# Patient Record
Sex: Female | Born: 1955 | Race: White | Hispanic: No | Marital: Single | Smoking: Never smoker
Health system: Southern US, Community
[De-identification: ages and names within clinical notes are randomized; demographics above are authoritative.]

## PROBLEM LIST (undated history)

## (undated) DIAGNOSIS — M1990 Unspecified osteoarthritis, unspecified site: Secondary | ICD-10-CM

## (undated) DIAGNOSIS — IMO0001 Reserved for inherently not codable concepts without codable children: Secondary | ICD-10-CM

## (undated) DIAGNOSIS — I498 Other specified cardiac arrhythmias: Secondary | ICD-10-CM

## (undated) DIAGNOSIS — G629 Polyneuropathy, unspecified: Secondary | ICD-10-CM

## (undated) DIAGNOSIS — I739 Peripheral vascular disease, unspecified: Secondary | ICD-10-CM

## (undated) DIAGNOSIS — I10 Essential (primary) hypertension: Secondary | ICD-10-CM

## (undated) DIAGNOSIS — G5620 Lesion of ulnar nerve, unspecified upper limb: Secondary | ICD-10-CM

## (undated) DIAGNOSIS — G4730 Sleep apnea, unspecified: Secondary | ICD-10-CM

## (undated) DIAGNOSIS — IMO0002 Reserved for concepts with insufficient information to code with codable children: Secondary | ICD-10-CM

## (undated) DIAGNOSIS — Z8619 Personal history of other infectious and parasitic diseases: Secondary | ICD-10-CM

## (undated) DIAGNOSIS — K3184 Gastroparesis: Secondary | ICD-10-CM

## (undated) DIAGNOSIS — G709 Myoneural disorder, unspecified: Secondary | ICD-10-CM

## (undated) DIAGNOSIS — E785 Hyperlipidemia, unspecified: Secondary | ICD-10-CM

## (undated) DIAGNOSIS — E113299 Type 2 diabetes mellitus with mild nonproliferative diabetic retinopathy without macular edema, unspecified eye: Secondary | ICD-10-CM

## (undated) DIAGNOSIS — G5600 Carpal tunnel syndrome, unspecified upper limb: Secondary | ICD-10-CM

## (undated) HISTORY — PX: BIOPSY THYROID: PRO38

## (undated) HISTORY — PX: TONSILLECTOMY: SUR1361

## (undated) HISTORY — DX: Gastroparesis: K31.84

## (undated) HISTORY — DX: Personal history of other infectious and parasitic diseases: Z86.19

## (undated) HISTORY — DX: Essential (primary) hypertension: I10

## (undated) HISTORY — DX: Polyneuropathy, unspecified: G62.9

## (undated) HISTORY — DX: Unspecified osteoarthritis, unspecified site: M19.90

## (undated) HISTORY — DX: Hyperlipidemia, unspecified: E78.5

## (undated) HISTORY — DX: Type 2 diabetes mellitus with mild nonproliferative diabetic retinopathy without macular edema, unspecified eye: E11.3299

## (undated) HISTORY — DX: Carpal tunnel syndrome, unspecified upper limb: G56.00

## (undated) HISTORY — DX: Lesion of ulnar nerve, unspecified upper limb: G56.20

## (undated) HISTORY — DX: Reserved for concepts with insufficient information to code with codable children: IMO0002

---

## 1997-12-28 ENCOUNTER — Other Ambulatory Visit: Admission: RE | Admit: 1997-12-28 | Discharge: 1997-12-28 | Payer: Self-pay | Admitting: Obstetrics and Gynecology

## 2003-08-31 ENCOUNTER — Ambulatory Visit (HOSPITAL_COMMUNITY): Admission: RE | Admit: 2003-08-31 | Discharge: 2003-08-31 | Payer: Self-pay | Admitting: Internal Medicine

## 2003-09-18 ENCOUNTER — Encounter: Admission: RE | Admit: 2003-09-18 | Discharge: 2003-09-18 | Payer: Self-pay | Admitting: Internal Medicine

## 2004-01-25 ENCOUNTER — Encounter: Admission: RE | Admit: 2004-01-25 | Discharge: 2004-01-25 | Payer: Self-pay | Admitting: Orthopedic Surgery

## 2010-09-23 ENCOUNTER — Encounter: Payer: Self-pay | Admitting: Orthopedic Surgery

## 2012-03-23 ENCOUNTER — Encounter: Payer: Self-pay | Admitting: Cardiovascular Disease

## 2012-03-27 ENCOUNTER — Encounter: Payer: Self-pay | Admitting: Cardiovascular Disease

## 2012-03-27 ENCOUNTER — Ambulatory Visit (INDEPENDENT_AMBULATORY_CARE_PROVIDER_SITE_OTHER): Payer: Self-pay | Admitting: Cardiovascular Disease

## 2012-03-27 VITALS — BP 157/91 | HR 101 | Ht 66.0 in | Wt 291.2 lb

## 2012-03-27 DIAGNOSIS — R0602 Shortness of breath: Secondary | ICD-10-CM | POA: Insufficient documentation

## 2012-03-27 DIAGNOSIS — R55 Syncope and collapse: Secondary | ICD-10-CM | POA: Insufficient documentation

## 2012-03-27 DIAGNOSIS — R002 Palpitations: Secondary | ICD-10-CM

## 2012-03-27 DIAGNOSIS — R609 Edema, unspecified: Secondary | ICD-10-CM | POA: Insufficient documentation

## 2012-03-27 DIAGNOSIS — R079 Chest pain, unspecified: Secondary | ICD-10-CM

## 2012-03-27 NOTE — Assessment & Plan Note (Signed)
Edema likely secondary to venous insufficiency. Unable to exclude elevated right ventricular systolic pressures. Echo pending. Right ventricular systolic pressure is elevated, she would benefit from a diuretic.

## 2012-03-27 NOTE — Patient Instructions (Signed)
You are doing well. No medication changes were made.  We will schedule you for an echocardiogram for dizziness, edema, shortness of breath We will order a holter monitor for 48 hrs for dizziness,  shortness of breath  Please call us if you have new issues that need to be addressed before your next appt.  Your physician wants you to follow-up in:  After the  And holter and echo

## 2012-03-27 NOTE — Progress Notes (Signed)
Patient ID: Linda Briggs, female    DOB: 06-07-1956, 56 y.o.   MRN: [id number]  HPI Comments: Linda Briggs is a very pleasant 56 year old woman with diabetes, obesity, hyperlipidemia, hypertension who presents by referral from Dr. Tanya Nones for symptoms of dizziness.   She reports that her symptoms have been ongoing for at least a year but getting worse recently. Symptoms used to occur once per month and now are happening once every other day. She has had episodes of dizziness when bending over and then she stands up. She has lightheadedness, flushing, a "tunnel vision". She has to sit down and then her symptoms eventually resolved. She has never had syncope but has often felt like she is going to pass out. She has checked her blood pressure, heart rate in sugar when she has these episodes and reports that typically they are within normal limits. She has had some shortness of breath with exertion. Symptoms are worse recently such as when she goes to get the mail. She denies any significant chest pain.   She reports hemoglobin A1c was 11 now down to 9.2.  Blood pressures in the office show blood pressure lying down 137/92, sitting 128/84, standing 137/82, after several minutes 157/91. Interestingly heart rate measurement appeared to be slow after she was standing and then increase to 101 beats per minute after 5 minutes   EKG shows normal sinus rhythm with rate 93 beats per minute with no significant ST or T wave changes   Outpatient Encounter Prescriptions as of 03/27/2012  Medication Sig Dispense Refill  . aspirin 325 MG tablet Take 325 mg by mouth daily.      . Cyanocobalamin (VITAMIN B 12 PO) Take 500 mcg by mouth daily.      Marland Kitchen gabapentin (NEURONTIN) 300 MG capsule Take 300 mg by mouth 3 (three) times daily.      Marland Kitchen glipiZIDE (GLUCOTROL XL) 10 MG 24 hr tablet Take 10 mg by mouth daily.      . insulin glargine (LANTUS) 100 UNIT/ML injection Inject into the skin at bedtime.      Marland Kitchen lisinopril  (PRINIVIL,ZESTRIL) 20 MG tablet Take 20 mg by mouth daily.      . metFORMIN (GLUCOPHAGE) 1000 MG tablet Take 1,000 mg by mouth 2 (two) times daily with a meal.      . Multiple Vitamin (MULTIVITAMIN) tablet Take 1 tablet by mouth daily.      . pravastatin (PRAVACHOL) 40 MG tablet Take 40 mg by mouth daily.        Review of Systems  Constitutional: Negative.   HENT: Negative.   Eyes: Negative.   Respiratory: Positive for shortness of breath.   Cardiovascular: Negative.   Gastrointestinal: Negative.   Musculoskeletal: Negative.   Skin: Negative.   Neurological: Positive for dizziness.  Hematological: Negative.   Psychiatric/Behavioral: Negative.   All other systems reviewed and are negative.    BP 157/91  Pulse 101  Ht 5\' 6"  (1.676 m)  Wt 291 lb 4 oz (132.11 kg)  BMI 47.01 kg/m2  Physical Exam  Nursing note and vitals reviewed. Constitutional: She is oriented to person, place, and time. She appears well-developed and well-nourished.  HENT:  Head: Normocephalic.  Nose: Nose normal.  Mouth/Throat: Oropharynx is clear and moist.  Eyes: Conjunctivae are normal. Pupils are equal, round, and reactive to light.  Neck: Normal range of motion. Neck supple. No JVD present.  Cardiovascular: Normal rate, regular rhythm, S1 normal, S2 normal, normal heart sounds and intact  distal pulses.  Exam reveals no gallop and no friction rub.   No murmur heard. Pulmonary/Chest: Effort normal and breath sounds normal. No respiratory distress. She has no wheezes. She has no rales. She exhibits no tenderness.  Abdominal: Soft. Bowel sounds are normal. She exhibits no distension. There is no tenderness.  Musculoskeletal: Normal range of motion. She exhibits no edema and no tenderness.  Lymphadenopathy:    She has no cervical adenopathy.  Neurological: She is alert and oriented to person, place, and time. Coordination normal.  Skin: Skin is warm and dry. No rash noted. No erythema.  Psychiatric: She  has a normal mood and affect. Her behavior is normal. Judgment and thought content normal.         Assessment and Plan       the

## 2012-03-27 NOTE — Assessment & Plan Note (Signed)
Etiology of her shortness of breath is uncertain. Unable to rule out arrhythmia. Also need to consider ulnar hypertension given her edema. Echocardiogram has been ordered. If this is normal, we could consider a stress test. She has several risk factors including diabetes.

## 2012-03-27 NOTE — Assessment & Plan Note (Signed)
Etiology of her near syncope is uncertain. It is predominantly positional in nature, worse after bending over. We have asked her to closely monitor her blood pressure. Hold for pending to rule out arrhythmia. Echo pending to rule out structural heart disease.

## 2012-03-27 NOTE — Assessment & Plan Note (Signed)
Etiology of her tachycardia is uncertain. Holter pending. Heart rate is elevated at baseline. She may benefit from low-dose beta blocker.

## 2012-03-30 ENCOUNTER — Encounter (INDEPENDENT_AMBULATORY_CARE_PROVIDER_SITE_OTHER): Payer: Self-pay

## 2012-03-30 DIAGNOSIS — R0602 Shortness of breath: Secondary | ICD-10-CM

## 2012-03-30 DIAGNOSIS — R55 Syncope and collapse: Secondary | ICD-10-CM

## 2012-03-30 DIAGNOSIS — R609 Edema, unspecified: Secondary | ICD-10-CM

## 2012-03-30 DIAGNOSIS — R002 Palpitations: Secondary | ICD-10-CM

## 2012-04-07 ENCOUNTER — Other Ambulatory Visit (INDEPENDENT_AMBULATORY_CARE_PROVIDER_SITE_OTHER): Payer: Self-pay

## 2012-04-07 ENCOUNTER — Other Ambulatory Visit: Payer: Self-pay

## 2012-04-07 DIAGNOSIS — R55 Syncope and collapse: Secondary | ICD-10-CM

## 2012-04-07 DIAGNOSIS — R002 Palpitations: Secondary | ICD-10-CM

## 2012-04-07 DIAGNOSIS — R609 Edema, unspecified: Secondary | ICD-10-CM

## 2012-04-07 DIAGNOSIS — R0602 Shortness of breath: Secondary | ICD-10-CM

## 2012-04-13 ENCOUNTER — Ambulatory Visit (INDEPENDENT_AMBULATORY_CARE_PROVIDER_SITE_OTHER): Payer: Self-pay | Admitting: Cardiovascular Disease

## 2012-04-13 ENCOUNTER — Encounter: Payer: Self-pay | Admitting: Cardiovascular Disease

## 2012-04-13 VITALS — BP 148/86 | HR 84 | Ht 66.0 in | Wt 297.0 lb

## 2012-04-13 DIAGNOSIS — R609 Edema, unspecified: Secondary | ICD-10-CM

## 2012-04-13 DIAGNOSIS — R002 Palpitations: Secondary | ICD-10-CM

## 2012-04-13 DIAGNOSIS — R0602 Shortness of breath: Secondary | ICD-10-CM

## 2012-04-13 DIAGNOSIS — R079 Chest pain, unspecified: Secondary | ICD-10-CM

## 2012-04-13 DIAGNOSIS — R55 Syncope and collapse: Secondary | ICD-10-CM

## 2012-04-13 MED ORDER — METOPROLOL TARTRATE 25 MG PO TABS: 25.0000 mg | ORAL_TABLET | Freq: Two times a day (BID) | ORAL | Status: DC

## 2012-04-13 NOTE — Assessment & Plan Note (Signed)
Edema likely from venous insufficiency. She will look at medical supply stores for thigh high compression stockings.

## 2012-04-13 NOTE — Patient Instructions (Addendum)
Please start metoprolol one in the AM and PM for fast heart rate  Take an extra metoprolol as needed  Please call us if you have new issues that need to be addressed before your next appt.  Your physician wants you to follow-up in: 3 months.  You will receive a reminder letter in the mail two months in advance. If you don't receive a letter, please call our office to schedule the follow-up appointment.

## 2012-04-13 NOTE — Assessment & Plan Note (Signed)
Symptoms possibly secondary to tachyarrhythmia. We will start low-dose metoprolol twice a day for symptom release, this can be titrated upwards as needed. 30 day monitor if needed for continued symptoms.

## 2012-04-13 NOTE — Assessment & Plan Note (Signed)
We will start low-dose metoprolol tartrate twice a day for elevated heart rate and tachycardia. If symptoms do not significantly improve, we will order a 30 day monitor

## 2012-04-13 NOTE — Assessment & Plan Note (Signed)
Shortness of breath could be secondary to baseline tachycardia with minimal exertion.

## 2012-04-13 NOTE — Progress Notes (Signed)
Patient ID: Linda Briggs, female    DOB: 07-Nov-1955, 56 y.o.   MRN: [id number]  HPI Comments: Linda Briggs is a very pleasant 56 year old woman with diabetes, obesity, hyperlipidemia, hypertension, patient of Dr. Tanya Nones, with symptoms of dizziness. Recently started on insulin, also with chronic lower extremity edema.  She reports that she continues to have symptoms of dizziness that come episodically. Shortness of breath with exertion, periods of palpitations.   She wore the Holter monitor that showed elevated baseline heart rate with frequent periods of sinus tachycardia.  she reports that she was relatively asymptomatic while wearing the monitor as she did not have some of her usual episodes of dizziness and fluttering.  She reports hemoglobin A1c was 11 now down to 9.2. She has been recently started on insulin and reports even better sugar levels She has neuropathy in her feet.   EKG shows normal sinus rhythm with rate 84 beats per minute with no significant ST or T wave changes   Outpatient Encounter Prescriptions as of 04/13/2012  Medication Sig Dispense Refill  . aspirin 325 MG tablet Take 325 mg by mouth daily.      . Cyanocobalamin (VITAMIN B 12 PO) Take 500 mcg by mouth daily.      Marland Kitchen gabapentin (NEURONTIN) 300 MG capsule Take 300 mg by mouth 3 (three) times daily.      Marland Kitchen glipiZIDE (GLUCOTROL XL) 10 MG 24 hr tablet Take 10 mg by mouth daily.      . insulin glargine (LANTUS) 100 UNIT/ML injection Inject 40 Units into the skin at bedtime.       Marland Kitchen lisinopril (PRINIVIL,ZESTRIL) 20 MG tablet Take 20 mg by mouth daily.      . metFORMIN (GLUCOPHAGE) 1000 MG tablet Take 1,000 mg by mouth 2 (two) times daily with a meal.      . Multiple Vitamin (MULTIVITAMIN) tablet Take 1 tablet by mouth daily.      . pravastatin (PRAVACHOL) 40 MG tablet Take 40 mg by mouth daily.      . metoprolol tartrate (LOPRESSOR) 25 MG tablet Take 1 tablet (25 mg total) by mouth 2 (two) times daily.  60 tablet  6     Review of Systems  Constitutional: Negative.   HENT: Negative.   Eyes: Negative.   Respiratory: Positive for shortness of breath.   Cardiovascular: Negative.   Gastrointestinal: Negative.   Musculoskeletal: Negative.   Skin: Negative.   Neurological: Positive for dizziness.  Hematological: Negative.   Psychiatric/Behavioral: Negative.   All other systems reviewed and are negative.    BP 148/86  Pulse 84  Ht 5\' 6"  (1.676 m)  Wt 297 lb (134.718 kg)  BMI 47.94 kg/m2  Physical Exam  Nursing note and vitals reviewed. Constitutional: She is oriented to person, place, and time. She appears well-developed and well-nourished.  HENT:  Head: Normocephalic.  Nose: Nose normal.  Mouth/Throat: Oropharynx is clear and moist.  Eyes: Conjunctivae are normal. Pupils are equal, round, and reactive to light.  Neck: Normal range of motion. Neck supple. No JVD present.  Cardiovascular: Normal rate, regular rhythm, S1 normal, S2 normal, normal heart sounds and intact distal pulses.  Exam reveals no gallop and no friction rub.   No murmur heard. Pulmonary/Chest: Effort normal and breath sounds normal. No respiratory distress. She has no wheezes. She has no rales. She exhibits no tenderness.  Abdominal: Soft. Bowel sounds are normal. She exhibits no distension. There is no tenderness.  Musculoskeletal: Normal range of motion.  She exhibits no edema and no tenderness.  Lymphadenopathy:    She has no cervical adenopathy.  Neurological: She is alert and oriented to person, place, and time. Coordination normal.  Skin: Skin is warm and dry. No rash noted. No erythema.  Psychiatric: She has a normal mood and affect. Her behavior is normal. Judgment and thought content normal.         Assessment and Plan

## 2012-07-13 ENCOUNTER — Ambulatory Visit: Payer: Self-pay | Admitting: Cardiovascular Disease

## 2012-08-19 ENCOUNTER — Ambulatory Visit: Payer: Self-pay | Admitting: Cardiovascular Disease

## 2012-09-07 ENCOUNTER — Encounter: Payer: Self-pay | Admitting: Cardiovascular Disease

## 2012-09-07 ENCOUNTER — Ambulatory Visit (INDEPENDENT_AMBULATORY_CARE_PROVIDER_SITE_OTHER): Payer: Self-pay | Admitting: Cardiovascular Disease

## 2012-09-07 VITALS — BP 146/70 | HR 94 | Ht 66.0 in | Wt 299.0 lb

## 2012-09-07 DIAGNOSIS — R0602 Shortness of breath: Secondary | ICD-10-CM

## 2012-09-07 DIAGNOSIS — R079 Chest pain, unspecified: Secondary | ICD-10-CM

## 2012-09-07 DIAGNOSIS — R609 Edema, unspecified: Secondary | ICD-10-CM

## 2012-09-07 DIAGNOSIS — R002 Palpitations: Secondary | ICD-10-CM

## 2012-09-07 MED ORDER — POTASSIUM CHLORIDE ER 10 MEQ PO TBCR: 10.0000 meq | EXTENDED_RELEASE_TABLET | Freq: Two times a day (BID) | ORAL | Status: DC | PRN

## 2012-09-07 MED ORDER — FUROSEMIDE 20 MG PO TABS: 20.0000 mg | ORAL_TABLET | Freq: Two times a day (BID) | ORAL | Status: DC | PRN

## 2012-09-07 NOTE — Assessment & Plan Note (Signed)
Of shortness of breath, possible diastolic dysfunction/diastolic CHF. Worsening edema. We will start low-dose diuretic.

## 2012-09-07 NOTE — Patient Instructions (Addendum)
You are doing well. Try 1 1/2 or  two metoprolol in the Am, one in the PM to control the heart rate  Take lasix up to twice a day with potassium for swelling /fluid retention   Please call us if you have new issues that need to be addressed before your next appt.  Your physician wants you to follow-up in: 6 months.  You will receive a reminder letter in the mail two months in advance. If you don't receive a letter, please call our office to schedule the follow-up appointment.

## 2012-09-07 NOTE — Assessment & Plan Note (Signed)
Improvement in her palpitations and fluttering and dizziness on metoprolol 25 mg twice a day. We have suggested she could increase the dose to 37.5 mg twice a day, possibly titrating up to 50 mg for better rate control.

## 2012-09-07 NOTE — Assessment & Plan Note (Signed)
Worsening edema through the holiday. We will start her on Lasix with low-dose potassium for increasing tightness in the feet and ankles. We have asked her to watch her salt and fluid intake. She does report having significant fluids and eating out.

## 2012-09-07 NOTE — Progress Notes (Signed)
Patient ID: Linda Briggs, female    DOB: 1955/12/29, 57 y.o.   MRN: [id number]  HPI Comments: Linda Briggs is a very pleasant 57 year old woman with diabetes, obesity, hyperlipidemia, hypertension, patient of Dr. Tanya Nones, with previous symptoms of dizziness.  on insulin, also with chronic lower extremity edema.  Overall she reports doing well though her edema has been worse. She has a difficult time putting her shoes on through the holiday time. The tops of her feet and toes are shiny and tight.  She does report hemoglobin A1c has been slowly decreasing, initially 11, down to 9, most recently 8 She has not been taking pravastatin on a regular basis  She wore  Holter monitor in the past that showed elevated baseline heart rate with frequent periods of sinus tachycardia. Improvement in dizzy episodes and fluttering with metoprolol. She has neuropathy in her feet.   EKG shows normal sinus rhythm with rate 94 beats per minute with no significant ST or T wave changes   Outpatient Encounter Prescriptions as of 09/07/2012  Medication Sig Dispense Refill  . aspirin 325 MG tablet Take 325 mg by mouth daily.      . Cyanocobalamin (VITAMIN B 12 PO) Take 500 mcg by mouth daily.      Marland Kitchen gabapentin (NEURONTIN) 300 MG capsule Take 300 mg by mouth 3 (three) times daily.      Marland Kitchen glipiZIDE (GLUCOTROL XL) 10 MG 24 hr tablet Take 10 mg by mouth daily.      . insulin NPH-insulin regular (NOVOLIN 70/30) (70-30) 100 UNIT/ML injection Inject 28 Units into the skin daily with breakfast. 14 units in the pm      . lisinopril (PRINIVIL,ZESTRIL) 20 MG tablet Take 20 mg by mouth daily.      . metFORMIN (GLUCOPHAGE) 1000 MG tablet Take 1,000 mg by mouth 2 (two) times daily with a meal.      . metoprolol tartrate (LOPRESSOR) 25 MG tablet Take 1 tablet (25 mg total) by mouth 2 (two) times daily.  60 tablet  6  . Multiple Vitamin (MULTIVITAMIN) tablet Take 1 tablet by mouth daily.      . pravastatin (PRAVACHOL) 80 MG tablet  Take 80 mg by mouth daily.      . traMADol (ULTRAM) 50 MG tablet Take 50 mg by mouth every 6 (six) hours as needed.      . furosemide (LASIX) 20 MG tablet Take 1 tablet (20 mg total) by mouth 2 (two) times daily as needed.  60 tablet  6  . potassium chloride (K-DUR) 10 MEQ tablet Take 1 tablet (10 mEq total) by mouth 2 (two) times daily as needed.  60 tablet  6  . [DISCONTINUED] insulin glargine (LANTUS) 100 UNIT/ML injection Inject 40 Units into the skin at bedtime.       . [DISCONTINUED] pravastatin (PRAVACHOL) 40 MG tablet Take 40 mg by mouth daily.        Review of Systems  Constitutional: Negative.   HENT: Negative.   Eyes: Negative.   Respiratory: Positive for shortness of breath.   Cardiovascular: Positive for leg swelling.  Gastrointestinal: Negative.   Musculoskeletal: Negative.   Skin: Negative.   Hematological: Negative.   Psychiatric/Behavioral: Negative.   All other systems reviewed and are negative.    BP 146/70  Pulse 94  Ht 5\' 6"  (1.676 m)  Wt 299 lb (135.626 kg)  BMI 48.26 kg/m2  Physical Exam  Nursing note and vitals reviewed. Constitutional: She is oriented to person,  place, and time. She appears well-developed and well-nourished.  HENT:  Head: Normocephalic.  Nose: Nose normal.  Mouth/Throat: Oropharynx is clear and moist.  Eyes: Conjunctivae normal are normal. Pupils are equal, round, and reactive to light.  Neck: Normal range of motion. Neck supple. No JVD present.  Cardiovascular: Normal rate, regular rhythm, S1 normal, S2 normal, normal heart sounds and intact distal pulses.  Exam reveals no gallop and no friction rub.   No murmur heard.      Woody edema bilaterally below the knees, trace to 1+ pitting  Pulmonary/Chest: Effort normal and breath sounds normal. No respiratory distress. She has no wheezes. She has no rales. She exhibits no tenderness.  Abdominal: Soft. Bowel sounds are normal. She exhibits no distension. There is no tenderness.    Musculoskeletal: Normal range of motion. She exhibits no edema and no tenderness.  Lymphadenopathy:    She has no cervical adenopathy.  Neurological: She is alert and oriented to person, place, and time. Coordination normal.  Skin: Skin is warm and dry. No rash noted. No erythema.  Psychiatric: She has a normal mood and affect. Her behavior is normal. Judgment and thought content normal.         Assessment and Plan

## 2013-01-11 ENCOUNTER — Ambulatory Visit: Payer: Self-pay

## 2013-03-15 ENCOUNTER — Other Ambulatory Visit: Payer: Self-pay | Admitting: Family Medicine

## 2013-04-06 ENCOUNTER — Telehealth: Payer: Self-pay | Admitting: *Deleted

## 2013-04-06 NOTE — Telephone Encounter (Signed)
Lmom to call [facility] need to r/s an overdue appt with Dr. Mariah Milling.

## 2013-04-12 ENCOUNTER — Ambulatory Visit: Payer: Self-pay | Admitting: Family Medicine

## 2013-04-26 ENCOUNTER — Ambulatory Visit (INDEPENDENT_AMBULATORY_CARE_PROVIDER_SITE_OTHER): Payer: Self-pay | Admitting: Family Medicine

## 2013-04-26 ENCOUNTER — Other Ambulatory Visit: Payer: Self-pay | Admitting: Family Medicine

## 2013-04-26 ENCOUNTER — Encounter: Payer: Self-pay | Admitting: Family Medicine

## 2013-04-26 VITALS — BP 122/76 | HR 84 | Temp 98.4°F | Resp 18 | Wt 308.0 lb

## 2013-04-26 DIAGNOSIS — E785 Hyperlipidemia, unspecified: Secondary | ICD-10-CM

## 2013-04-26 DIAGNOSIS — R002 Palpitations: Secondary | ICD-10-CM

## 2013-04-26 DIAGNOSIS — I10 Essential (primary) hypertension: Secondary | ICD-10-CM

## 2013-04-26 DIAGNOSIS — IMO0002 Reserved for concepts with insufficient information to code with codable children: Secondary | ICD-10-CM

## 2013-04-26 DIAGNOSIS — E1165 Type 2 diabetes mellitus with hyperglycemia: Secondary | ICD-10-CM

## 2013-04-26 LAB — HEMOGLOBIN A1C
Hgb A1c MFr Bld: 8.1 % — ABNORMAL HIGH (ref <5.7)
Mean Plasma Glucose: 186 mg/dL — ABNORMAL HIGH (ref <117)

## 2013-04-26 LAB — CBC WITH DIFFERENTIAL/PLATELET
Basophils Absolute: 0.1 10*3/uL (ref 0.0–0.1)
Basophils Relative: 1 % (ref 0–1)
Eosinophils Absolute: 0.2 10*3/uL (ref 0.0–0.7)
Eosinophils Relative: 2 % (ref 0–5)
HCT: 40.1 % (ref 36.0–46.0)
Hemoglobin: 14 g/dL (ref 12.0–15.0)
Lymphocytes Relative: 22 % (ref 12–46)
Lymphs Abs: 2.4 10*3/uL (ref 0.7–4.0)
MCH: 32.3 pg (ref 26.0–34.0)
MCHC: 34.9 g/dL (ref 30.0–36.0)
MCV: 92.6 fL (ref 78.0–100.0)
Monocytes Absolute: 0.5 10*3/uL (ref 0.1–1.0)
Monocytes Relative: 5 % (ref 3–12)
Neutro Abs: 7.7 10*3/uL (ref 1.7–7.7)
Neutrophils Relative %: 70 % (ref 43–77)
Platelets: 180 10*3/uL (ref 150–400)
RBC: 4.33 MIL/uL (ref 3.87–5.11)
RDW: 14 % (ref 11.5–15.5)
WBC: 10.9 10*3/uL — ABNORMAL HIGH (ref 4.0–10.5)

## 2013-04-26 LAB — COMPLETE METABOLIC PANEL WITH GFR
ALT: 21 U/L (ref 0–35)
AST: 20 U/L (ref 0–37)
Albumin: 3.9 g/dL (ref 3.5–5.2)
Alkaline Phosphatase: 83 U/L (ref 39–117)
BUN: 15 mg/dL (ref 6–23)
CO2: 28 mEq/L (ref 19–32)
Calcium: 9.3 mg/dL (ref 8.4–10.5)
Chloride: 100 mEq/L (ref 96–112)
Creat: 0.76 mg/dL (ref 0.50–1.10)
GFR, Est African American: >89 mL/min
GFR, Est Non African American: 89 mL/min
Glucose, Bld: 181 mg/dL — ABNORMAL HIGH (ref 70–99)
Potassium: 4.4 mEq/L (ref 3.5–5.3)
Sodium: 138 mEq/L (ref 135–145)
Total Bilirubin: 0.8 mg/dL (ref 0.3–1.2)
Total Protein: 6.8 g/dL (ref 6.0–8.3)

## 2013-04-26 MED ORDER — TRAMADOL HCL 50 MG PO TABS: 50.0000 mg | ORAL_TABLET | Freq: Four times a day (QID) | ORAL | Status: DC | PRN

## 2013-04-26 NOTE — Progress Notes (Signed)
Subjective:    Patient ID: Linda Briggs, female    DOB: 1955/11/22, 57 y.o.   MRN: [id number]  HPI  Position of her followup of her diabetes. She is currently on glipizide XL 10 mg by mouth daily, metformin 1000 mg by mouth twice a day, and NPH 70/30 35 units in the morning and 18 units at night. She reports fasting blood sugars typically between 101 170. She denies any episodes of hypoglycemia. She denies any polyuria, polydipsia, or blurred vision. She also has hypertension. She is going on Lopressor 25 mg by mouth twice a day, lisinopril 20 mg by mouth daily, and Lasix 20 mg by mouth daily. She denies any chest pain, shortness of breath, dyspnea on exertion. She also has hyperlipidemia issues going on pravastatin 40 mg by mouth daily. She denies any myalgia or right upper quadrant pain. She has chronic low back pain. Past Medical History  Diagnosis Date  . Diabetes mellitus   . History of hepatitis B   . Neuropathy     feet  . Bulging disc   . Arthritis     feet & toes  . Hyperlipidemia   . Hypertension    Past Surgical History  Procedure Laterality Date  . Tonsillectomy     Current Outpatient Prescriptions on File Prior to Visit  Medication Sig Dispense Refill  . aspirin 325 MG tablet Take 325 mg by mouth daily.      . Cyanocobalamin (VITAMIN B 12 PO) Take 500 mcg by mouth daily.      . furosemide (LASIX) 20 MG tablet Take 1 tablet (20 mg total) by mouth 2 (two) times daily as needed.  60 tablet  6  . gabapentin (NEURONTIN) 300 MG capsule Take 300 mg by mouth 3 (three) times daily.      Marland Kitchen glipiZIDE (GLUCOTROL XL) 10 MG 24 hr tablet TAKE 1 TABLET BY MOUTH ONCE A DAY  30 tablet  3  . insulin NPH-insulin regular (NOVOLIN 70/30) (70-30) 100 UNIT/ML injection Inject 35 Units into the skin daily with breakfast. 18 units in the pm      . lisinopril (PRINIVIL,ZESTRIL) 20 MG tablet TAKE 1 TABLET BY MOUTH ONCE A DAY  30 tablet  3  . metFORMIN (GLUCOPHAGE) 1000 MG tablet TAKE 1 TABLET BY  MOUTH TWICE A DAY  60 tablet  3  . Multiple Vitamin (MULTIVITAMIN) tablet Take 1 tablet by mouth daily.      . pravastatin (PRAVACHOL) 80 MG tablet Take 80 mg by mouth daily.      . traMADol (ULTRAM) 50 MG tablet Take 50 mg by mouth every 6 (six) hours as needed.       No current facility-administered medications on file prior to visit.   Allergies  Allergen Reactions  . Codeine   . Januvia [Sitagliptin]     Stomach cramps  . Penicillins    History   Social History  . Marital Status: Married    Spouse Name: N/A    Number of Children: N/A  . Years of Education: N/A   Occupational History  . Not on file.   Social History Main Topics  . Smoking status: Never Smoker   . Smokeless tobacco: Never Used  . Alcohol Use: 0.0 oz/week     Comment: social drinker.  . Drug Use: No  . Sexual Activity: Not on file   Other Topics Concern  . Not on file   Social History Narrative  . No narrative on file  Family History  Problem Relation Age of Onset  . Hyperlipidemia Mother   . Heart disease Father   . Hyperlipidemia Father   . Hypertension Father   . Heart disease Brother   . Stroke Brother      Review of Systems  All other systems reviewed and are negative.       Objective:   Physical Exam  Constitutional: She appears well-developed and well-nourished.  Eyes: Conjunctivae are normal. Pupils are equal, round, and reactive to light.  Neck: Neck supple. No JVD present. No thyromegaly present.  Cardiovascular: S1 normal, S2 normal and intact distal pulses.  An irregular rhythm present. PMI is not displaced.  Exam reveals no gallop.   Murmur heard.  Systolic murmur is present with a grade of 1/6  Pulmonary/Chest: Breath sounds normal. No respiratory distress. She has no wheezes. She has no rales. She exhibits no tenderness.  Abdominal: Soft. Bowel sounds are normal. She exhibits no distension. There is no tenderness. There is no rebound and no guarding.  Musculoskeletal:  She exhibits edema.  Lymphadenopathy:    She has no cervical adenopathy.  Skin: No rash noted.          Assessment & Plan:  1. Palpitations Patient's exam today showed an irregular heartbeat. Cannot discern whether they are PACs/PVCs versus atrial fibrillation. Therefore I obtained an EKG. EKG today in the office shows normal sinus rhythm 88 beats per minute with left axis deviation 28. There no intervals. She has nonspecific ST changes in aVL. Otherwise EKG is essentially normal. There is no evidence of a fibrillation. - EKG 12-Lead  2. Type II or unspecified type diabetes mellitus with unspecified complication, uncontrolled Check the patient's hemoglobin A1c. I recommended an annual eye exam. She is going on aspirin therapy. Also check a fasting lipid panel. LDL is less than 100. I like her hemoglobin A1c less than 7. Check that today. I recommended a low carbohydrate diet and increasing aerobic exercise. - COMPLETE METABOLIC PANEL WITH GFR - CBC with Differential - Hemoglobin A1c - Lipid panel; Future  3. HTN (hypertension) Pressures well controlled. Continue current medications at the present dosages.  4. HLD (hyperlipidemia) Return fasting for a fasting lipid panel. Goal LDL is less than 100. - Lipid panel; Future  Patient declines a colonoscopy due to a previous family history of perforated viscus. She will consider it but she's not interested at the present time.

## 2013-04-29 LAB — LIPID PANEL
Cholesterol: 184 mg/dL (ref 0–200)
HDL: 27 mg/dL — ABNORMAL LOW (ref >39)
LDL Cholesterol: 111 mg/dL — ABNORMAL HIGH (ref 0–99)
Total CHOL/HDL Ratio: 6.8 Ratio
Triglycerides: 229 mg/dL — ABNORMAL HIGH (ref <150)
VLDL: 46 mg/dL — ABNORMAL HIGH (ref 0–40)

## 2013-04-30 ENCOUNTER — Telehealth: Payer: Self-pay | Admitting: Family Medicine

## 2013-04-30 NOTE — Telephone Encounter (Signed)
Patient dose not want to try Lipitor.  Will stay on Pravstatin 80mg  at bedtime.  Reinforce watch diet (carbs and saturated fats)  Sending carb counting info sheet.  Has 3 mth appt for early December

## 2013-04-30 NOTE — Telephone Encounter (Signed)
Message copied by Donne Anon on Fri Apr 30, 2013 10:26 AM ------      Message from: Lynnea Ferrier      Created: Fri Apr 30, 2013  7:41 AM       LDL is 111, goal less than 100.  WOuld she be willing to switch pravastatin 80 qd to lipitor 40 qd and recheck in 3 months. ------

## 2013-05-17 ENCOUNTER — Telehealth: Payer: Self-pay | Admitting: Family Medicine

## 2013-05-17 NOTE — Telephone Encounter (Signed)
Date Fasting After Meal 8/28 155  175 8/29 212  145 8/30 152  182  8/31 137  285 9/1 136  249 9/2 179  234 9/3 192  Forgot to take 9/4 186  205

## 2013-05-18 NOTE — Telephone Encounter (Signed)
Patient aware.

## 2013-05-18 NOTE — Telephone Encounter (Signed)
Increase morning 70/30 to 50 units (verify she is taking 40 first).  Recheck sugars in 1 week.

## 2013-05-25 ENCOUNTER — Telehealth: Payer: Self-pay | Admitting: Family Medicine

## 2013-05-25 NOTE — Telephone Encounter (Signed)
Insulin 100 u/mL injection

## 2013-05-26 ENCOUNTER — Other Ambulatory Visit: Payer: Self-pay | Admitting: Family Medicine

## 2013-05-26 MED ORDER — INSULIN NPH ISOPHANE & REGULAR (70-30) 100 UNIT/ML ~~LOC~~ SUSP: SUBCUTANEOUS | Status: DC

## 2013-05-26 NOTE — Telephone Encounter (Signed)
[facility] (need to know how much she is taking so I can dispense correct amount)

## 2013-05-26 NOTE — Telephone Encounter (Signed)
Rx Refilled  

## 2013-05-26 NOTE — Telephone Encounter (Signed)
Pt has not been eating right or low carb but has been checking BS. Here are her readings for the past few days.. She is on Novolin 70/30 50u qam & 20u qpm  FBS - 05/20/13 - 05/26/13 - 205, 202, 153, 183, 175, 180, 210. After meal - 05/20/13 - 05/26/13 - 262, 236, 262, 199, 239, 208, 142.

## 2013-05-27 NOTE — Telephone Encounter (Signed)
Increase both to 55 in am and 25 in pm and recheck in 1 week.  Try to adhere to low carb diet. (45 g per meal).

## 2013-05-28 NOTE — Telephone Encounter (Signed)
.  Patient aware and will call back in a week with blood sugar readings

## 2013-05-28 NOTE — Telephone Encounter (Signed)
LMTRC

## 2013-06-08 ENCOUNTER — Telehealth: Payer: Self-pay | Admitting: Family Medicine

## 2013-06-08 NOTE — Telephone Encounter (Signed)
Patient is wanting to let you know what her BS. Readings.   05-27-2013 to 06-02-2013   Fasting            174 113 155 136 125 145 147  After meals 219 194 229 187 230 202 200

## 2013-06-09 NOTE — Telephone Encounter (Signed)
For your review

## 2013-06-11 NOTE — Telephone Encounter (Signed)
Patient aware.

## 2013-06-11 NOTE — Telephone Encounter (Signed)
Increase 70/30 to 60 AM and 25 PM and report sugars in 1 week.

## 2013-06-21 ENCOUNTER — Other Ambulatory Visit: Payer: Self-pay | Admitting: Family Medicine

## 2013-06-21 NOTE — Telephone Encounter (Signed)
Medication refilled per protocol. 

## 2013-06-24 ENCOUNTER — Telehealth: Payer: Self-pay | Admitting: Family Medicine

## 2013-06-24 NOTE — Telephone Encounter (Signed)
Patient Calling in on her BS reading. Patient also wants to know if you can suggest an Eye Doctor for checking for Diabetes .    FBS                      After Meals 06-12-2013          06-12-2013 124                        186 10-12-204            06-13-2013 141                          189 06-14-2013         06-14-2013 126                           197 10-14-135           06-15-2013    [place] 135                            217 06-16-2013         06-16-2013 113                            168  06-17-2013         06-17-2013 125                             172 06-18-2013         06-18-2013     [place] 127                            201

## 2013-06-24 NOTE — Telephone Encounter (Signed)
FBS

## 2013-06-25 NOTE — Telephone Encounter (Signed)
fastings are good, 2 hr post prandials are getting better.  Increase insulin to 65 in AM and 30 in PM and hopefully that should do it.   I recommend Dr. Elmer Picker.

## 2013-06-25 NOTE — Telephone Encounter (Signed)
Pt aware of message

## 2013-07-12 ENCOUNTER — Ambulatory Visit: Payer: Self-pay | Admitting: Cardiovascular Disease

## 2013-07-13 ENCOUNTER — Telehealth: Payer: Self-pay | Admitting: Family Medicine

## 2013-07-13 ENCOUNTER — Other Ambulatory Visit: Payer: Self-pay | Admitting: Family Medicine

## 2013-07-13 NOTE — Telephone Encounter (Signed)
FBS - 110, 130, 110, 113, 115, 103, 111 After meals - 181, 187, 172, 167, 173, 159, 167  Per [facility] all these look good no change in medication.  .Patient aware

## 2013-07-26 ENCOUNTER — Encounter: Payer: Self-pay | Admitting: Cardiovascular Disease

## 2013-07-26 ENCOUNTER — Ambulatory Visit (INDEPENDENT_AMBULATORY_CARE_PROVIDER_SITE_OTHER): Payer: Self-pay | Admitting: Cardiovascular Disease

## 2013-07-26 VITALS — BP 120/72 | HR 71 | Ht 66.0 in | Wt 314.0 lb

## 2013-07-26 DIAGNOSIS — I4892 Unspecified atrial flutter: Secondary | ICD-10-CM

## 2013-07-26 DIAGNOSIS — R531 Weakness: Secondary | ICD-10-CM | POA: Insufficient documentation

## 2013-07-26 DIAGNOSIS — I10 Essential (primary) hypertension: Secondary | ICD-10-CM

## 2013-07-26 DIAGNOSIS — E785 Hyperlipidemia, unspecified: Secondary | ICD-10-CM

## 2013-07-26 DIAGNOSIS — R0602 Shortness of breath: Secondary | ICD-10-CM

## 2013-07-26 DIAGNOSIS — R5381 Other malaise: Secondary | ICD-10-CM

## 2013-07-26 DIAGNOSIS — R609 Edema, unspecified: Secondary | ICD-10-CM

## 2013-07-26 DIAGNOSIS — R079 Chest pain, unspecified: Secondary | ICD-10-CM

## 2013-07-26 MED ORDER — METOPROLOL TARTRATE 50 MG PO TABS: 50.0000 mg | ORAL_TABLET | Freq: Two times a day (BID) | ORAL | Status: DC

## 2013-07-26 NOTE — Patient Instructions (Signed)
You are doing well. No medication changes were made.  Ok to take extra metoprolol in the evening for palpitations  Please call us if you have new issues that need to be addressed before your next appt.  Your physician wants you to follow-up in: 6 months.  You will receive a reminder letter in the mail two months in advance. If you don't receive a letter, please call our office to schedule the follow-up appointment.

## 2013-07-26 NOTE — Assessment & Plan Note (Signed)
Cholesterol above goal. We have encouraged her to work on her diet and weight loss, continue her statin

## 2013-07-26 NOTE — Assessment & Plan Note (Signed)
Episodes of weakness, feeling that she is having an out of body experience, 2 times per month. Etiology not clear. She does not feel it is low sugars. Suggested she monitor her heart rate and blood pressure for now when she has these episodes. If these are stable, etiology is not clear.  Could consider cardiac Holter monitor today or 30 day if there is bradycardia. Linda Briggs

## 2013-07-26 NOTE — Assessment & Plan Note (Signed)
Blood pressure is well controlled on today's visit. No changes made to the medications. 

## 2013-07-26 NOTE — Progress Notes (Signed)
Patient ID: Linda Briggs, female    DOB: 1956/03/11, 57 y.o.   MRN: [id number]  HPI Comments: Linda Briggs is a very pleasant 57 year old woman with poorly controlled diabetes, morbid obesity, hyperlipidemia, hypertension, patient of Dr. Tanya Nones, with previous symptoms of dizziness.  on insulin, also with chronic lower extremity edema. She presents for routine followup  Overall she reports doing well. Edema slightly worse recently. She takes Lasix 20 mg daily. Blood work in August 2014 was essentially normal with good renal function .  Symptoms are worse without Lasix. Sometimes she feels she needs Lasix twice a day. She does not do regular exercise program. She does report that 2 times per month, she feels that she has an "out of body experience", feels weak, has to lay down for 10 minutes and then symptoms resolve. She does not feel that it is her sugars. She does not know her heart rate or blood pressure during these episodes. She does have occasional palpitations late in the evening. Rarely she takes extra half metoprolol   hemoglobin A1c most recently 8, total cholesterol 180, LDL 111 In the past, she was not taking pravastatin on a regular basis  She wore  Holter monitor in the past that showed elevated baseline heart rate with frequent periods of sinus tachycardia. Improvement in dizzy episodes and fluttering with metoprolol. She has neuropathy in her feet.   EKG shows normal sinus rhythm with rate 71 beats per minute with no significant ST or T wave changes   Outpatient Encounter Prescriptions as of 07/26/2013  Medication Sig  . aspirin 325 MG tablet Take 325 mg by mouth daily.  . Cyanocobalamin (VITAMIN B 12 PO) Take 500 mcg by mouth daily.  . furosemide (LASIX) 20 MG tablet Take 1 tablet (20 mg total) by mouth 2 (two) times daily as needed.  . gabapentin (NEURONTIN) 300 MG capsule TAKE 1 CAPSULE BY MOUTH 3 TIMES A DAY  . glipiZIDE (GLUCOTROL XL) 10 MG 24 hr tablet TAKE 1 TABLET  BY MOUTH ONCE A DAY  . insulin NPH-regular (NOVOLIN 70/30) (70-30) 100 UNIT/ML injection 65 units am & 35 units pm daily.  Marland Kitchen lisinopril (PRINIVIL,ZESTRIL) 20 MG tablet TAKE 1 TABLET BY MOUTH ONCE A DAY  . metFORMIN (GLUCOPHAGE) 1000 MG tablet TAKE 1 TABLET BY MOUTH TWICE A DAY  . metoprolol tartrate (LOPRESSOR) 25 MG tablet 50 mg qam & 25mg  qpm  . Multiple Vitamin (MULTIVITAMIN) tablet Take 1 tablet by mouth daily.  . potassium chloride (K-DUR) 10 MEQ tablet Take 10 mEq by mouth daily. Or BID prn swelling  . pravastatin (PRAVACHOL) 80 MG tablet Take 80 mg by mouth daily.  . traMADol (ULTRAM) 50 MG tablet Take 1 tablet (50 mg total) by mouth every 6 (six) hours as needed.  . [DISCONTINUED] insulin NPH-regular (NOVOLIN 70/30) (70-30) 100 UNIT/ML injection 50 units qam & 20 units qpm    Review of Systems  Constitutional: Negative.   HENT: Negative.   Eyes: Negative.   Cardiovascular: Positive for leg swelling.  Gastrointestinal: Negative.   Musculoskeletal: Negative.   Skin: Negative.   Psychiatric/Behavioral: Negative.   All other systems reviewed and are negative.    BP 120/72  Pulse 71  Ht 5\' 6"  (1.676 m)  Wt 314 lb (142.429 kg)  BMI 50.70 kg/m2  Physical Exam  Nursing note and vitals reviewed. Constitutional: She is oriented to person, place, and time. She appears well-developed and well-nourished.  HENT:  Head: Normocephalic.  Nose: Nose normal.  Mouth/Throat: Oropharynx is clear and moist.  Eyes: Conjunctivae are normal. Pupils are equal, round, and reactive to light.  Neck: Normal range of motion. Neck supple. No JVD present.  Cardiovascular: Normal rate, regular rhythm, S1 normal, S2 normal, normal heart sounds and intact distal pulses.  Exam reveals no gallop and no friction rub.   No murmur heard. Woody edema bilaterally below the knees, trace  pitting  Pulmonary/Chest: Effort normal and breath sounds normal. No respiratory distress. She has no wheezes. She has no  rales. She exhibits no tenderness.  Abdominal: Soft. Bowel sounds are normal. She exhibits no distension. There is no tenderness.  Musculoskeletal: Normal range of motion. She exhibits no edema and no tenderness.  Lymphadenopathy:    She has no cervical adenopathy.  Neurological: She is alert and oriented to person, place, and time. Coordination normal.  Skin: Skin is warm and dry. No rash noted. No erythema.  Psychiatric: She has a normal mood and affect. Her behavior is normal. Judgment and thought content normal.    Assessment and Plan

## 2013-07-26 NOTE — Assessment & Plan Note (Signed)
I suspect she has a component of chronic diastolic CHF, improved with Lasix. Given normal renal function August 2014, okay to take Lasix in the evening when necessary for worsening edema. Did not appear to have problems with potassium. Also has a component of venous insufficiency, worse when her legs are down for prolonged periods. Encouraged her to wear compression hose with Velcro straps. She reports that she has a pair.

## 2013-07-26 NOTE — Assessment & Plan Note (Signed)
Mild shortness of breath at baseline, likely from diastolic CHF, deconditioning and obesity

## 2013-08-02 ENCOUNTER — Ambulatory Visit: Payer: Self-pay | Admitting: Family Medicine

## 2013-08-16 ENCOUNTER — Encounter: Payer: Self-pay | Admitting: Family Medicine

## 2013-08-16 ENCOUNTER — Ambulatory Visit (INDEPENDENT_AMBULATORY_CARE_PROVIDER_SITE_OTHER): Payer: Self-pay | Admitting: Family Medicine

## 2013-08-16 ENCOUNTER — Telehealth: Payer: Self-pay | Admitting: Family Medicine

## 2013-08-16 VITALS — BP 120/80 | HR 98 | Temp 97.1°F | Resp 18 | Wt 314.0 lb

## 2013-08-16 LAB — COMPLETE METABOLIC PANEL WITH GFR
ALT: 19 U/L (ref 0–35)
Albumin: 4.1 g/dL (ref 3.5–5.2)
Alkaline Phosphatase: 87 U/L (ref 39–117)
CO2: 25 mEq/L (ref 19–32)
Creat: 0.79 mg/dL (ref 0.50–1.10)
GFR, Est African American: >89 mL/min
GFR, Est Non African American: 84 mL/min
Glucose, Bld: 142 mg/dL — ABNORMAL HIGH (ref 70–99)
Potassium: 4.5 mEq/L (ref 3.5–5.3)
Sodium: 139 mEq/L (ref 135–145)
Total Protein: 6.6 g/dL (ref 6.0–8.3)

## 2013-08-16 LAB — LIPID PANEL
HDL: 28 mg/dL — ABNORMAL LOW (ref >39)
LDL Cholesterol: 89 mg/dL (ref 0–99)
Total CHOL/HDL Ratio: 5.3 Ratio
Triglycerides: 159 mg/dL — ABNORMAL HIGH (ref <150)
VLDL: 32 mg/dL (ref 0–40)

## 2013-08-16 MED ORDER — TRAMADOL HCL 50 MG PO TABS: 50.0000 mg | ORAL_TABLET | Freq: Four times a day (QID) | ORAL | Status: DC | PRN

## 2013-08-16 NOTE — Telephone Encounter (Signed)
?   OK to Refill  

## 2013-08-16 NOTE — Telephone Encounter (Signed)
Ok to refill x 3 months??       

## 2013-08-16 NOTE — Progress Notes (Signed)
Subjective:    Patient ID: Linda Briggs, female    DOB: 10/10/1955, 57 y.o.   MRN: [id number]  HPI Patient is here today for followup of her diabetes. She is current on Glucotrol XL 10 mg by mouth daily, Novolin 70/30 65 units in the morning and 35 units at night and metformin 1000 mg by mouth twice a day. Her most recent sugars have been well controlled. Her fasting blood sugars are generally less than 1:30. 2 hour postprandial sugars are generally less than 200. She's not having any episodes of hypoglycemia. She is seeing cardiology for palpitations. She was started on Lopressor 50 mg by mouth twice a day. This is felt the palpitations. Apparently she is having one to 2 episodes a month which she typically describes almost out of body experience. She felt lightheaded, presyncopal, and extremely weak. She'll have to go lie down for 10 or 15 minutes. When she has checked her sugars during these attacks, her blood sugar is typically 120 to 130. She has never been able to check her blood pressure during the attack. She denies any palpitations or shortness of breath or chest pain. She denies any seizure-like activity. Past Medical History  Diagnosis Date  . Diabetes mellitus   . History of hepatitis B   . Neuropathy     feet  . Bulging disc   . Arthritis     feet & toes  . Hyperlipidemia   . Hypertension    Current Outpatient Prescriptions on File Prior to Visit  Medication Sig Dispense Refill  . aspirin 325 MG tablet Take 325 mg by mouth daily.      . Cyanocobalamin (VITAMIN B 12 PO) Take 500 mcg by mouth daily.      . furosemide (LASIX) 20 MG tablet Take 1 tablet (20 mg total) by mouth 2 (two) times daily as needed.  60 tablet  6  . gabapentin (NEURONTIN) 300 MG capsule TAKE 1 CAPSULE BY MOUTH 3 TIMES A DAY  90 capsule  2  . glipiZIDE (GLUCOTROL XL) 10 MG 24 hr tablet TAKE 1 TABLET BY MOUTH ONCE A DAY  30 tablet  5  . insulin NPH-regular (NOVOLIN 70/30) (70-30) 100 UNIT/ML injection 65  units am & 35 units pm daily.      Marland Kitchen lisinopril (PRINIVIL,ZESTRIL) 20 MG tablet TAKE 1 TABLET BY MOUTH ONCE A DAY  30 tablet  5  . metFORMIN (GLUCOPHAGE) 1000 MG tablet TAKE 1 TABLET BY MOUTH TWICE A DAY  60 tablet  2  . metoprolol tartrate (LOPRESSOR) 50 MG tablet Take 1 tablet (50 mg total) by mouth 2 (two) times daily.  180 tablet  3  . Multiple Vitamin (MULTIVITAMIN) tablet Take 1 tablet by mouth daily.      . potassium chloride (K-DUR) 10 MEQ tablet Take 10 mEq by mouth daily. Or BID prn swelling      . pravastatin (PRAVACHOL) 80 MG tablet Take 80 mg by mouth daily.      . traMADol (ULTRAM) 50 MG tablet Take 1 tablet (50 mg total) by mouth every 6 (six) hours as needed.  60 tablet  0   No current facility-administered medications on file prior to visit.   Allergies  Allergen Reactions  . Codeine   . Januvia [Sitagliptin]     Stomach cramps  . Penicillins    History   Social History  . Marital Status: Married    Spouse Name: N/A    Number of Children: N/A  .  Years of Education: N/A   Occupational History  . Not on file.   Social History Main Topics  . Smoking status: Never Smoker   . Smokeless tobacco: Never Used  . Alcohol Use: 0.0 oz/week     Comment: social drinker.  . Drug Use: No  . Sexual Activity: Not on file   Other Topics Concern  . Not on file   Social History Narrative  . No narrative on file      Review of Systems  All other systems reviewed and are negative.       Objective:   Physical Exam  Vitals reviewed. Neck: Neck supple. No JVD present. No thyromegaly present.  Cardiovascular: Normal rate, regular rhythm and normal heart sounds.  Exam reveals no gallop and no friction rub.   No murmur heard. Pulmonary/Chest: Effort normal and breath sounds normal. No respiratory distress. She has no wheezes. She has no rales. She exhibits no tenderness.  Abdominal: Soft. Bowel sounds are normal. She exhibits no distension and no mass. There is no  tenderness. There is no rebound and no guarding.  Musculoskeletal: She exhibits edema.  Lymphadenopathy:    She has no cervical adenopathy.          Assessment & Plan:  1. Type II or unspecified type diabetes mellitus without mention of complication, uncontrolled I will check hemoglobin A1c today. The goal the hemoglobin A1c for this patient is less than 7. Continue to try to titrate her insulin to achieve a goal A1c less than 7. Her blood pressures well controlled. I will continue the current medications at the present dosages. I'll also check a fasting lipid panel to check her LDL. Her goal LDL is less than 100. The presyncopal event she describes sound like either a drop in her blood pressure or possibly a cardiac arrhythmia. I feel seizure activity is unlikely. Therefore I would recommend a one-month event monitor/loop recorder. The patient has no insurance and she elects not to pursue this at the present time. Instead we decrease Lopressor to 25 mg by mouth twice a day. If the event still continue, I would recommend proceeding with a loop recorder and a neurology consultation for possible EEG.  When she feels better we can also increase Neurontin to help treat her peripheral neuropathy. I offered patient flu shot today but she declined. - COMPLETE METABOLIC PANEL WITH GFR - Hemoglobin A1c - Lipid panel

## 2013-08-16 NOTE — Telephone Encounter (Signed)
rx was printed and faxed to pharmacy 

## 2013-08-16 NOTE — Telephone Encounter (Signed)
Pt wants to make sure it was noted that she was needing a refill on her tramadol Call back number is [phone]

## 2013-08-20 ENCOUNTER — Other Ambulatory Visit: Payer: Self-pay | Admitting: Family Medicine

## 2013-08-20 NOTE — Telephone Encounter (Signed)
Medication refilled per protocol. 

## 2013-08-31 ENCOUNTER — Other Ambulatory Visit: Payer: Self-pay | Admitting: Family Medicine

## 2013-10-01 ENCOUNTER — Telehealth: Payer: Self-pay | Admitting: *Deleted

## 2013-10-01 ENCOUNTER — Other Ambulatory Visit: Payer: Self-pay | Admitting: Family Medicine

## 2013-10-04 ENCOUNTER — Other Ambulatory Visit: Payer: Self-pay

## 2013-10-04 ENCOUNTER — Other Ambulatory Visit: Payer: Self-pay | Admitting: *Deleted

## 2013-10-04 MED ORDER — POTASSIUM CHLORIDE ER 10 MEQ PO TBCR: 10.0000 meq | EXTENDED_RELEASE_TABLET | Freq: Every day | ORAL | Status: DC

## 2013-10-04 MED ORDER — FUROSEMIDE 20 MG PO TABS: 20.0000 mg | ORAL_TABLET | Freq: Two times a day (BID) | ORAL | Status: DC | PRN

## 2013-10-04 NOTE — Telephone Encounter (Signed)
Refill sent for potassium chl 10 meq

## 2013-10-04 NOTE — Telephone Encounter (Signed)
Requested Prescriptions   Signed Prescriptions Disp Refills  . furosemide (LASIX) 20 MG tablet 60 tablet 3    Sig: Take 1 tablet (20 mg total) by mouth 2 (two) times daily as needed.    Authorizing Provider: Antonieta IbaGOLLAN, TIMOTHY J    Ordering User: Shawnie DapperLOPEZ, MARINA C  . potassium chloride (K-DUR) 10 MEQ tablet 60 tablet 3    Sig: Take 1 tablet (10 mEq total) by mouth daily. Or BID prn swelling    Authorizing Provider: Antonieta IbaGOLLAN, TIMOTHY J    Ordering User: Kendrick FriesLOPEZ, MARINA C

## 2013-10-05 NOTE — Telephone Encounter (Signed)
Increase insulin to 70 am, 40 pm and recheck values in 2 weeks.

## 2013-10-07 NOTE — Telephone Encounter (Signed)
LMTRC

## 2013-10-07 NOTE — Telephone Encounter (Signed)
Patient aware.

## 2013-11-03 ENCOUNTER — Other Ambulatory Visit: Payer: Self-pay | Admitting: Family Medicine

## 2013-11-12 ENCOUNTER — Telehealth: Payer: Self-pay | Admitting: Family Medicine

## 2013-11-12 NOTE — Telephone Encounter (Signed)
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 If going to raise insulin level may need to be in the evening.  Sugar Levels:  3/6 Fasting 64 After meal 159  3/7 Fasting 74 After meal 191  3/8 Fasting 110 After meal 194  3/9 Fasting 155 After meal 227  3/10 Fasting 149 After meal 174  3/11 Fasting 120 After Meal 189  3/12 Fasting 130 After meal 202    Call back number is [phone]

## 2013-11-15 ENCOUNTER — Ambulatory Visit: Payer: Self-pay | Admitting: Family Medicine

## 2013-12-02 NOTE — Telephone Encounter (Signed)
After meal sugars are high, I would suggest adding januvia 50 poqday and recheck in 3 months with hga1c.
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
## 2013-12-06 NOTE — Telephone Encounter (Signed)
She needs to come in sooner.

## 2013-12-07 NOTE — Telephone Encounter (Signed)
t elects to wait for her appointment on Monday 12/13/13

## 2013-12-13 ENCOUNTER — Encounter: Payer: Self-pay | Admitting: Family Medicine

## 2013-12-13 ENCOUNTER — Ambulatory Visit (INDEPENDENT_AMBULATORY_CARE_PROVIDER_SITE_OTHER): Payer: Self-pay | Admitting: Family Medicine

## 2013-12-13 VITALS — BP 110/72 | HR 80 | Temp 97.5°F | Resp 16 | Ht 66.0 in | Wt 312.0 lb

## 2013-12-13 DIAGNOSIS — E1165 Type 2 diabetes mellitus with hyperglycemia: Secondary | ICD-10-CM

## 2013-12-13 DIAGNOSIS — M4800 Spinal stenosis, site unspecified: Secondary | ICD-10-CM

## 2013-12-13 DIAGNOSIS — IMO0001 Reserved for inherently not codable concepts without codable children: Secondary | ICD-10-CM

## 2013-12-13 MED ORDER — GLIPIZIDE ER 10 MG PO TB24: ORAL_TABLET | ORAL | Status: DC

## 2013-12-13 MED ORDER — POTASSIUM CHLORIDE ER 10 MEQ PO TBCR: 10.0000 meq | EXTENDED_RELEASE_TABLET | Freq: Every day | ORAL | Status: DC

## 2013-12-13 MED ORDER — PRAVASTATIN SODIUM 40 MG PO TABS: ORAL_TABLET | ORAL | Status: DC

## 2013-12-13 MED ORDER — METFORMIN HCL 1000 MG PO TABS: ORAL_TABLET | ORAL | Status: DC

## 2013-12-13 MED ORDER — TRAMADOL HCL 50 MG PO TABS: 50.0000 mg | ORAL_TABLET | Freq: Four times a day (QID) | ORAL | Status: DC | PRN

## 2013-12-13 MED ORDER — INSULIN NPH ISOPHANE & REGULAR (70-30) 100 UNIT/ML ~~LOC~~ SUSP: SUBCUTANEOUS | Status: DC

## 2013-12-13 MED ORDER — GABAPENTIN 300 MG PO CAPS: ORAL_CAPSULE | ORAL | Status: DC

## 2013-12-13 MED ORDER — LISINOPRIL 20 MG PO TABS: ORAL_TABLET | ORAL | Status: DC

## 2013-12-13 MED ORDER — METOPROLOL TARTRATE 50 MG PO TABS
50.0000 mg | ORAL_TABLET | Freq: Two times a day (BID) | ORAL | Status: DC
Start: 2013-12-13 — End: 2014-02-03

## 2013-12-13 MED ORDER — FUROSEMIDE 20 MG PO TABS
20.0000 mg | ORAL_TABLET | Freq: Two times a day (BID) | ORAL | Status: DC | PRN
Start: 2013-12-13 — End: 2014-02-03

## 2013-12-13 MED ORDER — PREDNISONE 20 MG PO TABS: ORAL_TABLET | ORAL | Status: DC

## 2013-12-13 NOTE — Progress Notes (Signed)
Subjective:    Patient ID: Linda Briggs, female    DOB: 08/27/1956, 58 y.o.   MRN: [id number]  HPI  Patient is here today for followup of her diabetes. She's not having insurance and therefore she requested we not draw blood work until she gets her insurance the month. She's currently taking 70/30 insulin 70 units in the morning and 40 units in the evening. She is also on glipizide and metformin. Her fasting blood sugars around 150. Her 2 hour postprandial sugars are greater than 200. Over the last week she has tried very hard to eat a low carbohydrate diet. With this effort, her fasting blood sugars range 70-100 and her two-hour postprandial sugars range 120-150. These numbers are excellent.  She has a history of spinal stenosis.  The results of her lumbar spine MRI from 2005 are listed below: 1. 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  2. At L4-5, there is a central and left-sided disk extrusion resulting in mild to moderate central, left-sided lateral recess, and left neural foraminal stenosis.  3. Mild to moderate multifactorial spinal stenosis at L3-4 with right-sided lateral recess stenosis.  4. Mild multifactorial spinal stenosis at L1-2 and L2-3.  5. Small posterolateral disk protrusion on the right at L5-S1 with possible resulting right S1 nerve root encroachment.  She complains of daily low back pain which radiates primarily into her right leg down to her right knee. She describes neuropathic pain radiating into the right leg. She describes constant severe pain in her lower back. She's currently using tramadol twice a day to help manage the pain. She denies any symptoms of cauda equina syndrome. Past Medical History  Diagnosis Date  . Diabetes mellitus   . History of hepatitis B   . Neuropathy     feet  . Bulging disc   . Arthritis     feet & toes  . Hyperlipidemia   . Hypertension    Current Outpatient Prescriptions on File Prior to Visit  Medication Sig  Dispense Refill  . aspirin 325 MG tablet Take 325 mg by mouth daily.      . Cyanocobalamin (VITAMIN B 12 PO) Take 500 mcg by mouth daily.      . furosemide (LASIX) 20 MG tablet Take 1 tablet (20 mg total) by mouth 2 (two) times daily as needed.  60 tablet  3  . gabapentin (NEURONTIN) 300 MG capsule TAKE 1 CAPSULE BY MOUTH 3 TIMES A DAY  90 capsule  3  . glipiZIDE (GLUCOTROL XL) 10 MG 24 hr tablet TAKE 1 TABLET BY MOUTH ONCE A DAY  30 tablet  5  . insulin NPH-regular (NOVOLIN 70/30) (70-30) 100 UNIT/ML injection 65 units am & 35 units pm daily.      Marland Kitchen. lisinopril (PRINIVIL,ZESTRIL) 20 MG tablet TAKE 1 TABLET BY MOUTH ONCE A DAY  30 tablet  5  . metFORMIN (GLUCOPHAGE) 1000 MG tablet TAKE 1 TABLET BY MOUTH TWICE A DAY  60 tablet  3  . metoprolol tartrate (LOPRESSOR) 50 MG tablet Take 1 tablet (50 mg total) by mouth 2 (two) times daily.  180 tablet  3  . Multiple Vitamin (MULTIVITAMIN) tablet Take 1 tablet by mouth daily.      . potassium chloride (K-DUR) 10 MEQ tablet Take 1 tablet (10 mEq total) by mouth daily. Or BID prn swelling  60 tablet  3  . pravastatin (PRAVACHOL) 40 MG tablet TAKE 1 TABLET BY MOUTH ONCE A DAY  30  tablet  5   No current facility-administered medications on file prior to visit.   Allergies  Allergen Reactions  . Codeine   . Januvia [Sitagliptin]     Stomach cramps  . Penicillins    Past Surgical History  Procedure Laterality Date  . Tonsillectomy     History   Social History  . Marital Status: Married    Spouse Name: N/A    Number of Children: N/A  . Years of Education: N/A   Occupational History  . Not on file.   Social History Main Topics  . Smoking status: Never Smoker   . Smokeless tobacco: Never Used  . Alcohol Use: 0.0 oz/week     Comment: social drinker.  . Drug Use: No  . Sexual Activity: Not on file   Other Topics Concern  . Not on file   Social History Narrative  . No narrative on file      Review of Systems  All other systems  reviewed and are negative.      Objective:   Physical Exam  Vitals reviewed. Constitutional: She is oriented to person, place, and time.  Cardiovascular: Normal rate and regular rhythm.   Pulmonary/Chest: Effort normal and breath sounds normal.  Neurological: She is alert and oriented to person, place, and time. She displays normal reflexes. She exhibits normal muscle tone. Coordination normal.          Assessment & Plan:  1. Spinal stenosis Even though this will exacerbate her sugars I am going to place the patient on a prednisone taper pack to try to help control the pain from her spinal stenosis. She denies the continued use of tramadol every 6 hours as needed. - predniSONE (DELTASONE) 20 MG tablet; 3 tabs poqday 1-2, 2 tabs poqday 3-4, 1 tab poqday 5-6  Dispense: 12 tablet; Refill: 0 - traMADol (ULTRAM) 50 MG tablet; Take 1 tablet (50 mg total) by mouth every 6 (six) hours as needed.  Dispense: 60 tablet; Refill: 2  2. Type II or unspecified type diabetes mellitus without mention of complication, uncontrolled We spent 15 minutes discussing her options to control her blood sugars including a low carbohydrate diet, increasing aerobic exercise, adding Actos, Victoza or Invokana.  The patient would like to try 3 months of aggressive therapeutic lifestyle changes and then recheck a CMP, hemoglobin A1c, and fasting lipid panel in 3 months. Patient to call me with her fasting blood sugars and two-hour postprandial sugars in one month. If they are elevated, I will add  Actos at that time.

## 2014-02-03 ENCOUNTER — Emergency Department (HOSPITAL_COMMUNITY): Payer: Medicare HMO

## 2014-02-03 ENCOUNTER — Emergency Department (HOSPITAL_COMMUNITY)
Admission: EM | Admit: 2014-02-03 | Discharge: 2014-02-03 | Disposition: A | Discharge status: Home / Self Care | Payer: Medicare HMO | Attending: Emergency Medicine | Admitting: Emergency Medicine

## 2014-02-03 ENCOUNTER — Encounter (HOSPITAL_COMMUNITY): Payer: Self-pay | Admitting: Emergency Medicine

## 2014-02-03 DIAGNOSIS — Z880 Allergy status to penicillin: Secondary | ICD-10-CM | POA: Diagnosis not present

## 2014-02-03 DIAGNOSIS — Z7982 Long term (current) use of aspirin: Secondary | ICD-10-CM | POA: Diagnosis not present

## 2014-02-03 DIAGNOSIS — R109 Unspecified abdominal pain: Secondary | ICD-10-CM

## 2014-02-03 DIAGNOSIS — R1012 Left upper quadrant pain: Secondary | ICD-10-CM | POA: Diagnosis not present

## 2014-02-03 DIAGNOSIS — Z8619 Personal history of other infectious and parasitic diseases: Secondary | ICD-10-CM | POA: Insufficient documentation

## 2014-02-03 DIAGNOSIS — R112 Nausea with vomiting, unspecified: Secondary | ICD-10-CM | POA: Diagnosis not present

## 2014-02-03 DIAGNOSIS — Z794 Long term (current) use of insulin: Secondary | ICD-10-CM | POA: Diagnosis not present

## 2014-02-03 DIAGNOSIS — I10 Essential (primary) hypertension: Secondary | ICD-10-CM | POA: Diagnosis not present

## 2014-02-03 DIAGNOSIS — G5790 Unspecified mononeuropathy of unspecified lower limb: Secondary | ICD-10-CM | POA: Diagnosis not present

## 2014-02-03 DIAGNOSIS — M19079 Primary osteoarthritis, unspecified ankle and foot: Secondary | ICD-10-CM | POA: Insufficient documentation

## 2014-02-03 DIAGNOSIS — Z79899 Other long term (current) drug therapy: Secondary | ICD-10-CM | POA: Insufficient documentation

## 2014-02-03 DIAGNOSIS — R1013 Epigastric pain: Secondary | ICD-10-CM | POA: Insufficient documentation

## 2014-02-03 DIAGNOSIS — IMO0002 Reserved for concepts with insufficient information to code with codable children: Secondary | ICD-10-CM | POA: Insufficient documentation

## 2014-02-03 DIAGNOSIS — R630 Anorexia: Secondary | ICD-10-CM | POA: Insufficient documentation

## 2014-02-03 DIAGNOSIS — R1011 Right upper quadrant pain: Secondary | ICD-10-CM | POA: Insufficient documentation

## 2014-02-03 DIAGNOSIS — E119 Type 2 diabetes mellitus without complications: Secondary | ICD-10-CM | POA: Diagnosis not present

## 2014-02-03 DIAGNOSIS — R1110 Vomiting, unspecified: Secondary | ICD-10-CM

## 2014-02-03 LAB — COMPREHENSIVE METABOLIC PANEL
ALT: 18 U/L (ref 0–35)
AST: 20 U/L (ref 0–37)
Albumin: 4 g/dL (ref 3.5–5.2)
Alkaline Phosphatase: 80 U/L (ref 39–117)
BILIRUBIN TOTAL: 0.6 mg/dL (ref 0.3–1.2)
BUN: 12 mg/dL (ref 6–23)
CHLORIDE: 98 meq/L (ref 96–112)
CO2: 25 meq/L (ref 19–32)
CREATININE: 0.82 mg/dL (ref 0.50–1.10)
Calcium: 10.4 mg/dL (ref 8.4–10.5)
GFR, EST NON AFRICAN AMERICAN: 79 mL/min — AB (ref >90)
Glucose, Bld: 118 mg/dL — ABNORMAL HIGH (ref 70–99)
Potassium: 4.6 mEq/L (ref 3.7–5.3)
Sodium: 142 mEq/L (ref 137–147)
Total Protein: 7.9 g/dL (ref 6.0–8.3)

## 2014-02-03 LAB — URINALYSIS, ROUTINE W REFLEX MICROSCOPIC
Glucose, UA: NEGATIVE mg/dL
Hgb urine dipstick: NEGATIVE
KETONES UR: 15 mg/dL — AB
NITRITE: NEGATIVE
PROTEIN: 100 mg/dL — AB
Specific Gravity, Urine: 1.025 (ref 1.005–1.030)
UROBILINOGEN UA: 1 mg/dL (ref 0.0–1.0)
pH: 6 (ref 5.0–8.0)

## 2014-02-03 LAB — URINE MICROSCOPIC-ADD ON

## 2014-02-03 LAB — CBC WITH DIFFERENTIAL/PLATELET
BASOS ABS: 0 10*3/uL (ref 0.0–0.1)
Basophils Relative: 0 % (ref 0–1)
EOS PCT: 1 % (ref 0–5)
Eosinophils Absolute: 0.2 10*3/uL (ref 0.0–0.7)
HEMATOCRIT: 43 % (ref 36.0–46.0)
HEMOGLOBIN: 14.9 g/dL (ref 12.0–15.0)
LYMPHS PCT: 20 % (ref 12–46)
Lymphs Abs: 2.5 10*3/uL (ref 0.7–4.0)
MCH: 31.8 pg (ref 26.0–34.0)
MCHC: 34.7 g/dL (ref 30.0–36.0)
MCV: 91.9 fL (ref 78.0–100.0)
MONO ABS: 0.6 10*3/uL (ref 0.1–1.0)
Monocytes Relative: 5 % (ref 3–12)
Neutro Abs: 9 10*3/uL — ABNORMAL HIGH (ref 1.7–7.7)
Neutrophils Relative %: 74 % (ref 43–77)
Platelets: 208 10*3/uL (ref 150–400)
RBC: 4.68 MIL/uL (ref 3.87–5.11)
RDW: 14 % (ref 11.5–15.5)
WBC: 12.2 10*3/uL — AB (ref 4.0–10.5)

## 2014-02-03 LAB — I-STAT TROPONIN, ED: TROPONIN I, POC: 0 ng/mL (ref 0.00–0.08)

## 2014-02-03 LAB — LIPASE, BLOOD: LIPASE: 62 U/L — AB (ref 11–59)

## 2014-02-03 MED ORDER — SODIUM CHLORIDE 0.9 % IV BOLUS (SEPSIS)
1000.0000 mL | Freq: Once | INTRAVENOUS | Status: AC
  Administered 2014-02-03: 1000 mL via INTRAVENOUS

## 2014-02-03 MED ORDER — ONDANSETRON HCL 4 MG/2ML IJ SOLN
4.0000 mg | Freq: Once | INTRAMUSCULAR | Status: AC
  Administered 2014-02-03: 4 mg via INTRAVENOUS
  Filled 2014-02-03: qty 2

## 2014-02-03 MED ORDER — HYDROMORPHONE HCL PF 1 MG/ML IJ SOLN
1.0000 mg | Freq: Once | INTRAMUSCULAR | Status: AC
  Administered 2014-02-03: 1 mg via INTRAVENOUS
  Filled 2014-02-03: qty 1

## 2014-02-03 MED ORDER — ONDANSETRON HCL 4 MG PO TABS: 4.0000 mg | ORAL_TABLET | Freq: Four times a day (QID) | ORAL | Status: DC

## 2014-02-03 NOTE — ED Notes (Signed)
The pt has  Had abd pain with nv today and she felt faint.  Rapid respirations

## 2014-02-03 NOTE — Discharge Instructions (Signed)
Abdominal Pain, Adult  Many things can cause belly (abdominal) pain. Most times, the belly pain is not dangerous. Many cases of belly pain can be watched and treated at home.  HOME CARE   · Do not take medicines that help you go poop (laxatives) unless told to by your doctor.  · Only take medicine as told by your doctor.  · Eat or drink as told by your doctor. Your doctor will tell you if you should be on a special diet.  GET HELP IF:  · You do not know what is causing your belly pain.  · You have belly pain while you are sick to your stomach (nauseous) or have runny poop (diarrhea).  · You have pain while you pee or poop.  · Your belly pain wakes you up at night.  · You have belly pain that gets worse or better when you eat.  · You have belly pain that gets worse when you eat fatty foods.  GET HELP RIGHT AWAY IF:   · The pain does not go away within 2 hours.  · You have a fever.  · You keep throwing up (vomiting).  · The pain changes and is only in the right or left part of the belly.  · You have bloody or tarry looking poop.  MAKE SURE YOU:   · Understand these instructions.  · Will watch your condition.  · Will get help right away if you are not doing well or get worse.  Document Released: 02/05/2008 Document Revised: 06/09/2013 Document Reviewed: 04/28/2013  [employer]® Patient Information ©2014 [employer].

## 2014-02-03 NOTE — ED Provider Notes (Addendum)
CSN: [id number]     Arrival date & time 02/03/14  1649 History   First MD Initiated Contact with Patient 02/03/14 1651     Chief Complaint  Patient presents with  . Abdominal Pain     (Consider location/radiation/quality/duration/timing/severity/associated sxs/prior Treatment) Patient is a 58 y.o. female presenting with abdominal pain. The history is provided by the patient.  Abdominal Pain Pain location:  Epigastric, LUQ and RUQ Pain quality: cramping, sharp, squeezing and stabbing   Pain radiates to:  Back Pain severity:  Severe Onset quality:  Gradual Duration:  8 hours Timing:  Constant Progression:  Worsening Chronicity:  New Context comment:  States felt fine yesterday and woke up this a.m. feeling nauseated and ate something and then pain became worse Relieved by:  Nothing Worsened by:  Eating Ineffective treatments: Alka-Seltzer. Associated symptoms: anorexia, nausea and vomiting   Associated symptoms: no chest pain, no constipation, no cough, no diarrhea, no dysuria and no fever   Associated symptoms comment:  States when the abd pain gets really bad it takes her breath away but denies true SOB.  Also states that since the vomiting today she has had some palpitations and lightheadedness but no syncope Risk factors: aspirin and obesity   Risk factors: no alcohol abuse, has not had multiple surgeries and no recent hospitalization     Past Medical History  Diagnosis Date  . Diabetes mellitus   . History of hepatitis B   . Neuropathy     feet  . Bulging disc   . Arthritis     feet & toes  . Hyperlipidemia   . Hypertension    Past Surgical History  Procedure Laterality Date  . Tonsillectomy     Family History  Problem Relation Age of Onset  . Hyperlipidemia Mother   . Heart disease Father   . Hyperlipidemia Father   . Hypertension Father   . Heart disease Brother   . Stroke Brother    History  Substance Use Topics  . Smoking status: Never Smoker   .  Smokeless tobacco: Never Used  . Alcohol Use: 0.0 oz/week     Comment: social drinker.   OB History   Grav Para Term Preterm Abortions TAB SAB Ect Mult Living                 Review of Systems  Constitutional: Negative for fever.  Respiratory: Negative for cough.   Cardiovascular: Negative for chest pain.  Gastrointestinal: Positive for nausea, vomiting, abdominal pain and anorexia. Negative for diarrhea and constipation.  Genitourinary: Negative for dysuria.  All other systems reviewed and are negative.     Allergies  Codeine; Januvia; and Penicillins  Home Medications   Prior to Admission medications   Medication Sig Start Date End Date Taking? Authorizing Provider  aspirin 325 MG tablet Take 325 mg by mouth daily.    Historical Provider, MD  Cyanocobalamin (VITAMIN B 12 PO) Take 500 mcg by mouth daily.    Historical Provider, MD  furosemide (LASIX) 20 MG tablet Take 1 tablet (20 mg total) by mouth 2 (two) times daily as needed. 12/13/13   Donita Brooks, MD  gabapentin (NEURONTIN) 300 MG capsule TAKE 1 CAPSULE BY MOUTH 3 TIMES A DAY 12/13/13   Donita Brooks, MD  glipiZIDE (GLUCOTROL XL) 10 MG 24 hr tablet TAKE 1 TABLET BY MOUTH ONCE A DAY 12/13/13   Donita Brooks, MD  insulin NPH-regular (NOVOLIN 70/30) (70-30) 100 UNIT/ML injection 65 units am &  35 units pm daily. 05/26/13   Donita BrooksWarren T Pickard, MD  insulin NPH-regular Human (NOVOLIN 70/30 RELION) (70-30) 100 UNIT/ML injection INJECT 70 UNITS SUBCUTANEOUSLY EVERY MORNING AND 40 UNITS IN THE EVENING 12/13/13   Donita BrooksWarren T Pickard, MD  lisinopril (PRINIVIL,ZESTRIL) 20 MG tablet TAKE 1 TABLET BY MOUTH ONCE A DAY 12/13/13   Donita BrooksWarren T Pickard, MD  metFORMIN (GLUCOPHAGE) 1000 MG tablet TAKE 1 TABLET BY MOUTH TWICE A DAY 12/13/13   Donita BrooksWarren T Pickard, MD  metoprolol (LOPRESSOR) 50 MG tablet Take 1 tablet (50 mg total) by mouth 2 (two) times daily. 12/13/13 03/27/15  Donita BrooksWarren T Pickard, MD  Multiple Vitamin (MULTIVITAMIN) tablet Take 1 tablet by  mouth daily.    Historical Provider, MD  potassium chloride (K-DUR) 10 MEQ tablet Take 1 tablet (10 mEq total) by mouth daily. Or BID prn swelling 12/13/13   Donita BrooksWarren T Pickard, MD  predniSONE (DELTASONE) 20 MG tablet 3 tabs poqday 1-2, 2 tabs poqday 3-4, 1 tab poqday 5-6 12/13/13   Donita BrooksWarren T Pickard, MD  traMADol (ULTRAM) 50 MG tablet Take 1 tablet (50 mg total) by mouth every 6 (six) hours as needed. 12/13/13   Donita BrooksWarren T Pickard, MD   There were no vitals taken for this visit. Physical Exam  Nursing note and vitals reviewed. Constitutional: She is oriented to person, place, and time. She appears well-developed and well-nourished. She appears distressed.  Appears in pain  HENT:  Head: Normocephalic and atraumatic.  Mouth/Throat: Oropharynx is clear and moist. Mucous membranes are dry.  Eyes: Conjunctivae and EOM are normal. Pupils are equal, round, and reactive to light.  Neck: Normal range of motion. Neck supple.  Cardiovascular: Normal rate, regular rhythm and intact distal pulses.   No murmur heard. Pulmonary/Chest: Breath sounds normal. Tachypnea noted. No respiratory distress. She has no wheezes. She has no rales.  Abdominal: Soft. Bowel sounds are normal. She exhibits no distension. There is tenderness in the right upper quadrant and epigastric area. There is guarding and positive Murphy's sign. There is no rebound.  Musculoskeletal: Normal range of motion. She exhibits no edema and no tenderness.  Neurological: She is alert and oriented to person, place, and time.  Skin: Skin is warm and dry. No rash noted. No erythema.  Psychiatric: She has a normal mood and affect. Her behavior is normal.    ED Course  Procedures (including critical care time) Labs Review Labs Reviewed  CBC WITH DIFFERENTIAL - Abnormal; Notable for the following:    WBC 12.2 (*)    Neutro Abs 9.0 (*)    All other components within normal limits  COMPREHENSIVE METABOLIC PANEL - Abnormal; Notable for the following:     Glucose, Bld 118 (*)    GFR calc non Af Amer 79 (*)    All other components within normal limits  LIPASE, BLOOD - Abnormal; Notable for the following:    Lipase 62 (*)    All other components within normal limits  URINALYSIS, ROUTINE W REFLEX MICROSCOPIC - Abnormal; Notable for the following:    Color, Urine AMBER (*)    APPearance CLOUDY (*)    Bilirubin Urine SMALL (*)    Ketones, ur 15 (*)    Protein, ur 100 (*)    Leukocytes, UA SMALL (*)    All other components within normal limits  URINE MICROSCOPIC-ADD ON - Abnormal; Notable for the following:    Squamous Epithelial / LPF MANY (*)    Bacteria, UA FEW (*)    Casts  HYALINE CASTS (*)    All other components within normal limits  I-STAT TROPOININ, ED    Imaging Review US Abdomen Complete  02/03/2014   CLINICAL DATA:  Epigastric pain.  EXAM: ULTRASOUND ABDOMEN COMPLETE  COMPARISON:  None.  FINDINGS: Gallbladder:  No gallstones or wall thickening visualized. No sonographic Murphy sign noted.  Common bile duct:  Diameter: Measures 5.5 mm which is within normal limits.  Liver:  Fatty infiltration of the liver is noted. No focal abnormality is noted.  IVC:  Not visualized due to overlying bowel gas.  Pancreas:  Not well visualized due to overlying bowel gas.  Spleen:  Size and appearance within normal limits.  Right Kidney:  Length: 12.2 cm. Echogenicity within normal limits. No mass or hydronephrosis visualized.  Left Kidney:  Length: 15.2 cm. Echogenicity within normal limits. No mass or hydronephrosis visualized.  Abdominal aorta:  No aneurysm visualized.  Other findings:  None.  IMPRESSION: Fatty infiltration of the liver. No other significant abnormality seen in the abdomen. Pancreas not well visualized due to overlying bowel gas.   Electronically Signed   By: Roque Lias M.D.   On: 02/03/2014 20:02     EKG Interpretation   Date/Time:  Thursday February 03 2014 17:03:27 EDT Ventricular Rate:  81 PR Interval:  162 QRS Duration:  70 QT Interval:  370 QTC Calculation: 429 R Axis:   -22 Text Interpretation:  Sinus rhythm Borderline left axis deviation Low  voltage, precordial leads No previous tracing Confirmed by Anitra Lauth  MD,  Alphonzo Lemmings ([id number]) on 02/03/2014 5:10:46 PM      MDM   Final diagnoses:  Abdominal pain  Vomiting   Patient presenting with 8 hours of abdominal pain nausea and vomiting concerning for cholecystitis, hepatitis, pancreatitis. No symptoms concerning for renal colic or urinary tract infection. Low concern for perforation or ACS. The patient has an EKG without significant findings.  CBC, CMP, lipase, UA, abdominal ultrasound pending for further evaluation. Patient given IV fluids, pain and nausea control  9:18 PM U/S wnl without acute findings.  Labs without significant findings.  After 1 round of meds pt has felt fine.  Denies any pain now.  Will po challenge and if no pain will d/c with strict return precautions.  Gwyneth Sprout, MD 02/03/14 2120  Gwyneth Sprout, MD 02/03/14 2133

## 2014-02-07 ENCOUNTER — Telehealth: Payer: Self-pay | Admitting: Family Medicine

## 2014-02-07 NOTE — Telephone Encounter (Signed)
LMOVM that it is recommended that pt f/u w/ PCP post hosp visit. If pt is feeling ok with no complaints then the appt can wait until she can be seen but if still having abd pain then NTBS asap.

## 2014-02-07 NOTE — Telephone Encounter (Signed)
Message copied by Ricard Dillon on Mon Feb 07, 2014  4:08 PM ------      Message from: Harriet Masson      Created: Fri Feb 04, 2014 11:18 AM      Regarding: RE: hospital last night      Contact: [phone]       Just wanted to let you know and to see if there was any reason for her to f/u with Dr Tanya Nones.       ----- Message -----         From: Ricard Dillon         Sent: 02/04/2014  10:36 AM           To: Harriet Masson      Subject: RE: hospital last night                                  Is there a question here or is this just fyi?      ----- Message -----         From: Harriet Masson         Sent: 02/04/2014   8:41 AM           To: Ricard Dillon      Subject: hospital last night                                      PT went to er last night, they are not sure what was wrong last night thought it was gallbladder but [place] came back fine.              ------

## 2014-02-21 ENCOUNTER — Inpatient Hospital Stay: Payer: Self-pay | Admitting: Family Medicine

## 2014-02-28 ENCOUNTER — Ambulatory Visit (INDEPENDENT_AMBULATORY_CARE_PROVIDER_SITE_OTHER): Payer: Commercial Managed Care - HMO | Admitting: Family Medicine

## 2014-02-28 ENCOUNTER — Encounter: Payer: Self-pay | Admitting: Family Medicine

## 2014-02-28 VITALS — BP 120/64 | HR 86 | Temp 99.0°F | Resp 20 | Ht 66.0 in | Wt 312.0 lb

## 2014-02-28 DIAGNOSIS — IMO0001 Reserved for inherently not codable concepts without codable children: Secondary | ICD-10-CM

## 2014-02-28 DIAGNOSIS — E1165 Type 2 diabetes mellitus with hyperglycemia: Principal | ICD-10-CM

## 2014-02-28 DIAGNOSIS — R1011 Right upper quadrant pain: Secondary | ICD-10-CM

## 2014-02-28 NOTE — Progress Notes (Signed)
Subjective:    Patient ID: Linda Briggs, female    DOB: 09/30/1955, 58 y.o.   MRN: [id number]  HPI Patient was seen in early June in the emergency room due to severe epigastric and right upper quadrant abdominal pain. Pain began after she ate a fatty meal. It was intense and severe and lasted several hours. He gradually resolved while she was in the emergency room. Lab work in the emergency room was significant for a lipase was slightly elevated in the 60s and a mild elevation in her right blood cell count. A right upper quadrant ultrasound was obtained which revealed no cholecystitis or cholelithiasis. She continues to have episodic right upper quadrant abdominal pain triggered by food. She also reports occasional nausea and vomiting associated with the right abdominal pain. She denies any melena or hematochezia. She is due for recheck of her diabetes in 2 weeks. She is also wondering if we can obtain lab work while she is here today. Past Medical History  Diagnosis Date  . Diabetes mellitus   . History of hepatitis B   . Neuropathy     feet  . Bulging disc   . Arthritis     feet & toes  . Hyperlipidemia   . Hypertension    Past Surgical History  Procedure Laterality Date  . Tonsillectomy     Current Outpatient Prescriptions on File Prior to Visit  Medication Sig Dispense Refill  . aspirin 325 MG tablet Take 325 mg by mouth daily.      . Cyanocobalamin (VITAMIN B 12 PO) Take 500 mcg by mouth daily.      . furosemide (LASIX) 20 MG tablet Take 20 mg by mouth daily.      Marland Kitchen. gabapentin (NEURONTIN) 300 MG capsule Take 300 mg by mouth 3 (three) times daily.      Marland Kitchen. glipiZIDE (GLUCOTROL XL) 10 MG 24 hr tablet Take 10 mg by mouth daily with breakfast.      . insulin NPH-regular Human (NOVOLIN 70/30) (70-30) 100 UNIT/ML injection Inject 48-70 Units into the skin See admin instructions. Inject 70 units into the skin in the morning, then 48 units into the skin at bedtime      . lisinopril  (PRINIVIL,ZESTRIL) 20 MG tablet Take 20 mg by mouth daily.      . metFORMIN (GLUCOPHAGE) 1000 MG tablet Take 1,000 mg by mouth 2 (two) times daily with a meal.      . metoprolol tartrate (LOPRESSOR) 25 MG tablet Take 25 mg by mouth 2 (two) times daily.      . Multiple Vitamin (MULTIVITAMIN) tablet Take 1 tablet by mouth daily.      Marland Kitchen. Phenyleph-Doxylamine-DM-APAP (ALKA SELTZER PLUS PO) Take 1 tablet by mouth daily as needed (stomach pain).      . potassium chloride (K-DUR) 10 MEQ tablet Take 10 mEq by mouth daily.      . pravastatin (PRAVACHOL) 40 MG tablet Take 40 mg by mouth daily.      . traMADol (ULTRAM) 50 MG tablet Take 1 tablet (50 mg total) by mouth every 6 (six) hours as needed.  60 tablet  2  . VITAMIN D, CHOLECALCIFEROL, PO Take 1 tablet by mouth daily.      . ondansetron (ZOFRAN) 4 MG tablet Take 1 tablet (4 mg total) by mouth every 6 (six) hours.  12 tablet  0   No current facility-administered medications on file prior to visit.   Allergies  Allergen Reactions  . Codeine  Anaphylaxis and Hives  . Penicillins Anaphylaxis and Hives  . Food Nausea And Vomiting    Olives   . Januvia [Sitagliptin]     Stomach cramps  . Latex Hives   History   Social History  . Marital Status: Married    Spouse Name: N/A    Number of Children: N/A  . Years of Education: N/A   Occupational History  . Not on file.   Social History Main Topics  . Smoking status: Never Smoker   . Smokeless tobacco: Never Used  . Alcohol Use: 0.0 oz/week     Comment: social drinker.  . Drug Use: No  . Sexual Activity: Not on file   Other Topics Concern  . Not on file   Social History Narrative  . No narrative on file      Review of Systems  All other systems reviewed and are negative.      Objective:   Physical Exam  Vitals reviewed. Constitutional: She appears well-developed and well-nourished. No distress.  Neck: Neck supple. No JVD present. No thyromegaly present.  Cardiovascular:  Normal rate, regular rhythm and normal heart sounds.  Exam reveals no gallop and no friction rub.   No murmur heard. Pulmonary/Chest: Effort normal and breath sounds normal. No respiratory distress. She has no wheezes. She has no rales.  Abdominal: Soft. Bowel sounds are normal. She exhibits no distension. There is no tenderness. There is no rebound and no guarding.  Musculoskeletal: She exhibits no edema.  Lymphadenopathy:    She has no cervical adenopathy.  Skin: She is not diaphoretic.          Assessment & Plan:  Type II or unspecified type diabetes mellitus without mention of complication, uncontrolled - Plan: COMPLETE METABOLIC PANEL WITH GFR, Hemoglobin A1c, Lipid panel  RUQ abdominal pain - Plan: NM Hepatobiliary   I will obtain a CMP, fasting lipid panel, and hemoglobin A1c to save the patient an extra trip in the office. The differential diagnoses for the patient's abdominal pain includes biliary dyskinesia, gastroparesis, partial small bowel obstruction, or mild pancreatitis. Obtain a HIDA scan to rule out biliary dyskinesia. If the HIDA scan is normal, I will obtain a gastric emptying study to evaluate for gastroparesis. Consider CT scan if symptoms of small bowel obstruction return.

## 2014-03-01 ENCOUNTER — Other Ambulatory Visit: Payer: Self-pay | Admitting: Family Medicine

## 2014-03-01 ENCOUNTER — Telehealth: Payer: Self-pay | Admitting: *Deleted

## 2014-03-01 DIAGNOSIS — R1011 Right upper quadrant pain: Secondary | ICD-10-CM

## 2014-03-01 LAB — COMPLETE METABOLIC PANEL WITH GFR
ALT: 14 U/L (ref 0–35)
AST: 17 U/L (ref 0–37)
Albumin: 3.9 g/dL (ref 3.5–5.2)
Alkaline Phosphatase: 71 U/L (ref 39–117)
BUN: 12 mg/dL (ref 6–23)
CALCIUM: 9.1 mg/dL (ref 8.4–10.5)
CHLORIDE: 100 meq/L (ref 96–112)
CO2: 29 meq/L (ref 19–32)
CREATININE: 0.77 mg/dL (ref 0.50–1.10)
GFR, Est Non African American: 87 mL/min
Glucose, Bld: 80 mg/dL (ref 70–99)
Potassium: 4.3 mEq/L (ref 3.5–5.3)
SODIUM: 140 meq/L (ref 135–145)
TOTAL PROTEIN: 6.8 g/dL (ref 6.0–8.3)
Total Bilirubin: 0.5 mg/dL (ref 0.2–1.2)

## 2014-03-01 LAB — HEMOGLOBIN A1C
Hgb A1c MFr Bld: 6.8 % — ABNORMAL HIGH (ref <5.7)
MEAN PLASMA GLUCOSE: 148 mg/dL — AB (ref <117)

## 2014-03-01 LAB — LIPID PANEL
CHOLESTEROL: 163 mg/dL (ref 0–200)
HDL: 25 mg/dL — AB (ref >39)
LDL Cholesterol: 100 mg/dL — ABNORMAL HIGH (ref 0–99)
Total CHOL/HDL Ratio: 6.5 Ratio
Triglycerides: 188 mg/dL — ABNORMAL HIGH (ref <150)
VLDL: 38 mg/dL (ref 0–40)

## 2014-03-01 NOTE — Telephone Encounter (Signed)
Submitted [employer] Referral thru [employer] for authorizaton for [facility] at [facility] authorization is pending.

## 2014-03-07 NOTE — Telephone Encounter (Signed)
[facility], authorizatioin number is  [id number], information was faxed to Nuclear Med.

## 2014-03-10 ENCOUNTER — Other Ambulatory Visit: Payer: Self-pay | Admitting: Family Medicine

## 2014-03-10 MED ORDER — BD SWAB SINGLE USE REGULAR PADS: MEDICATED_PAD | Status: DC

## 2014-03-10 MED ORDER — POTASSIUM CHLORIDE ER 10 MEQ PO TBCR: 10.0000 meq | EXTENDED_RELEASE_TABLET | Freq: Every day | ORAL | Status: DC

## 2014-03-10 MED ORDER — GLUCOSE BLOOD VI STRP: ORAL_STRIP | Status: DC

## 2014-03-10 MED ORDER — LISINOPRIL 20 MG PO TABS: 20.0000 mg | ORAL_TABLET | Freq: Every day | ORAL | Status: DC

## 2014-03-10 MED ORDER — ACCU-CHEK AVIVA VI SOLN: Status: DC

## 2014-03-10 MED ORDER — PRAVASTATIN SODIUM 40 MG PO TABS: 40.0000 mg | ORAL_TABLET | Freq: Every day | ORAL | Status: DC

## 2014-03-10 MED ORDER — GLIPIZIDE ER 10 MG PO TB24: 10.0000 mg | ORAL_TABLET | Freq: Every day | ORAL | Status: DC

## 2014-03-10 MED ORDER — GABAPENTIN 300 MG PO CAPS: 300.0000 mg | ORAL_CAPSULE | Freq: Three times a day (TID) | ORAL | Status: DC

## 2014-03-10 MED ORDER — METFORMIN HCL 1000 MG PO TABS: 1000.0000 mg | ORAL_TABLET | Freq: Two times a day (BID) | ORAL | Status: DC

## 2014-03-10 MED ORDER — METOPROLOL TARTRATE 25 MG PO TABS: 25.0000 mg | ORAL_TABLET | Freq: Two times a day (BID) | ORAL | Status: DC

## 2014-03-10 MED ORDER — ACCU-CHEK AVIVA DEVI: Status: DC

## 2014-03-10 MED ORDER — FUROSEMIDE 20 MG PO TABS: 20.0000 mg | ORAL_TABLET | Freq: Every day | ORAL | Status: DC

## 2014-03-10 MED ORDER — ACCU-CHEK SOFTCLIX LANCETS MISC: Status: DC

## 2014-03-10 NOTE — Telephone Encounter (Signed)
Meds sent to pharm

## 2014-03-11 ENCOUNTER — Telehealth: Payer: Self-pay | Admitting: Family Medicine

## 2014-03-11 ENCOUNTER — Telehealth: Payer: Self-pay | Admitting: *Deleted

## 2014-03-11 DIAGNOSIS — M4800 Spinal stenosis, site unspecified: Secondary | ICD-10-CM

## 2014-03-11 NOTE — Telephone Encounter (Signed)
ok 

## 2014-03-11 NOTE — Telephone Encounter (Signed)
Requesting a refill on Tramadol - ? OK to Refill  - Requesting it to go through mail - [employer] - ok to do???

## 2014-03-11 NOTE — Telephone Encounter (Signed)
[facility] stating that order needed to be changed to [phone] for authorization, I submitted authorization thru [employer] and is in pending status.

## 2014-03-12 MED ORDER — TRAMADOL HCL 50 MG PO TABS: 50.0000 mg | ORAL_TABLET | Freq: Four times a day (QID) | ORAL | Status: DC | PRN

## 2014-03-12 NOTE — Telephone Encounter (Signed)
rx printed and faxed to pharm  

## 2014-03-14 ENCOUNTER — Ambulatory Visit: Payer: Self-pay | Admitting: Family Medicine

## 2014-03-14 ENCOUNTER — Ambulatory Visit (HOSPITAL_COMMUNITY)
Admission: RE | Admit: 2014-03-14 | Discharge: 2014-03-14 | Disposition: A | Discharge status: Home / Self Care | Payer: Medicare HMO | Source: Ambulatory Visit | Attending: Family Medicine | Admitting: Family Medicine

## 2014-03-14 DIAGNOSIS — R1011 Right upper quadrant pain: Secondary | ICD-10-CM | POA: Insufficient documentation

## 2014-03-14 MED ORDER — SINCALIDE 5 MCG IJ SOLR
0.0200 ug/kg | Freq: Once | INTRAMUSCULAR | Status: AC
  Administered 2014-03-14: 09:00:00 via INTRAVENOUS

## 2014-03-14 MED ORDER — TECHNETIUM TC 99M MEBROFENIN IV KIT
5.0000 | PACK | Freq: Once | INTRAVENOUS | Status: AC | PRN
  Administered 2014-03-14: 5 via INTRAVENOUS

## 2014-03-14 MED ORDER — SINCALIDE 5 MCG IJ SOLR
INTRAMUSCULAR | Status: AC
  Filled 2014-03-14: qty 10

## 2014-03-18 NOTE — Telephone Encounter (Signed)
[facility] with [facility] number [id number]

## 2014-03-25 ENCOUNTER — Encounter: Payer: Self-pay | Admitting: Family Medicine

## 2014-03-28 ENCOUNTER — Ambulatory Visit (INDEPENDENT_AMBULATORY_CARE_PROVIDER_SITE_OTHER): Payer: Medicare HMO | Admitting: Cardiovascular Disease

## 2014-03-28 ENCOUNTER — Encounter: Payer: Self-pay | Admitting: Cardiovascular Disease

## 2014-03-28 ENCOUNTER — Encounter (INDEPENDENT_AMBULATORY_CARE_PROVIDER_SITE_OTHER): Payer: Self-pay

## 2014-03-28 VITALS — BP 118/78 | HR 71 | Ht 66.0 in | Wt 314.0 lb

## 2014-03-28 DIAGNOSIS — I498 Other specified cardiac arrhythmias: Secondary | ICD-10-CM

## 2014-03-28 DIAGNOSIS — I4902 Ventricular flutter: Secondary | ICD-10-CM

## 2014-03-28 DIAGNOSIS — I159 Secondary hypertension, unspecified: Secondary | ICD-10-CM

## 2014-03-28 DIAGNOSIS — R002 Palpitations: Secondary | ICD-10-CM

## 2014-03-28 DIAGNOSIS — R109 Unspecified abdominal pain: Secondary | ICD-10-CM | POA: Insufficient documentation

## 2014-03-28 DIAGNOSIS — R0602 Shortness of breath: Secondary | ICD-10-CM

## 2014-03-28 DIAGNOSIS — E118 Type 2 diabetes mellitus with unspecified complications: Secondary | ICD-10-CM

## 2014-03-28 DIAGNOSIS — R1013 Epigastric pain: Secondary | ICD-10-CM

## 2014-03-28 DIAGNOSIS — E785 Hyperlipidemia, unspecified: Secondary | ICD-10-CM

## 2014-03-28 DIAGNOSIS — I158 Other secondary hypertension: Secondary | ICD-10-CM

## 2014-03-28 DIAGNOSIS — R609 Edema, unspecified: Secondary | ICD-10-CM

## 2014-03-28 NOTE — Assessment & Plan Note (Signed)
Chronic mild shortness of breath likely from obesity and deconditioning.

## 2014-03-28 NOTE — Progress Notes (Signed)
Patient ID: YVANNA VIDAS, female    DOB: [birth year], 57 y.o.   MRN: [id number]  HPI Comments: Ms. Piccininni is a very pleasant 58 year old woman with diabetes, morbid obesity, hyperlipidemia, hypertension, chronic lower extremity swelling, patient of Dr. Tanya Nones, with previous symptoms of dizziness.  She is on insulin. She presents for routine followup She reports recent drop in her hemoglobin A1c down to 6.8. Previously was 8.1  Recent abdominal pain symptoms. She had a Hida scan that did not show just gallbladder disease Currently undergoing workup for gastroparesis. She is scheduled for gastric emptying study She continues to have episodes of nausea and abdominal pain.  She denies any chest pain. Chronic mild baseline shortness of breath. She does have leg edema and is only taking Lasix daily, previously was taking this twice a day Occasional fluttering. She takes metoprolol 25 mg twice a day. higher dose metoprolol causes lightheadedness    previous total cholesterol 180, LDL 111 In the past, she was not taking pravastatin on a regular basis  She wore  Holter monitor in the past that showed elevated baseline heart rate with frequent periods of sinus tachycardia. Improvement in dizzy episodes and fluttering with metoprolol. She has neuropathy in her feet.   EKG shows normal sinus rhythm with rate 71 beats per minute with no significant ST or T wave changes   Outpatient Encounter Prescriptions as of 03/28/2014  Medication Sig  . ACCU-CHEK SOFTCLIX LANCETS lancets Use as instructed  . Alcohol Swabs (B-D SINGLE USE SWABS REGULAR) PADS Use as directed  . aspirin 325 MG tablet Take 325 mg by mouth daily.  . Cyanocobalamin (VITAMIN B 12 PO) Take 500 mcg by mouth daily.  . furosemide (LASIX) 20 MG tablet Take 1 tablet (20 mg total) by mouth daily.  Marland Kitchen gabapentin (NEURONTIN) 300 MG capsule Take 1 capsule (300 mg total) by mouth 3 (three) times daily.  Marland Kitchen glipiZIDE (GLUCOTROL XL) 10 MG 24 hr  tablet Take 1 tablet (10 mg total) by mouth daily with breakfast.  . glucose blood (ACCU-CHEK AVIVA) test strip Use as instructed  . insulin NPH-regular Human (NOVOLIN 70/30) (70-30) 100 UNIT/ML injection Inject 48-70 Units into the skin See admin instructions. Inject 65 units into the skin in the morning, then 48 units into the skin at bedtime  . lisinopril (PRINIVIL,ZESTRIL) 20 MG tablet Take 1 tablet (20 mg total) by mouth daily.  . metFORMIN (GLUCOPHAGE) 1000 MG tablet Take 1 tablet (1,000 mg total) by mouth 2 (two) times daily with a meal.  . metoprolol tartrate (LOPRESSOR) 25 MG tablet Take 1 tablet (25 mg total) by mouth 2 (two) times daily.  . Multiple Vitamin (MULTIVITAMIN) tablet Take 1 tablet by mouth daily.  Marland Kitchen Phenyleph-Doxylamine-DM-APAP (ALKA SELTZER PLUS PO) Take 1 tablet by mouth daily as needed (stomach pain).  . potassium chloride (K-DUR) 10 MEQ tablet Take 1 tablet (10 mEq total) by mouth daily.  . pravastatin (PRAVACHOL) 40 MG tablet Take 1 tablet (40 mg total) by mouth daily.  . traMADol (ULTRAM) 50 MG tablet Take 1 tablet (50 mg total) by mouth every 6 (six) hours as needed.  Marland Kitchen VITAMIN D, CHOLECALCIFEROL, PO Take 1 tablet by mouth daily.  . Blood Glucose Calibration (ACCU-CHEK AVIVA) SOLN Use as directed   Review of Systems  Constitutional: Negative.   HENT: Negative.   Eyes: Negative.   Respiratory: Negative.   Cardiovascular: Positive for leg swelling.  Gastrointestinal: Negative.   Endocrine: Negative.   Musculoskeletal: Negative.  Skin: Negative.   Allergic/Immunologic: Negative.   Neurological: Negative.   Hematological: Negative.   Psychiatric/Behavioral: Negative.   All other systems reviewed and are negative.   BP 118/78  Pulse 71  Ht 5\' 6"  (1.676 m)  Wt 314 lb (142.429 kg)  BMI 50.70 kg/m2  Physical Exam  Nursing note and vitals reviewed. Constitutional: She is oriented to person, place, and time. She appears well-developed and well-nourished.   HENT:  Head: Normocephalic.  Nose: Nose normal.  Mouth/Throat: Oropharynx is clear and moist.  Eyes: Conjunctivae are normal. Pupils are equal, round, and reactive to light.  Neck: Normal range of motion. Neck supple. No JVD present.  Cardiovascular: Normal rate, regular rhythm, S1 normal, S2 normal, normal heart sounds and intact distal pulses.  Exam reveals no gallop and no friction rub.   No murmur heard. Woody edema bilaterally below the knees, trace  pitting  Pulmonary/Chest: Effort normal and breath sounds normal. No respiratory distress. She has no wheezes. She has no rales. She exhibits no tenderness.  Abdominal: Soft. Bowel sounds are normal. She exhibits no distension. There is no tenderness.  Musculoskeletal: Normal range of motion. She exhibits no edema and no tenderness.  Lymphadenopathy:    She has no cervical adenopathy.  Neurological: She is alert and oriented to person, place, and time. Coordination normal.  Skin: Skin is warm and dry. No rash noted. No erythema.  Psychiatric: She has a normal mood and affect. Her behavior is normal. Judgment and thought content normal.    Assessment and Plan

## 2014-03-28 NOTE — Assessment & Plan Note (Signed)
Chronic trace pitting edema today. Recommended she take Lasix twice a day. She is only taking Lasix daily

## 2014-03-28 NOTE — Assessment & Plan Note (Signed)
Blood pressure is well controlled on today's visit. No changes made to the medications. 

## 2014-03-28 NOTE — Assessment & Plan Note (Signed)
Cholesterol is not at goal. She has not been the most compliant with her statin. Recommended she take this on a regular basis. Discussed the importance of aggressive cholesterol control

## 2014-03-28 NOTE — Assessment & Plan Note (Signed)
Symptoms concerning for gastroparesis. She has gastric emptying study scheduled. Continues to have episodes of nausea

## 2014-03-28 NOTE — Assessment & Plan Note (Signed)
We have encouraged continued exercise, careful diet management in an effort to lose weight. 

## 2014-03-28 NOTE — Patient Instructions (Signed)
You are doing well. No medication changes were made.  Please call us if you have new issues that need to be addressed before your next appt.  Your physician wants you to follow-up in: 6 months.  You will receive a reminder letter in the mail two months in advance. If you don't receive a letter, please call our office to schedule the follow-up appointment.   

## 2014-05-20 ENCOUNTER — Telehealth: Payer: Self-pay | Admitting: Family Medicine

## 2014-05-21 ENCOUNTER — Telehealth: Payer: Self-pay | Admitting: *Deleted

## 2014-05-21 MED ORDER — ACCU-CHEK SOFTCLIX LANCETS MISC: Status: DC

## 2014-05-21 MED ORDER — ACCU-CHEK AVIVA PLUS W/DEVICE KIT: PACK | Status: DC

## 2014-05-21 MED ORDER — ACCU-CHEK AVIVA VI SOLN: Status: DC

## 2014-05-21 MED ORDER — BD SWAB SINGLE USE REGULAR PADS: MEDICATED_PAD | Status: DC

## 2014-05-21 MED ORDER — GLIPIZIDE ER 10 MG PO TB24: 10.0000 mg | ORAL_TABLET | Freq: Every day | ORAL | Status: DC

## 2014-05-21 MED ORDER — GLUCOSE BLOOD VI STRP: ORAL_STRIP | Status: DC

## 2014-05-21 MED ORDER — LISINOPRIL 20 MG PO TABS: 20.0000 mg | ORAL_TABLET | Freq: Every day | ORAL | Status: DC

## 2014-05-21 NOTE — Telephone Encounter (Addendum)
Received fax requesting refill on Accu-Chek [employer] testing supplies. Also requested refill on Glipizide and Lisinopril.   Prescription sent to pharmacy.   Received fax requesting refill on Ondansetron (Zofran). Medication not listed as active.   Last refill 02/03/2014.  Ok to refill?

## 2014-05-23 MED ORDER — ONDANSETRON HCL 4 MG PO TABS: 4.0000 mg | ORAL_TABLET | Freq: Four times a day (QID) | ORAL | Status: DC | PRN

## 2014-05-23 NOTE — Telephone Encounter (Signed)
ok 

## 2014-05-23 NOTE — Telephone Encounter (Signed)
Prescription sent to pharmacy.

## 2014-05-24 ENCOUNTER — Telehealth: Payer: Self-pay | Admitting: Family Medicine

## 2014-05-24 NOTE — Telephone Encounter (Signed)
Call placed to patient.   Reports that she has chronic back issues and the prednisone is effective in relieving inflammation.   States that she has inflammation in L side and her leg and back are extremely painful.   Reports that she has appointment on Monday with MD.   MD please advise.

## 2014-05-24 NOTE — Telephone Encounter (Signed)
Patient is calling to get refill on prednisone please call her and let her know if we can do this [phone]

## 2014-05-26 ENCOUNTER — Other Ambulatory Visit: Payer: Self-pay | Admitting: *Deleted

## 2014-05-26 MED ORDER — PREDNISONE 20 MG PO TABS: ORAL_TABLET | ORAL | Status: DC

## 2014-05-26 MED ORDER — LISINOPRIL 20 MG PO TABS: 20.0000 mg | ORAL_TABLET | Freq: Every day | ORAL | Status: DC

## 2014-05-26 NOTE — Telephone Encounter (Signed)
Ok with prednisone taper pack. 

## 2014-05-26 NOTE — Telephone Encounter (Signed)
Prescription sent to pharmacy. .   Call placed to patient and patient made aware.  

## 2014-05-26 NOTE — Telephone Encounter (Signed)
Received fax requesting refill on lisinopril.   Prescription sent to pharmacy.  

## 2014-05-30 ENCOUNTER — Ambulatory Visit (INDEPENDENT_AMBULATORY_CARE_PROVIDER_SITE_OTHER): Payer: Commercial Managed Care - HMO | Admitting: Family Medicine

## 2014-05-30 ENCOUNTER — Encounter: Payer: Self-pay | Admitting: Family Medicine

## 2014-05-30 VITALS — BP 140/80 | HR 78 | Temp 98.7°F | Resp 18 | Ht 66.0 in | Wt 322.0 lb

## 2014-05-30 DIAGNOSIS — E785 Hyperlipidemia, unspecified: Secondary | ICD-10-CM

## 2014-05-30 DIAGNOSIS — Z23 Encounter for immunization: Secondary | ICD-10-CM

## 2014-05-30 DIAGNOSIS — IMO0001 Reserved for inherently not codable concepts without codable children: Secondary | ICD-10-CM

## 2014-05-30 DIAGNOSIS — I10 Essential (primary) hypertension: Secondary | ICD-10-CM

## 2014-05-30 DIAGNOSIS — E1165 Type 2 diabetes mellitus with hyperglycemia: Principal | ICD-10-CM

## 2014-05-30 LAB — COMPLETE METABOLIC PANEL WITH GFR
ALT: 22 U/L (ref 0–35)
AST: 16 U/L (ref 0–37)
Albumin: 4 g/dL (ref 3.5–5.2)
Alkaline Phosphatase: 73 U/L (ref 39–117)
BILIRUBIN TOTAL: 0.5 mg/dL (ref 0.2–1.2)
BUN: 17 mg/dL (ref 6–23)
CO2: 26 meq/L (ref 19–32)
Calcium: 9.4 mg/dL (ref 8.4–10.5)
Chloride: 101 mEq/L (ref 96–112)
Creat: 0.83 mg/dL (ref 0.50–1.10)
GFR, Est Non African American: 79 mL/min
Glucose, Bld: 103 mg/dL — ABNORMAL HIGH (ref 70–99)
POTASSIUM: 4.2 meq/L (ref 3.5–5.3)
SODIUM: 137 meq/L (ref 135–145)
TOTAL PROTEIN: 6.6 g/dL (ref 6.0–8.3)

## 2014-05-30 LAB — MICROALBUMIN, URINE: Microalb, Ur: 0.9 mg/dL (ref <2.0)

## 2014-05-30 LAB — LIPID PANEL
Cholesterol: 160 mg/dL (ref 0–200)
HDL: 31 mg/dL — AB (ref >39)
LDL CALC: 103 mg/dL — AB (ref 0–99)
Total CHOL/HDL Ratio: 5.2 Ratio
Triglycerides: 131 mg/dL (ref <150)
VLDL: 26 mg/dL (ref 0–40)

## 2014-05-30 LAB — HEMOGLOBIN A1C
Hgb A1c MFr Bld: 7.3 % — ABNORMAL HIGH (ref <5.7)
Mean Plasma Glucose: 163 mg/dL — ABNORMAL HIGH (ref <117)

## 2014-05-30 MED ORDER — FUROSEMIDE 20 MG PO TABS: 20.0000 mg | ORAL_TABLET | Freq: Every day | ORAL | Status: DC

## 2014-05-30 MED ORDER — GABAPENTIN 300 MG PO CAPS: 300.0000 mg | ORAL_CAPSULE | Freq: Three times a day (TID) | ORAL | Status: DC

## 2014-05-30 MED ORDER — METFORMIN HCL 1000 MG PO TABS: 1000.0000 mg | ORAL_TABLET | Freq: Two times a day (BID) | ORAL | Status: DC

## 2014-05-30 MED ORDER — LISINOPRIL 20 MG PO TABS: 20.0000 mg | ORAL_TABLET | Freq: Every day | ORAL | Status: DC

## 2014-05-30 MED ORDER — METOPROLOL TARTRATE 25 MG PO TABS: 25.0000 mg | ORAL_TABLET | Freq: Two times a day (BID) | ORAL | Status: DC

## 2014-05-30 MED ORDER — PRAVASTATIN SODIUM 40 MG PO TABS: 40.0000 mg | ORAL_TABLET | Freq: Every day | ORAL | Status: DC

## 2014-05-30 MED ORDER — POTASSIUM CHLORIDE ER 10 MEQ PO TBCR: 10.0000 meq | EXTENDED_RELEASE_TABLET | Freq: Every day | ORAL | Status: DC

## 2014-05-30 NOTE — Addendum Note (Signed)
Addended by: Legrand Rams B on: 05/30/2014 12:37 PM   Modules accepted: Orders

## 2014-05-30 NOTE — Progress Notes (Signed)
Subjective:    Patient ID: Linda Briggs, female    DOB: 07-08-1956, 58 y.o.   MRN: [id number]  HPI Patient's last office visit, she was having right upper quadrant bowel pain nausea and vomiting. Gallbladder ultrasound and HIDA scan were normal. I believe the patient having gastroparesis due to her years of uncontrolled diabetes. She's had no further episodes but would still like to have some Zofran have on hand. She continues to have severe neuropathy in both feet. She has no sensation to 10 g monofilament on examination. She has +1 pitting edema to the level of her knees. She's gained 10 pounds since her last office visit. The patient admits that she is stopping her low carbohydrate diet. When I last saw the patient in June, her hemoglobin A1c was very good at 6.8 and her cholesterol is acceptable. The patient admits that since that office visit she is allow herself to fall into a poor diet. She is eating fast food quite frequently. She is unable to exercise.  Her diabetic eye exam is overdue. Past Medical History  Diagnosis Date  . Diabetes mellitus   . History of hepatitis B   . Neuropathy     feet  . Bulging disc   . Arthritis     feet & toes  . Hyperlipidemia   . Hypertension    Past Surgical History  Procedure Laterality Date  . Tonsillectomy     Current Outpatient Prescriptions on File Prior to Visit  Medication Sig Dispense Refill  . ACCU-CHEK SOFTCLIX LANCETS lancets Use to monitor FSBS 3x daily for fluctuating FSBS. DX: 250.00  100 each  12  . Alcohol Swabs (B-D SINGLE USE SWABS REGULAR) PADS Use to monitor FSBS 3x daily for fluctuating FSBS. DX: 250.00  300 each  1  . aspirin 325 MG tablet Take 325 mg by mouth daily.      . Blood Glucose Calibration (ACCU-CHEK AVIVA) SOLN Use to monitor FSBS 3x daily for fluctuating FSBS. DX: 250.00  3 each  3  . Blood Glucose Monitoring Suppl (ACCU-CHEK AVIVA PLUS) W/DEVICE KIT Use to monitor FSBS 3x daily for fluctuating FSBS. DX:  250.00  1 kit  0  . Cyanocobalamin (VITAMIN B 12 PO) Take 500 mcg by mouth daily.      . furosemide (LASIX) 20 MG tablet Take 1 tablet (20 mg total) by mouth daily.  90 tablet  3  . gabapentin (NEURONTIN) 300 MG capsule Take 1 capsule (300 mg total) by mouth 3 (three) times daily.  270 capsule  3  . glipiZIDE (GLUCOTROL XL) 10 MG 24 hr tablet Take 1 tablet (10 mg total) by mouth daily with breakfast.  90 tablet  3  . glucose blood (ACCU-CHEK AVIVA) test strip Use to monitor FSBS 3x daily for fluctuating FSBS. DX: 250.00  100 each  12  . insulin NPH-regular Human (NOVOLIN 70/30) (70-30) 100 UNIT/ML injection Inject 48-70 Units into the skin See admin instructions. Inject 70 units into the skin in the morning, then 50 units into the skin at bedtime      . lisinopril (PRINIVIL,ZESTRIL) 20 MG tablet Take 1 tablet (20 mg total) by mouth daily.  90 tablet  3  . metFORMIN (GLUCOPHAGE) 1000 MG tablet Take 1 tablet (1,000 mg total) by mouth 2 (two) times daily with a meal.  180 tablet  3  . metoprolol tartrate (LOPRESSOR) 25 MG tablet Take 1 tablet (25 mg total) by mouth 2 (two) times daily.  [place]  tablet  3  . Multiple Vitamin (MULTIVITAMIN) tablet Take 1 tablet by mouth daily.      . ondansetron (ZOFRAN) 4 MG tablet Take 1 tablet (4 mg total) by mouth every 6 (six) hours as needed for nausea or vomiting.  60 tablet  0  . Phenyleph-Doxylamine-DM-APAP (ALKA SELTZER PLUS PO) Take 1 tablet by mouth daily as needed (stomach pain).      . potassium chloride (K-DUR) 10 MEQ tablet Take 1 tablet (10 mEq total) by mouth daily.  90 tablet  3  . pravastatin (PRAVACHOL) 40 MG tablet Take 1 tablet (40 mg total) by mouth daily.  90 tablet  3  . predniSONE (DELTASONE) 20 MG tablet Take 3 tablets (35m) on days 1-2. Take 2 tablets (476m on days 3-4. Take 1 tablet (2059mon days 5-6, then stop.  12 tablet  0  . traMADol (ULTRAM) 50 MG tablet Take 1 tablet (50 mg total) by mouth every 6 (six) hours as needed.  60 tablet  2  .  VITAMIN D, CHOLECALCIFEROL, PO Take 1 tablet by mouth daily.       No current facility-administered medications on file prior to visit.   Allergies  Allergen Reactions  . Codeine Anaphylaxis and Hives  . Penicillins Anaphylaxis and Hives  . Food Nausea And Vomiting    Olives   . Januvia [Sitagliptin]     Stomach cramps  . Latex Hives   History   Social History  . Marital Status: Married    Spouse Name: N/A    Number of Children: N/A  . Years of Education: N/A   Occupational History  . Not on file.   Social History Main Topics  . Smoking status: Never Smoker   . Smokeless tobacco: Never Used  . Alcohol Use: 0.0 oz/week     Comment: social drinker.  . Drug Use: No  . Sexual Activity: Not on file   Other Topics Concern  . Not on file   Social History Narrative  . No narrative on file      Review of Systems  All other systems reviewed and are negative.      Objective:   Physical Exam  Vitals reviewed. Constitutional: She appears well-developed and well-nourished. No distress.  Neck: Neck supple. No JVD present. No thyromegaly present.  Cardiovascular: Normal rate, regular rhythm, normal heart sounds and intact distal pulses.   No murmur heard. Pulmonary/Chest: Effort normal and breath sounds normal. No respiratory distress. She has no wheezes. She has no rales.  Abdominal: Soft. Bowel sounds are normal. She exhibits no distension and no mass. There is no tenderness. There is no rebound and no guarding.  Musculoskeletal: She exhibits edema.  Lymphadenopathy:    She has no cervical adenopathy.  Skin: She is not diaphoretic.          Assessment & Plan:  Type II or unspecified type diabetes mellitus without mention of complication, uncontrolled - Plan: COMPLETE METABOLIC PANEL WITH GFR, Lipid panel, Hemoglobin A1c, Microalbumin, urine  HLD (hyperlipidemia) - Plan: COMPLETE METABOLIC PANEL WITH GFR, Lipid panel  Essential hypertension - Plan: COMPLETE  METABOLIC PANEL WITH GFR    Blood pressure is acceptable. I explained to the patient that her diet is likely exacerbating her sugars. I feel that this will likely worsen her neuropathy and also to complicate her gastroparesis. I have recommended that she resume her low carbohydrate diet. I would like to check a hemoglobin A1c as well as a urine microalbumin to  see changes that have occurred since she stopped her low carbohydrate diet. Also check a fasting lipid panel. Also schedule patient for diabetic eye exam.  I offered the patient flu vaccine but she declined.

## 2014-05-30 NOTE — Addendum Note (Signed)
Addended by: Legrand Rams B on: 05/30/2014 12:45 PM   Modules accepted: Orders

## 2014-06-02 ENCOUNTER — Other Ambulatory Visit: Payer: Self-pay | Admitting: *Deleted

## 2014-06-02 MED ORDER — INSULIN NPH ISOPHANE & REGULAR (70-30) 100 UNIT/ML ~~LOC~~ SUSP: SUBCUTANEOUS | Status: DC

## 2014-06-02 NOTE — Telephone Encounter (Signed)
Received call from patient.   Requested refill on insulin.   Refill appropriate and filled per protocol.

## 2014-06-09 ENCOUNTER — Other Ambulatory Visit: Payer: Self-pay | Admitting: *Deleted

## 2014-06-09 NOTE — Telephone Encounter (Signed)
Received fax requesting to change Zestril to generic Lisinopril.  Prescription was sent to pharmacy on 05/30/2014 with the directions to dispense Lisinopril.   Call placed to pharmacy to inquire.   States that new order for Lisinopril was shipped out to patient on 06/02/2014.

## 2014-08-09 ENCOUNTER — Telehealth: Payer: Self-pay | Admitting: *Deleted

## 2014-08-09 NOTE — Telephone Encounter (Signed)
Submitted humana referral thru acuity connect for authorization for [facility] for Diabetic Eye exam with authorization number [id number], [facility] paper [facility] received [facility]

## 2014-08-16 LAB — HM DIABETES EYE EXAM

## 2014-08-29 ENCOUNTER — Encounter: Payer: Self-pay | Admitting: Family Medicine

## 2014-08-29 ENCOUNTER — Ambulatory Visit (INDEPENDENT_AMBULATORY_CARE_PROVIDER_SITE_OTHER): Payer: Commercial Managed Care - HMO | Admitting: Family Medicine

## 2014-08-29 VITALS — BP 130/70 | HR 76 | Temp 98.6°F | Resp 18 | Ht 66.0 in | Wt 300.0 lb

## 2014-08-29 DIAGNOSIS — E1165 Type 2 diabetes mellitus with hyperglycemia: Secondary | ICD-10-CM

## 2014-08-29 DIAGNOSIS — IMO0002 Reserved for concepts with insufficient information to code with codable children: Secondary | ICD-10-CM

## 2014-08-29 DIAGNOSIS — R5382 Chronic fatigue, unspecified: Secondary | ICD-10-CM | POA: Diagnosis not present

## 2014-08-29 DIAGNOSIS — H3500 Unspecified background retinopathy: Secondary | ICD-10-CM

## 2014-08-29 DIAGNOSIS — H3509 Other intraretinal microvascular abnormalities: Secondary | ICD-10-CM | POA: Diagnosis not present

## 2014-08-29 DIAGNOSIS — Z23 Encounter for immunization: Secondary | ICD-10-CM | POA: Diagnosis not present

## 2014-08-29 LAB — CBC WITH DIFFERENTIAL/PLATELET
Basophils Absolute: 0.1 10*3/uL (ref 0.0–0.1)
Basophils Relative: 1 % (ref 0–1)
EOS PCT: 2 % (ref 0–5)
Eosinophils Absolute: 0.2 10*3/uL (ref 0.0–0.7)
HCT: 41.4 % (ref 36.0–46.0)
HEMOGLOBIN: 14.3 g/dL (ref 12.0–15.0)
LYMPHS ABS: 2.5 10*3/uL (ref 0.7–4.0)
LYMPHS PCT: 25 % (ref 12–46)
MCH: 32.1 pg (ref 26.0–34.0)
MCHC: 34.5 g/dL (ref 30.0–36.0)
MCV: 93 fL (ref 78.0–100.0)
MPV: 9.4 fL (ref 9.4–12.4)
Monocytes Absolute: 0.6 10*3/uL (ref 0.1–1.0)
Monocytes Relative: 6 % (ref 3–12)
NEUTROS PCT: 66 % (ref 43–77)
Neutro Abs: 6.6 10*3/uL (ref 1.7–7.7)
Platelets: 185 10*3/uL (ref 150–400)
RBC: 4.45 MIL/uL (ref 3.87–5.11)
RDW: 13.8 % (ref 11.5–15.5)
WBC: 10 10*3/uL (ref 4.0–10.5)

## 2014-08-29 LAB — COMPLETE METABOLIC PANEL WITH GFR
ALT: 17 U/L (ref 0–35)
AST: 16 U/L (ref 0–37)
Albumin: 4 g/dL (ref 3.5–5.2)
Alkaline Phosphatase: 85 U/L (ref 39–117)
BILIRUBIN TOTAL: 0.6 mg/dL (ref 0.2–1.2)
BUN: 20 mg/dL (ref 6–23)
CO2: 23 meq/L (ref 19–32)
CREATININE: 0.9 mg/dL (ref 0.50–1.10)
Calcium: 9.5 mg/dL (ref 8.4–10.5)
Chloride: 103 mEq/L (ref 96–112)
GFR, Est African American: 82 mL/min
GFR, Est Non African American: 71 mL/min
Glucose, Bld: 201 mg/dL — ABNORMAL HIGH (ref 70–99)
POTASSIUM: 4.8 meq/L (ref 3.5–5.3)
Sodium: 139 mEq/L (ref 135–145)
Total Protein: 6.7 g/dL (ref 6.0–8.3)

## 2014-08-29 LAB — LIPID PANEL
CHOLESTEROL: 155 mg/dL (ref 0–200)
HDL: 26 mg/dL — ABNORMAL LOW (ref >39)
LDL Cholesterol: 104 mg/dL — ABNORMAL HIGH (ref 0–99)
Total CHOL/HDL Ratio: 6 Ratio
Triglycerides: 123 mg/dL (ref <150)
VLDL: 25 mg/dL (ref 0–40)

## 2014-08-29 LAB — MICROALBUMIN, URINE: Microalb, Ur: 3.2 mg/dL — ABNORMAL HIGH (ref <2.0)

## 2014-08-29 LAB — HEMOGLOBIN A1C
Hgb A1c MFr Bld: 7.1 % — ABNORMAL HIGH (ref <5.7)
Mean Plasma Glucose: 157 mg/dL — ABNORMAL HIGH (ref <117)

## 2014-08-29 LAB — TSH: TSH: 3.185 u[IU]/mL (ref 0.350–4.500)

## 2014-08-29 NOTE — Progress Notes (Signed)
Subjective:    Patient ID: Linda Briggs, female    DOB: 17-Mar-1956, 58 y.o.   MRN: [id number]  HPI A she is here today for follow-up of her diabetes. She is not exercising. Her weight continues to remain significantly elevated at 300 pounds. She denies any hypoglycemic episodes. She continues to have neuropathic pain in both feet. Both feet are completely numb. She has severe chronic venous stasis changes to both legs and both feet. 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 Patient's blood pressure today is well controlled at 130/70 ear and she denies any chest pain although she does report dyspnea on exertion. Echocardiogram performed in 2013 revealed an ejection fraction of 55-65% with no evidence of congestive heart failure. Patient has never had a stress test. She denies any chest pain but she does get easily winded with minimal exertion. Most of this appears to be deconditioning due to her relative inactivity. I recommended the patient discuss this with her cardiologist to determine if they feel a  stress test is warranted.  Patient also reports severe chronic fatigue. She has very little energy. She reports excessive daytime somnolence. Past Medical History  Diagnosis Date  . Diabetes mellitus   . History of hepatitis B   . Neuropathy     feet  . Bulging disc   . Arthritis     feet & toes  . Hyperlipidemia   . Hypertension    Past Surgical History  Procedure Laterality Date  . Tonsillectomy     Current Outpatient Prescriptions on File Prior to Visit  Medication Sig Dispense Refill  . ACCU-CHEK SOFTCLIX LANCETS lancets Use to monitor FSBS 3x daily for fluctuating FSBS. DX: 250.00 100 each 12  . Alcohol Swabs (B-D SINGLE USE SWABS REGULAR) PADS Use to monitor FSBS 3x daily for fluctuating FSBS. DX: 250.00 300 each 1  . aspirin 325 MG tablet Take 325 mg by mouth daily.    . Blood Glucose Calibration (ACCU-CHEK  AVIVA) SOLN Use to monitor FSBS 3x daily for fluctuating FSBS. DX: 250.00 3 each 3  . Blood Glucose Monitoring Suppl (ACCU-CHEK AVIVA PLUS) W/DEVICE KIT Use to monitor FSBS 3x daily for fluctuating FSBS. DX: 250.00 1 kit 0  . Cyanocobalamin (VITAMIN B 12 PO) Take 500 mcg by mouth daily.    . furosemide (LASIX) 20 MG tablet Take 1 tablet (20 mg total) by mouth daily. 90 tablet 3  . gabapentin (NEURONTIN) 300 MG capsule Take 1 capsule (300 mg total) by mouth 3 (three) times daily. 270 capsule 3  . glipiZIDE (GLUCOTROL XL) 10 MG 24 hr tablet Take 1 tablet (10 mg total) by mouth daily with breakfast. 90 tablet 3  . glucose blood (ACCU-CHEK AVIVA) test strip Use to monitor FSBS 3x daily for fluctuating FSBS. DX: 250.00 100 each 12  . insulin NPH-regular Human (NOVOLIN 70/30) (70-30) 100 UNIT/ML injection Inject 70 units into the skin in the morning, then 50 units into the skin at bedtime (Patient taking differently: Inject 70 units into the skin in the morning, then 60 units into the skin at bedtime) 10 vial 3  . lisinopril (PRINIVIL,ZESTRIL) 20 MG tablet Take 1 tablet (20 mg total) by mouth daily. 90 tablet 3  . metFORMIN (GLUCOPHAGE) 1000 MG tablet Take 1 tablet (1,000 mg total) by mouth 2 (two) times daily with a meal. 180 tablet 3  . metoprolol tartrate (LOPRESSOR) 25 MG tablet Take 1 tablet (25 mg total) by  mouth 2 (two) times daily. 180 tablet 3  . Multiple Vitamin (MULTIVITAMIN) tablet Take 1 tablet by mouth daily.    . ondansetron (ZOFRAN) 4 MG tablet Take 1 tablet (4 mg total) by mouth every 6 (six) hours as needed for nausea or vomiting. 60 tablet 0  . Phenyleph-Doxylamine-DM-APAP (ALKA SELTZER PLUS PO) Take 1 tablet by mouth daily as needed (stomach pain).    . potassium chloride (K-DUR) 10 MEQ tablet Take 1 tablet (10 mEq total) by mouth daily. 90 tablet 3  . pravastatin (PRAVACHOL) 40 MG tablet Take 1 tablet (40 mg total) by mouth daily. 90 tablet 3  . traMADol (ULTRAM) 50 MG tablet Take 1  tablet (50 mg total) by mouth every 6 (six) hours as needed. 60 tablet 2  . VITAMIN D, CHOLECALCIFEROL, PO Take 1 tablet by mouth daily.     No current facility-administered medications on file prior to visit.   Allergies  Allergen Reactions  . Codeine Anaphylaxis and Hives  . Penicillins Anaphylaxis and Hives  . Food Nausea And Vomiting    Olives   . Januvia [Sitagliptin]     Stomach cramps  . Latex Hives   History   Social History  . Marital Status: Married    Spouse Name: N/A    Number of Children: N/A  . Years of Education: N/A   Occupational History  . Not on file.   Social History Main Topics  . Smoking status: Never Smoker   . Smokeless tobacco: Never Used  . Alcohol Use: 0.0 oz/week     Comment: social drinker.  . Drug Use: No  . Sexual Activity: Not on file   Other Topics Concern  . Not on file   Social History Narrative      Review of Systems  All other systems reviewed and are negative.      Objective:   Physical Exam  Constitutional: She appears well-developed and well-nourished.  Neck: Neck supple. No JVD present. No thyromegaly present.  Cardiovascular: Normal rate, regular rhythm and normal heart sounds.   No murmur heard. Pulmonary/Chest: Effort normal and breath sounds normal. No respiratory distress. She has no wheezes. She has no rales.  Abdominal: Soft. Bowel sounds are normal. She exhibits no distension. There is no tenderness. There is no rebound and no guarding.  Musculoskeletal: She exhibits edema.  Lymphadenopathy:    She has no cervical adenopathy.  Vitals reviewed.         Assessment & Plan:  Diabetes mellitus type II, uncontrolled - Plan: COMPLETE METABOLIC PANEL WITH GFR, CBC with Differential, Lipid panel, Hemoglobin A1c, Microalbumin, urine, CBC with Differential  Chronic fatigue - Plan: TSH, Ambulatory referral to Sleep Studies  Need for prophylactic vaccination against Streptococcus pneumoniae (pneumococcus) -  Plan: Pneumococcal polysaccharide vaccine 23-valent greater than or equal to 2yo subcutaneous/IM  Retinopathy, vascular - Plan: Carotid duplex  I will check the patient's hemoglobin A1c along with urine microalbumin. I will also check a fasting lipid panel. Goal LDL cholesterol is less than 100. Goal hemoglobin A1c is less than 7. Given her chronic fatigue I will also check a CBC as well as a TSH. I will also schedule the patient for a sleep study as I believe the patient most likely has obstructive sleep apnea. The patient received her Pneumovax 23 today. Given her vascular retinopathy I will schedule the patient for carotid Dopplers as requested by her ophthalmologist.

## 2014-08-30 ENCOUNTER — Encounter: Payer: Self-pay | Admitting: Family Medicine

## 2014-09-06 ENCOUNTER — Ambulatory Visit (HOSPITAL_COMMUNITY): Payer: Commercial Managed Care - HMO | Attending: Family Medicine | Admitting: Cardiology

## 2014-09-06 DIAGNOSIS — I6523 Occlusion and stenosis of bilateral carotid arteries: Secondary | ICD-10-CM | POA: Diagnosis not present

## 2014-09-06 DIAGNOSIS — H3509 Other intraretinal microvascular abnormalities: Secondary | ICD-10-CM

## 2014-09-06 DIAGNOSIS — H539 Unspecified visual disturbance: Secondary | ICD-10-CM | POA: Diagnosis not present

## 2014-09-06 DIAGNOSIS — H3500 Unspecified background retinopathy: Secondary | ICD-10-CM | POA: Insufficient documentation

## 2014-09-06 NOTE — Progress Notes (Signed)
Carotid duplex performed 

## 2014-09-07 ENCOUNTER — Other Ambulatory Visit: Payer: Self-pay | Admitting: Family Medicine

## 2014-09-07 DIAGNOSIS — G4700 Insomnia, unspecified: Secondary | ICD-10-CM

## 2014-09-07 DIAGNOSIS — R5382 Chronic fatigue, unspecified: Secondary | ICD-10-CM

## 2014-09-09 ENCOUNTER — Other Ambulatory Visit: Payer: Self-pay | Admitting: Family Medicine

## 2014-09-09 DIAGNOSIS — R9389 Abnormal findings on diagnostic imaging of other specified body structures: Secondary | ICD-10-CM

## 2014-09-09 MED ORDER — LISINOPRIL 40 MG PO TABS: 40.0000 mg | ORAL_TABLET | Freq: Every day | ORAL | Status: DC

## 2014-09-12 ENCOUNTER — Telehealth: Payer: Self-pay | Admitting: *Deleted

## 2014-09-12 NOTE — Telephone Encounter (Signed)
Submitted humana referral thru acuity connect for authorization for US Thyroid Neck and Head DX:R94.6-abnormal results of Thyroid FX studies, authorization number is [id number] [facility]

## 2014-09-16 ENCOUNTER — Ambulatory Visit (HOSPITAL_COMMUNITY)
Admission: RE | Admit: 2014-09-16 | Discharge: 2014-09-16 | Disposition: A | Discharge status: Home / Self Care | Payer: Commercial Managed Care - HMO | Source: Ambulatory Visit | Attending: Family Medicine | Admitting: Family Medicine

## 2014-09-16 DIAGNOSIS — E041 Nontoxic single thyroid nodule: Secondary | ICD-10-CM | POA: Insufficient documentation

## 2014-09-16 DIAGNOSIS — E042 Nontoxic multinodular goiter: Secondary | ICD-10-CM | POA: Insufficient documentation

## 2014-09-16 DIAGNOSIS — R9389 Abnormal findings on diagnostic imaging of other specified body structures: Secondary | ICD-10-CM

## 2014-09-20 ENCOUNTER — Telehealth: Payer: Self-pay | Admitting: Family Medicine

## 2014-09-20 DIAGNOSIS — E049 Nontoxic goiter, unspecified: Secondary | ICD-10-CM

## 2014-09-20 NOTE — Telephone Encounter (Signed)
-----   Message from Donita BrooksWarren T Pickard, MD sent at 09/19/2014  7:12 AM EST ----- US shows multinodular goiter.  1 nodule is bordeline in size and they recommend US guided fine needle biopsy to rule out malignancy which I feel is VERY unlikely.  Please schedule with interventional radiology.

## 2014-09-20 NOTE — Telephone Encounter (Signed)
Pt aware of ultrasound results and provider recommendations.  Referral for Int. Radiology placed.

## 2014-09-21 ENCOUNTER — Other Ambulatory Visit: Payer: Self-pay | Admitting: Family Medicine

## 2014-09-21 DIAGNOSIS — E049 Nontoxic goiter, unspecified: Secondary | ICD-10-CM

## 2014-09-27 ENCOUNTER — Ambulatory Visit
Admission: RE | Admit: 2014-09-27 | Discharge: 2014-09-27 | Disposition: A | Discharge status: Home / Self Care | Payer: Commercial Managed Care - HMO | Source: Ambulatory Visit | Attending: Family Medicine | Admitting: Family Medicine

## 2014-09-27 ENCOUNTER — Other Ambulatory Visit (HOSPITAL_COMMUNITY)
Admission: RE | Admit: 2014-09-27 | Discharge: 2014-09-27 | Disposition: A | Discharge status: Home / Self Care | Payer: Commercial Managed Care - HMO | Source: Ambulatory Visit | Attending: Interventional Radiology | Admitting: Interventional Radiology

## 2014-09-27 DIAGNOSIS — E041 Nontoxic single thyroid nodule: Secondary | ICD-10-CM | POA: Insufficient documentation

## 2014-09-27 DIAGNOSIS — E049 Nontoxic goiter, unspecified: Secondary | ICD-10-CM

## 2014-10-04 DIAGNOSIS — E119 Type 2 diabetes mellitus without complications: Secondary | ICD-10-CM | POA: Diagnosis not present

## 2014-10-04 DIAGNOSIS — H35363 Drusen (degenerative) of macula, bilateral: Secondary | ICD-10-CM | POA: Diagnosis not present

## 2014-10-04 DIAGNOSIS — H3563 Retinal hemorrhage, bilateral: Secondary | ICD-10-CM | POA: Diagnosis not present

## 2014-10-19 ENCOUNTER — Ambulatory Visit (HOSPITAL_BASED_OUTPATIENT_CLINIC_OR_DEPARTMENT_OTHER): Payer: Commercial Managed Care - HMO | Attending: Family Medicine | Admitting: Radiology

## 2014-10-19 VITALS — Ht 66.0 in | Wt 314.0 lb

## 2014-10-19 DIAGNOSIS — R5382 Chronic fatigue, unspecified: Secondary | ICD-10-CM | POA: Diagnosis not present

## 2014-10-19 DIAGNOSIS — G4700 Insomnia, unspecified: Secondary | ICD-10-CM | POA: Diagnosis not present

## 2014-10-22 DIAGNOSIS — R5382 Chronic fatigue, unspecified: Secondary | ICD-10-CM

## 2014-10-22 DIAGNOSIS — G4700 Insomnia, unspecified: Secondary | ICD-10-CM

## 2014-10-22 NOTE — Sleep Study (Signed)
   NAME: [name] DATE OF BIRTH:  03/17/1956 MEDICAL RECORD NUMBER [id number]  LOCATION: [facility]  PHYSICIAN: Jamie-Lee Galdamez D  DATE OF STUDY: 10/19/2014  SLEEP STUDY TYPE: Nocturnal Polysomnogram               REFERRING PHYSICIAN: Donita BrooksPickard, Warren T, MD  INDICATION FOR STUDY: Insomnia with sleep apnea  EPWORTH SLEEPINESS SCORE:   14/24 HEIGHT: 5\' 6"  (167.6 cm)  WEIGHT: (!) 314 lb (142.429 kg)    Body mass index is 50.7 kg/(m^2).  NECK SIZE: 19.5 in.  MEDICATIONS: Charted for review  SLEEP ARCHITECTURE: Total sleep time 268.5 minutes with sleep efficiency 72%. Stage I was 5.8%, stage II 78.2%, stage III absent, REM 16% of total sleep time. Sleep latency 26.5 minutes, REM latency 275 minutes, awake after sleep onset 78 minutes, arousal index 15.2, bedtime medication: None  RESPIRATORY DATA: Apnea hypopnea index (AHI) 21.7 per hour. 97 total events scored including 2 obstructive apneas and 95 hypopneas. Events in all sleep positions. REM AHI 41.9 per hour. There were not enough early events to permit application of split protocol CPAP titration on this study night  OXYGEN DATA: Moderately loud snoring with oxygen desaturation to a nadir of 75% and mean saturation 90.4% on room air  CARDIAC DATA: Sinus rhythm with occasional PVC  MOVEMENT/PARASOMNIA: No significant movement disturbance, bathroom 1  IMPRESSION/ RECOMMENDATION:   1) Moderate obstructive sleep apnea/hypopnea syndrome, AHI 21.7 per hour with non-positional events. REM AHI 41.9 per hour moderately loud snoring with oxygen desaturation to a nadir of 75% and mean saturation 90.4% on room air. 2) There were not enough early events to meet protocol requirements for split protocol CPAP titration on this study night. This patient can return for a dedicated CPAP titration study if appropriate.    Waymon BudgeYOUNG,Naisha Wisdom D Diplomate, American Board of Sleep Medicine  ELECTRONICALLY SIGNED ON:  10/22/2014,  11:19 AM [facility] PH: [phone]   FX: [phone] ACCREDITED BY THE AMERICAN ACADEMY OF SLEEP MEDICINE

## 2014-10-25 ENCOUNTER — Other Ambulatory Visit: Payer: Self-pay | Admitting: Family Medicine

## 2014-10-25 NOTE — Telephone Encounter (Signed)
?   OK to Refill  

## 2014-10-25 NOTE — Telephone Encounter (Signed)
ok 

## 2014-11-01 ENCOUNTER — Telehealth: Payer: Self-pay | Admitting: Family Medicine

## 2014-11-01 NOTE — Telephone Encounter (Signed)
Pt is aware of sleep study results and the need for a CPAP titration but elects to wait. Pt instructed to call back when she would like the to be set up and we will be glad to that for her. Pt verbalizes understanding.

## 2014-11-02 ENCOUNTER — Other Ambulatory Visit: Payer: Self-pay | Admitting: *Deleted

## 2014-11-02 ENCOUNTER — Telehealth: Payer: Self-pay | Admitting: Family Medicine

## 2014-11-02 MED ORDER — METFORMIN HCL 1000 MG PO TABS: 1000.0000 mg | ORAL_TABLET | Freq: Two times a day (BID) | ORAL | Status: DC

## 2014-11-02 MED ORDER — LISINOPRIL 40 MG PO TABS: 40.0000 mg | ORAL_TABLET | Freq: Every day | ORAL | Status: DC

## 2014-11-02 MED ORDER — FUROSEMIDE 20 MG PO TABS: 20.0000 mg | ORAL_TABLET | Freq: Every day | ORAL | Status: DC

## 2014-11-02 MED ORDER — POTASSIUM CHLORIDE ER 10 MEQ PO TBCR: 10.0000 meq | EXTENDED_RELEASE_TABLET | Freq: Every day | ORAL | Status: DC

## 2014-11-02 MED ORDER — METOPROLOL TARTRATE 25 MG PO TABS: 25.0000 mg | ORAL_TABLET | Freq: Two times a day (BID) | ORAL | Status: DC

## 2014-11-02 MED ORDER — GLIPIZIDE ER 10 MG PO TB24: 10.0000 mg | ORAL_TABLET | Freq: Every day | ORAL | Status: DC

## 2014-11-02 MED ORDER — INSULIN NPH ISOPHANE & REGULAR (70-30) 100 UNIT/ML ~~LOC~~ SUSP: SUBCUTANEOUS | Status: DC

## 2014-11-02 MED ORDER — ONDANSETRON HCL 4 MG PO TABS: 4.0000 mg | ORAL_TABLET | Freq: Four times a day (QID) | ORAL | Status: DC | PRN

## 2014-11-02 MED ORDER — BD SWAB SINGLE USE REGULAR PADS: MEDICATED_PAD | Status: DC

## 2014-11-02 MED ORDER — GABAPENTIN 300 MG PO CAPS: 300.0000 mg | ORAL_CAPSULE | Freq: Three times a day (TID) | ORAL | Status: DC

## 2014-11-02 MED ORDER — ACCU-CHEK AVIVA VI SOLN: Status: DC

## 2014-11-02 NOTE — Telephone Encounter (Signed)
Received fax requesting refill on  DM Supplies.   Refill appropriate and filled per protocol. 

## 2014-11-02 NOTE — Telephone Encounter (Signed)
Medication refilled per protocol. 

## 2014-11-18 ENCOUNTER — Other Ambulatory Visit: Payer: Self-pay | Admitting: Family Medicine

## 2014-11-24 ENCOUNTER — Telehealth: Payer: Self-pay | Admitting: *Deleted

## 2014-11-24 NOTE — Telephone Encounter (Signed)
Submitted humana referral thru acuity connect for authorization to Dr. 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 Mariah MillingGollan office

## 2014-11-29 ENCOUNTER — Ambulatory Visit (INDEPENDENT_AMBULATORY_CARE_PROVIDER_SITE_OTHER): Payer: Commercial Managed Care - HMO | Admitting: Family Medicine

## 2014-11-29 ENCOUNTER — Other Ambulatory Visit: Payer: Commercial Managed Care - HMO

## 2014-11-29 ENCOUNTER — Ambulatory Visit (INDEPENDENT_AMBULATORY_CARE_PROVIDER_SITE_OTHER): Payer: Commercial Managed Care - HMO | Admitting: Cardiovascular Disease

## 2014-11-29 ENCOUNTER — Encounter: Payer: Self-pay | Admitting: Family Medicine

## 2014-11-29 ENCOUNTER — Encounter: Payer: Self-pay | Admitting: Cardiovascular Disease

## 2014-11-29 VITALS — BP 110/64 | HR 70 | Ht 66.0 in | Wt 320.5 lb

## 2014-11-29 VITALS — BP 114/66 | HR 76 | Temp 98.2°F | Resp 18 | Wt 324.0 lb

## 2014-11-29 DIAGNOSIS — E785 Hyperlipidemia, unspecified: Secondary | ICD-10-CM

## 2014-11-29 DIAGNOSIS — E1165 Type 2 diabetes mellitus with hyperglycemia: Secondary | ICD-10-CM | POA: Diagnosis not present

## 2014-11-29 DIAGNOSIS — N950 Postmenopausal bleeding: Secondary | ICD-10-CM | POA: Diagnosis not present

## 2014-11-29 DIAGNOSIS — IMO0002 Reserved for concepts with insufficient information to code with codable children: Secondary | ICD-10-CM

## 2014-11-29 DIAGNOSIS — E119 Type 2 diabetes mellitus without complications: Secondary | ICD-10-CM | POA: Diagnosis not present

## 2014-11-29 DIAGNOSIS — I10 Essential (primary) hypertension: Secondary | ICD-10-CM

## 2014-11-29 DIAGNOSIS — Z79899 Other long term (current) drug therapy: Secondary | ICD-10-CM

## 2014-11-29 DIAGNOSIS — E118 Type 2 diabetes mellitus with unspecified complications: Secondary | ICD-10-CM | POA: Diagnosis not present

## 2014-11-29 DIAGNOSIS — R0602 Shortness of breath: Secondary | ICD-10-CM

## 2014-11-29 DIAGNOSIS — R002 Palpitations: Secondary | ICD-10-CM | POA: Diagnosis not present

## 2014-11-29 DIAGNOSIS — R1013 Epigastric pain: Secondary | ICD-10-CM | POA: Diagnosis not present

## 2014-11-29 DIAGNOSIS — R609 Edema, unspecified: Secondary | ICD-10-CM

## 2014-11-29 DIAGNOSIS — I159 Secondary hypertension, unspecified: Secondary | ICD-10-CM | POA: Diagnosis not present

## 2014-11-29 LAB — CBC WITH DIFFERENTIAL/PLATELET
Basophils Absolute: 0 10*3/uL (ref 0.0–0.1)
Basophils Relative: 0 % (ref 0–1)
EOS PCT: 2 % (ref 0–5)
Eosinophils Absolute: 0.2 10*3/uL (ref 0.0–0.7)
HCT: 39.9 % (ref 36.0–46.0)
HEMOGLOBIN: 13.4 g/dL (ref 12.0–15.0)
LYMPHS PCT: 26 % (ref 12–46)
Lymphs Abs: 2.8 10*3/uL (ref 0.7–4.0)
MCH: 31.8 pg (ref 26.0–34.0)
MCHC: 33.6 g/dL (ref 30.0–36.0)
MCV: 94.5 fL (ref 78.0–100.0)
MONO ABS: 0.5 10*3/uL (ref 0.1–1.0)
MONOS PCT: 5 % (ref 3–12)
MPV: 9.5 fL (ref 8.6–12.4)
Neutro Abs: 7.1 10*3/uL (ref 1.7–7.7)
Neutrophils Relative %: 67 % (ref 43–77)
PLATELETS: 149 10*3/uL — AB (ref 150–400)
RBC: 4.22 MIL/uL (ref 3.87–5.11)
RDW: 14.2 % (ref 11.5–15.5)
WBC: 10.6 10*3/uL — ABNORMAL HIGH (ref 4.0–10.5)

## 2014-11-29 LAB — COMPLETE METABOLIC PANEL WITH GFR
ALT: 16 U/L (ref 0–35)
AST: 16 U/L (ref 0–37)
Albumin: 3.8 g/dL (ref 3.5–5.2)
Alkaline Phosphatase: 73 U/L (ref 39–117)
BUN: 16 mg/dL (ref 6–23)
CO2: 29 meq/L (ref 19–32)
Calcium: 9.2 mg/dL (ref 8.4–10.5)
Chloride: 103 mEq/L (ref 96–112)
Creat: 0.72 mg/dL (ref 0.50–1.10)
GFR, Est African American: >89 mL/min
GFR, Est Non African American: >89 mL/min
Glucose, Bld: 147 mg/dL — ABNORMAL HIGH (ref 70–99)
Potassium: 4.6 mEq/L (ref 3.5–5.3)
Sodium: 139 mEq/L (ref 135–145)
TOTAL PROTEIN: 6.6 g/dL (ref 6.0–8.3)
Total Bilirubin: 0.5 mg/dL (ref 0.2–1.2)

## 2014-11-29 LAB — LIPID PANEL
Cholesterol: 133 mg/dL (ref 0–200)
HDL: 20 mg/dL — ABNORMAL LOW (ref >=46)
LDL Cholesterol: 90 mg/dL (ref 0–99)
Total CHOL/HDL Ratio: 6.7 Ratio
Triglycerides: 115 mg/dL (ref <150)
VLDL: 23 mg/dL (ref 0–40)

## 2014-11-29 LAB — HEMOGLOBIN A1C
Hgb A1c MFr Bld: 7.2 % — ABNORMAL HIGH (ref <5.7)
MEAN PLASMA GLUCOSE: 160 mg/dL — AB (ref <117)

## 2014-11-29 NOTE — Assessment & Plan Note (Signed)
Recommended that she stay on her Lasix daily. Lab work pending. If creatinine starts to climb, may need to change Lasix to every other day to avoid dehydration

## 2014-11-29 NOTE — Assessment & Plan Note (Signed)
Lab work pending from today. Recommended that she stay on her pravastatin. Goal LDL less than 70 given her diabetes

## 2014-11-29 NOTE — Assessment & Plan Note (Addendum)
Blood pressure is low today. She has not even taken her morning medications. Suggested she consider decreasing the lisinopril back to 20 mg daily given she is having systolic pressure in the 90s at home. Recommended she talk with Dr. Tanya NonesPickard.

## 2014-11-29 NOTE — Patient Instructions (Signed)
You are doing well. No medication changes were made.  If blood pressure runs low, cut the lisinopril in 1/2  Take extra metoprolol as needed if heart rate runs fast  Please call us if you have new issues that need to be addressed before your next appt.  Your physician wants you to follow-up in: 12 months.  You will receive a reminder letter in the mail two months in advance. If you don't receive a letter, please call our office to schedule the follow-up appointment.

## 2014-11-29 NOTE — Progress Notes (Signed)
   Subjective:    Patient ID: Linda Briggs, female    DOB: 09/26/1955, 59 y.o.   MRN: [id number]  HPI Patient also reports significant postmenopausal vaginal bleeding. She is reporting occasional passage of clots. She is having to wear menstrual pads at times due to the vaginal bleeding. This is been going on for the last several months.   Review of Systems     Objective:   Physical Exam        Assessment & Plan:  I have asked the patient to see a gynecologist for an endometrial biopsy as well as a sonohysterogram to rule out intrauterine masses.  I will schedule the patient see a gynecologist as soon as possible

## 2014-11-29 NOTE — Addendum Note (Signed)
Addended by: Lynnea FerrierPICKARD, Anwita Mencer on: 11/29/2014 03:45 PM   Modules accepted: Orders

## 2014-11-29 NOTE — Assessment & Plan Note (Signed)
We have encouraged continued exercise, careful diet management in an effort to lose weight. 

## 2014-11-29 NOTE — Assessment & Plan Note (Signed)
Chronic Baseline mild shortness of breath likely from obesity, deconditioning. Recommended if symptoms change with worsening shortness of breath or chest tightness with exertion, stress testing could be ordered.

## 2014-11-29 NOTE — Progress Notes (Signed)
Subjective:    Patient ID: Linda Briggs, female    DOB: 02/10/1956, 59 y.o.   MRN: [id number]  HPI  Patient is here today for follow-up of her diabetes. She states that her fasting blood sugars are typically under 130. She is occasionally having hypoglycemic episodes. Her evening blood sugars are typically less than 200. She continues to have severe neuropathy in both feet. She is also having some numbness in her hands. Unfortunately her blood pressure is becoming too low at times. At home her blood pressure has been less than 90 on several occasions systolic. She feels dizzy and weak. At her last office visit I increased her lisinopril due to microalbuminuria. However the patient seems to have symptomatic hypotension at times. Furthermore she cannot stop the metoprolol as this significantly benefits her and preventing palpitations and PVCs. Patient had fasting lab work done this morning however the results of yet to return. Past Medical History  Diagnosis Date  . Diabetes mellitus   . History of hepatitis B   . Neuropathy     feet  . Bulging disc   . Arthritis     feet & toes  . Hyperlipidemia   . Hypertension    Past Surgical History  Procedure Laterality Date  . Tonsillectomy     Current Outpatient Prescriptions on File Prior to Visit  Medication Sig Dispense Refill  . ACCU-CHEK SOFTCLIX LANCETS lancets Use to monitor FSBS 3x daily for fluctuating FSBS. DX: 250.00 100 each 12  . Alcohol Swabs (B-D SINGLE USE SWABS REGULAR) PADS Use to monitor FSBS 3x daily for fluctuating FSBS. DX: 250.00 300 each 1  . aspirin 325 MG tablet Take 325 mg by mouth daily.    . Blood Glucose Calibration (ACCU-CHEK AVIVA) SOLN Use to monitor FSBS 3x daily for fluctuating FSBS. DX: 250.00 3 each 3  . Blood Glucose Monitoring Suppl (ACCU-CHEK AVIVA PLUS) W/DEVICE KIT Use to monitor FSBS 3x daily for fluctuating FSBS. DX: 250.00 1 kit 0  . Cyanocobalamin (VITAMIN B 12 PO) Take 500 mcg by mouth daily.      . furosemide (LASIX) 20 MG tablet Take 1 tablet (20 mg total) by mouth daily. 90 tablet 1  . gabapentin (NEURONTIN) 300 MG capsule Take 1 capsule (300 mg total) by mouth 3 (three) times daily. 270 capsule 1  . glipiZIDE (GLUCOTROL XL) 10 MG 24 hr tablet Take 1 tablet (10 mg total) by mouth daily with breakfast. 90 tablet 3  . glucose blood (ACCU-CHEK AVIVA) test strip Use to monitor FSBS 3x daily for fluctuating FSBS. DX: 250.00 100 each 12  . insulin NPH-regular Human (NOVOLIN 70/30) (70-30) 100 UNIT/ML injection Inject 70 units into the skin in the morning, then 60 units into the skin at bedtime 10 vial 1  . lisinopril (PRINIVIL,ZESTRIL) 40 MG tablet Take 1 tablet (40 mg total) by mouth daily. 90 tablet 1  . metFORMIN (GLUCOPHAGE) 1000 MG tablet Take 1 tablet (1,000 mg total) by mouth 2 (two) times daily with a meal. 180 tablet 1  . metoprolol tartrate (LOPRESSOR) 25 MG tablet Take 1 tablet (25 mg total) by mouth 2 (two) times daily. 180 tablet 1  . Multiple Vitamin (MULTIVITAMIN) tablet Take 1 tablet by mouth daily.    . ondansetron (ZOFRAN) 4 MG tablet TAKE 1 TABLET EVERY 6 HOURS AS NEEDED FOR NAUSEA AND VOMITING 60 tablet 0  . Phenyleph-Doxylamine-DM-APAP (ALKA SELTZER PLUS PO) Take 1 tablet by mouth daily as needed (stomach pain).    Marland Kitchen  potassium chloride (K-DUR) 10 MEQ tablet Take 1 tablet (10 mEq total) by mouth daily. 90 tablet 1  . pravastatin (PRAVACHOL) 40 MG tablet Take 1 tablet (40 mg total) by mouth daily. 90 tablet 3  . traMADol (ULTRAM) 50 MG tablet TAKE 1 TABLET EVERY 6 HOURS AS NEEDED 60 tablet 2  . VITAMIN D, CHOLECALCIFEROL, PO Take 1 tablet by mouth daily.     No current facility-administered medications on file prior to visit.   Allergies  Allergen Reactions  . Codeine Anaphylaxis and Hives  . Penicillins Anaphylaxis and Hives  . Food Nausea And Vomiting    Olives   . Januvia [Sitagliptin]     Stomach cramps  . Latex Hives   History   Social History  . Marital  Status: Single    Spouse Name: N/A  . Number of Children: N/A  . Years of Education: N/A   Occupational History  . Not on file.   Social History Main Topics  . Smoking status: Never Smoker   . Smokeless tobacco: Never Used  . Alcohol Use: 0.0 oz/week     Comment: social drinker.  . Drug Use: No  . Sexual Activity: Not on file   Other Topics Concern  . Not on file   Social History Narrative     Review of Systems  All other systems reviewed and are negative.      Objective:   Physical Exam  Constitutional: She appears well-developed and well-nourished.  Neck: Neck supple. No JVD present.  Cardiovascular: Normal rate, regular rhythm, normal heart sounds and intact distal pulses.   No murmur heard. Pulmonary/Chest: Effort normal and breath sounds normal. No respiratory distress. She has no wheezes. She has no rales.  Abdominal: Soft. Bowel sounds are normal. She exhibits no distension. There is no tenderness. There is no rebound and no guarding.  Musculoskeletal: She exhibits edema.  Vitals reviewed.         Assessment & Plan:  Diabetes mellitus type II, uncontrolled  HLD (hyperlipidemia)  Essential hypertension  Patient's blood pressure is too low at times. I have asked the patient to decrease her lisinopril to 20 mg a day. Due to the hypoglycemia and her significant insulin requirement, I have asked the patient to discontinue glipizide is I feel this medication is likely ineffective. I will replace with farxiga 10 mg poqday to also see if I can facilitate some weight loss in this patient which may help her back pain. I have encouraged the patient to try to exercise much as possible even if his using a recumbent bike. Immunizations are up-to-date. I will await the results for fasting lipid panel. Goal LDL cholesterol is less than 100.

## 2014-11-29 NOTE — Assessment & Plan Note (Signed)
Abdominal pain has dramatically improved since her last clinic visit. She takes nausea medications as needed

## 2014-11-29 NOTE — Progress Notes (Signed)
Patient ID: Linda Briggs, female    DOB: 1955/09/25, 58 y.o.   MRN: [id number]  HPI Comments: Linda Briggs is a very pleasant 59 year old woman with diabetes, morbid obesity, hyperlipidemia, hypertension, chronic lower extremity swelling, patient of Dr. Dennard Schaumann, with previous symptoms of dizziness.  She is on insulin. She presents for routine followup of her hypertension and leg swelling  hemoglobin A1c down to 6.8. Then up to 7, 7.2 Previously was 8.1  She reports her abdominal symptoms have improved. She takes nausea medications for any abdominal upset Some baseline chronic shortness of breath with exertion. Legs are weak, walks with a cane. No regular exercise program Denies any new changes in her shortness of breath, no new chest pain symptoms She does report having rare episodes of orthostasis. Measuring systolic pressures in the mid 90s at times Rare palpitations typically after she has ambulated or exerted herself. These resolve with rest Since her last clinic visit, lisinopril was increased from 20 up to 40 mg daily. Since she has increased the dose, worsening orthostasis  She reports having a carotid ultrasound showing minimal disease. Thyroid nodules that have been evaluated She takes Lasix daily. Leg edema has been stable  EKG shows normal sinus rhythm with rate 70 bpm, no significant ST or T-wave changes She reports having lab work done this morning. Results not available  previous total cholesterol 180, LDL 111 In the past, she was not taking pravastatin on a regular basis  She wore  Holter monitor in the past that showed elevated baseline heart rate with frequent periods of sinus tachycardia. Improvement in dizzy episodes and fluttering with metoprolol. She has neuropathy in her feet.  Allergies  Allergen Reactions  . Codeine Anaphylaxis and Hives  . Penicillins Anaphylaxis and Hives  . Food Nausea And Vomiting    Olives   . Januvia [Sitagliptin]     Stomach cramps  .  Latex Hives    Outpatient Encounter Prescriptions as of 11/29/2014  Medication Sig  . ACCU-CHEK SOFTCLIX LANCETS lancets Use to monitor FSBS 3x daily for fluctuating FSBS. DX: 250.00  . Alcohol Swabs (B-D SINGLE USE SWABS REGULAR) PADS Use to monitor FSBS 3x daily for fluctuating FSBS. DX: 250.00  . aspirin 325 MG tablet Take 325 mg by mouth daily.  . Blood Glucose Calibration (ACCU-CHEK AVIVA) SOLN Use to monitor FSBS 3x daily for fluctuating FSBS. DX: 250.00  . Blood Glucose Monitoring Suppl (ACCU-CHEK AVIVA PLUS) W/DEVICE KIT Use to monitor FSBS 3x daily for fluctuating FSBS. DX: 250.00  . Cyanocobalamin (VITAMIN B 12 PO) Take 500 mcg by mouth daily.  . furosemide (LASIX) 20 MG tablet Take 1 tablet (20 mg total) by mouth daily.  Marland Kitchen gabapentin (NEURONTIN) 300 MG capsule Take 1 capsule (300 mg total) by mouth 3 (three) times daily.  Marland Kitchen glipiZIDE (GLUCOTROL XL) 10 MG 24 hr tablet Take 1 tablet (10 mg total) by mouth daily with breakfast.  . glucose blood (ACCU-CHEK AVIVA) test strip Use to monitor FSBS 3x daily for fluctuating FSBS. DX: 250.00  . insulin NPH-regular Human (NOVOLIN 70/30) (70-30) 100 UNIT/ML injection Inject 70 units into the skin in the morning, then 60 units into the skin at bedtime  . lisinopril (PRINIVIL,ZESTRIL) 40 MG tablet Take 1 tablet (40 mg total) by mouth daily.  . metFORMIN (GLUCOPHAGE) 1000 MG tablet Take 1 tablet (1,000 mg total) by mouth 2 (two) times daily with a meal.  . metoprolol tartrate (LOPRESSOR) 25 MG tablet Take 1 tablet (25 mg  total) by mouth 2 (two) times daily.  . Multiple Vitamin (MULTIVITAMIN) tablet Take 1 tablet by mouth daily.  . ondansetron (ZOFRAN) 4 MG tablet TAKE 1 TABLET EVERY 6 HOURS AS NEEDED FOR NAUSEA AND VOMITING  . Phenyleph-Doxylamine-DM-APAP (ALKA SELTZER PLUS PO) Take 1 tablet by mouth daily as needed (stomach pain).  . potassium chloride (K-DUR) 10 MEQ tablet Take 1 tablet (10 mEq total) by mouth daily.  . pravastatin (PRAVACHOL) 40  MG tablet Take 1 tablet (40 mg total) by mouth daily.  . traMADol (ULTRAM) 50 MG tablet TAKE 1 TABLET EVERY 6 HOURS AS NEEDED  . VITAMIN D, CHOLECALCIFEROL, PO Take 1 tablet by mouth daily.    Past Medical History  Diagnosis Date  . Diabetes mellitus   . History of hepatitis B   . Neuropathy     feet  . Bulging disc   . Arthritis     feet & toes  . Hyperlipidemia   . Hypertension     Past Surgical History  Procedure Laterality Date  . Tonsillectomy      Social History  reports that she has never smoked. She has never used smokeless tobacco. She reports that she drinks alcohol. She reports that she does not use illicit drugs.  Family History family history includes Heart disease in her brother and father; Hyperlipidemia in her father and mother; Hypertension in her father; Stroke in her brother.   Review of Systems  Constitutional: Negative.   Respiratory: Negative.   Cardiovascular: Positive for leg swelling.  Gastrointestinal: Negative.   Musculoskeletal: Negative.   Skin: Negative.   Neurological: Negative.   Hematological: Negative.   Psychiatric/Behavioral: Negative.   All other systems reviewed and are negative.   BP 110/64 mmHg  Pulse 70  Ht 5' 6"  (1.676 m)  Wt 320 lb 8 oz (145.378 kg)  BMI 51.75 kg/m2  Physical Exam  Constitutional: She is oriented to person, place, and time. She appears well-developed and well-nourished.  HENT:  Head: Normocephalic.  Nose: Nose normal.  Mouth/Throat: Oropharynx is clear and moist.  Eyes: Conjunctivae are normal. Pupils are equal, round, and reactive to light.  Neck: Normal range of motion. Neck supple. No JVD present.  Cardiovascular: Normal rate, regular rhythm, S1 normal, S2 normal, normal heart sounds and intact distal pulses.  Exam reveals no gallop and no friction rub.   No murmur heard. Woody edema bilaterally below the knees, no significant pitting  Pulmonary/Chest: Effort normal and breath sounds normal. No  respiratory distress. She has no wheezes. She has no rales. She exhibits no tenderness.  Abdominal: Soft. Bowel sounds are normal. She exhibits no distension. There is no tenderness.  Musculoskeletal: Normal range of motion. She exhibits no edema or tenderness.  Lymphadenopathy:    She has no cervical adenopathy.  Neurological: She is alert and oriented to person, place, and time. Coordination normal.  Skin: Skin is warm and dry. No rash noted. No erythema.  Psychiatric: She has a normal mood and affect. Her behavior is normal. Judgment and thought content normal.    Assessment and Plan  Nursing note and vitals reviewed.

## 2014-12-13 ENCOUNTER — Telehealth: Payer: Self-pay | Admitting: *Deleted

## 2014-12-13 NOTE — Telephone Encounter (Signed)
Submitted humana referral thru [facility] for authorization on 12/09/14 to Dr. Mitchel HonourMegan Morris, MD gyncologist with authorization [id number]  Requesting provider: Priscille HeidelbergWarren T. Pickard,MD  Treating provider: Mitchel HonourMegan Morris, Md  Number of visits:6  Start Date: 40/21/16  End Date: 06/20/15  Dx: N95.0-postmenopausel bleeding  Copy has been faxed to Dr. Langston MaskerMorris office for records

## 2014-12-22 DIAGNOSIS — N950 Postmenopausal bleeding: Secondary | ICD-10-CM | POA: Diagnosis not present

## 2015-01-02 ENCOUNTER — Other Ambulatory Visit: Payer: Self-pay | Admitting: Obstetrics & Gynecology

## 2015-01-02 ENCOUNTER — Ambulatory Visit: Payer: Commercial Managed Care - HMO | Admitting: Family Medicine

## 2015-01-02 DIAGNOSIS — N950 Postmenopausal bleeding: Secondary | ICD-10-CM | POA: Diagnosis not present

## 2015-01-02 DIAGNOSIS — N840 Polyp of corpus uteri: Secondary | ICD-10-CM | POA: Diagnosis not present

## 2015-01-18 DIAGNOSIS — N950 Postmenopausal bleeding: Secondary | ICD-10-CM | POA: Diagnosis not present

## 2015-02-08 NOTE — Patient Instructions (Signed)
Your procedure is scheduled on:  Wednesday, February 15, 2015  [facility]  [facility]00 A.M.  Pick up the phone at the desk and dial [phone].  Call this number if you have problems the morning of surgery: [phone].  Remember: Do NOT eat food or drink after:  Midnight Tuesday, February 14, 2015 Take these medicines the morning of surgery with a SIP OF WATER:  Gabapentin, Potassium, Lisinopril, Metoprolol, Pravastatin  DO NOT TAKE METFORMIN 24 HOURS PRIOR TO DAY OF SURGERY!  ONLY TAKE 1/2 (HALF) DOSE OF YOUR BEDTIME INSULIN!!  Do NOT wear jewelry (body piercing), metal hair clips/bobby pins, make-up, or nail polish. Do NOT wear lotions, powders, or perfumes.  You may wear deoderant. Do NOT shave for 48 hours prior to surgery. Do NOT bring valuables to the hospital. Contacts, dentures, or bridgework may not be worn into surgery.  Have a responsible adult drive you home and stay with you for 24 hours after your procedure

## 2015-02-09 ENCOUNTER — Encounter (HOSPITAL_COMMUNITY)
Admission: RE | Admit: 2015-02-09 | Discharge: 2015-02-09 | Disposition: A | Discharge status: Home / Self Care | Payer: Commercial Managed Care - HMO | Source: Ambulatory Visit | Attending: Obstetrics & Gynecology | Admitting: Obstetrics & Gynecology

## 2015-02-09 ENCOUNTER — Encounter (HOSPITAL_COMMUNITY): Payer: Self-pay

## 2015-02-09 DIAGNOSIS — Z01818 Encounter for other preprocedural examination: Secondary | ICD-10-CM | POA: Diagnosis not present

## 2015-02-09 HISTORY — DX: Reserved for inherently not codable concepts without codable children: IMO0001

## 2015-02-09 HISTORY — DX: Sleep apnea, unspecified: G47.30

## 2015-02-09 HISTORY — DX: Other specified cardiac arrhythmias: I49.8

## 2015-02-09 HISTORY — DX: Peripheral vascular disease, unspecified: I73.9

## 2015-02-09 HISTORY — DX: Myoneural disorder, unspecified: G70.9

## 2015-02-09 LAB — BASIC METABOLIC PANEL
ANION GAP: 8 (ref 5–15)
BUN: 13 mg/dL (ref 6–20)
CO2: 26 mmol/L (ref 22–32)
Calcium: 9 mg/dL (ref 8.9–10.3)
Chloride: 104 mmol/L (ref 101–111)
Creatinine, Ser: 0.79 mg/dL (ref 0.44–1.00)
GFR calc non Af Amer: >60 mL/min (ref >60)
GLUCOSE: 155 mg/dL — AB (ref 65–99)
Potassium: 4.5 mmol/L (ref 3.5–5.1)
SODIUM: 138 mmol/L (ref 135–145)

## 2015-02-09 LAB — CBC
HEMATOCRIT: 37.6 % (ref 36.0–46.0)
HEMOGLOBIN: 12.9 g/dL (ref 12.0–15.0)
MCH: 32.2 pg (ref 26.0–34.0)
MCHC: 34.3 g/dL (ref 30.0–36.0)
MCV: 93.8 fL (ref 78.0–100.0)
PLATELETS: 176 10*3/uL (ref 150–400)
RBC: 4.01 MIL/uL (ref 3.87–5.11)
RDW: 14.3 % (ref 11.5–15.5)
WBC: 10.8 10*3/uL — AB (ref 4.0–10.5)

## 2015-02-09 LAB — TYPE AND SCREEN
ABO/RH(D): O POS
Antibody Screen: NEGATIVE

## 2015-02-09 LAB — ABO/RH: ABO/RH(D): O POS

## 2015-02-09 NOTE — Anesthesia Preprocedure Evaluation (Addendum)
Anesthesia Evaluation  Patient identified by MRN, date of birth, ID band Patient awake    Reviewed: Allergy & Precautions, NPO status , Patient's Chart, lab work & pertinent test results, reviewed documented beta blocker date and time   Airway Mallampati: II   Neck ROM: Full    Dental  (+) Teeth Intact, Dental Advisory Given   Pulmonary shortness of breath, sleep apnea ,  breath sounds clear to auscultation        Cardiovascular hypertension, Pt. on medications Rhythm:Regular  ECHO 1013 EF 60%   Neuro/Psych Carotids <39% stenosis    GI/Hepatic negative GI ROS, Neg liver ROS,   Endo/Other  diabetes, Type 2, Insulin DependentMorbid obesity  Renal/GU      Musculoskeletal   Abdominal (+) + obese,   Peds  Hematology   Anesthesia Other Findings   Reproductive/Obstetrics                           Anesthesia Physical Anesthesia Plan  ASA: III  Anesthesia Plan: General   Post-op Pain Management:    Induction: Intravenous  Airway Management Planned: Oral ETT  Additional Equipment:   Intra-op Plan:   Post-operative Plan: Extubation in OR  Informed Consent: I have reviewed the patients History and Physical, chart, labs and discussed the procedure including the risks, benefits and alternatives for the proposed anesthesia with the patient or authorized representative who has indicated his/her understanding and acceptance.     Plan Discussed with:   Anesthesia Plan Comments:         Anesthesia Quick Evaluation

## 2015-02-11 NOTE — H&P (Signed)
Linda Briggs is an 59 y.o. female with postmenopausal bleeding.  Patient has significant past medical hx for obesity (BMI >50), DM, HTN, sleep apnea, heart disease, arthritis and Hep B.  Pertinent Gynecological History: Menses: flow is light Bleeding: post menopausal bleeding Contraception: post menopausal status DES exposure: unknown Blood transfusions: none Sexually transmitted diseases: no past history Previous GYN Procedures: none  Last mammogram: none Date: n/a Last pap: normal Date: 5 years ago OB History: G0   Menstrual History: Menarche age: n/a No LMP recorded. Patient is postmenopausal.    Past Medical History  Diagnosis Date  . Diabetes mellitus   . History of hepatitis B   . Neuropathy     feet  . Bulging disc   . Arthritis     feet & toes  . Hyperlipidemia   . Hypertension   . Shortness of breath dyspnea     on lopressor   . 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 Peripheral vascular disease   . Sleep apnea     pt had study done but it was incomplete due to blood sugar issues   . Neuromuscular disorder     in feet and fingers     Past Surgical History  Procedure Laterality Date  . Tonsillectomy    . Tyroid biospy      january 2016    Family History  Problem Relation Age of Onset  . Hyperlipidemia Mother   . Heart disease Father   . Hyperlipidemia Father   . Hypertension Father   . Heart disease Brother   . Stroke Brother     Social History:  reports that she has never smoked. She has never used smokeless tobacco. She reports that she drinks alcohol. She reports that she does not use illicit drugs.  Allergies:  Allergies  Allergen Reactions  . Codeine Anaphylaxis and Hives  . Penicillins Anaphylaxis and Hives  . Food Nausea And Vomiting    Olives   . Januvia [Sitagliptin]     Stomach cramps  . Latex Hives    No prescriptions prior to admission    ROS  There were no vitals taken for this visit. Physical Exam   Constitutional: She is oriented to person, place, and time. She appears well-developed and well-nourished.  GI: Soft. There is no rebound and no guarding.  Neurological: She is alert and oriented to person, place, and time.  Skin: Skin is warm and dry.  Psychiatric: She has a normal mood and affect. Her behavior is normal.    No results found for this or any previous visit (from the past 24 hour(s)).  No results found.  Assessment/Plan: 58yo G0 with obesity and PMB -H/S, D&C -Patient is counseled re: risk of bleeding, infection, scarring and damage to surrounding structures requiring additional procedures.  All questions were answered and the patient wishes to proceed.  Lenin Kuhnle 02/11/2015, 7:19 AM

## 2015-02-15 ENCOUNTER — Encounter (HOSPITAL_COMMUNITY)
Admission: RE | Disposition: A | Discharge status: Home / Self Care | Payer: Self-pay | Source: Ambulatory Visit | Attending: Obstetrics & Gynecology

## 2015-02-15 ENCOUNTER — Ambulatory Visit (HOSPITAL_COMMUNITY)
Admission: RE | Admit: 2015-02-15 | Discharge: 2015-02-15 | Disposition: A | Discharge status: Home / Self Care | Payer: Commercial Managed Care - HMO | Source: Ambulatory Visit | Attending: Obstetrics & Gynecology | Admitting: Obstetrics & Gynecology

## 2015-02-15 ENCOUNTER — Ambulatory Visit (HOSPITAL_COMMUNITY): Payer: Commercial Managed Care - HMO | Admitting: Anesthesiology

## 2015-02-15 DIAGNOSIS — E785 Hyperlipidemia, unspecified: Secondary | ICD-10-CM | POA: Diagnosis not present

## 2015-02-15 DIAGNOSIS — N950 Postmenopausal bleeding: Secondary | ICD-10-CM | POA: Insufficient documentation

## 2015-02-15 DIAGNOSIS — Z880 Allergy status to penicillin: Secondary | ICD-10-CM | POA: Insufficient documentation

## 2015-02-15 DIAGNOSIS — G709 Myoneural disorder, unspecified: Secondary | ICD-10-CM | POA: Insufficient documentation

## 2015-02-15 DIAGNOSIS — Z888 Allergy status to other drugs, medicaments and biological substances status: Secondary | ICD-10-CM | POA: Insufficient documentation

## 2015-02-15 DIAGNOSIS — M19071 Primary osteoarthritis, right ankle and foot: Secondary | ICD-10-CM | POA: Insufficient documentation

## 2015-02-15 DIAGNOSIS — Z885 Allergy status to narcotic agent status: Secondary | ICD-10-CM | POA: Diagnosis not present

## 2015-02-15 DIAGNOSIS — Z8619 Personal history of other infectious and parasitic diseases: Secondary | ICD-10-CM | POA: Insufficient documentation

## 2015-02-15 DIAGNOSIS — Z6841 Body Mass Index (BMI) 40.0 and over, adult: Secondary | ICD-10-CM | POA: Diagnosis not present

## 2015-02-15 DIAGNOSIS — M19072 Primary osteoarthritis, left ankle and foot: Secondary | ICD-10-CM | POA: Insufficient documentation

## 2015-02-15 DIAGNOSIS — N840 Polyp of corpus uteri: Secondary | ICD-10-CM | POA: Insufficient documentation

## 2015-02-15 DIAGNOSIS — I10 Essential (primary) hypertension: Secondary | ICD-10-CM | POA: Diagnosis not present

## 2015-02-15 DIAGNOSIS — E119 Type 2 diabetes mellitus without complications: Secondary | ICD-10-CM | POA: Diagnosis not present

## 2015-02-15 DIAGNOSIS — I739 Peripheral vascular disease, unspecified: Secondary | ICD-10-CM | POA: Diagnosis not present

## 2015-02-15 DIAGNOSIS — G4730 Sleep apnea, unspecified: Secondary | ICD-10-CM | POA: Insufficient documentation

## 2015-02-15 DIAGNOSIS — Z91040 Latex allergy status: Secondary | ICD-10-CM | POA: Diagnosis not present

## 2015-02-15 HISTORY — PX: HYSTEROSCOPY WITH D & C: SHX1775

## 2015-02-15 LAB — GLUCOSE, CAPILLARY
Glucose-Capillary: 165 mg/dL — ABNORMAL HIGH (ref 65–99)
Glucose-Capillary: 174 mg/dL — ABNORMAL HIGH (ref 65–99)

## 2015-02-15 LAB — PREGNANCY, URINE: PREG TEST UR: NEGATIVE

## 2015-02-15 SURGERY — DILATATION AND CURETTAGE /HYSTEROSCOPY
Anesthesia: General | Site: Vagina

## 2015-02-15 MED ORDER — ALBUTEROL SULFATE HFA 108 (90 BASE) MCG/ACT IN AERS
INHALATION_SPRAY | RESPIRATORY_TRACT | Status: DC | PRN
  Administered 2015-02-15: 2 via RESPIRATORY_TRACT

## 2015-02-15 MED ORDER — FENTANYL CITRATE (PF) 100 MCG/2ML IJ SOLN
INTRAMUSCULAR | Status: AC
  Filled 2015-02-15: qty 2

## 2015-02-15 MED ORDER — PROMETHAZINE HCL 25 MG/ML IJ SOLN: 6.2500 mg | INTRAMUSCULAR | Status: DC | PRN

## 2015-02-15 MED ORDER — GLYCINE 1.5 % IR SOLN
Status: DC | PRN
  Administered 2015-02-15: 3000 mL

## 2015-02-15 MED ORDER — LIDOCAINE HCL (CARDIAC) 20 MG/ML IV SOLN
INTRAVENOUS | Status: AC
  Filled 2015-02-15: qty 5

## 2015-02-15 MED ORDER — FENTANYL CITRATE (PF) 100 MCG/2ML IJ SOLN
INTRAMUSCULAR | Status: AC
  Administered 2015-02-15: 25 ug via INTRAVENOUS
  Filled 2015-02-15: qty 2

## 2015-02-15 MED ORDER — MIDAZOLAM HCL 2 MG/2ML IJ SOLN
INTRAMUSCULAR | Status: DC | PRN
  Administered 2015-02-15: 2 mg via INTRAVENOUS

## 2015-02-15 MED ORDER — PROPOFOL 10 MG/ML IV BOLUS
INTRAVENOUS | Status: DC | PRN
  Administered 2015-02-15: 250 mg via INTRAVENOUS

## 2015-02-15 MED ORDER — FENTANYL CITRATE (PF) 100 MCG/2ML IJ SOLN
25.0000 ug | INTRAMUSCULAR | Status: DC | PRN
  Administered 2015-02-15: 25 ug via INTRAVENOUS

## 2015-02-15 MED ORDER — LACTATED RINGERS IV SOLN
INTRAVENOUS | Status: DC
  Administered 2015-02-15 (×2): via INTRAVENOUS

## 2015-02-15 MED ORDER — FENTANYL CITRATE (PF) 100 MCG/2ML IJ SOLN
INTRAMUSCULAR | Status: DC | PRN
Start: 2015-02-15 — End: 2015-02-15
  Administered 2015-02-15: 100 ug via INTRAVENOUS

## 2015-02-15 MED ORDER — DEXAMETHASONE SODIUM PHOSPHATE 4 MG/ML IJ SOLN
INTRAMUSCULAR | Status: AC
Start: 2015-02-15 — End: 2015-02-15
  Filled 2015-02-15: qty 1

## 2015-02-15 MED ORDER — SUCCINYLCHOLINE CHLORIDE 20 MG/ML IJ SOLN
INTRAMUSCULAR | Status: DC | PRN
  Administered 2015-02-15: 140 mg via INTRAVENOUS

## 2015-02-15 MED ORDER — LIDOCAINE HCL (CARDIAC) 20 MG/ML IV SOLN
INTRAVENOUS | Status: DC | PRN
  Administered 2015-02-15: 100 mg via INTRAVENOUS

## 2015-02-15 MED ORDER — DEXTROSE IN LACTATED RINGERS 5 % IV SOLN: INTRAVENOUS | Status: DC

## 2015-02-15 MED ORDER — ONDANSETRON HCL 4 MG/2ML IJ SOLN
INTRAMUSCULAR | Status: AC
  Filled 2015-02-15: qty 2

## 2015-02-15 MED ORDER — MIDAZOLAM HCL 2 MG/2ML IJ SOLN
INTRAMUSCULAR | Status: AC
Start: 2015-02-15 — End: 2015-02-15
  Filled 2015-02-15: qty 2

## 2015-02-15 MED ORDER — PROPOFOL 10 MG/ML IV BOLUS
INTRAVENOUS | Status: AC
  Filled 2015-02-15: qty 20

## 2015-02-15 MED ORDER — ONDANSETRON HCL 4 MG/2ML IJ SOLN
INTRAMUSCULAR | Status: DC | PRN
  Administered 2015-02-15: 4 mg via INTRAVENOUS

## 2015-02-15 MED ORDER — MEPERIDINE HCL 25 MG/ML IJ SOLN: 6.2500 mg | INTRAMUSCULAR | Status: DC | PRN

## 2015-02-15 MED ORDER — IBUPROFEN 800 MG PO TABS: 800.0000 mg | ORAL_TABLET | Freq: Three times a day (TID) | ORAL | Status: DC | PRN

## 2015-02-15 MED ORDER — EPHEDRINE SULFATE 50 MG/ML IJ SOLN
INTRAMUSCULAR | Status: DC | PRN
  Administered 2015-02-15: 10 mg via INTRAVENOUS

## 2015-02-15 MED ORDER — ALBUTEROL SULFATE HFA 108 (90 BASE) MCG/ACT IN AERS
INHALATION_SPRAY | RESPIRATORY_TRACT | Status: AC
  Filled 2015-02-15: qty 6.7

## 2015-02-15 SURGICAL SUPPLY — 19 items
ABLATOR ENDOMETRIAL BIPOLAR (ABLATOR) IMPLANT
CANISTER SUCT 3000ML (MISCELLANEOUS) ×3 IMPLANT
CATH ROBINSON RED A/P 16FR (CATHETERS) ×3 IMPLANT
CATH THERMACHOICE III (CATHETERS) IMPLANT
CLOTH BEACON ORANGE TIMEOUT ST (SAFETY) ×3 IMPLANT
CONTAINER PREFILL 10% NBF 60ML (FORM) ×6 IMPLANT
ELECT REM PT RETURN 9FT ADLT (ELECTROSURGICAL) ×3
ELECTRODE REM PT RTRN 9FT ADLT (ELECTROSURGICAL) ×1 IMPLANT
GLOVE BIO SURGEON STRL SZ 6 (GLOVE) ×3 IMPLANT
GLOVE BIOGEL PI IND STRL 6 (GLOVE) ×2 IMPLANT
GLOVE BIOGEL PI INDICATOR 6 (GLOVE) ×4
GOWN STRL REUS W/TWL LRG LVL3 (GOWN DISPOSABLE) ×6 IMPLANT
LOOP ANGLED CUTTING 22FR (CUTTING LOOP) IMPLANT
PACK VAGINAL MINOR WOMEN LF (CUSTOM PROCEDURE TRAY) ×3 IMPLANT
PAD OB MATERNITY 4.3X12.25 (PERSONAL CARE ITEMS) ×3 IMPLANT
TOWEL OR 17X24 6PK STRL BLUE (TOWEL DISPOSABLE) ×6 IMPLANT
TUBING AQUILEX INFLOW (TUBING) ×3 IMPLANT
TUBING AQUILEX OUTFLOW (TUBING) ×3 IMPLANT
WATER STERILE IRR 1000ML POUR (IV SOLUTION) ×3 IMPLANT

## 2015-02-15 NOTE — OR Nursing (Signed)
cbg of [id number] reported to dr Berneice Heinrich. Pt assymptomatic. Pt instructed to take pm insulin when she gets home if she can tolerate food. Margarita Mail rn

## 2015-02-15 NOTE — Anesthesia Postprocedure Evaluation (Signed)
  Anesthesia Post-op Note  Patient: Linda Briggs  Procedure(s) Performed: Procedure(s): DILATATION AND CURETTAGE /HYSTEROSCOPY (N/A)  Patient Location: PACU  Anesthesia Type:General  Level of Consciousness: awake and alert   Airway and Oxygen Therapy: Patient Spontanous Breathing and Patient connected to face mask oxygen  Post-op Pain: mild  Post-op Assessment: Post-op Vital signs reviewed, Patient's Cardiovascular Status Stable, Respiratory Function Stable, Patent Airway and No signs of Nausea or vomiting              Post-op Vital Signs: Reviewed and stable  Last Vitals:  Filed Vitals:   02/15/15 0938  BP:   Pulse: 69  Temp:   Resp: 22    Complications: No apparent anesthesia complications

## 2015-02-15 NOTE — Discharge Instructions (Signed)
Call MD for T>100.4, heavy vaginal bleeding, severe abdominal pain, intractable nausea and/or vomiting, or respiratory distress.  Call office to schedule postop appointment in 2 weeks.  Pelvic rest x 4 weeks.DISCHARGE INSTRUCTIONS: D&C / D&E The following instructions have been prepared to help you care for yourself upon your return home.   Personal hygiene:  Use sanitary pads for vaginal drainage, not tampons.  Shower the day after your procedure.  NO tub baths, pools or Jacuzzis for 2-3 weeks.  Wipe front to back after using the bathroom.  Activity and limitations:  Do NOT drive or operate any equipment for 24 hours. The effects of anesthesia are still present and drowsiness may result.  Do NOT rest in bed all day.  Walking is encouraged.  Walk up and down stairs slowly.  You may resume your normal activity in one to two days or as indicated by your physician.  Sexual activity: NO intercourse for at least 2 weeks after the procedure, or as indicated by your physician.  Diet: Eat a light meal as desired this evening. You may resume your usual diet tomorrow.  Return to work: You may resume your work activities in one to two days or as indicated by your doctor.  What to expect after your surgery: Expect to have vaginal bleeding/discharge for 2-3 days and spotting for up to 10 days. It is not unusual to have soreness for up to 1-2 weeks. You may have a slight burning sensation when you urinate for the first day. Mild cramps may continue for a couple of days. You may have a regular period in 2-6 weeks.  Call your doctor for any of the following:  Excessive vaginal bleeding, saturating and changing one pad every hour.  Inability to urinate 6 hours after discharge from hospital.  Pain not relieved by pain medication.  Fever of 100.4 F or greater.  Unusual vaginal discharge or odor.   Call for an appointment:    Patients signature: ______________________  Nurses  signature ________________________  Support person's signature_______________________

## 2015-02-15 NOTE — Transfer of Care (Signed)
Immediate Anesthesia Transfer of Care Note  Patient: Linda Briggs  Procedure(s) Performed: Procedure(s): DILATATION AND CURETTAGE /HYSTEROSCOPY (N/A)  Patient Location: PACU  Anesthesia Type:General  Level of Consciousness: awake, alert  and oriented  Airway & Oxygen Therapy: Patient Spontanous Breathing and Patient connected to nasal cannula oxygen  Post-op Assessment: Report given to RN, Post -op Vital signs reviewed and stable and Patient moving all extremities  Post vital signs: Reviewed and stable  Last Vitals:  Filed Vitals:   02/15/15 0706  BP: 146/69  Pulse: 96  Temp: 37.3 C  Resp: 20    Complications: No apparent anesthesia complications

## 2015-02-15 NOTE — Anesthesia Procedure Notes (Signed)
Procedure Name: Intubation Date/Time: 02/15/2015 8:42 AM Performed by: Elgie Congo Pre-anesthesia Checklist: Patient identified, Emergency Drugs available, Suction available and Patient being monitored Patient Re-evaluated:Patient Re-evaluated prior to inductionOxygen Delivery Method: Circle system utilized Preoxygenation: Pre-oxygenation with 100% oxygen Intubation Type: IV induction Ventilation: Mask ventilation without difficulty Laryngoscope Size: Mac and 3 Grade View: Grade I Tube type: Oral Number of attempts: 1 Airway Equipment and Method: Stylet Placement Confirmation: ETT inserted through vocal cords under direct vision,  positive ETCO2 and breath sounds checked- equal and bilateral Secured at: 20 cm Tube secured with: Tape Dental Injury: Teeth and Oropharynx as per pre-operative assessment

## 2015-02-15 NOTE — Progress Notes (Signed)
No change to H&P.  Linda Reitan, DO 

## 2015-02-16 ENCOUNTER — Encounter (HOSPITAL_COMMUNITY): Payer: Self-pay | Admitting: Obstetrics & Gynecology

## 2015-02-16 NOTE — Op Note (Signed)
PREOPERATIVE DIAGNOSIS:  59 y.o. with postmenopausal bleeding  POSTOPERATIVE DIAGNOSIS: The same  PROCEDURE: Hysteroscopy, Dilation and Curettage.  SURGEON:  Dr. Mitchel Honour  INDICATIONS: 59 y.o. G0, obese white female  here for scheduled surgery for hysteroscopy, D&C.   Risks of surgery were discussed with the patient including but not limited to: bleeding which may require transfusion; infection which may require antibiotics; injury to uterus or surrounding organs; intrauterine scarring which may impair future fertility; need for additional procedures including laparotomy or laparoscopy; and other postoperative/anesthesia complications. Written informed consent was obtained.    FINDINGS:  A 8 week size uterus.  Diffuse thickened endometrium with a few calcifications noted.  ANESTHESIA:   General  ESTIMATED BLOOD LOSS:  Less than 20 ml  SPECIMENS: Endometrial curettings sent to pathology  COMPLICATIONS:  None immediate.  PROCEDURE DETAILS:  The patient was taken to the operating room where general anesthesia was administered and was found to be adequate.  After an adequate timeout was performed, she was placed in the dorsal lithotomy position and examined; then prepped and draped in the sterile manner.   Her bladder was catheterized for an unmeasured amount of clear, yellow urine. A speculum was then placed in the patient's vagina and a single tooth tenaculum was applied to the anterior lip of the cervix.  The uteus was dilated manually with metal dilators to accommodate the 5.5 mm diagnostic hysteroscope.  Once the cervix was dilated, the hysteroscope was inserted under direct visualization using saline as a suspension medium.  The uterine cavity was carefully examined with the above dictated findings.  After further careful visualization of the uterine cavity, the hysteroscope was removed under direct visualization.  A sharp curettage was then performed to obtain a moderate amount of  endometrial curettings.  The tenaculum was removed from the anterior lip of the cervix and the vaginal speculum was removed after noting good hemostasis.  The patient tolerated the procedure well and was taken to the recovery area awake, extubated and in stable condition.

## 2015-02-20 ENCOUNTER — Ambulatory Visit: Payer: Commercial Managed Care - HMO | Admitting: Family Medicine

## 2015-02-23 ENCOUNTER — Ambulatory Visit (INDEPENDENT_AMBULATORY_CARE_PROVIDER_SITE_OTHER): Payer: Commercial Managed Care - HMO | Admitting: Family Medicine

## 2015-02-23 ENCOUNTER — Encounter: Payer: Self-pay | Admitting: Family Medicine

## 2015-02-23 VITALS — BP 126/74 | HR 78 | Temp 98.8°F | Resp 18 | Ht 66.0 in | Wt 321.0 lb

## 2015-02-23 DIAGNOSIS — Z1231 Encounter for screening mammogram for malignant neoplasm of breast: Secondary | ICD-10-CM

## 2015-02-23 DIAGNOSIS — E119 Type 2 diabetes mellitus without complications: Secondary | ICD-10-CM | POA: Diagnosis not present

## 2015-02-23 DIAGNOSIS — Z1211 Encounter for screening for malignant neoplasm of colon: Secondary | ICD-10-CM | POA: Diagnosis not present

## 2015-02-23 DIAGNOSIS — Z0000 Encounter for general adult medical examination without abnormal findings: Secondary | ICD-10-CM | POA: Diagnosis not present

## 2015-02-23 LAB — COMPLETE METABOLIC PANEL WITH GFR
ALBUMIN: 3.7 g/dL (ref 3.5–5.2)
ALK PHOS: 66 U/L (ref 39–117)
ALT: 15 U/L (ref 0–35)
AST: 14 U/L (ref 0–37)
BUN: 17 mg/dL (ref 6–23)
CALCIUM: 8.9 mg/dL (ref 8.4–10.5)
CHLORIDE: 107 meq/L (ref 96–112)
CO2: 24 meq/L (ref 19–32)
Creat: 0.74 mg/dL (ref 0.50–1.10)
Glucose, Bld: 110 mg/dL — ABNORMAL HIGH (ref 70–99)
Potassium: 4.1 mEq/L (ref 3.5–5.3)
Sodium: 139 mEq/L (ref 135–145)
TOTAL PROTEIN: 6.6 g/dL (ref 6.0–8.3)
Total Bilirubin: 0.6 mg/dL (ref 0.2–1.2)

## 2015-02-23 LAB — LIPID PANEL
CHOL/HDL RATIO: 7.5 ratio
Cholesterol: 164 mg/dL (ref 0–200)
HDL: 22 mg/dL — AB (ref >=46)
LDL CALC: 108 mg/dL — AB (ref 0–99)
Triglycerides: 169 mg/dL — ABNORMAL HIGH (ref <150)
VLDL: 34 mg/dL (ref 0–40)

## 2015-02-23 NOTE — Progress Notes (Signed)
 Subjective:    Patient ID: Linda Briggs, female    DOB: 07/31/1956, 59 y.o.   MRN: [id number]  HPI Patient is here today for complete physical exam. Since her recent dilation and curettage for bleeding, the patient's blood sugar has been much lower. Therefore she has reduced her insulin to 70 units in the morning and 40 units in the evening. Overall she is doing well. She is due for mammogram. She is due for colonoscopy. Pneumovax 23 is up-to-date. Diabetic foot exam is performed today. Past Medical History  Diagnosis Date  . Diabetes mellitus   . History of hepatitis B   . Neuropathy     feet  . Bulging disc   . Arthritis     feet & toes  . Hyperlipidemia   . Hypertension   . Shortness of breath dyspnea     on lopressor   . 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 Peripheral vascular disease   . Sleep apnea     pt had study done but it was incomplete due to blood sugar issues   . Neuromuscular disorder     in feet and fingers    Past Surgical History  Procedure Laterality Date  . Tonsillectomy    . Tyroid biospy      january 2016  . Hysteroscopy w/d&c N/A 02/15/2015    Procedure: DILATATION AND CURETTAGE /HYSTEROSCOPY;  Surgeon: Megan Morris, DO;  Location: [facility];  Service: Gynecology;  Laterality: N/A;   Current Outpatient Prescriptions on File Prior to Visit  Medication Sig Dispense Refill  . ACCU-CHEK SOFTCLIX LANCETS lancets Use to monitor FSBS 3x daily for fluctuating FSBS. DX: 250.00 100 each 12  . Alcohol Swabs (B-D SINGLE USE SWABS REGULAR) PADS Use to monitor FSBS 3x daily for fluctuating FSBS. DX: 250.00 300 each 1  . aspirin 325 MG tablet Take 325 mg by mouth daily.    . Bilberry 1000 MG CAPS Take 1 capsule by mouth daily.    . Blood Glucose Calibration (ACCU-CHEK AVIVA) SOLN Use to monitor FSBS 3x daily for fluctuating FSBS. DX: 250.00 3 each 3  . Blood Glucose Monitoring Suppl (ACCU-CHEK AVIVA PLUS) W/DEVICE KIT Use to monitor FSBS 3x daily for  fluctuating FSBS. DX: 250.00 1 kit 0  . calcium carbonate (TUMS - DOSED IN MG ELEMENTAL CALCIUM) 500 MG chewable tablet Chew 1 tablet by mouth as needed for indigestion or heartburn.    . Carboxymethylcellulose Sodium (LUBRICANT EYE DROPS OP) Apply 1 drop to eye as needed (dry eyes or allergies).     . CHROMIUM-CINNAMON PO Take 1 capsule by mouth daily.    . Cyanocobalamin (VITAMIN B 12 PO) Take 1,000 mcg by mouth daily.     . furosemide (LASIX) 20 MG tablet Take 1 tablet (20 mg total) by mouth daily. 90 tablet 1  . gabapentin (NEURONTIN) 300 MG capsule Take 1 capsule (300 mg total) by mouth 3 (three) times daily. 270 capsule 1  . glipiZIDE (GLUCOTROL XL) 10 MG 24 hr tablet Take 1 tablet (10 mg total) by mouth daily with breakfast. 90 tablet 3  . glucose blood (ACCU-CHEK AVIVA) test strip Use to monitor FSBS 3x daily for fluctuating FSBS. DX: 250.00 100 each 12  . ibuprofen (ADVIL,MOTRIN) 800 MG tablet Take 1 tablet (800 mg total) by mouth every 8 (eight) hours as needed. 30 tablet 0  . insulin NPH-regular Human (NOVOLIN 70/30) (70-30) 100 UNIT/ML injection Inject 70 units into the   skin in the morning, then 60 units into the skin at bedtime (Patient taking differently: Inject 70 Units into the skin 2 (two) times daily. Inject 70 units into the skin in the morning, then 70 units into the skin at bedtime) 10 vial 1  . lisinopril (PRINIVIL,ZESTRIL) 40 MG tablet Take 1 tablet (40 mg total) by mouth daily. 90 tablet 1  . metFORMIN (GLUCOPHAGE) 1000 MG tablet Take 1 tablet (1,000 mg total) by mouth 2 (two) times daily with a meal. 180 tablet 1  . metoprolol tartrate (LOPRESSOR) 25 MG tablet Take 1 tablet (25 mg total) by mouth 2 (two) times daily. 180 tablet 1  . ondansetron (ZOFRAN) 4 MG tablet TAKE 1 TABLET EVERY 6 HOURS AS NEEDED FOR NAUSEA AND VOMITING 60 tablet 0  . Phenyleph-Doxylamine-DM-APAP (ALKA SELTZER PLUS PO) Take 1 tablet by mouth daily as needed (stomach pain).    . potassium chloride  (K-DUR) 10 MEQ tablet Take 1 tablet (10 mEq total) by mouth daily. 90 tablet 1  . pravastatin (PRAVACHOL) 40 MG tablet Take 1 tablet (40 mg total) by mouth daily. 90 tablet 3  . SPIRULINA PO Take 1 capsule by mouth daily.    . traMADol (ULTRAM) 50 MG tablet TAKE 1 TABLET EVERY 6 HOURS AS NEEDED (Patient taking differently: TAKE 1 TABLET EVERY 6 HOURS AS NEEDED for back pain) 60 tablet 2  . VITAMIN D, CHOLECALCIFEROL, PO Take 1,000 Units by mouth daily.     . vitamin E 400 UNIT capsule Take 400 Units by mouth daily.     No current facility-administered medications on file prior to visit.   Allergies  Allergen Reactions  . Codeine Anaphylaxis and Hives  . Penicillins Anaphylaxis and Hives  . Food Nausea And Vomiting    Olives   . Januvia [Sitagliptin]     Stomach cramps  . Latex Hives   History   Social History  . Marital Status: Single    Spouse Name: N/A  . Number of Children: N/A  . Years of Education: N/A   Occupational History  . Not on file.   Social History Main Topics  . Smoking status: Never Smoker   . Smokeless tobacco: Never Used  . Alcohol Use: 0.0 oz/week     Comment: social drinker.  . Drug Use: No  . Sexual Activity: Not on file   Other Topics Concern  . Not on file   Social History Narrative   Family History  Problem Relation Age of Onset  . Hyperlipidemia Mother   . Heart disease Father   . Hyperlipidemia Father   . Hypertension Father   . Heart disease Brother   . Stroke Brother       Review of Systems  All other systems reviewed and are negative.      Objective:   Physical Exam  Constitutional: She is oriented to person, place, and time. She appears well-developed and well-nourished. No distress.  HENT:  Head: Normocephalic and atraumatic.  Right Ear: External ear normal.  Left Ear: External ear normal.  Nose: Nose normal.  Mouth/Throat: Oropharynx is clear and moist. No oropharyngeal exudate.  Eyes: Conjunctivae and EOM are  normal. Pupils are equal, round, and reactive to light. Right eye exhibits no discharge. Left eye exhibits no discharge. No scleral icterus.  Neck: Normal range of motion. Neck supple. No JVD present. No tracheal deviation present. No thyromegaly present.  Cardiovascular: Normal rate, regular rhythm, normal heart sounds and intact distal pulses.  Exam reveals   no gallop and no friction rub.   No murmur heard. Pulmonary/Chest: Effort normal and breath sounds normal. No stridor. No respiratory distress. She has no wheezes. She has no rales. She exhibits no tenderness.  Abdominal: Soft. Bowel sounds are normal. She exhibits no distension and no mass. There is no tenderness. There is no rebound and no guarding.  Musculoskeletal: Normal range of motion. She exhibits no edema or tenderness.  Lymphadenopathy:    She has no cervical adenopathy.  Neurological: She is alert and oriented to person, place, and time. She has normal reflexes. She displays normal reflexes. No cranial nerve deficit. She exhibits normal muscle tone. Coordination normal.  Skin: Skin is warm. No rash noted. She is not diaphoretic. No erythema. No pallor.  Psychiatric: She has a normal mood and affect. Her behavior is normal. Judgment and thought content normal.  Vitals reviewed.         Assessment & Plan:  Diabetes mellitus type II, controlled - Plan: CBC with Differential/Platelet, COMPLETE METABOLIC PANEL WITH GFR, Hemoglobin A1c, Lipid panel  Routine general medical examination at a health care facility  Screening mammogram for high-risk patient - Plan: MM Digital Screening  Colon cancer screening - Plan: Ambulatory referral to Gastroenterology  Patient's blood pressure is excellent. I will check a CBC, CMP, fasting lipid panel, and hemoglobin A1c. Goal LDL cholesterol is less than 100. Goal hemoglobin A1c is less than 7. Diabetic foot exam is performed today. I will schedule the patient for mammogram. I will schedule the  patient for colonoscopy. We also recommended diet exercise and weight loss. Also encourage the patient to consider bariatric surgery for morbid obesity. 

## 2015-02-24 ENCOUNTER — Encounter: Payer: Self-pay | Admitting: Gastroenterology

## 2015-02-24 LAB — CBC WITH DIFFERENTIAL/PLATELET
BASOS ABS: 0.1 10*3/uL (ref 0.0–0.1)
Basophils Relative: 1 % (ref 0–1)
EOS ABS: 0.2 10*3/uL (ref 0.0–0.7)
Eosinophils Relative: 2 % (ref 0–5)
HEMATOCRIT: 38 % (ref 36.0–46.0)
Hemoglobin: 12.7 g/dL (ref 12.0–15.0)
Lymphocytes Relative: 26 % (ref 12–46)
Lymphs Abs: 2.4 10*3/uL (ref 0.7–4.0)
MCH: 31.1 pg (ref 26.0–34.0)
MCHC: 33.4 g/dL (ref 30.0–36.0)
MCV: 93.1 fL (ref 78.0–100.0)
MONOS PCT: 4 % (ref 3–12)
MPV: 9.8 fL (ref 8.6–12.4)
Monocytes Absolute: 0.4 10*3/uL (ref 0.1–1.0)
Neutro Abs: 6.3 10*3/uL (ref 1.7–7.7)
Neutrophils Relative %: 67 % (ref 43–77)
Platelets: 164 10*3/uL (ref 150–400)
RBC: 4.08 MIL/uL (ref 3.87–5.11)
RDW: 14.7 % (ref 11.5–15.5)
WBC: 9.4 10*3/uL (ref 4.0–10.5)

## 2015-02-24 LAB — HEMOGLOBIN A1C
Hgb A1c MFr Bld: 6.6 % — ABNORMAL HIGH (ref <5.7)
Mean Plasma Glucose: 143 mg/dL — ABNORMAL HIGH (ref <117)

## 2015-02-27 ENCOUNTER — Ambulatory Visit: Payer: Commercial Managed Care - HMO | Admitting: Family Medicine

## 2015-03-01 ENCOUNTER — Encounter: Payer: Self-pay | Admitting: *Deleted

## 2015-03-03 ENCOUNTER — Telehealth: Payer: Self-pay | Admitting: *Deleted

## 2015-03-03 NOTE — Telephone Encounter (Signed)
Submitted [facility] referral thru [facility] for authorization to Dr. Desiree Lucyachelle Davis, OD with authorization number [id number]  Requesting provider: Priscille HeidelbergWarren T. 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 Earlene Plateravis

## 2015-03-09 DIAGNOSIS — E119 Type 2 diabetes mellitus without complications: Secondary | ICD-10-CM | POA: Diagnosis not present

## 2015-03-09 DIAGNOSIS — H2513 Age-related nuclear cataract, bilateral: Secondary | ICD-10-CM | POA: Diagnosis not present

## 2015-03-09 DIAGNOSIS — H40013 Open angle with borderline findings, low risk, bilateral: Secondary | ICD-10-CM | POA: Diagnosis not present

## 2015-03-09 DIAGNOSIS — H3563 Retinal hemorrhage, bilateral: Secondary | ICD-10-CM | POA: Diagnosis not present

## 2015-03-21 ENCOUNTER — Encounter: Payer: Self-pay | Admitting: Family Medicine

## 2015-03-29 ENCOUNTER — Ambulatory Visit: Payer: Commercial Managed Care - HMO

## 2015-04-17 ENCOUNTER — Telehealth: Payer: Self-pay | Admitting: *Deleted

## 2015-04-17 DIAGNOSIS — Z1211 Encounter for screening for malignant neoplasm of colon: Secondary | ICD-10-CM

## 2015-04-17 NOTE — Telephone Encounter (Signed)
Patty, please schedule direct screening colonoscopy at hospital for BMI over 50. Patient is for pre-visit on Wed. 04/19/15. Thanks, Mishka Stegemann.

## 2015-04-17 NOTE — Telephone Encounter (Signed)
Patient coming in for pre-visit for direct screening colonoscopy. Last BMI 51.9 on 02-23-15. Should patient have office visit before colon or direct hospital? Thanks, Shiah Berhow PV

## 2015-04-17 NOTE — Telephone Encounter (Signed)
Ok for direct hospital case.  Please work with Alexia Freestone on the date, next available EUS Thursday with MAC.  Thanks

## 2015-04-18 ENCOUNTER — Other Ambulatory Visit: Payer: Self-pay

## 2015-04-18 DIAGNOSIS — Z1211 Encounter for screening for malignant neoplasm of colon: Secondary | ICD-10-CM

## 2015-04-18 NOTE — Telephone Encounter (Signed)
Pt has been scheduled for 04/27/15 730 am she will have previsit on 04/19/15  Hospital orders are in as well as case.

## 2015-04-18 NOTE — Telephone Encounter (Signed)
Linda Briggs- Pt is scheduled for pre-visit 06/09/15 at 11 am RM 50, will need hospital colonoscopy scheduled sometime around that date. Please call pt Thursday 04/20/15 with procedure date and time. She is expecting your call, pt is aware procedure is to be scheduled at [facility]. [place]

## 2015-04-20 NOTE — Telephone Encounter (Signed)
Pt has been rescheduled to 06/22/15 730 am [facility] and previsit is 06/09/15 11 am pt is aware

## 2015-05-03 ENCOUNTER — Encounter: Payer: Commercial Managed Care - HMO | Admitting: Gastroenterology

## 2015-05-29 ENCOUNTER — Ambulatory Visit (INDEPENDENT_AMBULATORY_CARE_PROVIDER_SITE_OTHER): Payer: Commercial Managed Care - HMO | Admitting: Family Medicine

## 2015-05-29 ENCOUNTER — Encounter: Payer: Self-pay | Admitting: Family Medicine

## 2015-05-29 VITALS — BP 130/80 | HR 68 | Temp 98.6°F | Resp 18 | Ht 66.0 in | Wt 314.0 lb

## 2015-05-29 DIAGNOSIS — E785 Hyperlipidemia, unspecified: Secondary | ICD-10-CM

## 2015-05-29 DIAGNOSIS — E1165 Type 2 diabetes mellitus with hyperglycemia: Secondary | ICD-10-CM | POA: Diagnosis not present

## 2015-05-29 DIAGNOSIS — I10 Essential (primary) hypertension: Secondary | ICD-10-CM

## 2015-05-29 DIAGNOSIS — E1140 Type 2 diabetes mellitus with diabetic neuropathy, unspecified: Secondary | ICD-10-CM | POA: Diagnosis not present

## 2015-05-29 DIAGNOSIS — E119 Type 2 diabetes mellitus without complications: Secondary | ICD-10-CM | POA: Diagnosis not present

## 2015-05-29 DIAGNOSIS — Z23 Encounter for immunization: Secondary | ICD-10-CM | POA: Diagnosis not present

## 2015-05-29 DIAGNOSIS — H3500 Unspecified background retinopathy: Secondary | ICD-10-CM

## 2015-05-29 DIAGNOSIS — H3509 Other intraretinal microvascular abnormalities: Secondary | ICD-10-CM | POA: Diagnosis not present

## 2015-05-29 DIAGNOSIS — IMO0002 Reserved for concepts with insufficient information to code with codable children: Secondary | ICD-10-CM

## 2015-05-29 LAB — COMPLETE METABOLIC PANEL WITH GFR
ALT: 11 U/L (ref 6–29)
AST: 12 U/L (ref 10–35)
Albumin: 4 g/dL (ref 3.6–5.1)
Alkaline Phosphatase: 66 U/L (ref 33–130)
BILIRUBIN TOTAL: 0.7 mg/dL (ref 0.2–1.2)
BUN: 14 mg/dL (ref 7–25)
CO2: 24 mmol/L (ref 20–31)
CREATININE: 0.78 mg/dL (ref 0.50–1.05)
Calcium: 9.1 mg/dL (ref 8.6–10.4)
Chloride: 105 mmol/L (ref 98–110)
GFR, Est Non African American: 85 mL/min (ref >=60)
Glucose, Bld: 119 mg/dL — ABNORMAL HIGH (ref 70–99)
Potassium: 4.3 mmol/L (ref 3.5–5.3)
Sodium: 138 mmol/L (ref 135–146)
TOTAL PROTEIN: 6.6 g/dL (ref 6.1–8.1)

## 2015-05-29 LAB — CBC WITH DIFFERENTIAL/PLATELET
Basophils Absolute: 0 10*3/uL (ref 0.0–0.1)
Basophils Relative: 0 % (ref 0–1)
Eosinophils Absolute: 0.1 10*3/uL (ref 0.0–0.7)
Eosinophils Relative: 1 % (ref 0–5)
HEMATOCRIT: 39 % (ref 36.0–46.0)
Hemoglobin: 13.6 g/dL (ref 12.0–15.0)
LYMPHS ABS: 2.7 10*3/uL (ref 0.7–4.0)
LYMPHS PCT: 23 % (ref 12–46)
MCH: 31.8 pg (ref 26.0–34.0)
MCHC: 34.9 g/dL (ref 30.0–36.0)
MCV: 91.1 fL (ref 78.0–100.0)
MPV: 9.6 fL (ref 8.6–12.4)
Monocytes Absolute: 0.5 10*3/uL (ref 0.1–1.0)
Monocytes Relative: 4 % (ref 3–12)
NEUTROS PCT: 72 % (ref 43–77)
Neutro Abs: 8.6 10*3/uL — ABNORMAL HIGH (ref 1.7–7.7)
Platelets: 176 10*3/uL (ref 150–400)
RBC: 4.28 MIL/uL (ref 3.87–5.11)
RDW: 14.3 % (ref 11.5–15.5)
WBC: 11.9 10*3/uL — ABNORMAL HIGH (ref 4.0–10.5)

## 2015-05-29 LAB — LIPID PANEL
Cholesterol: 151 mg/dL (ref 125–200)
HDL: 22 mg/dL — ABNORMAL LOW (ref >=46)
LDL CALC: 105 mg/dL (ref <130)
Total CHOL/HDL Ratio: 6.9 Ratio — ABNORMAL HIGH (ref <=5.0)
Triglycerides: 119 mg/dL (ref <150)
VLDL: 24 mg/dL (ref <30)

## 2015-05-29 NOTE — Progress Notes (Signed)
Subjective:    Patient ID: Linda Briggs, female    DOB: Jan 07, 1956, 59 y.o.   MRN: [id number]  HPI 02/23/15 Patient is here today for complete physical exam. Since her recent dilation and curettage for bleeding, the patient's blood sugar has been much lower. Therefore she has reduced her insulin to 70 units in the morning and 40 units in the evening. Overall she is doing well. She is due for mammogram. She is due for colonoscopy. Pneumovax 23 is up-to-date. Diabetic foot exam is performed today.  At that time, my plan was: Patient's blood pressure is excellent. I will check a CBC, CMP, fasting lipid panel, and hemoglobin A1c. Goal LDL cholesterol is less than 100. Goal hemoglobin A1c is less than 7. Diabetic foot exam is performed today. I will schedule the patient for mammogram. I will schedule the patient for colonoscopy. We also recommended diet exercise and weight loss. Also encourage the patient to consider bariatric surgery for morbid obesity.  05/29/15 HgA1c in June was 6.6 but LDL was 108.  Overall, I was very happy with that.  Patient has worked hard on her diet as well as exercise and has lost 7 pounds since her last office visit. Unfortunately she is seen her sugars start to steadily increased and she is frequently seen sugars are around 200. She attributes this to stress as she is having to care for her mother who has Lewy body dementia. She denies eating more carbohydrates. She denies any recent infections. She denies any chest pain shortness of breath or dyspnea on exertion Past Medical History  Diagnosis Date  . Diabetes mellitus   . History of hepatitis B   . Neuropathy     feet  . Bulging disc   . Arthritis     feet & toes  . Hyperlipidemia   . Hypertension   . Shortness of breath dyspnea     on lopressor   . 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 Peripheral vascular disease   . Sleep apnea     pt had study done but it was incomplete due to blood sugar issues   .  Neuromuscular disorder     in feet and fingers    Past Surgical History  Procedure Laterality Date  . Tonsillectomy    . Tyroid biospy      january 2016  . Hysteroscopy w/d&c N/A 02/15/2015    Procedure: DILATATION AND CURETTAGE /HYSTEROSCOPY;  Surgeon: Linda Hedges, DO;  Location: [facility];  Service: Gynecology;  Laterality: N/A;   Current Outpatient Prescriptions on File Prior to Visit  Medication Sig Dispense Refill  . ACCU-CHEK SOFTCLIX LANCETS lancets Use to monitor FSBS 3x daily for fluctuating FSBS. DX: 250.00 100 each 12  . Alcohol Swabs (B-D SINGLE USE SWABS REGULAR) PADS Use to monitor FSBS 3x daily for fluctuating FSBS. DX: 250.00 300 each 1  . aspirin 325 MG tablet Take 325 mg by mouth daily.    . Bilberry 1000 MG CAPS Take 1 capsule by mouth daily.    . Blood Glucose Calibration (ACCU-CHEK AVIVA) SOLN Use to monitor FSBS 3x daily for fluctuating FSBS. DX: 250.00 3 each 3  . Blood Glucose Monitoring Suppl (ACCU-CHEK AVIVA PLUS) W/DEVICE KIT Use to monitor FSBS 3x daily for fluctuating FSBS. DX: 250.00 1 kit 0  . calcium carbonate (TUMS - DOSED IN MG ELEMENTAL CALCIUM) 500 MG chewable tablet Chew 1 tablet by mouth as needed for indigestion or heartburn.    Marland Kitchen  Carboxymethylcellulose Sodium (LUBRICANT EYE DROPS OP) Apply 1 drop to eye as needed (dry eyes or allergies).     . CHROMIUM-CINNAMON PO Take 1 capsule by mouth daily.    . Cyanocobalamin (VITAMIN B 12 PO) Take 1,000 mcg by mouth daily.     . furosemide (LASIX) 20 MG tablet Take 1 tablet (20 mg total) by mouth daily. 90 tablet 1  . gabapentin (NEURONTIN) 300 MG capsule Take 1 capsule (300 mg total) by mouth 3 (three) times daily. 270 capsule 1  . glipiZIDE (GLUCOTROL XL) 10 MG 24 hr tablet Take 1 tablet (10 mg total) by mouth daily with breakfast. 90 tablet 3  . glucose blood (ACCU-CHEK AVIVA) test strip Use to monitor FSBS 3x daily for fluctuating FSBS. DX: 250.00 100 each 12  . ibuprofen (ADVIL,MOTRIN) 800 MG tablet Take 1  tablet (800 mg total) by mouth every 8 (eight) hours as needed. 30 tablet 0  . insulin NPH-regular Human (NOVOLIN 70/30) (70-30) 100 UNIT/ML injection Inject 70 units into the skin in the morning, then 60 units into the skin at bedtime (Patient taking differently: Inject 70 Units into the skin 2 (two) times daily. Inject 70 units into the skin in the morning, then 40 units into the skin at bedtime) 10 vial 1  . lisinopril (PRINIVIL,ZESTRIL) 40 MG tablet Take 1 tablet (40 mg total) by mouth daily. 90 tablet 1  . metFORMIN (GLUCOPHAGE) 1000 MG tablet Take 1 tablet (1,000 mg total) by mouth 2 (two) times daily with a meal. 180 tablet 1  . metoprolol tartrate (LOPRESSOR) 25 MG tablet Take 1 tablet (25 mg total) by mouth 2 (two) times daily. 180 tablet 1  . ondansetron (ZOFRAN) 4 MG tablet TAKE 1 TABLET EVERY 6 HOURS AS NEEDED FOR NAUSEA AND VOMITING 60 tablet 0  . Phenyleph-Doxylamine-DM-APAP (ALKA SELTZER PLUS PO) Take 1 tablet by mouth daily as needed (stomach pain).    . potassium chloride (K-DUR) 10 MEQ tablet Take 1 tablet (10 mEq total) by mouth daily. 90 tablet 1  . pravastatin (PRAVACHOL) 40 MG tablet Take 1 tablet (40 mg total) by mouth daily. 90 tablet 3  . SPIRULINA PO Take 1 capsule by mouth daily.    . traMADol (ULTRAM) 50 MG tablet TAKE 1 TABLET EVERY 6 HOURS AS NEEDED (Patient taking differently: TAKE 1 TABLET EVERY 6 HOURS AS NEEDED for back pain) 60 tablet 2  . VITAMIN D, CHOLECALCIFEROL, PO Take 1,000 Units by mouth daily.     . vitamin E 400 UNIT capsule Take 400 Units by mouth daily.     No current facility-administered medications on file prior to visit.   Allergies  Allergen Reactions  . Codeine Anaphylaxis and Hives  . Penicillins Anaphylaxis and Hives  . Food Nausea And Vomiting    Olives   . Januvia [Sitagliptin]     Stomach cramps  . Latex Hives   Social History   Social History  . Marital Status: Single    Spouse Name: N/A  . Number of Children: N/A  . Years of  Education: N/A   Occupational History  . Not on file.   Social History Main Topics  . Smoking status: Never Smoker   . Smokeless tobacco: Never Used  . Alcohol Use: 0.0 oz/week     Comment: social drinker.  . Drug Use: No  . Sexual Activity: Not on file   Other Topics Concern  . Not on file   Social History Narrative   Family History  Problem Relation Age of Onset  . Hyperlipidemia Mother   . Heart disease Father   . Hyperlipidemia Father   . Hypertension Father   . Heart disease Brother   . Stroke Brother       Review of Systems  All other systems reviewed and are negative.      Objective:   Physical Exam  Constitutional: She is oriented to person, place, and time. She appears well-developed and well-nourished. No distress.  HENT:  Head: Normocephalic and atraumatic.  Right Ear: External ear normal.  Left Ear: External ear normal.  Nose: Nose normal.  Mouth/Throat: Oropharynx is clear and moist. No oropharyngeal exudate.  Eyes: Conjunctivae and EOM are normal. Pupils are equal, round, and reactive to light. Right eye exhibits no discharge. Left eye exhibits no discharge. No scleral icterus.  Neck: Normal range of motion. Neck supple. No JVD present. No tracheal deviation present. No thyromegaly present.  Cardiovascular: Normal rate, regular rhythm, normal heart sounds and intact distal pulses.  Exam reveals no gallop and no friction rub.   No murmur heard. Pulmonary/Chest: Effort normal and breath sounds normal. No stridor. No respiratory distress. She has no wheezes. She has no rales. She exhibits no tenderness.  Abdominal: Soft. Bowel sounds are normal. She exhibits no distension and no mass. There is no tenderness. There is no rebound and no guarding.  Musculoskeletal: Normal range of motion. She exhibits no edema or tenderness.  Lymphadenopathy:    She has no cervical adenopathy.  Neurological: She is alert and oriented to person, place, and time. She has  normal reflexes. No cranial nerve deficit. She exhibits normal muscle tone. Coordination normal.  Skin: Skin is warm. No rash noted. She is not diaphoretic. No erythema. No pallor.  Psychiatric: She has a normal mood and affect. Her behavior is normal. Judgment and thought content normal.  Vitals reviewed.         Assessment & Plan:  Diabetes mellitus type II, uncontrolled - Plan: CBC with Differential/Platelet, COMPLETE METABOLIC PANEL WITH GFR, Lipid panel, Hemoglobin A1c, Microalbumin, urine  Need for prophylactic vaccination and inoculation against influenza - Plan: Flu Vaccine QUAD 36+ mos IM  Retinopathy, vascular  HLD (hyperlipidemia)  Essential hypertension  DM neuropathy, type II diabetes mellitus  Unfortunately the patient seems to be losing control of her diabetes. I believe this is likely due to the fact the glipizide has lost its effectiveness. Patient is unable to afford SGLT-1, SGLT-2, or GLP inhibitor medications.  Therefore her only option would be to increase her insulin or use Actos as a insulin sensitizer. She has significant edema in her legs. However she would like to try Actos versus increasing her insulin and monitor for worsening edema. Therefore I will check a hemoglobin A1c. Hemoglobin A1c is greater than 8, I would add Actos 30 mg by mouth daily. Her blood pressure is acceptable to I will also check a fasting lipid panel. Goal LDL cholesterol is less than 100. Patient received her flu shot today. Pneumovax 23 is up-to-date

## 2015-05-30 LAB — HEMOGLOBIN A1C
HEMOGLOBIN A1C: 7.5 % — AB (ref <5.7)
Mean Plasma Glucose: 169 mg/dL — ABNORMAL HIGH (ref <117)

## 2015-05-30 LAB — MICROALBUMIN, URINE: Microalb, Ur: 11.8 mg/dL — ABNORMAL HIGH (ref <2.0)

## 2015-05-31 ENCOUNTER — Other Ambulatory Visit: Payer: Self-pay | Admitting: Family Medicine

## 2015-05-31 MED ORDER — PIOGLITAZONE HCL 30 MG PO TABS: 30.0000 mg | ORAL_TABLET | Freq: Every day | ORAL | Status: DC

## 2015-06-04 IMAGING — US US ABDOMEN COMPLETE
2 series · 14 of 25 positions shown · non-contrast
Comparison: None.

CLINICAL DATA: Epigastric pain.

EXAM:
ULTRASOUND ABDOMEN COMPLETE

[Series 1: us abdomen complete · 0.32mm/px · 13 of 69 slices shown (1 of 2)]
[im 1/69]
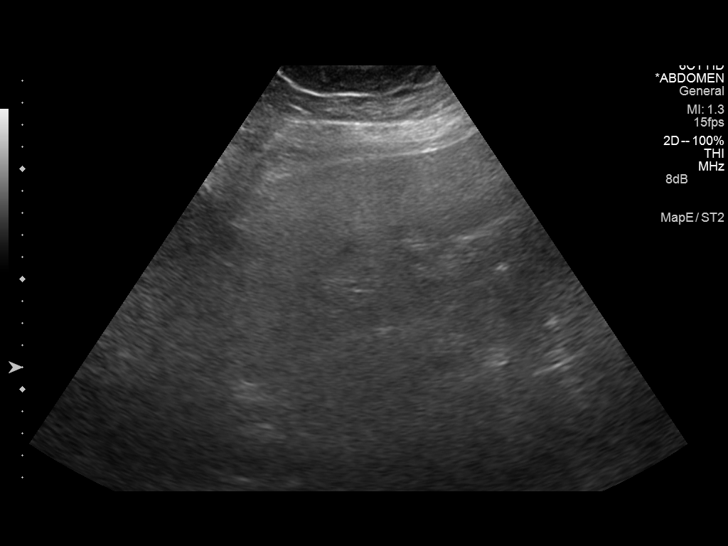
[im 6/69]
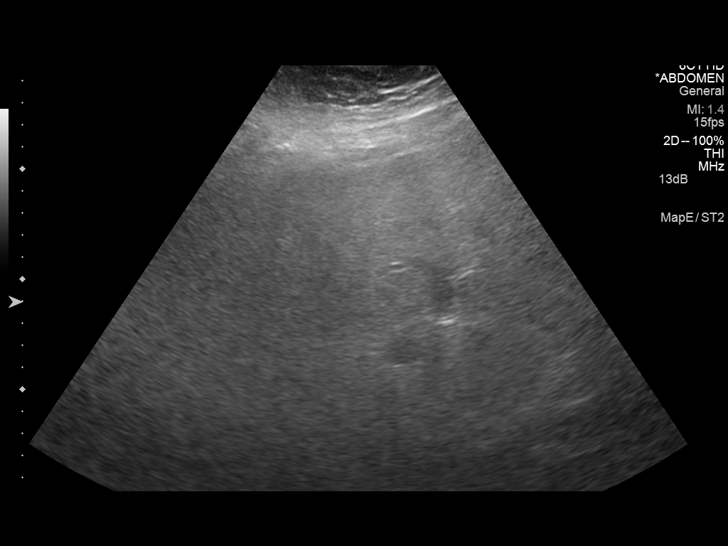
[im 12/69]
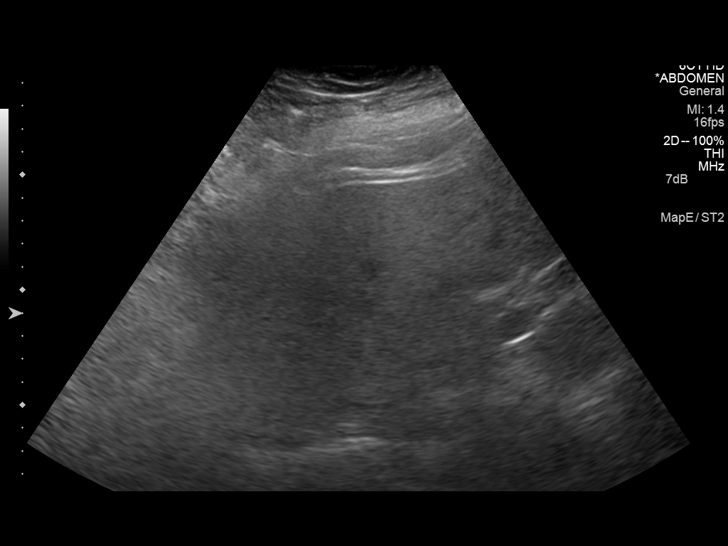
[im 18/69]
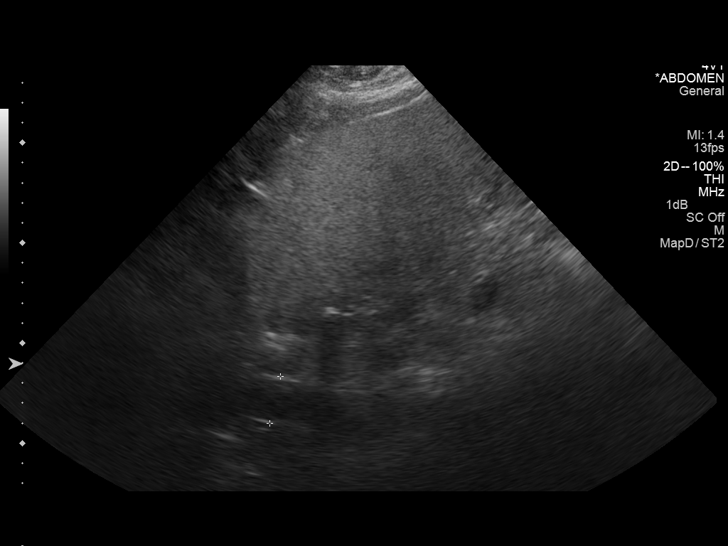
[im 24/69]
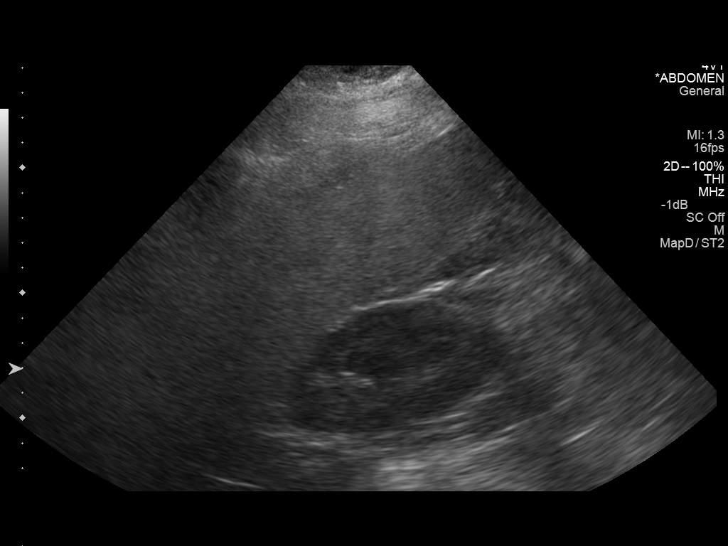
[im 27/69]
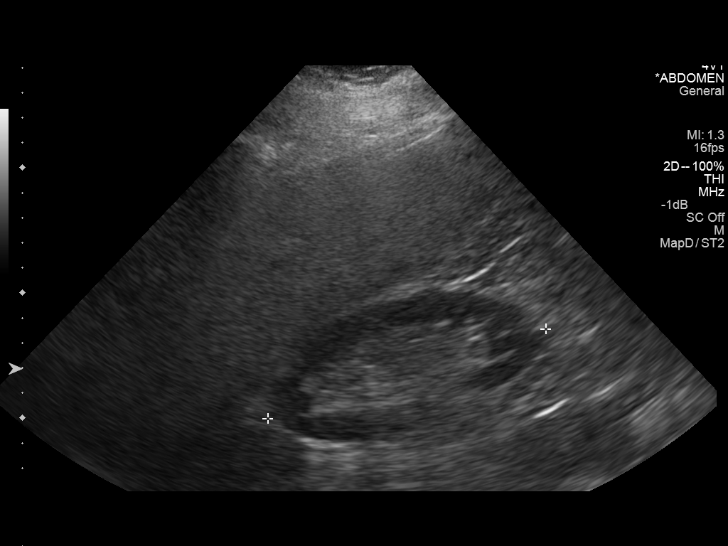
[im 33/69]
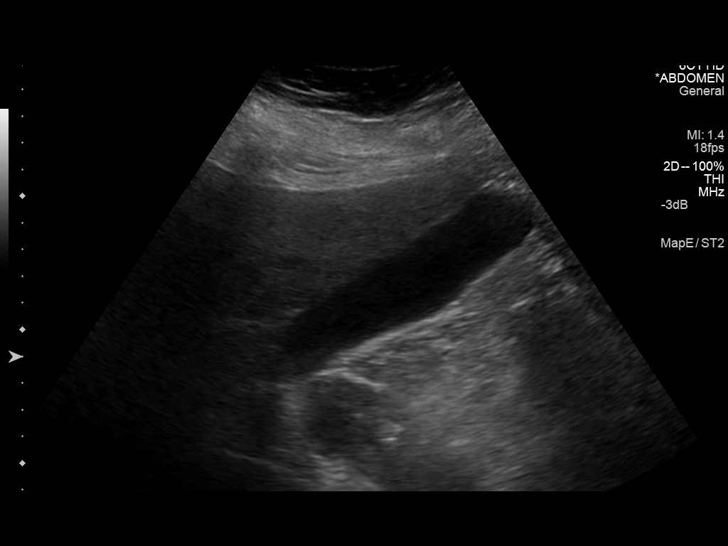
[im 39/69]
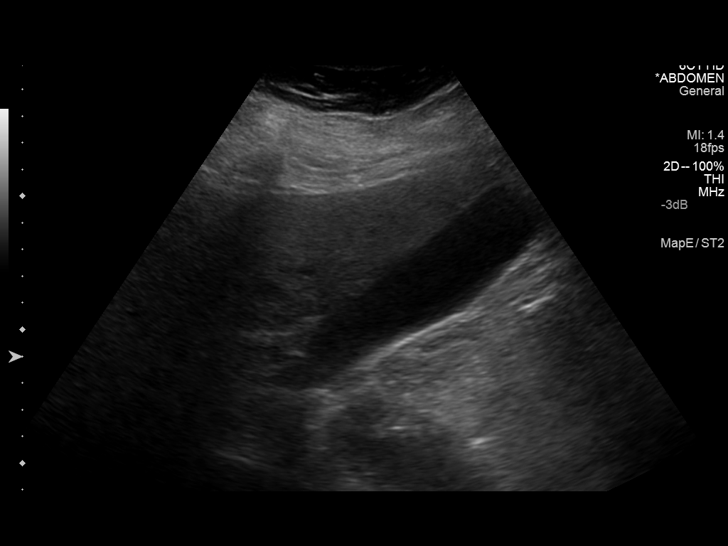
[im 45/69]
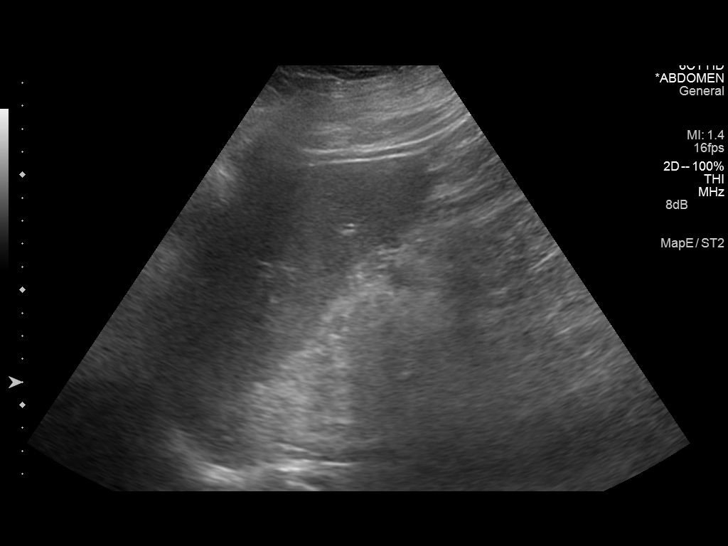
[im 48/69]
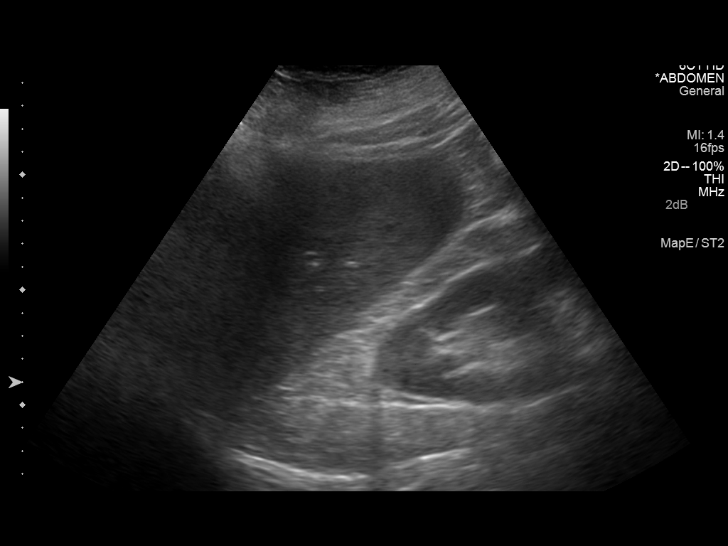
[im 54/69]
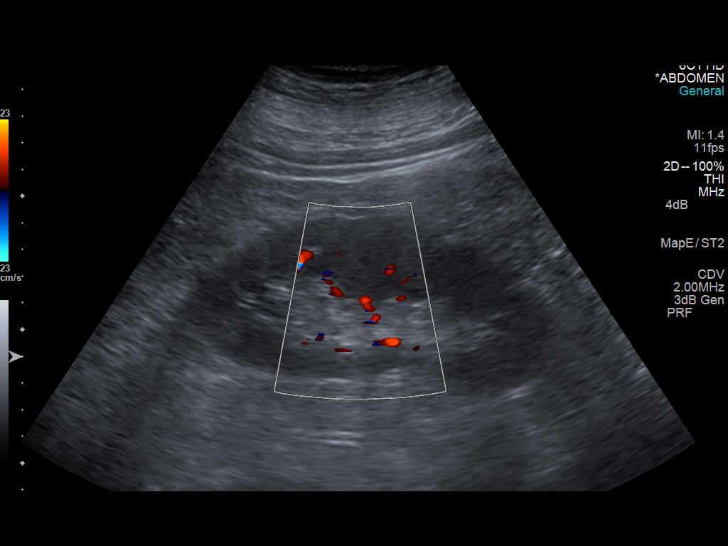
[im 60/69]
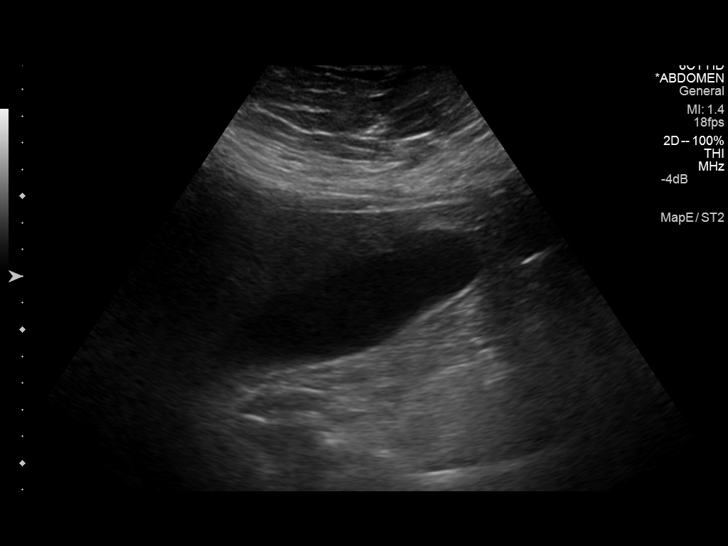
[im 66/69]
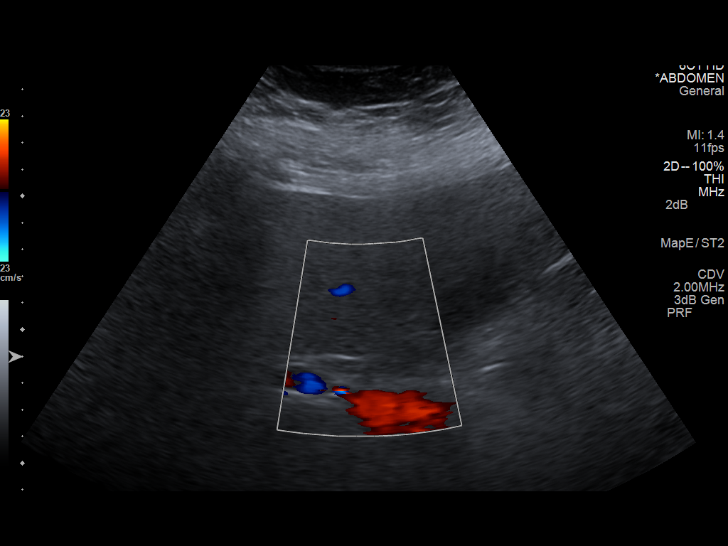

[Series 1: us abdomen complete · 0.32mm/px · 1 of 4 slices shown (2 of 2)]
[im 1/4]
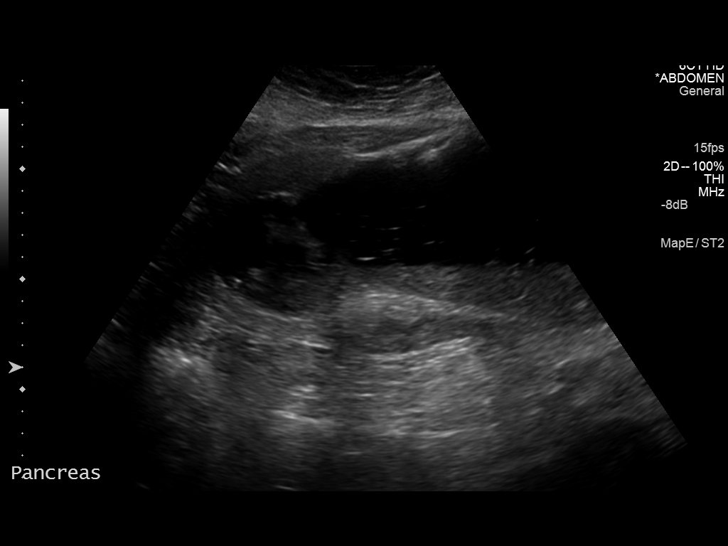

[14 of 25 positions shown; findings below may reference images not displayed]

FINDINGS: Gallbladder:

No gallstones or wall thickening visualized. No sonographic Murphy
sign noted.

Common bile duct:

Diameter: Measures 5.5 mm which is within normal limits.

Liver:

Fatty infiltration of the liver is noted. No focal abnormality is
noted.

IVC:

Not visualized due to overlying bowel gas.

Pancreas:

Not well visualized due to overlying bowel gas.

Spleen:

Size and appearance within normal limits.

Right Kidney:

Length: 12.2 cm. Echogenicity within normal limits. No mass or
hydronephrosis visualized.

Left Kidney:

Length: 15.2 cm. Echogenicity within normal limits. No mass or
hydronephrosis visualized.

Abdominal aorta:

No aneurysm visualized.

Other findings:

None.
IMPRESSION: Fatty infiltration of the liver. No other significant abnormality
seen in the abdomen. Pancreas not well visualized due to overlying
bowel gas.

## 2015-06-07 ENCOUNTER — Telehealth: Payer: Self-pay | Admitting: Gastroenterology

## 2015-06-08 NOTE — Telephone Encounter (Signed)
Pt appt was cx and pt was notified to call back when she is ready to set up another appt.. Pt agreed

## 2015-06-22 ENCOUNTER — Ambulatory Visit (HOSPITAL_COMMUNITY)
Admission: RE | Admit: 2015-06-22 | Payer: Commercial Managed Care - HMO | Source: Ambulatory Visit | Admitting: Gastroenterology

## 2015-06-22 ENCOUNTER — Encounter (HOSPITAL_COMMUNITY): Admission: RE | Payer: Self-pay | Source: Ambulatory Visit

## 2015-06-22 SURGERY — COLONOSCOPY WITH PROPOFOL
Anesthesia: Monitor Anesthesia Care

## 2015-06-23 ENCOUNTER — Encounter: Payer: Commercial Managed Care - HMO | Admitting: Gastroenterology

## 2015-09-06 ENCOUNTER — Other Ambulatory Visit: Payer: Commercial Managed Care - HMO

## 2015-09-06 ENCOUNTER — Other Ambulatory Visit: Payer: Self-pay | Admitting: Family Medicine

## 2015-09-06 DIAGNOSIS — I10 Essential (primary) hypertension: Secondary | ICD-10-CM

## 2015-09-06 DIAGNOSIS — E118 Type 2 diabetes mellitus with unspecified complications: Secondary | ICD-10-CM | POA: Diagnosis not present

## 2015-09-06 DIAGNOSIS — E785 Hyperlipidemia, unspecified: Secondary | ICD-10-CM

## 2015-09-06 DIAGNOSIS — Z79899 Other long term (current) drug therapy: Secondary | ICD-10-CM

## 2015-09-06 DIAGNOSIS — IMO0002 Reserved for concepts with insufficient information to code with codable children: Secondary | ICD-10-CM

## 2015-09-06 DIAGNOSIS — E1165 Type 2 diabetes mellitus with hyperglycemia: Secondary | ICD-10-CM

## 2015-09-06 LAB — COMPLETE METABOLIC PANEL WITH GFR
ALT: 14 U/L (ref 6–29)
AST: 15 U/L (ref 10–35)
Albumin: 4 g/dL (ref 3.6–5.1)
Alkaline Phosphatase: 59 U/L (ref 33–130)
BILIRUBIN TOTAL: 0.7 mg/dL (ref 0.2–1.2)
BUN: 19 mg/dL (ref 7–25)
CO2: 24 mmol/L (ref 20–31)
Calcium: 9.3 mg/dL (ref 8.6–10.4)
Chloride: 104 mmol/L (ref 98–110)
Creat: 0.89 mg/dL (ref 0.50–1.05)
GFR, EST NON AFRICAN AMERICAN: 72 mL/min (ref >=60)
GFR, Est African American: 83 mL/min (ref >=60)
GLUCOSE: 150 mg/dL — AB (ref 70–99)
Potassium: 4.4 mmol/L (ref 3.5–5.3)
Sodium: 139 mmol/L (ref 135–146)
TOTAL PROTEIN: 6.5 g/dL (ref 6.1–8.1)

## 2015-09-06 LAB — CBC WITH DIFFERENTIAL/PLATELET
BASOS ABS: 0 10*3/uL (ref 0.0–0.1)
Basophils Relative: 0 % (ref 0–1)
EOS ABS: 0.1 10*3/uL (ref 0.0–0.7)
EOS PCT: 1 % (ref 0–5)
HEMATOCRIT: 41.2 % (ref 36.0–46.0)
Hemoglobin: 13.6 g/dL (ref 12.0–15.0)
LYMPHS ABS: 2.3 10*3/uL (ref 0.7–4.0)
LYMPHS PCT: 23 % (ref 12–46)
MCH: 30.7 pg (ref 26.0–34.0)
MCHC: 33 g/dL (ref 30.0–36.0)
MCV: 93 fL (ref 78.0–100.0)
MONO ABS: 0.4 10*3/uL (ref 0.1–1.0)
MONOS PCT: 4 % (ref 3–12)
MPV: 10 fL (ref 8.6–12.4)
Neutro Abs: 7.3 10*3/uL (ref 1.7–7.7)
Neutrophils Relative %: 72 % (ref 43–77)
PLATELETS: 180 10*3/uL (ref 150–400)
RBC: 4.43 MIL/uL (ref 3.87–5.11)
RDW: 13.9 % (ref 11.5–15.5)
WBC: 10.1 10*3/uL (ref 4.0–10.5)

## 2015-09-06 LAB — LIPID PANEL
Cholesterol: 153 mg/dL (ref 125–200)
HDL: 25 mg/dL — AB (ref >=46)
LDL CALC: 108 mg/dL (ref <130)
Total CHOL/HDL Ratio: 6.1 Ratio — ABNORMAL HIGH (ref <=5.0)
Triglycerides: 100 mg/dL (ref <150)
VLDL: 20 mg/dL (ref <30)

## 2015-09-07 LAB — HEMOGLOBIN A1C
Hgb A1c MFr Bld: 7.4 % — ABNORMAL HIGH (ref <5.7)
Mean Plasma Glucose: 166 mg/dL — ABNORMAL HIGH (ref <117)

## 2015-09-11 ENCOUNTER — Ambulatory Visit: Payer: Commercial Managed Care - HMO | Admitting: Family Medicine

## 2015-09-18 ENCOUNTER — Ambulatory Visit (INDEPENDENT_AMBULATORY_CARE_PROVIDER_SITE_OTHER): Payer: Commercial Managed Care - HMO | Admitting: Family Medicine

## 2015-09-18 ENCOUNTER — Encounter: Payer: Self-pay | Admitting: Family Medicine

## 2015-09-18 VITALS — BP 142/84 | HR 88 | Temp 98.2°F | Resp 16 | Ht 66.0 in | Wt 315.0 lb

## 2015-09-18 DIAGNOSIS — Z794 Long term (current) use of insulin: Secondary | ICD-10-CM

## 2015-09-18 DIAGNOSIS — E785 Hyperlipidemia, unspecified: Secondary | ICD-10-CM

## 2015-09-18 DIAGNOSIS — E119 Type 2 diabetes mellitus without complications: Secondary | ICD-10-CM | POA: Diagnosis not present

## 2015-09-18 DIAGNOSIS — I10 Essential (primary) hypertension: Secondary | ICD-10-CM

## 2015-09-18 DIAGNOSIS — IMO0001 Reserved for inherently not codable concepts without codable children: Secondary | ICD-10-CM

## 2015-09-18 MED ORDER — INSULIN NPH ISOPHANE & REGULAR (70-30) 100 UNIT/ML ~~LOC~~ SUSP: SUBCUTANEOUS | Status: DC

## 2015-09-18 MED ORDER — GLIPIZIDE ER 10 MG PO TB24: 10.0000 mg | ORAL_TABLET | Freq: Every day | ORAL | Status: DC

## 2015-09-18 MED ORDER — METFORMIN HCL 1000 MG PO TABS: 1000.0000 mg | ORAL_TABLET | Freq: Two times a day (BID) | ORAL | Status: DC

## 2015-09-18 MED ORDER — GABAPENTIN 300 MG PO CAPS: 300.0000 mg | ORAL_CAPSULE | Freq: Three times a day (TID) | ORAL | Status: DC

## 2015-09-18 MED ORDER — FUROSEMIDE 20 MG PO TABS: 20.0000 mg | ORAL_TABLET | Freq: Every day | ORAL | Status: DC

## 2015-09-18 MED ORDER — POTASSIUM CHLORIDE ER 10 MEQ PO TBCR: 10.0000 meq | EXTENDED_RELEASE_TABLET | Freq: Every day | ORAL | Status: DC

## 2015-09-18 MED ORDER — PIOGLITAZONE HCL 30 MG PO TABS: 30.0000 mg | ORAL_TABLET | Freq: Every day | ORAL | Status: DC

## 2015-09-18 MED ORDER — LISINOPRIL 40 MG PO TABS: 40.0000 mg | ORAL_TABLET | Freq: Every day | ORAL | Status: DC

## 2015-09-18 MED ORDER — PRAVASTATIN SODIUM 40 MG PO TABS: 40.0000 mg | ORAL_TABLET | Freq: Every day | ORAL | Status: DC

## 2015-09-18 MED ORDER — METOPROLOL TARTRATE 25 MG PO TABS: 25.0000 mg | ORAL_TABLET | Freq: Two times a day (BID) | ORAL | Status: DC

## 2015-09-18 NOTE — Progress Notes (Signed)
Subjective:    Patient ID: Linda Briggs, female    DOB: 24-Sep-1955, 60 y.o.   MRN: [id number]  HPI 02/23/15 Patient is here today for complete physical exam. Since her recent dilation and curettage for bleeding, the patient's blood sugar has been much lower. Therefore she has reduced her insulin to 70 units in the morning and 40 units in the evening. Overall she is doing well. She is due for mammogram. She is due for colonoscopy. Pneumovax 23 is up-to-date. Diabetic foot exam is performed today.  At that time, my plan was: Patient's blood pressure is excellent. I will check a CBC, CMP, fasting lipid panel, and hemoglobin A1c. Goal LDL cholesterol is less than 100. Goal hemoglobin A1c is less than 7. Diabetic foot exam is performed today. I will schedule the patient for mammogram. I will schedule the patient for colonoscopy. We also recommended diet exercise and weight loss. Also encourage the patient to consider bariatric surgery for morbid obesity.  05/29/15 HgA1c in June was 6.6 but LDL was 108.  Overall, I was very happy with that.  Patient has worked hard on her diet as well as exercise and has lost 7 pounds since her last office visit. Unfortunately she is seen her sugars start to steadily increased and she is frequently seen sugars are around 200. She attributes this to stress as she is having to care for her mother who has Lewy body dementia. She denies eating more carbohydrates. She denies any recent infections. She denies any chest pain shortness of breath or dyspnea on exertion.  At that time, my plan was: Unfortunately the patient seems to be losing control of her diabetes. I believe this is likely due to the fact the glipizide has lost its effectiveness. Patient is unable to afford SGLT-1, SGLT-2, or GLP inhibitor medications.  Therefore her only option would be to increase her insulin or use Actos as a insulin sensitizer. She has significant edema in her legs. However she would like to try  Actos versus increasing her insulin and monitor for worsening edema. Therefore I will check a hemoglobin A1c. Hemoglobin A1c is greater than 8, I would add Actos 30 mg by mouth daily. Her blood pressure is acceptable to I will also check a fasting lipid panel. Goal LDL cholesterol is less than 100. Patient received her flu shot today. Pneumovax 23 is up-to-date  09/18/15 Fortunately since starting Actos, the patient has only gained 1 pound. Furthermore there is no significant edema in her legs. However she ran out of insulin for one month. Therefore she is only been on Actos and insulin together for the last 4 weeks. On this combination, she reports fasting blood sugars under 102 hour postprandial sugars under 180 which for this patient is excellent. Her blood pressure is also elevated but she ran out of metoprolol. She denies any chest pain shortness of breath or dyspnea on exertion. She is still not exercising. She is watching her diet. Past Medical History  Diagnosis Date  . Diabetes mellitus   . History of hepatitis B   . Neuropathy (HCC)     feet  . Bulging disc   . Arthritis     feet & toes  . Hyperlipidemia   . Hypertension   . Shortness of breath dyspnea     on lopressor   . 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 Peripheral vascular disease ([place])   . Sleep apnea  pt had study done but it was incomplete due to blood sugar issues   . Neuromuscular disorder ([place])     in feet and fingers    Past Surgical History  Procedure Laterality Date  . Tonsillectomy    . Tyroid biospy      january 2016  . Hysteroscopy w/d&c N/A 02/15/2015    Procedure: DILATATION AND CURETTAGE /HYSTEROSCOPY;  Surgeon: Linda Hedges, DO;  Location: [facility];  Service: Gynecology;  Laterality: N/A;   Current Outpatient Prescriptions on File Prior to Visit  Medication Sig Dispense Refill  . ACCU-CHEK SOFTCLIX LANCETS lancets Use to monitor FSBS 3x daily for fluctuating FSBS. DX: 250.00 100 each 12    . Alcohol Swabs (B-D SINGLE USE SWABS REGULAR) PADS Use to monitor FSBS 3x daily for fluctuating FSBS. DX: 250.00 300 each 1  . aspirin 325 MG tablet Take 325 mg by mouth daily.    . Blood Glucose Calibration (ACCU-CHEK AVIVA) SOLN Use to monitor FSBS 3x daily for fluctuating FSBS. DX: 250.00 3 each 3  . Blood Glucose Monitoring Suppl (ACCU-CHEK AVIVA PLUS) W/DEVICE KIT Use to monitor FSBS 3x daily for fluctuating FSBS. DX: 250.00 1 kit 0  . Carboxymethylcellulose Sodium (LUBRICANT EYE DROPS OP) Apply 1 drop to eye as needed (dry eyes or allergies).     . CHROMIUM-CINNAMON PO Take 1 capsule by mouth daily.    . Cyanocobalamin (VITAMIN B 12 PO) Take 1,000 mcg by mouth daily.     . furosemide (LASIX) 20 MG tablet Take 1 tablet (20 mg total) by mouth daily. 90 tablet 1  . gabapentin (NEURONTIN) 300 MG capsule Take 1 capsule (300 mg total) by mouth 3 (three) times daily. 270 capsule 1  . glipiZIDE (GLUCOTROL XL) 10 MG 24 hr tablet Take 1 tablet (10 mg total) by mouth daily with breakfast. 90 tablet 3  . glucose blood (ACCU-CHEK AVIVA) test strip Use to monitor FSBS 3x daily for fluctuating FSBS. DX: 250.00 100 each 12  . ibuprofen (ADVIL,MOTRIN) 800 MG tablet Take 1 tablet (800 mg total) by mouth every 8 (eight) hours as needed. 30 tablet 0  . insulin NPH-regular Human (NOVOLIN 70/30) (70-30) 100 UNIT/ML injection Inject 70 units into the skin in the morning, then 60 units into the skin at bedtime (Patient taking differently: Inject 70 Units into the skin 2 (two) times daily. Inject 70 units into the skin in the morning, then 40 units into the skin at bedtime) 10 vial 1  . lisinopril (PRINIVIL,ZESTRIL) 40 MG tablet Take 1 tablet (40 mg total) by mouth daily. 90 tablet 1  . medroxyPROGESTERone (PROVERA) 10 MG tablet 2 tablets daily.    . metFORMIN (GLUCOPHAGE) 1000 MG tablet Take 1 tablet (1,000 mg total) by mouth 2 (two) times daily with a meal. 180 tablet 1  . metoprolol tartrate (LOPRESSOR) 25 MG  tablet Take 1 tablet (25 mg total) by mouth 2 (two) times daily. 180 tablet 1  . Phenyleph-Doxylamine-DM-APAP (ALKA SELTZER PLUS PO) Take 1 tablet by mouth daily as needed (stomach pain).    . pioglitazone (ACTOS) 30 MG tablet Take 1 tablet (30 mg total) by mouth daily. 30 tablet 3  . potassium chloride (K-DUR) 10 MEQ tablet Take 1 tablet (10 mEq total) by mouth daily. 90 tablet 1  . pravastatin (PRAVACHOL) 40 MG tablet Take 1 tablet (40 mg total) by mouth daily. 90 tablet 3  . SPIRULINA PO Take 1 capsule by mouth daily.    . traMADol (ULTRAM) 50  MG tablet TAKE 1 TABLET EVERY 6 HOURS AS NEEDED (Patient taking differently: TAKE 1 TABLET EVERY 6 HOURS AS NEEDED for back pain) 60 tablet 2  . VITAMIN D, CHOLECALCIFEROL, PO Take 1,000 Units by mouth daily.     . vitamin E 400 UNIT capsule Take 400 Units by mouth daily.    . Bilberry 1000 MG CAPS Take 1 capsule by mouth daily. Reported on 09/18/2015    . calcium carbonate (TUMS - DOSED IN MG ELEMENTAL CALCIUM) 500 MG chewable tablet Chew 1 tablet by mouth as needed for indigestion or heartburn. Reported on 09/18/2015    . ondansetron (ZOFRAN) 4 MG tablet TAKE 1 TABLET EVERY 6 HOURS AS NEEDED FOR NAUSEA AND VOMITING (Patient not taking: Reported on 09/18/2015) 60 tablet 0   No current facility-administered medications on file prior to visit.   Allergies  Allergen Reactions  . Codeine Anaphylaxis and Hives  . Penicillins Anaphylaxis and Hives  . Food Nausea And Vomiting    Olives   . Januvia [Sitagliptin]     Stomach cramps  . Latex Hives   Social History   Social History  . Marital Status: Single    Spouse Name: N/A  . Number of Children: N/A  . Years of Education: N/A   Occupational History  . Not on file.   Social History Main Topics  . Smoking status: Never Smoker   . Smokeless tobacco: Never Used  . Alcohol Use: 0.0 oz/week     Comment: social drinker.  . Drug Use: No  . Sexual Activity: Not on file   Other Topics Concern  .  Not on file   Social History Narrative   Family History  Problem Relation Age of Onset  . Hyperlipidemia Mother   . Heart disease Father   . Hyperlipidemia Father   . Hypertension Father   . Heart disease Brother   . Stroke Brother       Review of Systems  All other systems reviewed and are negative.      Objective:   Physical Exam  Constitutional: She is oriented to person, place, and time. She appears well-developed and well-nourished. No distress.  HENT:  Head: Normocephalic and atraumatic.  Right Ear: External ear normal.  Left Ear: External ear normal.  Nose: Nose normal.  Mouth/Throat: Oropharynx is clear and moist. No oropharyngeal exudate.  Eyes: Conjunctivae and EOM are normal. Pupils are equal, round, and reactive to light. Right eye exhibits no discharge. Left eye exhibits no discharge. No scleral icterus.  Neck: Normal range of motion. Neck supple. No JVD present. No tracheal deviation present. No thyromegaly present.  Cardiovascular: Normal rate, regular rhythm, normal heart sounds and intact distal pulses.  Exam reveals no gallop and no friction rub.   No murmur heard. Pulmonary/Chest: Effort normal and breath sounds normal. No stridor. No respiratory distress. She has no wheezes. She has no rales. She exhibits no tenderness.  Abdominal: Soft. Bowel sounds are normal. She exhibits no distension and no mass. There is no tenderness. There is no rebound and no guarding.  Musculoskeletal: Normal range of motion. She exhibits no edema or tenderness.  Lymphadenopathy:    She has no cervical adenopathy.  Neurological: She is alert and oriented to person, place, and time. She has normal reflexes. No cranial nerve deficit. She exhibits normal muscle tone. Coordination normal.  Skin: Skin is warm. No rash noted. She is not diaphoretic. No erythema. No pallor.  Psychiatric: She has a normal mood and  affect. Her behavior is normal. Judgment and thought content normal.    Vitals reviewed.         Assessment & Plan:  Diabetes mellitus, insulin dependent (IDDM), controlled (HCC)  HLD (hyperlipidemia)  Essential hypertension  Continue novolin 70/30 70 units in the morning and 30 units in the evening. The patient would like to continue Actos as an insulin sensitizer. We will need to watch closely for weight gain and peripheral edema. I believe her blood pressure improved when she resumes the metoprolol. Cholesterol is acceptable. I have strongly encouraged patient to try to exercise including water aerobics which I believe she can perform even with her body habitus without exacerbating her joint problems in her knees and in her back. She agrees and she states that she will try to start performing this.

## 2015-10-27 ENCOUNTER — Other Ambulatory Visit: Payer: Self-pay | Admitting: Family Medicine

## 2015-10-27 NOTE — Telephone Encounter (Signed)
Refill appropriate and filled per protocol. 

## 2015-11-06 ENCOUNTER — Telehealth: Payer: Self-pay | Admitting: *Deleted

## 2015-11-06 NOTE — Telephone Encounter (Signed)
Submitted [facility] referral thru [facility] for authorization on 11/03/15 to Dr. Horton Chinachelle Davis,OD with authorization number [id number]  Requesting provider: Jaquelyn BitterWarren Pickard,MD  Treating provider: Horton Chinachelle Davis,OD  Number of visits:6  Start Date: 11/07/15  End Date:05/05/16  Dx:E11.9-Type 2 diabetes mellitus without complications      H35.63- Retinal hemorrhage,bilateral

## 2015-11-07 DIAGNOSIS — H40013 Open angle with borderline findings, low risk, bilateral: Secondary | ICD-10-CM | POA: Diagnosis not present

## 2015-11-07 DIAGNOSIS — Z794 Long term (current) use of insulin: Secondary | ICD-10-CM | POA: Diagnosis not present

## 2015-11-07 DIAGNOSIS — E119 Type 2 diabetes mellitus without complications: Secondary | ICD-10-CM | POA: Diagnosis not present

## 2015-11-07 DIAGNOSIS — H35363 Drusen (degenerative) of macula, bilateral: Secondary | ICD-10-CM | POA: Diagnosis not present

## 2015-11-07 LAB — HM DIABETES EYE EXAM

## 2016-01-01 ENCOUNTER — Encounter: Payer: Self-pay | Admitting: Family Medicine

## 2016-01-01 ENCOUNTER — Ambulatory Visit (INDEPENDENT_AMBULATORY_CARE_PROVIDER_SITE_OTHER): Payer: Commercial Managed Care - HMO | Admitting: Family Medicine

## 2016-01-01 VITALS — BP 130/68 | HR 72 | Temp 98.2°F | Resp 16 | Ht 66.0 in | Wt 319.0 lb

## 2016-01-01 DIAGNOSIS — H3500 Unspecified background retinopathy: Secondary | ICD-10-CM

## 2016-01-01 DIAGNOSIS — E1149 Type 2 diabetes mellitus with other diabetic neurological complication: Secondary | ICD-10-CM | POA: Diagnosis not present

## 2016-01-01 DIAGNOSIS — Z794 Long term (current) use of insulin: Secondary | ICD-10-CM

## 2016-01-01 DIAGNOSIS — H3509 Other intraretinal microvascular abnormalities: Secondary | ICD-10-CM | POA: Diagnosis not present

## 2016-01-01 DIAGNOSIS — I10 Essential (primary) hypertension: Secondary | ICD-10-CM | POA: Diagnosis not present

## 2016-01-01 DIAGNOSIS — E785 Hyperlipidemia, unspecified: Secondary | ICD-10-CM | POA: Diagnosis not present

## 2016-01-01 DIAGNOSIS — Z1231 Encounter for screening mammogram for malignant neoplasm of breast: Secondary | ICD-10-CM

## 2016-01-01 DIAGNOSIS — Z1239 Encounter for other screening for malignant neoplasm of breast: Secondary | ICD-10-CM

## 2016-01-01 DIAGNOSIS — IMO0002 Reserved for concepts with insufficient information to code with codable children: Secondary | ICD-10-CM

## 2016-01-01 DIAGNOSIS — E1165 Type 2 diabetes mellitus with hyperglycemia: Secondary | ICD-10-CM

## 2016-01-01 LAB — COMPLETE METABOLIC PANEL WITH GFR
ALT: 10 U/L (ref 6–29)
AST: 12 U/L (ref 10–35)
Albumin: 3.8 g/dL (ref 3.6–5.1)
Alkaline Phosphatase: 54 U/L (ref 33–130)
BUN: 17 mg/dL (ref 7–25)
CO2: 25 mmol/L (ref 20–31)
Calcium: 9 mg/dL (ref 8.6–10.4)
Chloride: 103 mmol/L (ref 98–110)
Creat: 0.76 mg/dL (ref 0.50–1.05)
GFR, EST NON AFRICAN AMERICAN: 87 mL/min (ref >=60)
GFR, Est African American: >89 mL/min (ref >=60)
GLUCOSE: 117 mg/dL — AB (ref 70–99)
POTASSIUM: 4.8 mmol/L (ref 3.5–5.3)
SODIUM: 139 mmol/L (ref 135–146)
TOTAL PROTEIN: 6.5 g/dL (ref 6.1–8.1)
Total Bilirubin: 0.7 mg/dL (ref 0.2–1.2)

## 2016-01-01 LAB — CBC WITH DIFFERENTIAL/PLATELET
BASOS ABS: 0 {cells}/uL (ref 0–200)
Basophils Relative: 0 %
EOS PCT: 3 %
Eosinophils Absolute: 315 cells/uL (ref 15–500)
HCT: 39.1 % (ref 35.0–45.0)
Hemoglobin: 12.9 g/dL (ref 12.0–15.0)
LYMPHS ABS: 2415 {cells}/uL (ref 850–3900)
Lymphocytes Relative: 23 %
MCH: 30.3 pg (ref 27.0–33.0)
MCHC: 33 g/dL (ref 32.0–36.0)
MCV: 91.8 fL (ref 80.0–100.0)
MONOS PCT: 4 %
MPV: 9.8 fL (ref 7.5–12.5)
Monocytes Absolute: 420 cells/uL (ref 200–950)
NEUTROS ABS: 7350 {cells}/uL (ref 1500–7800)
Neutrophils Relative %: 70 %
PLATELETS: 152 10*3/uL (ref 140–400)
RBC: 4.26 MIL/uL (ref 3.80–5.10)
RDW: 13.2 % (ref 11.0–15.0)
WBC: 10.5 10*3/uL (ref 3.8–10.8)

## 2016-01-01 LAB — LIPID PANEL
CHOL/HDL RATIO: 6 ratio — AB (ref <=5.0)
Cholesterol: 145 mg/dL (ref 125–200)
HDL: 24 mg/dL — AB (ref >=46)
LDL CALC: 100 mg/dL (ref <130)
Triglycerides: 106 mg/dL (ref <150)
VLDL: 21 mg/dL (ref <30)

## 2016-01-01 LAB — HEMOGLOBIN A1C
HEMOGLOBIN A1C: 6.2 % — AB (ref <5.7)
Mean Plasma Glucose: 131 mg/dL

## 2016-01-01 NOTE — Progress Notes (Signed)
Subjective:    Patient ID: Linda Briggs, female    DOB: 1956/01/07, 60 y.o.   MRN: [id number]  HPI 05/29/15 HgA1c in June was 6.6 but LDL was 108.  Overall, I was very happy with that.  Patient has worked hard on her diet as well as exercise and has lost 7 pounds since her last office visit. Unfortunately she is seen her sugars start to steadily increased and she is frequently seen sugars are around 200. She attributes this to stress as she is having to care for her mother who has Lewy body dementia. She denies eating more carbohydrates. She denies any recent infections. She denies any chest pain shortness of breath or dyspnea on exertion.  At that time, my plan was: Unfortunately the patient seems to be losing control of her diabetes. I believe this is likely due to the fact the glipizide has lost its effectiveness. Patient is unable to afford SGLT-1, SGLT-2, or GLP inhibitor medications.  Therefore her only option would be to increase her insulin or use Actos as a insulin sensitizer. She has significant edema in her legs. However she would like to try Actos versus increasing her insulin and monitor for worsening edema. Therefore I will check a hemoglobin A1c. Hemoglobin A1c is greater than 8, I would add Actos 30 mg by mouth daily. Her blood pressure is acceptable to I will also check a fasting lipid panel. Goal LDL cholesterol is less than 100. Patient received her flu shot today. Pneumovax 23 is up-to-date  09/18/15 Fortunately since starting Actos, the patient has only gained 1 pound. Furthermore there is no significant edema in her legs. However she ran out of insulin for one month. Therefore she is only been on Actos and insulin together for the last 4 weeks and even so, her HgA1c is 7.4. On this combination, she reports fasting blood sugars under 100, 2 hour postprandial sugars under 180 which for this patient is excellent. Her blood pressure is also elevated but she ran out of metoprolol. She  denies any chest pain shortness of breath or dyspnea on exertion. She is still not exercising. She is watching her diet.  At that time, my plan was: Continue novolin 70/30 70 units in the morning and 30 units in the evening. The patient would like to continue Actos as an insulin sensitizer. We will need to watch closely for weight gain and peripheral edema. I believe her blood pressure improved when she resumes the metoprolol. Cholesterol is acceptable. I have strongly encouraged patient to try to exercise including water aerobics which I believe she can perform even with her body habitus without exacerbating her joint problems in her knees and in her back. She agrees and she states that she will try to start performing this.  01/01/16 Apparently, the patient is responding well to Actos as an insulin sensitizer. It took a little longer than I expected however her insulin requirement has dropped dramatically. When I last saw the patient, she was taking insulin 7030, 70 units in the morning, and 30 units in the evening. She has gradually weaned herself all the way down to just 50 units in the morning and she continues to have occasional episodes of hypoglycemia with her blood sugar in the 70s and sometimes as low as 50. Her weight has gone up 4 pounds since her last office visit and she does believe that it is fluid retention as that her legs have swollen and become tighter however she is  willing to live with this as her sugars have responded dramatically and she is on much less insulin. She has decreased her total insulin consumption from 140 units down to 50 units a day since we started Actos. Her eye exam is up-to-date and was recently checked in March. 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 Unfortunately she continues to suffer from neuropathy in her legs reporting burning and stinging pain on the plantar aspects of both feet. She also has complete  and total numbness on the plantar aspects of both feet. She cannot feel me touch her feet at all. Past Medical History  Diagnosis Date  . Diabetes mellitus   . History of hepatitis B   . Neuropathy (HCC)     feet  . Bulging disc   . Arthritis     feet & toes  . Hyperlipidemia   . Hypertension   . Shortness of breath dyspnea     on lopressor   . 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 Peripheral vascular disease ([place])   . Sleep apnea     pt had study done but it was incomplete due to blood sugar issues   . Neuromuscular disorder ([place])     in feet and fingers    Past Surgical History  Procedure Laterality Date  . Tonsillectomy    . Tyroid biospy      january 2016  . Hysteroscopy w/d&c N/A 02/15/2015    Procedure: DILATATION AND CURETTAGE /HYSTEROSCOPY;  Surgeon: Linda Hedges, DO;  Location: [facility];  Service: Gynecology;  Laterality: N/A;   Current Outpatient Prescriptions on File Prior to Visit  Medication Sig Dispense Refill  . ACCU-CHEK SOFTCLIX LANCETS lancets Use to monitor FSBS 3x daily for fluctuating FSBS. DX: 250.00 100 each 12  . Alcohol Swabs (B-D SINGLE USE SWABS REGULAR) PADS Use to monitor FSBS 3x daily for fluctuating FSBS. DX: 250.00 300 each 1  . aspirin 325 MG tablet Take 325 mg by mouth daily.    . Bilberry 1000 MG CAPS Take 1 capsule by mouth daily. Reported on 09/18/2015    . Blood Glucose Calibration (ACCU-CHEK AVIVA) SOLN Use to monitor FSBS 3x daily for fluctuating FSBS. DX: 250.00 3 each 3  . Blood Glucose Monitoring Suppl (ACCU-CHEK AVIVA PLUS) W/DEVICE KIT Use to monitor FSBS 3x daily for fluctuating FSBS. DX: 250.00 1 kit 0  . calcium carbonate (TUMS - DOSED IN MG ELEMENTAL CALCIUM) 500 MG chewable tablet Chew 1 tablet by mouth as needed for indigestion or heartburn. Reported on 09/18/2015    . Carboxymethylcellulose Sodium (LUBRICANT EYE DROPS OP) Apply 1 drop to eye as needed (dry eyes or allergies).     . CHROMIUM-CINNAMON PO Take 1 capsule  by mouth daily.    . Cyanocobalamin (VITAMIN B 12 PO) Take 1,000 mcg by mouth daily.     . furosemide (LASIX) 20 MG tablet Take 1 tablet (20 mg total) by mouth daily. 90 tablet 3  . gabapentin (NEURONTIN) 300 MG capsule Take 1 capsule (300 mg total) by mouth 3 (three) times daily. 270 capsule 1  . glipiZIDE (GLUCOTROL XL) 10 MG 24 hr tablet Take 1 tablet (10 mg total) by mouth daily with breakfast. 90 tablet 3  . glucose blood (ACCU-CHEK AVIVA PLUS) test strip USE TO MONITOR FINGERSTICK BLOOD SUGAR 3x DAILY FOR FLUCTUATING FINGERSTICK BLOOD SUGAR: Dx: E11.65 300 each 12  . ibuprofen (ADVIL,MOTRIN) 800  MG tablet Take 1 tablet (800 mg total) by mouth every 8 (eight) hours as needed. 30 tablet 0  . insulin NPH-regular Human (NOVOLIN 70/30) (70-30) 100 UNIT/ML injection Inject 70 units into the skin in the morning, then 30 units into the skin at bedtime 10 vial 1  . lisinopril (PRINIVIL,ZESTRIL) 40 MG tablet Take 1 tablet (40 mg total) by mouth daily. 90 tablet 3  . medroxyPROGESTERone (PROVERA) 10 MG tablet 2 tablets daily.    . metFORMIN (GLUCOPHAGE) 1000 MG tablet Take 1 tablet (1,000 mg total) by mouth 2 (two) times daily with a meal. 180 tablet 3  . metoprolol tartrate (LOPRESSOR) 25 MG tablet Take 1 tablet (25 mg total) by mouth 2 (two) times daily. 180 tablet 1  . ondansetron (ZOFRAN) 4 MG tablet TAKE 1 TABLET EVERY 6 HOURS AS NEEDED FOR NAUSEA AND VOMITING (Patient not taking: Reported on 09/18/2015) 60 tablet 0  . Phenyleph-Doxylamine-DM-APAP (ALKA SELTZER PLUS PO) Take 1 tablet by mouth daily as needed (stomach pain).    . pioglitazone (ACTOS) 30 MG tablet Take 1 tablet (30 mg total) by mouth daily. 90 tablet 1  . potassium chloride (K-DUR) 10 MEQ tablet Take 1 tablet (10 mEq total) by mouth daily. 90 tablet 3  . pravastatin (PRAVACHOL) 40 MG tablet Take 1 tablet (40 mg total) by mouth daily. 90 tablet 3  . SPIRULINA PO Take 1 capsule by mouth daily.    . traMADol (ULTRAM) 50 MG tablet TAKE 1  TABLET EVERY 6 HOURS AS NEEDED (Patient taking differently: TAKE 1 TABLET EVERY 6 HOURS AS NEEDED for back pain) 60 tablet 2  . VITAMIN D, CHOLECALCIFEROL, PO Take 1,000 Units by mouth daily.     . vitamin E 400 UNIT capsule Take 400 Units by mouth daily.     No current facility-administered medications on file prior to visit.   Allergies  Allergen Reactions  . Codeine Anaphylaxis and Hives  . Penicillins Anaphylaxis and Hives  . Food Nausea And Vomiting    Olives   . Januvia [Sitagliptin]     Stomach cramps  . Latex Hives   Social History   Social History  . Marital Status: Single    Spouse Name: N/A  . Number of Children: N/A  . Years of Education: N/A   Occupational History  . Not on file.   Social History Main Topics  . Smoking status: Never Smoker   . Smokeless tobacco: Never Used  . Alcohol Use: 0.0 oz/week     Comment: social drinker.  . Drug Use: No  . Sexual Activity: Not on file   Other Topics Concern  . Not on file   Social History Narrative   Family History  Problem Relation Age of Onset  . Hyperlipidemia Mother   . Heart disease Father   . Hyperlipidemia Father   . Hypertension Father   . Heart disease Brother   . Stroke Brother       Review of Systems  All other systems reviewed and are negative.      Objective:   Physical Exam  Constitutional: She is oriented to person, place, and time. She appears well-developed and well-nourished. No distress.  HENT:  Head: Normocephalic and atraumatic.  Right Ear: External ear normal.  Left Ear: External ear normal.  Nose: Nose normal.  Mouth/Throat: Oropharynx is clear and moist. No oropharyngeal exudate.  Eyes: Conjunctivae and EOM are normal. Pupils are equal, round, and reactive to light. Right eye exhibits no discharge.  Left eye exhibits no discharge. No scleral icterus.  Neck: Normal range of motion. Neck supple. No JVD present. No tracheal deviation present. No thyromegaly present.    Cardiovascular: Normal rate, regular rhythm, normal heart sounds and intact distal pulses.  Exam reveals no gallop and no friction rub.   No murmur heard. Pulmonary/Chest: Effort normal and breath sounds normal. No stridor. No respiratory distress. She has no wheezes. She has no rales. She exhibits no tenderness.  Abdominal: Soft. Bowel sounds are normal. She exhibits no distension and no mass. There is no tenderness. There is no rebound and no guarding.  Musculoskeletal: Normal range of motion. She exhibits no edema or tenderness.  Lymphadenopathy:    She has no cervical adenopathy.  Neurological: She is alert and oriented to person, place, and time. She has normal reflexes. No cranial nerve deficit. She exhibits normal muscle tone. Coordination normal.  Skin: Skin is warm. No rash noted. She is not diaphoretic. No erythema. No pallor.  Psychiatric: She has a normal mood and affect. Her behavior is normal. Judgment and thought content normal.  Vitals reviewed.         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 I will check a fasting lipid panel and her goal LDL cholesterol is less than 100. Diabetic eye exam and diabetic foot exam are up-to-date. I recommended that the patient examined the plantar surfaces of both feet using a mirror every night given the fact that she has no feeling in her feet so that if she were to develop a sore or wound we can begin to treat it aggressively. Today there is no evidence of this on exam. She does have chronic venous stasis changes and lymphedema in both legs. Blood sugars  have dropped dramatically. I have recommended that we spread the insulin dosage out throughout the day to try to lessen hypoglycemic episodes. Since her total insulin requirement today is 50 units, I have recommended she use 35 units in the morning and 15 units in the afternoon. She finally consents to allow me to schedule her for a mammogram. She continues to refuse a colonoscopy. We did discuss cologuard.

## 2016-01-02 LAB — MICROALBUMIN, URINE: MICROALB UR: 1.1 mg/dL

## 2016-02-19 ENCOUNTER — Ambulatory Visit
Admission: RE | Admit: 2016-02-19 | Discharge: 2016-02-19 | Disposition: A | Discharge status: Home / Self Care | Payer: Commercial Managed Care - HMO | Source: Ambulatory Visit | Attending: Family Medicine | Admitting: Family Medicine

## 2016-02-19 DIAGNOSIS — Z1239 Encounter for other screening for malignant neoplasm of breast: Secondary | ICD-10-CM

## 2016-02-19 DIAGNOSIS — Z1231 Encounter for screening mammogram for malignant neoplasm of breast: Secondary | ICD-10-CM | POA: Diagnosis not present

## 2016-03-12 ENCOUNTER — Telehealth: Payer: Self-pay | Admitting: Cardiovascular Disease

## 2016-03-12 NOTE — Telephone Encounter (Signed)
lmov to schedule fu with office.  3 attempts to schedule from recall list. Deleting recall.

## 2016-03-20 ENCOUNTER — Telehealth: Payer: Self-pay | Admitting: Cardiovascular Disease

## 2016-03-20 NOTE — Telephone Encounter (Signed)
3 attempts to schedule fu from recall list.   LMOV to call office . Deleting recall.  °

## 2016-04-08 ENCOUNTER — Ambulatory Visit (INDEPENDENT_AMBULATORY_CARE_PROVIDER_SITE_OTHER): Payer: Commercial Managed Care - HMO | Admitting: Family Medicine

## 2016-04-08 ENCOUNTER — Encounter: Payer: Self-pay | Admitting: Family Medicine

## 2016-04-08 VITALS — BP 110/78 | HR 100 | Temp 98.3°F | Resp 20 | Ht 66.0 in | Wt 324.0 lb

## 2016-04-08 DIAGNOSIS — E118 Type 2 diabetes mellitus with unspecified complications: Secondary | ICD-10-CM

## 2016-04-08 DIAGNOSIS — E785 Hyperlipidemia, unspecified: Secondary | ICD-10-CM | POA: Diagnosis not present

## 2016-04-08 DIAGNOSIS — Z794 Long term (current) use of insulin: Secondary | ICD-10-CM

## 2016-04-08 DIAGNOSIS — I10 Essential (primary) hypertension: Secondary | ICD-10-CM | POA: Diagnosis not present

## 2016-04-08 LAB — COMPLETE METABOLIC PANEL WITH GFR
ALBUMIN: 4 g/dL (ref 3.6–5.1)
ALT: 11 U/L (ref 6–29)
AST: 13 U/L (ref 10–35)
Alkaline Phosphatase: 56 U/L (ref 33–130)
BUN: 16 mg/dL (ref 7–25)
CO2: 28 mmol/L (ref 20–31)
CREATININE: 0.89 mg/dL (ref 0.50–1.05)
Calcium: 9.4 mg/dL (ref 8.6–10.4)
Chloride: 102 mmol/L (ref 98–110)
GFR, Est African American: 83 mL/min (ref >=60)
GFR, Est Non African American: 72 mL/min (ref >=60)
GLUCOSE: 138 mg/dL — AB (ref 70–99)
Potassium: 5 mmol/L (ref 3.5–5.3)
Sodium: 140 mmol/L (ref 135–146)
TOTAL PROTEIN: 6.8 g/dL (ref 6.1–8.1)
Total Bilirubin: 0.6 mg/dL (ref 0.2–1.2)

## 2016-04-08 LAB — CBC WITH DIFFERENTIAL/PLATELET
BASOS PCT: 0 %
Basophils Absolute: 0 cells/uL (ref 0–200)
Eosinophils Absolute: 224 cells/uL (ref 15–500)
Eosinophils Relative: 2 %
HEMATOCRIT: 40.4 % (ref 35.0–45.0)
HEMOGLOBIN: 13.8 g/dL (ref 12.0–15.0)
LYMPHS ABS: 2576 {cells}/uL (ref 850–3900)
Lymphocytes Relative: 23 %
MCH: 31.6 pg (ref 27.0–33.0)
MCHC: 34.2 g/dL (ref 32.0–36.0)
MCV: 92.4 fL (ref 80.0–100.0)
MONO ABS: 560 {cells}/uL (ref 200–950)
MPV: 10.1 fL (ref 7.5–12.5)
Monocytes Relative: 5 %
NEUTROS ABS: 7840 {cells}/uL — AB (ref 1500–7800)
NEUTROS PCT: 70 %
Platelets: 186 10*3/uL (ref 140–400)
RBC: 4.37 MIL/uL (ref 3.80–5.10)
RDW: 14.2 % (ref 11.0–15.0)
WBC: 11.2 10*3/uL — AB (ref 3.8–10.8)

## 2016-04-08 LAB — LIPID PANEL
CHOL/HDL RATIO: 6.1 ratio — AB (ref <=5.0)
Cholesterol: 164 mg/dL (ref 125–200)
HDL: 27 mg/dL — AB (ref >=46)
LDL CALC: 111 mg/dL (ref <130)
Triglycerides: 130 mg/dL (ref <150)
VLDL: 26 mg/dL (ref <30)

## 2016-04-08 NOTE — Progress Notes (Signed)
Subjective:    Patient ID: Linda Briggs, female    DOB: 1956/07/16, 60 y.o.   MRN: [id number]  HPI  05/29/15 HgA1c in June was 6.6 but LDL was 108.  Overall, I was very happy with that.  Patient has worked hard on her diet as well as exercise and has lost 7 pounds since her last office visit. Unfortunately she is seen her sugars start to steadily increased and she is frequently seen sugars are around 200. She attributes this to stress as she is having to care for her mother who has Lewy body dementia. She denies eating more carbohydrates. She denies any recent infections. She denies any chest pain shortness of breath or dyspnea on exertion.  At that time, my plan was: Unfortunately the patient seems to be losing control of her diabetes. I believe this is likely due to the fact the glipizide has lost its effectiveness. Patient is unable to afford SGLT-1, SGLT-2, or GLP inhibitor medications.  Therefore her only option would be to increase her insulin or use Actos as a insulin sensitizer. She has significant edema in her legs. However she would like to try Actos versus increasing her insulin and monitor for worsening edema. Therefore I will check a hemoglobin A1c. Hemoglobin A1c is greater than 8, I would add Actos 30 mg by mouth daily. Her blood pressure is acceptable to I will also check a fasting lipid panel. Goal LDL cholesterol is less than 100. Patient received her flu shot today. Pneumovax 23 is up-to-date  09/18/15 Fortunately since starting Actos, the patient has only gained 1 pound. Furthermore there is no significant edema in her legs. However she ran out of insulin for one month. Therefore she is only been on Actos and insulin together for the last 4 weeks and even so, her HgA1c is 7.4. On this combination, she reports fasting blood sugars under 100, 2 hour postprandial sugars under 180 which for this patient is excellent. Her blood pressure is also elevated but she ran out of metoprolol. She  denies any chest pain shortness of breath or dyspnea on exertion. She is still not exercising. She is watching her diet.  At that time, my plan was: Continue novolin 70/30 70 units in the morning and 30 units in the evening. The patient would like to continue Actos as an insulin sensitizer. We will need to watch closely for weight gain and peripheral edema. I believe her blood pressure improved when she resumes the metoprolol. Cholesterol is acceptable. I have strongly encouraged patient to try to exercise including water aerobics which I believe she can perform even with her body habitus without exacerbating her joint problems in her knees and in her back. She agrees and she states that she will try to start performing this.  01/01/16 Apparently, the patient is responding well to Actos as an insulin sensitizer. It took a little longer than I expected however her insulin requirement has dropped dramatically. When I last saw the patient, she was taking insulin 7030, 70 units in the morning, and 30 units in the evening. She has gradually weaned herself all the way down to just 50 units in the morning and she continues to have occasional episodes of hypoglycemia with her blood sugar in the 70s and sometimes as low as 50. Her weight has gone up 4 pounds since her last office visit and she does believe that it is fluid retention as that her legs have swollen and become tighter however she  is willing to live with this as her sugars have responded dramatically and she is on much less insulin. She has decreased her total insulin consumption from 140 units down to 50 units a day since we started Actos. Her eye exam is up-to-date and was recently checked in March. 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 Unfortunately she continues to suffer from neuropathy in her legs reporting burning and stinging pain on the plantar aspects of both feet. She also has complete  and total numbness on the plantar aspects of both feet. She cannot feel me touch her feet at all.  At that time, my plan was: Her blood pressure today is acceptable and I will make no changes in her medication at this time. I will check a fasting lipid panel and her goal LDL cholesterol is less than 100. Diabetic eye exam and diabetic foot exam are up-to-date. I recommended that the patient examined the plantar surfaces of both feet using a mirror every night given the fact that she has no feeling in her feet so that if she were to develop a sore or wound we can begin to treat it aggressively. Today there is no evidence of this on exam. She does have chronic venous stasis changes and lymphedema in both legs. Blood sugars have dropped dramatically. I have recommended that we spread the insulin dosage out throughout the day to try to lessen hypoglycemic episodes. Since her total insulin requirement today is 50 units, I have recommended she use 35 units in the morning and 15 units in the afternoon. She finally consents to allow me to schedule her for a mammogram. She continues to refuse a colonoscopy. We did discuss cologuard.  04/08/16 Patient continues to do remarkably well. She is on Novolin 70/30. She is taking 30 units in the morning. She frequently does not take her insulin at night because she is experiencing hypoglycemia in the mornings. She seems to be requiring less and less insulin ever since we started her on Actos. She denies any polyuria, polydipsia, or blurred vision. Unfortunately she is experiencing worsening lymphedema in her legs on Actos and some weight gain. She is currently taking Lasix 20 mg a day. She denies any chest pain shortness of breath dyspnea on exertion or orthopnea. She is due for fasting lab work Past Medical History:  Diagnosis Date  . Arthritis    feet & toes  . Bulging disc   . Diabetes mellitus   . History of hepatitis B   . Hyperlipidemia   . Hypertension   .  Neuromuscular disorder ([place])    in feet and fingers   . Neuropathy (HCC)    feet  . 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 Peripheral vascular disease ([place])   . Shortness of breath dyspnea    on lopressor   . Sleep apnea    pt had study done but it was incomplete due to blood sugar issues    Past Surgical History:  Procedure Laterality Date  . HYSTEROSCOPY W/D&C N/A 02/15/2015   Procedure: DILATATION AND CURETTAGE /HYSTEROSCOPY;  Surgeon: Linda Hedges, DO;  Location: [facility];  Service: Gynecology;  Laterality: N/A;  . TONSILLECTOMY    . tyroid biospy     january 2016   Current Outpatient Prescriptions on File Prior to Visit  Medication Sig Dispense Refill  . ACCU-CHEK SOFTCLIX LANCETS lancets Use to monitor FSBS 3x daily for  fluctuating FSBS. DX: 250.00 100 each 12  . Alcohol Swabs (B-D SINGLE USE SWABS REGULAR) PADS Use to monitor FSBS 3x daily for fluctuating FSBS. DX: 250.00 300 each 1  . aspirin 325 MG tablet Take 325 mg by mouth daily.    . Bilberry 1000 MG CAPS Take 1 capsule by mouth daily. Reported on 09/18/2015    . Blood Glucose Calibration (ACCU-CHEK AVIVA) SOLN Use to monitor FSBS 3x daily for fluctuating FSBS. DX: 250.00 3 each 3  . Blood Glucose Monitoring Suppl (ACCU-CHEK AVIVA PLUS) W/DEVICE KIT Use to monitor FSBS 3x daily for fluctuating FSBS. DX: 250.00 1 kit 0  . calcium carbonate (TUMS - DOSED IN MG ELEMENTAL CALCIUM) 500 MG chewable tablet Chew 1 tablet by mouth as needed for indigestion or heartburn. Reported on 09/18/2015    . Carboxymethylcellulose Sodium (LUBRICANT EYE DROPS OP) Apply 1 drop to eye as needed (dry eyes or allergies).     . CHROMIUM-CINNAMON PO Take 1 capsule by mouth daily.    . Cyanocobalamin (VITAMIN B 12 PO) Take 1,000 mcg by mouth daily.     . furosemide (LASIX) 20 MG tablet Take 1 tablet (20 mg total) by mouth daily. 90 tablet 3  . gabapentin (NEURONTIN) 300 MG capsule Take 1 capsule (300 mg total) by mouth 3 (three) times daily.  270 capsule 1  . glipiZIDE (GLUCOTROL XL) 10 MG 24 hr tablet Take 1 tablet (10 mg total) by mouth daily with breakfast. 90 tablet 3  . glucose blood (ACCU-CHEK AVIVA PLUS) test strip USE TO MONITOR FINGERSTICK BLOOD SUGAR 3x DAILY FOR FLUCTUATING FINGERSTICK BLOOD SUGAR: Dx: E11.65 300 each 12  . ibuprofen (ADVIL,MOTRIN) 800 MG tablet Take 1 tablet (800 mg total) by mouth every 8 (eight) hours as needed. 30 tablet 0  . insulin NPH-regular Human (NOVOLIN 70/30) (70-30) 100 UNIT/ML injection Inject 70 units into the skin in the morning, then 30 units into the skin at bedtime 10 vial 1  . lisinopril (PRINIVIL,ZESTRIL) 40 MG tablet Take 1 tablet (40 mg total) by mouth daily. 90 tablet 3  . medroxyPROGESTERone (PROVERA) 10 MG tablet 2 tablets daily.    . metFORMIN (GLUCOPHAGE) 1000 MG tablet Take 1 tablet (1,000 mg total) by mouth 2 (two) times daily with a meal. 180 tablet 3  . metoprolol tartrate (LOPRESSOR) 25 MG tablet Take 1 tablet (25 mg total) by mouth 2 (two) times daily. 180 tablet 1  . ondansetron (ZOFRAN) 4 MG tablet TAKE 1 TABLET EVERY 6 HOURS AS NEEDED FOR NAUSEA AND VOMITING 60 tablet 0  . Phenyleph-Doxylamine-DM-APAP (ALKA SELTZER PLUS PO) Take 1 tablet by mouth daily as needed (stomach pain).    . pioglitazone (ACTOS) 30 MG tablet Take 1 tablet (30 mg total) by mouth daily. 90 tablet 1  . potassium chloride (K-DUR) 10 MEQ tablet Take 1 tablet (10 mEq total) by mouth daily. 90 tablet 3  . pravastatin (PRAVACHOL) 40 MG tablet Take 1 tablet (40 mg total) by mouth daily. 90 tablet 3  . SPIRULINA PO Take 1 capsule by mouth daily.    . traMADol (ULTRAM) 50 MG tablet TAKE 1 TABLET EVERY 6 HOURS AS NEEDED (Patient taking differently: TAKE 1 TABLET EVERY 6 HOURS AS NEEDED for back pain) 60 tablet 2  . VITAMIN D, CHOLECALCIFEROL, PO Take 1,000 Units by mouth daily.     . vitamin E 400 UNIT capsule Take 400 Units by mouth daily.     No current facility-administered medications on file prior to  visit.    Allergies  Allergen Reactions  . Codeine Anaphylaxis and Hives  . Penicillins Anaphylaxis and Hives  . Food Nausea And Vomiting    Olives   . Januvia [Sitagliptin]     Stomach cramps  . Latex Hives   Social History   Social History  . Marital status: Single    Spouse name: N/A  . Number of children: N/A  . Years of education: N/A   Occupational History  . Not on file.   Social History Main Topics  . Smoking status: Never Smoker  . Smokeless tobacco: Never Used  . Alcohol use 0.0 oz/week     Comment: social drinker.  . Drug use: No  . Sexual activity: Not on file   Other Topics Concern  . Not on file   Social History Narrative  . No narrative on file   Family History  Problem Relation Age of Onset  . Hyperlipidemia Mother   . Heart disease Father   . Hyperlipidemia Father   . Hypertension Father   . Heart disease Brother   . Stroke Brother       Review of Systems  All other systems reviewed and are negative.      Objective:   Physical Exam  Constitutional: She is oriented to person, place, and time. She appears well-developed and well-nourished. No distress.  HENT:  Head: Normocephalic and atraumatic.  Right Ear: External ear normal.  Left Ear: External ear normal.  Nose: Nose normal.  Mouth/Throat: Oropharynx is clear and moist. No oropharyngeal exudate.  Eyes: Conjunctivae and EOM are normal. Pupils are equal, round, and reactive to light. Right eye exhibits no discharge. Left eye exhibits no discharge. No scleral icterus.  Neck: Normal range of motion. Neck supple. No JVD present. No tracheal deviation present. No thyromegaly present.  Cardiovascular: Normal rate, regular rhythm, normal heart sounds and intact distal pulses.  Exam reveals no gallop and no friction rub.   No murmur heard. Pulmonary/Chest: Effort normal and breath sounds normal. No stridor. No respiratory distress. She has no wheezes. She has no rales. She exhibits no  tenderness.  Abdominal: Soft. Bowel sounds are normal. She exhibits no distension and no mass. There is no tenderness. There is no rebound and no guarding.  Musculoskeletal: Normal range of motion. She exhibits no edema or tenderness.  Lymphadenopathy:    She has no cervical adenopathy.  Neurological: She is alert and oriented to person, place, and time. She has normal reflexes. No cranial nerve deficit. She exhibits normal muscle tone. Coordination normal.  Skin: Skin is warm. No rash noted. She is not diaphoretic. No erythema. No pallor.  Psychiatric: She has a normal mood and affect. Her behavior is normal. Judgment and thought content normal.  Vitals reviewed.         Assessment & Plan:  Type 2 diabetes mellitus with complication, with long-term current use of insulin (HCC) - Plan: CBC with Differential/Platelet, COMPLETE METABOLIC PANEL WITH GFR, Lipid panel, Microalbumin, urine, Hemoglobin A1c  Essential hypertension  Hyperlipidemia  Check hemoglobin A1c. If less than 6.5 I would recommend switching her Novolin 70/3020 units in the morning and 10 units in the evening to try to spread her insulin out more evenly throughout the day. Her blood pressures well controlled. I will check a fasting lipid panel along with microalbumin. Goal LDL cholesterol is less than 100.

## 2016-04-09 ENCOUNTER — Other Ambulatory Visit: Payer: Self-pay | Admitting: Family Medicine

## 2016-04-09 LAB — HEMOGLOBIN A1C
HEMOGLOBIN A1C: 5.8 % — AB (ref <5.7)
Mean Plasma Glucose: 120 mg/dL

## 2016-04-09 LAB — MICROALBUMIN, URINE: MICROALB UR: 0.9 mg/dL

## 2016-04-09 NOTE — Telephone Encounter (Signed)
Refill appropriate and filled per protocol. 

## 2016-04-30 ENCOUNTER — Telehealth: Payer: Self-pay | Admitting: Family Medicine

## 2016-04-30 DIAGNOSIS — E118 Type 2 diabetes mellitus with unspecified complications: Secondary | ICD-10-CM

## 2016-04-30 NOTE — Telephone Encounter (Signed)
Ms. Yetta BarreJones called saying she thinks she needs a Referral to the Podiatrist she's seen before. She has [employer] but is pretty sure the previous referral has run out. Two of her toe nails came completely off yesterday and were bleeding. She's concerned about this due to being diabetic. She wasn't sure if she needed to come in to see Dr. Tanya NonesPickard first.  In addition, she has an eye appointment at [facility]. She'll be seeing Dr. Wynell Balloonhristopher Weaver on Tuesday, September 5th. Her previous referral ends on September 3rd. The ICD-10 code for her appt is: E11.9 (per Misha at the office) [facility] ID[facility] they have on file is: [id number] Office ph# [phone] / Office fax# [phone]  Please call Ms. Yetta BarreJones regarding this. Pt's ph# [phone] Thank you.

## 2016-05-03 NOTE — Telephone Encounter (Signed)
Referral to Podiatrist placed and [facility].

## 2016-05-07 ENCOUNTER — Telehealth: Payer: Self-pay | Admitting: Family Medicine

## 2016-05-07 DIAGNOSIS — H11153 Pinguecula, bilateral: Secondary | ICD-10-CM | POA: Diagnosis not present

## 2016-05-07 DIAGNOSIS — H40013 Open angle with borderline findings, low risk, bilateral: Secondary | ICD-10-CM | POA: Diagnosis not present

## 2016-05-07 DIAGNOSIS — E113293 Type 2 diabetes mellitus with mild nonproliferative diabetic retinopathy without macular edema, bilateral: Secondary | ICD-10-CM | POA: Diagnosis not present

## 2016-05-07 DIAGNOSIS — H2513 Age-related nuclear cataract, bilateral: Secondary | ICD-10-CM | POA: Diagnosis not present

## 2016-05-07 DIAGNOSIS — E119 Type 2 diabetes mellitus without complications: Secondary | ICD-10-CM | POA: Diagnosis not present

## 2016-05-07 LAB — HM DIABETES EYE EXAM

## 2016-05-07 NOTE — Telephone Encounter (Signed)
Referral for Podiatrist  Dr Stacie AcresMayer approved auth # [id number] for 6 visits from 05/03/16 - 10/30/16  For diabetic foot care E11.8  And  Referral for Eye care.  Dr Alben SpittleWeaver approved auth # [id number] for 6 visits from 05/07/16 - 11/03/16 for diabetic eye care E11.8.

## 2016-05-15 ENCOUNTER — Ambulatory Visit: Payer: Commercial Managed Care - HMO | Admitting: Podiatry

## 2016-05-29 ENCOUNTER — Encounter: Payer: Self-pay | Admitting: Family Medicine

## 2016-05-29 DIAGNOSIS — E113299 Type 2 diabetes mellitus with mild nonproliferative diabetic retinopathy without macular edema, unspecified eye: Secondary | ICD-10-CM

## 2016-05-29 DIAGNOSIS — E11319 Type 2 diabetes mellitus with unspecified diabetic retinopathy without macular edema: Secondary | ICD-10-CM | POA: Insufficient documentation

## 2016-06-03 ENCOUNTER — Other Ambulatory Visit: Payer: Self-pay | Admitting: Family Medicine

## 2016-07-22 ENCOUNTER — Ambulatory Visit (INDEPENDENT_AMBULATORY_CARE_PROVIDER_SITE_OTHER): Payer: Commercial Managed Care - HMO | Admitting: Family Medicine

## 2016-07-22 ENCOUNTER — Encounter: Payer: Self-pay | Admitting: Family Medicine

## 2016-07-22 VITALS — BP 140/72 | HR 86 | Temp 98.0°F | Resp 20 | Ht 66.0 in | Wt 332.0 lb

## 2016-07-22 DIAGNOSIS — I10 Essential (primary) hypertension: Secondary | ICD-10-CM

## 2016-07-22 DIAGNOSIS — E7800 Pure hypercholesterolemia, unspecified: Secondary | ICD-10-CM

## 2016-07-22 DIAGNOSIS — E119 Type 2 diabetes mellitus without complications: Secondary | ICD-10-CM

## 2016-07-22 DIAGNOSIS — M5431 Sciatica, right side: Secondary | ICD-10-CM

## 2016-07-22 LAB — CBC WITH DIFFERENTIAL/PLATELET
BASOS ABS: 0 {cells}/uL (ref 0–200)
Basophils Relative: 0 %
EOS ABS: 176 {cells}/uL (ref 15–500)
Eosinophils Relative: 2 %
HCT: 37.5 % (ref 35.0–45.0)
HEMOGLOBIN: 12.4 g/dL (ref 12.0–15.0)
Lymphocytes Relative: 21 %
Lymphs Abs: 1848 cells/uL (ref 850–3900)
MCH: 31.1 pg (ref 27.0–33.0)
MCHC: 33.1 g/dL (ref 32.0–36.0)
MCV: 94 fL (ref 80.0–100.0)
MONOS PCT: 4 %
MPV: 9.4 fL (ref 7.5–12.5)
Monocytes Absolute: 352 cells/uL (ref 200–950)
NEUTROS ABS: 6424 {cells}/uL (ref 1500–7800)
NEUTROS PCT: 73 %
Platelets: 165 10*3/uL (ref 140–400)
RBC: 3.99 MIL/uL (ref 3.80–5.10)
RDW: 14.3 % (ref 11.0–15.0)
WBC: 8.8 10*3/uL (ref 3.8–10.8)

## 2016-07-22 LAB — LIPID PANEL
CHOLESTEROL: 143 mg/dL (ref <200)
HDL: 28 mg/dL — ABNORMAL LOW (ref >50)
LDL Cholesterol: 97 mg/dL (ref <100)
Total CHOL/HDL Ratio: 5.1 Ratio — ABNORMAL HIGH (ref <5.0)
Triglycerides: 88 mg/dL (ref <150)
VLDL: 18 mg/dL (ref <30)

## 2016-07-22 LAB — COMPLETE METABOLIC PANEL WITH GFR
ALBUMIN: 4 g/dL (ref 3.6–5.1)
ALK PHOS: 68 U/L (ref 33–130)
ALT: 13 U/L (ref 6–29)
AST: 15 U/L (ref 10–35)
BILIRUBIN TOTAL: 0.6 mg/dL (ref 0.2–1.2)
BUN: 17 mg/dL (ref 7–25)
CO2: 27 mmol/L (ref 20–31)
Calcium: 8.9 mg/dL (ref 8.6–10.4)
Chloride: 103 mmol/L (ref 98–110)
Creat: 0.76 mg/dL (ref 0.50–1.05)
GFR, EST NON AFRICAN AMERICAN: 86 mL/min (ref >=60)
Glucose, Bld: 135 mg/dL — ABNORMAL HIGH (ref 70–99)
POTASSIUM: 4.7 mmol/L (ref 3.5–5.3)
Sodium: 138 mmol/L (ref 135–146)
TOTAL PROTEIN: 6.6 g/dL (ref 6.1–8.1)

## 2016-07-22 MED ORDER — TRAMADOL HCL 50 MG PO TABS: 50.0000 mg | ORAL_TABLET | Freq: Four times a day (QID) | ORAL | 2 refills | Status: DC | PRN

## 2016-07-22 MED ORDER — PREDNISONE 20 MG PO TABS: ORAL_TABLET | ORAL | 0 refills | Status: DC

## 2016-07-22 NOTE — Progress Notes (Signed)
Subjective:    Patient ID: Linda Briggs, female    DOB: [birth year], 60 y.o.   MRN: [id number]  HPI  05/29/15 HgA1c in June was 6.6 but LDL was 108.  Overall, I was very happy with that.  Patient has worked hard on her diet as well as exercise and has lost 7 pounds since her last office visit. Unfortunately she is seen her sugars start to steadily increased and she is frequently seen sugars are around 200. She attributes this to stress as she is having to care for her mother who has Lewy body dementia. She denies eating more carbohydrates. She denies any recent infections. She denies any chest pain shortness of breath or dyspnea on exertion.  At that time, my plan was: Unfortunately the patient seems to be losing control of her diabetes. I believe this is likely due to the fact the glipizide has lost its effectiveness. Patient is unable to afford SGLT-1, SGLT-2, or GLP inhibitor medications.  Therefore her only option would be to increase her insulin or use Actos as a insulin sensitizer. She has significant edema in her legs. However she would like to try Actos versus increasing her insulin and monitor for worsening edema. Therefore I will check a hemoglobin A1c. Hemoglobin A1c is greater than 8, I would add Actos 30 mg by mouth daily. Her blood pressure is acceptable to I will also check a fasting lipid panel. Goal LDL cholesterol is less than 100. Patient received her flu shot today. Pneumovax 23 is up-to-date  09/18/15 Fortunately since starting Actos, the patient has only gained 1 pound. Furthermore there is no significant edema in her legs. However she ran out of insulin for one month. Therefore she is only been on Actos and insulin together for the last 4 weeks and even so, her HgA1c is 7.4. On this combination, she reports fasting blood sugars under 100, 2 hour postprandial sugars under 180 which for this patient is excellent. Her blood pressure is also elevated but she ran out of metoprolol. She  denies any chest pain shortness of breath or dyspnea on exertion. She is still not exercising. She is watching her diet.  At that time, my plan was: Continue novolin 70/30 70 units in the morning and 30 units in the evening. The patient would like to continue Actos as an insulin sensitizer. We will need to watch closely for weight gain and peripheral edema. I believe her blood pressure improved when she resumes the metoprolol. Cholesterol is acceptable. I have strongly encouraged patient to try to exercise including water aerobics which I believe she can perform even with her body habitus without exacerbating her joint problems in her knees and in her back. She agrees and she states that she will try to start performing this.  01/01/16 Apparently, the patient is responding well to Actos as an insulin sensitizer. It took a little longer than I expected however her insulin requirement has dropped dramatically. When I last saw the patient, she was taking insulin 7030, 70 units in the morning, and 30 units in the evening. She has gradually weaned herself all the way down to just 50 units in the morning and she continues to have occasional episodes of hypoglycemia with her blood sugar in the 70s and sometimes as low as 50. Her weight has gone up 4 pounds since her last office visit and she does believe that it is fluid retention as that her legs have swollen and become tighter however she  is willing to live with this as her sugars have responded dramatically and she is on much less insulin. She has decreased her total insulin consumption from 140 units down to 50 units a day since we started Actos. Her eye exam is up-to-date and was recently checked in March. 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 Unfortunately she continues to suffer from neuropathy in her legs reporting burning and stinging pain on the plantar aspects of both feet. She also has complete  and total numbness on the plantar aspects of both feet. She cannot feel me touch her feet at all.  At that time, my plan was: Her blood pressure today is acceptable and I will make no changes in her medication at this time. I will check a fasting lipid panel and her goal LDL cholesterol is less than 100. Diabetic eye exam and diabetic foot exam are up-to-date. I recommended that the patient examined the plantar surfaces of both feet using a mirror every night given the fact that she has no feeling in her feet so that if she were to develop a sore or wound we can begin to treat it aggressively. Today there is no evidence of this on exam. She does have chronic venous stasis changes and lymphedema in both legs. Blood sugars have dropped dramatically. I have recommended that we spread the insulin dosage out throughout the day to try to lessen hypoglycemic episodes. Since her total insulin requirement today is 50 units, I have recommended she use 35 units in the morning and 15 units in the afternoon. She finally consents to allow me to schedule her for a mammogram. She continues to refuse a colonoscopy. We did discuss cologuard.  04/08/16 Patient continues to do remarkably well. She is on Novolin 70/30. She is taking 30 units in the morning. She frequently does not take her insulin at night because she is experiencing hypoglycemia in the mornings. She seems to be requiring less and less insulin ever since we started her on Actos. She denies any polyuria, polydipsia, or blurred vision. Unfortunately she is experiencing worsening lymphedema in her legs on Actos and some weight gain. She is currently taking Lasix 20 mg a day. She denies any chest pain shortness of breath dyspnea on exertion or orthopnea. She is due for fasting lab work.    07/22/16 She is here today for follow-up. Her last hemoglobin A1c was remarkable at 5.8. We discontinued some of her medication. She states that her fasting blood sugars between 101  130. She states that her two-hour postprandial sugars are typically less than 160. Unfortunately since last time I saw her she is developed severe right-sided low back pain with right-sided sciatica radiating from her hip into her right foot. She has persistent neuropathy chronically and therefore she denies any new numbness or tingling in her foot. She denies any chest pain shortness of breath or dyspnea on exertion. Past Medical History:  Diagnosis Date  . Arthritis    feet & toes  . Bulging disc   . Diabetes mellitus   . History of hepatitis B   . Hyperlipidemia   . Hypertension   . Neuromuscular disorder ([place])    in feet and fingers   . Neuropathy ([facility])    feet  . 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 Peripheral vascular disease ([place])   . Shortness of breath dyspnea    on lopressor   .  Sleep apnea    pt had study done but it was incomplete due to blood sugar issues    Past Surgical History:  Procedure Laterality Date  . HYSTEROSCOPY W/D&C N/A 02/15/2015   Procedure: DILATATION AND CURETTAGE /HYSTEROSCOPY;  Surgeon: Linda Hedges, DO;  Location: [facility];  Service: Gynecology;  Laterality: N/A;  . TONSILLECTOMY    . tyroid biospy     january 2016   Current Outpatient Prescriptions on File Prior to Visit  Medication Sig Dispense Refill  . ACCU-CHEK SOFTCLIX LANCETS lancets Use to monitor FSBS 3x daily for fluctuating FSBS. DX: 250.00 100 each 12  . Alcohol Swabs (B-D SINGLE USE SWABS REGULAR) PADS Use to monitor FSBS 3x daily for fluctuating FSBS. DX: 250.00 300 each 1  . aspirin 325 MG tablet Take 325 mg by mouth daily.    . Bilberry 1000 MG CAPS Take 1 capsule by mouth daily. Reported on 09/18/2015    . Blood Glucose Calibration (ACCU-CHEK AVIVA) SOLN Use to monitor FSBS 3x daily for fluctuating FSBS. DX: 250.00 3 each 3  . Blood Glucose Monitoring Suppl (ACCU-CHEK AVIVA PLUS) W/DEVICE KIT Use to monitor FSBS 3x daily for fluctuating FSBS. DX: 250.00 1 kit 0  . calcium  carbonate (TUMS - DOSED IN MG ELEMENTAL CALCIUM) 500 MG chewable tablet Chew 1 tablet by mouth as needed for indigestion or heartburn. Reported on 09/18/2015    . Carboxymethylcellulose Sodium (LUBRICANT EYE DROPS OP) Apply 1 drop to eye as needed (dry eyes or allergies).     . CHROMIUM-CINNAMON PO Take 1 capsule by mouth daily.    . Cyanocobalamin (VITAMIN B 12 PO) Take 1,000 mcg by mouth daily.     . furosemide (LASIX) 20 MG tablet Take 1 tablet (20 mg total) by mouth daily. 90 tablet 3  . gabapentin (NEURONTIN) 300 MG capsule TAKE 1 CAPSULE (300 MG TOTAL) BY MOUTH 3 (THREE) TIMES DAILY. 270 capsule 1  . glipiZIDE (GLUCOTROL XL) 10 MG 24 hr tablet Take 1 tablet (10 mg total) by mouth daily with breakfast. 90 tablet 3  . glucose blood (ACCU-CHEK AVIVA PLUS) test strip USE TO MONITOR FINGERSTICK BLOOD SUGAR 3x DAILY FOR FLUCTUATING FINGERSTICK BLOOD SUGAR: Dx: E11.65 300 each 12  . ibuprofen (ADVIL,MOTRIN) 800 MG tablet Take 1 tablet (800 mg total) by mouth every 8 (eight) hours as needed. 30 tablet 0  . lisinopril (PRINIVIL,ZESTRIL) 40 MG tablet Take 1 tablet (40 mg total) by mouth daily. 90 tablet 3  . medroxyPROGESTERone (PROVERA) 10 MG tablet 2 tablets daily.    . metFORMIN (GLUCOPHAGE) 1000 MG tablet Take 1 tablet (1,000 mg total) by mouth 2 (two) times daily with a meal. 180 tablet 3  . metoprolol tartrate (LOPRESSOR) 25 MG tablet TAKE 1 TABLET (25 MG TOTAL) BY MOUTH 2 (TWO) TIMES DAILY. 180 tablet 1  . NOVOLIN 70/30 (70-30) 100 UNIT/ML injection INJECT 70 UNITS INTO THE SKIN IN THE MORNING, THEN 30 UNITS INTO THE SKIN AT BEDTIME 90 mL 1  . ondansetron (ZOFRAN) 4 MG tablet TAKE 1 TABLET EVERY 6 HOURS AS NEEDED FOR NAUSEA AND VOMITING 60 tablet 0  . Phenyleph-Doxylamine-DM-APAP (ALKA SELTZER PLUS PO) Take 1 tablet by mouth daily as needed (stomach pain).    . pioglitazone (ACTOS) 30 MG tablet TAKE 1 TABLET (30 MG TOTAL) BY MOUTH DAILY. 90 tablet 1  . potassium chloride (K-DUR) 10 MEQ tablet Take  1 tablet (10 mEq total) by mouth daily. 90 tablet 3  . pravastatin (PRAVACHOL) 40 MG  tablet Take 1 tablet (40 mg total) by mouth daily. 90 tablet 3  . SPIRULINA PO Take 1 capsule by mouth daily.    Marland Kitchen VITAMIN D, CHOLECALCIFEROL, PO Take 1,000 Units by mouth daily.     . vitamin E 400 UNIT capsule Take 400 Units by mouth daily.     No current facility-administered medications on file prior to visit.    Allergies  Allergen Reactions  . Codeine Anaphylaxis and Hives  . Penicillins Anaphylaxis and Hives  . Food Nausea And Vomiting    Olives   . Januvia [Sitagliptin]     Stomach cramps  . Latex Hives   Social History   Social History  . Marital status: Single    Spouse name: N/A  . Number of children: N/A  . Years of education: N/A   Occupational History  . Not on file.   Social History Main Topics  . Smoking status: Never Smoker  . Smokeless tobacco: Never Used  . Alcohol use 0.0 oz/week     Comment: social drinker.  . Drug use: No  . Sexual activity: Not on file   Other Topics Concern  . Not on file   Social History Narrative  . No narrative on file   Family History  Problem Relation Age of Onset  . Hyperlipidemia Mother   . Heart disease Father   . Hyperlipidemia Father   . Hypertension Father   . Heart disease Brother   . Stroke Brother       Review of Systems  Musculoskeletal: Positive for back pain.  All other systems reviewed and are negative.      Objective:   Physical Exam  Constitutional: She is oriented to person, place, and time. She appears well-developed and well-nourished. No distress.  HENT:  Head: Normocephalic and atraumatic.  Right Ear: External ear normal.  Left Ear: External ear normal.  Nose: Nose normal.  Mouth/Throat: Oropharynx is clear and moist. No oropharyngeal exudate.  Eyes: Conjunctivae and EOM are normal. Pupils are equal, round, and reactive to light. Right eye exhibits no discharge. Left eye exhibits no discharge. No  scleral icterus.  Neck: Normal range of motion. Neck supple. No JVD present. No tracheal deviation present. No thyromegaly present.  Cardiovascular: Normal rate, regular rhythm, normal heart sounds and intact distal pulses.  Exam reveals no gallop and no friction rub.   No murmur heard. Pulmonary/Chest: Effort normal and breath sounds normal. No stridor. No respiratory distress. She has no wheezes. She has no rales. She exhibits no tenderness.  Abdominal: Soft. Bowel sounds are normal. She exhibits no distension and no mass. There is no tenderness. There is no rebound and no guarding.  Musculoskeletal: Normal range of motion. She exhibits no edema or tenderness.  Lymphadenopathy:    She has no cervical adenopathy.  Neurological: She is alert and oriented to person, place, and time. She has normal reflexes. No cranial nerve deficit. She exhibits normal muscle tone. Coordination normal.  Skin: Skin is warm. No rash noted. She is not diaphoretic. No erythema. No pallor.  Psychiatric: She has a normal mood and affect. Her behavior is normal. Judgment and thought content normal.  Vitals reviewed.         Assessment & Plan:  Diabetes mellitus type II, non insulin dependent ([place]) - Plan: CBC with Differential/Platelet, COMPLETE METABOLIC PANEL WITH GFR, Hemoglobin A1c, Lipid panel  Pure hypercholesterolemia  Essential hypertension  Right sided sciatica  Patient is having severe right-sided sciatica. Begin prednisone  taper pack. Also use tramadol 50 mg every 8 hours when necessary pain. Check hemoglobin A1c. Goal hemoglobin A1c is less than 6.5. Blood pressure is acceptable. I will check a fasting lipid panel. Goal LDL cholesterol is less than 100

## 2016-07-23 LAB — HEMOGLOBIN A1C
HEMOGLOBIN A1C: 6 % — AB (ref <5.7)
MEAN PLASMA GLUCOSE: 126 mg/dL

## 2016-08-28 ENCOUNTER — Other Ambulatory Visit: Payer: Self-pay | Admitting: Family Medicine

## 2016-08-28 MED ORDER — METOPROLOL TARTRATE 25 MG PO TABS: 25.0000 mg | ORAL_TABLET | Freq: Two times a day (BID) | ORAL | 3 refills | Status: DC

## 2016-08-28 MED ORDER — GLIPIZIDE ER 10 MG PO TB24: 10.0000 mg | ORAL_TABLET | Freq: Every day | ORAL | 3 refills | Status: DC

## 2016-08-28 MED ORDER — POTASSIUM CHLORIDE ER 10 MEQ PO TBCR: 10.0000 meq | EXTENDED_RELEASE_TABLET | Freq: Every day | ORAL | 3 refills | Status: DC

## 2016-08-28 MED ORDER — PRAVASTATIN SODIUM 40 MG PO TABS: 40.0000 mg | ORAL_TABLET | Freq: Every day | ORAL | 3 refills | Status: DC

## 2016-08-28 MED ORDER — FUROSEMIDE 20 MG PO TABS: 20.0000 mg | ORAL_TABLET | Freq: Every day | ORAL | 3 refills | Status: DC

## 2016-08-28 NOTE — Telephone Encounter (Signed)
Medication called/sent to requested pharmacy  

## 2016-10-28 ENCOUNTER — Ambulatory Visit: Payer: Commercial Managed Care - HMO | Admitting: Family Medicine

## 2016-10-28 ENCOUNTER — Other Ambulatory Visit: Payer: Self-pay | Admitting: Family Medicine

## 2016-11-05 ENCOUNTER — Encounter: Payer: Self-pay | Admitting: Family Medicine

## 2016-11-05 ENCOUNTER — Ambulatory Visit (INDEPENDENT_AMBULATORY_CARE_PROVIDER_SITE_OTHER): Payer: Medicare HMO | Admitting: Family Medicine

## 2016-11-05 VITALS — BP 140/80 | HR 74 | Temp 98.1°F | Resp 20 | Ht 66.0 in | Wt 322.0 lb

## 2016-11-05 DIAGNOSIS — I10 Essential (primary) hypertension: Secondary | ICD-10-CM

## 2016-11-05 DIAGNOSIS — E042 Nontoxic multinodular goiter: Secondary | ICD-10-CM | POA: Diagnosis not present

## 2016-11-05 DIAGNOSIS — E7800 Pure hypercholesterolemia, unspecified: Secondary | ICD-10-CM

## 2016-11-05 DIAGNOSIS — E118 Type 2 diabetes mellitus with unspecified complications: Secondary | ICD-10-CM

## 2016-11-05 DIAGNOSIS — Z794 Long term (current) use of insulin: Secondary | ICD-10-CM | POA: Diagnosis not present

## 2016-11-05 LAB — CBC WITH DIFFERENTIAL/PLATELET
BASOS ABS: 0 {cells}/uL (ref 0–200)
BASOS PCT: 0 %
Eosinophils Absolute: 192 cells/uL (ref 15–500)
Eosinophils Relative: 2 %
HCT: 40.1 % (ref 35.0–45.0)
Hemoglobin: 13.3 g/dL (ref 12.0–15.0)
Lymphocytes Relative: 24 %
Lymphs Abs: 2304 cells/uL (ref 850–3900)
MCH: 30.6 pg (ref 27.0–33.0)
MCHC: 33.2 g/dL (ref 32.0–36.0)
MCV: 92.2 fL (ref 80.0–100.0)
MONO ABS: 384 {cells}/uL (ref 200–950)
MONOS PCT: 4 %
MPV: 9.9 fL (ref 7.5–12.5)
NEUTROS ABS: 6720 {cells}/uL (ref 1500–7800)
Neutrophils Relative %: 70 %
PLATELETS: 192 10*3/uL (ref 140–400)
RBC: 4.35 MIL/uL (ref 3.80–5.10)
RDW: 14.1 % (ref 11.0–15.0)
WBC: 9.6 10*3/uL (ref 3.8–10.8)

## 2016-11-05 LAB — COMPLETE METABOLIC PANEL WITH GFR
ALK PHOS: 67 U/L (ref 33–130)
ALT: 13 U/L (ref 6–29)
AST: 15 U/L (ref 10–35)
Albumin: 4 g/dL (ref 3.6–5.1)
BUN: 17 mg/dL (ref 7–25)
CO2: 24 mmol/L (ref 20–31)
CREATININE: 0.96 mg/dL (ref 0.50–1.05)
Calcium: 9.1 mg/dL (ref 8.6–10.4)
Chloride: 103 mmol/L (ref 98–110)
GFR, EST NON AFRICAN AMERICAN: 65 mL/min (ref >=60)
GFR, Est African American: 75 mL/min (ref >=60)
Glucose, Bld: 121 mg/dL — ABNORMAL HIGH (ref 70–99)
POTASSIUM: 4.5 mmol/L (ref 3.5–5.3)
SODIUM: 139 mmol/L (ref 135–146)
TOTAL PROTEIN: 6.7 g/dL (ref 6.1–8.1)
Total Bilirubin: 0.5 mg/dL (ref 0.2–1.2)

## 2016-11-05 LAB — LIPID PANEL
CHOLESTEROL: 150 mg/dL (ref <200)
HDL: 27 mg/dL — ABNORMAL LOW (ref >50)
LDL Cholesterol: 98 mg/dL (ref <100)
TRIGLYCERIDES: 126 mg/dL (ref <150)
Total CHOL/HDL Ratio: 5.6 Ratio — ABNORMAL HIGH (ref <5.0)
VLDL: 25 mg/dL (ref <30)

## 2016-11-05 MED ORDER — INSULIN PEN NEEDLE 31G X 5 MM MISC: 5 refills | Status: DC

## 2016-11-05 MED ORDER — INSULIN NPH ISOPHANE & REGULAR (70-30) 100 UNIT/ML ~~LOC~~ SUSP: SUBCUTANEOUS | 1 refills | Status: DC

## 2016-11-05 MED ORDER — LIRAGLUTIDE 18 MG/3ML ~~LOC~~ SOPN: 1.2000 mg | PEN_INJECTOR | Freq: Every day | SUBCUTANEOUS | 3 refills | Status: DC

## 2016-11-05 NOTE — Progress Notes (Signed)
Subjective:    Patient ID: Linda Briggs, female    DOB: 27-Aug-1956, 61 y.o.   MRN: [id number]  Medication Refill   Back Pain     05/29/15 HgA1c in June was 6.6 but LDL was 108.  Overall, I was very happy with that.  Patient has worked hard on her diet as well as exercise and has lost 7 pounds since her last office visit. Unfortunately she is seen her sugars start to steadily increased and she is frequently seen sugars are around 200. She attributes this to stress as she is having to care for her mother who has Lewy body dementia. She denies eating more carbohydrates. She denies any recent infections. She denies any chest pain shortness of breath or dyspnea on exertion.  At that time, my plan was: Unfortunately the patient seems to be losing control of her diabetes. I believe this is likely due to the fact the glipizide has lost its effectiveness. Patient is unable to afford SGLT-1, SGLT-2, or GLP inhibitor medications.  Therefore her only option would be to increase her insulin or use Actos as a insulin sensitizer. She has significant edema in her legs. However she would like to try Actos versus increasing her insulin and monitor for worsening edema. Therefore I will check a hemoglobin A1c. Hemoglobin A1c is greater than 8, I would add Actos 30 mg by mouth daily. Her blood pressure is acceptable to I will also check a fasting lipid panel. Goal LDL cholesterol is less than 100. Patient received her flu shot today. Pneumovax 23 is up-to-date  09/18/15 Fortunately since starting Actos, the patient has only gained 1 pound. Furthermore there is no significant edema in her legs. However she ran out of insulin for one month. Therefore she is only been on Actos and insulin together for the last 4 weeks and even so, her HgA1c is 7.4. On this combination, she reports fasting blood sugars under 100, 2 hour postprandial sugars under 180 which for this patient is excellent. Her blood pressure is also elevated  but she ran out of metoprolol. She denies any chest pain shortness of breath or dyspnea on exertion. She is still not exercising. She is watching her diet.  At that time, my plan was: Continue novolin 70/30 70 units in the morning and 30 units in the evening. The patient would like to continue Actos as an insulin sensitizer. We will need to watch closely for weight gain and peripheral edema. I believe her blood pressure improved when she resumes the metoprolol. Cholesterol is acceptable. I have strongly encouraged patient to try to exercise including water aerobics which I believe she can perform even with her body habitus without exacerbating her joint problems in her knees and in her back. She agrees and she states that she will try to start performing this.  01/01/16 Apparently, the patient is responding well to Actos as an insulin sensitizer. It took a little longer than I expected however her insulin requirement has dropped dramatically. When I last saw the patient, she was taking insulin 7030, 70 units in the morning, and 30 units in the evening. She has gradually weaned herself all the way down to just 50 units in the morning and she continues to have occasional episodes of hypoglycemia with her blood sugar in the 70s and sometimes as low as 50. Her weight has gone up 4 pounds since her last office visit and she does believe that it is fluid retention as that her  legs have swollen and become tighter however she is willing to live with this as her sugars have responded dramatically and she is on much less insulin. She has decreased her total insulin consumption from 140 units down to 50 units a day since we started Actos. Her eye exam is up-to-date and was recently checked in March. 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 Unfortunately she continues to suffer from neuropathy in her legs reporting burning and stinging pain on the plantar aspects  of both feet. She also has complete and total numbness on the plantar aspects of both feet. She cannot feel me touch her feet at all.  At that time, my plan was: Her blood pressure today is acceptable and I will make no changes in her medication at this time. I will check a fasting lipid panel and her goal LDL cholesterol is less than 100. Diabetic eye exam and diabetic foot exam are up-to-date. I recommended that the patient examined the plantar surfaces of both feet using a mirror every night given the fact that she has no feeling in her feet so that if she were to develop a sore or wound we can begin to treat it aggressively. Today there is no evidence of this on exam. She does have chronic venous stasis changes and lymphedema in both legs. Blood sugars have dropped dramatically. I have recommended that we spread the insulin dosage out throughout the day to try to lessen hypoglycemic episodes. Since her total insulin requirement today is 50 units, I have recommended she use 35 units in the morning and 15 units in the afternoon. She finally consents to allow me to schedule her for a mammogram. She continues to refuse a colonoscopy. We did discuss cologuard.  04/08/16 Patient continues to do remarkably well. She is on Novolin 70/30. She is taking 30 units in the morning. She frequently does not take her insulin at night because she is experiencing hypoglycemia in the mornings. She seems to be requiring less and less insulin ever since we started her on Actos. She denies any polyuria, polydipsia, or blurred vision. Unfortunately she is experiencing worsening lymphedema in her legs on Actos and some weight gain. She is currently taking Lasix 20 mg a day. She denies any chest pain shortness of breath dyspnea on exertion or orthopnea. She is due for fasting lab work.    07/22/16 She is here today for follow-up. Her last hemoglobin A1c was remarkable at 5.8. We discontinued some of her medication. She states that  her fasting blood sugars between 100-130. She states that her two-hour postprandial sugars are typically less than 160. Unfortunately since last time I saw her she is developed severe right-sided low back pain with right-sided sciatica radiating from her hip into her right foot. She has persistent neuropathy chronically and therefore she denies any new numbness or tingling in her foot. She denies any chest pain shortness of breath or dyspnea on exertion. AT that time, my plan was: Patient is having severe right-sided sciatica. Begin prednisone taper pack. Also use tramadol 50 mg every 8 hours when necessary pain. Check hemoglobin A1c. Goal hemoglobin A1c is less than 6.5. Blood pressure is acceptable. I will check a fasting lipid panel. Goal LDL cholesterol is less than 100  11/05/16  Patient is not taking insulin the way written on her list. She is on 70/30, 20 units only in the morning. She is not taking any insulin  at night. She denies any hypoglycemia. She states that her sugars are between 100-150. Diabetic eye exam is up-to-date. Diabetic foot exam is performed today and is mitigated for thick hyperkeratotic skin on both legs distal to the knee, chronic venous stasis changes, hard and firm indurated skin, and no sensation to 10 g monofilament in either feet. She does have palpable pulses at the dorsalis pedis and posterior tibialis bilaterally. She is not exercising. She stays confined to wheelchair the majority of the day due to chronic low back pain and right-sided sciatica. She is getting no exercise whatsoever. She denies any chest pain shortness of breath or dyspnea on exertion although she is extremely sedentary. She is due for fasting lab work Past Medical History:  Diagnosis Date  . Arthritis    feet & toes  . Bulging disc   . Diabetes mellitus   . History of hepatitis B   . Hyperlipidemia   . Hypertension   . Neuromuscular disorder ([place])    in feet and fingers   . Neuropathy (HCC)     feet  . 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 Peripheral vascular disease ([place])   . Shortness of breath dyspnea    on lopressor   . Sleep apnea    pt had study done but it was incomplete due to blood sugar issues    Past Surgical History:  Procedure Laterality Date  . HYSTEROSCOPY W/D&C N/A 02/15/2015   Procedure: DILATATION AND CURETTAGE /HYSTEROSCOPY;  Surgeon: Linda Hedges, DO;  Location: [facility];  Service: Gynecology;  Laterality: N/A;  . TONSILLECTOMY    . tyroid biospy     january 2016   Current Outpatient Prescriptions on File Prior to Visit  Medication Sig Dispense Refill  . ACCU-CHEK SOFTCLIX LANCETS lancets Use to monitor FSBS 3x daily for fluctuating FSBS. DX: 250.00 100 each 12  . Alcohol Swabs (B-D SINGLE USE SWABS REGULAR) PADS Use to monitor FSBS 3x daily for fluctuating FSBS. DX: 250.00 300 each 1  . aspirin 325 MG tablet Take 325 mg by mouth daily.    . Bilberry 1000 MG CAPS Take 1 capsule by mouth daily. Reported on 09/18/2015    . Blood Glucose Calibration (ACCU-CHEK AVIVA) SOLN Use to monitor FSBS 3x daily for fluctuating FSBS. DX: 250.00 3 each 3  . Blood Glucose Monitoring Suppl (ACCU-CHEK AVIVA PLUS) W/DEVICE KIT Use to monitor FSBS 3x daily for fluctuating FSBS. DX: 250.00 1 kit 0  . calcium carbonate (TUMS - DOSED IN MG ELEMENTAL CALCIUM) 500 MG chewable tablet Chew 1 tablet by mouth as needed for indigestion or heartburn. Reported on 09/18/2015    . Carboxymethylcellulose Sodium (LUBRICANT EYE DROPS OP) Apply 1 drop to eye as needed (dry eyes or allergies).     . CHROMIUM-CINNAMON PO Take 1 capsule by mouth daily.    . Cyanocobalamin (VITAMIN B 12 PO) Take 1,000 mcg by mouth daily.     . furosemide (LASIX) 20 MG tablet Take 1 tablet (20 mg total) by mouth daily. 90 tablet 3  . gabapentin (NEURONTIN) 300 MG capsule TAKE 1 CAPSULE (300 MG TOTAL) BY MOUTH 3 (THREE) TIMES DAILY. 270 capsule 1  . glipiZIDE (GLUCOTROL XL) 10 MG 24 hr tablet Take 1 tablet (10 mg  total) by mouth daily with breakfast. 90 tablet 3  . glucose blood (ACCU-CHEK AVIVA PLUS) test strip USE TO MONITOR FINGERSTICK BLOOD SUGAR 3x DAILY FOR FLUCTUATING FINGERSTICK BLOOD SUGAR: Dx: E11.65 300 each 12  .  ibuprofen (ADVIL,MOTRIN) 800 MG tablet Take 1 tablet (800 mg total) by mouth every 8 (eight) hours as needed. 30 tablet 0  . lisinopril (PRINIVIL,ZESTRIL) 40 MG tablet Take 1 tablet (40 mg total) by mouth daily. 90 tablet 3  . medroxyPROGESTERone (PROVERA) 10 MG tablet 2 tablets daily.    . metFORMIN (GLUCOPHAGE) 1000 MG tablet TAKE 1 TABLET (1,000 MG TOTAL) BY MOUTH 2 (TWO) TIMES DAILY WITH A MEAL. 180 tablet 3  . metoprolol tartrate (LOPRESSOR) 25 MG tablet Take 1 tablet (25 mg total) by mouth 2 (two) times daily. 180 tablet 3  . NOVOLIN 70/30 (70-30) 100 UNIT/ML injection INJECT 70 UNITS INTO THE SKIN IN THE MORNING, THEN 30 UNITS INTO THE SKIN AT BEDTIME 90 mL 1  . ondansetron (ZOFRAN) 4 MG tablet TAKE 1 TABLET EVERY 6 HOURS AS NEEDED FOR NAUSEA AND VOMITING 60 tablet 0  . Phenyleph-Doxylamine-DM-APAP (ALKA SELTZER PLUS PO) Take 1 tablet by mouth daily as needed (stomach pain).    . pioglitazone (ACTOS) 30 MG tablet TAKE 1 TABLET (30 MG TOTAL) BY MOUTH DAILY. 90 tablet 1  . potassium chloride (K-DUR) 10 MEQ tablet Take 1 tablet (10 mEq total) by mouth daily. 90 tablet 3  . pravastatin (PRAVACHOL) 40 MG tablet Take 1 tablet (40 mg total) by mouth daily. 90 tablet 3  . predniSONE (DELTASONE) 20 MG tablet 3 tabs poqday 1-2, 2 tabs poqday 3-4, 1 tab poqday 5-6 12 tablet 0  . SPIRULINA PO Take 1 capsule by mouth daily.    . traMADol (ULTRAM) 50 MG tablet Take 1 tablet (50 mg total) by mouth every 6 (six) hours as needed. 60 tablet 2  . VITAMIN D, CHOLECALCIFEROL, PO Take 1,000 Units by mouth daily.     . vitamin E 400 UNIT capsule Take 400 Units by mouth daily.     No current facility-administered medications on file prior to visit.    Allergies  Allergen Reactions  . Codeine  Anaphylaxis and Hives  . Penicillins Anaphylaxis and Hives  . Food Nausea And Vomiting    Olives   . Januvia [Sitagliptin]     Stomach cramps  . Latex Hives   Social History   Social History  . Marital status: Single    Spouse name: N/A  . Number of children: N/A  . Years of education: N/A   Occupational History  . Not on file.   Social History Main Topics  . Smoking status: Never Smoker  . Smokeless tobacco: Never Used  . Alcohol use 0.0 oz/week     Comment: social drinker.  . Drug use: No  . Sexual activity: Not on file   Other Topics Concern  . Not on file   Social History Narrative  . No narrative on file   Family History  Problem Relation Age of Onset  . Hyperlipidemia Mother   . Heart disease Father   . Hyperlipidemia Father   . Hypertension Father   . Heart disease Brother   . Stroke Brother       Review of Systems  Musculoskeletal: Positive for back pain.  All other systems reviewed and are negative.      Objective:   Physical Exam  Constitutional: She is oriented to person, place, and time. She appears well-developed and well-nourished. No distress.  HENT:  Head: Normocephalic and atraumatic.  Right Ear: External ear normal.  Left Ear: External ear normal.  Nose: Nose normal.  Mouth/Throat: Oropharynx is clear and moist. No oropharyngeal exudate.  Eyes: Conjunctivae and EOM are normal. Pupils are equal, round, and reactive to light. Right eye exhibits no discharge. Left eye exhibits no discharge. No scleral icterus.  Neck: Normal range of motion. Neck supple. No JVD present. No tracheal deviation present. No thyromegaly present.  Cardiovascular: Normal rate, regular rhythm, normal heart sounds and intact distal pulses.  Exam reveals no gallop and no friction rub.   No murmur heard. Pulmonary/Chest: Effort normal and breath sounds normal. No stridor. No respiratory distress. She has no wheezes. She has no rales. She exhibits no tenderness.    Abdominal: Soft. Bowel sounds are normal. She exhibits no distension and no mass. There is no tenderness. There is no rebound and no guarding.  Musculoskeletal: Normal range of motion. She exhibits no edema or tenderness.  Lymphadenopathy:    She has no cervical adenopathy.  Neurological: She is alert and oriented to person, place, and time. She has normal reflexes. No cranial nerve deficit. She exhibits normal muscle tone. Coordination normal.  Skin: Skin is warm. No rash noted. She is not diaphoretic. No erythema. No pallor.  Psychiatric: She has a normal mood and affect. Her behavior is normal. Judgment and thought content normal.  Vitals reviewed.         Assessment & Plan:  Essential hypertension  Type 2 diabetes mellitus with complication, with long-term current use of insulin (HCC) - Plan: CBC with Differential/Platelet, COMPLETE METABOLIC PANEL WITH GFR, Lipid panel, Hemoglobin A1c, Microalbumin, urine  Pure hypercholesterolemia  Multinodular goiter - Plan: US THYROID  Patient has a history of a multinodular goiter. Her last ultrasound of her thyroid was done more than 2 years ago. She is due to recheck this. Initial biopsies were negative. Her blood pressure today is borderline but her blood pressure at home is well controlled between 110 and 120/80. I will check a fasting lipid panel today. Her goal LDL cholesterol is less than 100. I will also check a hemoglobin A1c as well as a urine microalbumin. I had a 30 minute discussion with the patient about increasing her physical activity. If she does not make an attempt, her 5 year life expectancy is extremely low as is her morbidity. I recommended going to a pool and walking in the pool 5 days a week and gradually increasing her exercise until she is able to walk for 30 minutes a day. I would then like her to try to start walking out of the pool. This discontinue Actos to decrease the swelling in her legs and replaced with Victoza to  try to improve her ability to lose weight 1.2 mg sq daily.

## 2016-11-06 LAB — HEMOGLOBIN A1C
Hgb A1c MFr Bld: 5.9 % — ABNORMAL HIGH (ref <5.7)
MEAN PLASMA GLUCOSE: 123 mg/dL

## 2016-11-06 LAB — MICROALBUMIN, URINE: MICROALB UR: 2 mg/dL

## 2016-11-07 ENCOUNTER — Ambulatory Visit
Admission: RE | Admit: 2016-11-07 | Discharge: 2016-11-07 | Disposition: A | Discharge status: Home / Self Care | Payer: Medicare HMO | Source: Ambulatory Visit | Attending: Family Medicine | Admitting: Family Medicine

## 2016-11-07 ENCOUNTER — Telehealth: Payer: Self-pay | Admitting: Family Medicine

## 2016-11-07 DIAGNOSIS — E042 Nontoxic multinodular goiter: Secondary | ICD-10-CM

## 2016-11-07 NOTE — Telephone Encounter (Signed)
Pt states that since starting the victoza at 1.6 she had a 70 bs yesterday and woke up hot this morning with a bs of 72 and she ate a doughnut and went back to bed and then this am her fbs was 110. She did not take her metformin last night and wants to know if she should go to .6 of the victoza.  Per [facility] pt should stop the Metformin and continue Victoza at 1.2. Pt aware and verbally understands. Med list updated.

## 2016-11-12 ENCOUNTER — Other Ambulatory Visit: Payer: Self-pay | Admitting: Family Medicine

## 2016-12-23 ENCOUNTER — Telehealth: Payer: Self-pay | Admitting: Family Medicine

## 2016-12-23 ENCOUNTER — Other Ambulatory Visit: Payer: Self-pay | Admitting: Family Medicine

## 2016-12-23 DIAGNOSIS — R002 Palpitations: Secondary | ICD-10-CM

## 2016-12-23 DIAGNOSIS — I4901 Ventricular fibrillation: Secondary | ICD-10-CM

## 2016-12-23 DIAGNOSIS — I159 Secondary hypertension, unspecified: Secondary | ICD-10-CM

## 2016-12-23 NOTE — Telephone Encounter (Signed)
Says has [employer] and needs referral for yearly follow up with Cardiologist.  Told no referral needed if in network.  [employer] called and this was verified.

## 2017-01-11 DIAGNOSIS — E7800 Pure hypercholesterolemia, unspecified: Secondary | ICD-10-CM | POA: Insufficient documentation

## 2017-01-11 DIAGNOSIS — R600 Localized edema: Secondary | ICD-10-CM | POA: Insufficient documentation

## 2017-01-11 NOTE — Progress Notes (Signed)
Cardiology Office Note  Date:  01/13/2017   ID:  WESLYNN KE, DOB 06/09/1956, MRN [id number]  PCP:  Susy Frizzle, MD   Chief Complaint  Patient presents with  . other    OD f/u LS 2016. Pt c/o feeling lightheaded at times, nausea, cp at times, fluttering sensation at times, sob.  Reviewed meds with pt verbally.    HPI:   Ms. Wellnitz is a very pleasant 61 year old woman with  diabetes,  on insulin.  hemoglobin A1c  5.9  Episodes of sinus tachycardia morbid obesity,  hyperlipidemia,  hypertension,  chronic lower extremity swelling,   dizziness.   Neuropathy in her feet Mom with dementia, lives with her She presents for routine followup of her hypertension,  leg swelling, sinus tachycardia  In follow-up today she is taking Lisinopril 20 Reports that BP low at home Getting dizzy at home  Was on actos, got worsening leg swelling, medication was stopped Now on victoza Sugars low, Stopped metformin 1000 BID Wonders if sugars are starting to creep back up  Waxing and waning diarrhea/constipation Spider bite  Rare tachycardia usually in the setting of extreme stress when taking care of her mother chronic shortness of breath with exertion.  Legs are weak, walks with a cane. No regular exercise program  carotid ultrasound showing minimal disease. Thyroid nodules that have been evaluated She takes Lasix daily. Leg edema has been stable  EKG personally reviewed by myself on todays visit Shows normal sinus rhythm rate 81 bpm no significant ST-T wave changes  Other past medical history reviewed  Holter monitor in the past that showed elevated baseline heart rate with frequent periods of sinus tachycardia. Improvement in dizzy episodes and fluttering with metoprolol.   PMH:   has a past medical history of Arthritis; Bulging disc; Diabetes mellitus; History of hepatitis B; Hyperlipidemia; Hypertension; Neuromuscular disorder ([place]); Neuropathy; Periodic heart flutter;  Peripheral vascular disease ([place]); Shortness of breath dyspnea; and Sleep apnea.  PSH:    Past Surgical History:  Procedure Laterality Date  . HYSTEROSCOPY W/D&C N/A 02/15/2015   Procedure: DILATATION AND CURETTAGE /HYSTEROSCOPY;  Surgeon: Linda Hedges, DO;  Location: [facility];  Service: Gynecology;  Laterality: N/A;  . TONSILLECTOMY    . tyroid biospy     january 2016    Current Outpatient Prescriptions  Medication Sig Dispense Refill  . ACCU-CHEK AVIVA PLUS test strip CHECK FINGERSTICK BLOOD SUGAR 3 TIMES DAILY FOR FLUCTUATING BLOOD SUGAR 300 each 12  . ACCU-CHEK SOFTCLIX LANCETS lancets Use to monitor FSBS 3x daily for fluctuating FSBS. DX: 250.00 100 each 12  . Alcohol Swabs (B-D SINGLE USE SWABS REGULAR) PADS Use to monitor FSBS 3x daily for fluctuating FSBS. DX: 250.00 300 each 1  . aspirin 325 MG tablet Take 325 mg by mouth daily.    . Blood Glucose Calibration (ACCU-CHEK AVIVA) SOLN Use to monitor FSBS 3x daily for fluctuating FSBS. DX: 250.00 3 each 3  . Blood Glucose Monitoring Suppl (ACCU-CHEK AVIVA PLUS) W/DEVICE KIT Use to monitor FSBS 3x daily for fluctuating FSBS. DX: 250.00 1 kit 0  . Carboxymethylcellulose Sodium (LUBRICANT EYE DROPS OP) Apply 1 drop to eye as needed (dry eyes or allergies).     . furosemide (LASIX) 20 MG tablet Take 1 tablet (20 mg total) by mouth daily. 90 tablet 3  . gabapentin (NEURONTIN) 300 MG capsule TAKE 1 CAPSULE (300 MG TOTAL) BY MOUTH 3 (THREE) TIMES DAILY. 270 capsule 1  . glipiZIDE (GLUCOTROL XL) 10 MG 24  hr tablet Take 1 tablet (10 mg total) by mouth daily with breakfast. 90 tablet 3  . ibuprofen (ADVIL,MOTRIN) 800 MG tablet Take 1 tablet (800 mg total) by mouth every 8 (eight) hours as needed. 30 tablet 0  . insulin NPH-regular Human (NOVOLIN 70/30) (70-30) 100 UNIT/ML injection Only taking 20 units in the morning.  Not taking any at night. 90 mL 1  . Insulin Pen Needle 31G X 5 MM MISC Use Daily with Victoza 100 each 5  . liraglutide 18 MG/3ML  SOPN Inject 0.2 mLs (1.2 mg total) into the skin daily. 2 pen 3  . lisinopril (PRINIVIL,ZESTRIL) 20 MG tablet TAKE 1 TABLET (20 MG TOTAL) BY MOUTH DAILY. 90 tablet 3  . metoprolol tartrate (LOPRESSOR) 25 MG tablet Take 1 tablet (25 mg total) by mouth 2 (two) times daily. 180 tablet 3  . ondansetron (ZOFRAN) 4 MG tablet TAKE 1 TABLET EVERY 6 HOURS AS NEEDED FOR NAUSEA AND VOMITING 60 tablet 0  . Phenyleph-Doxylamine-DM-APAP (ALKA SELTZER PLUS PO) Take 1 tablet by mouth daily as needed (stomach pain).    . potassium chloride (K-DUR) 10 MEQ tablet Take 1 tablet (10 mEq total) by mouth daily. 90 tablet 3  . pravastatin (PRAVACHOL) 40 MG tablet Take 1 tablet (40 mg total) by mouth daily. 90 tablet 3  . SPIRULINA PO Take 1 capsule by mouth daily.    . traMADol (ULTRAM) 50 MG tablet Take 1 tablet (50 mg total) by mouth every 6 (six) hours as needed. 60 tablet 2  . VITAMIN D, CHOLECALCIFEROL, PO Take 1,000 Units by mouth daily.     . vitamin E 400 UNIT capsule Take 400 Units by mouth daily.     No current facility-administered medications for this visit.      Allergies:   Codeine; Penicillins; Food; Januvia [sitagliptin]; and Latex   Social History:  The patient  reports that she has never smoked. She has never used smokeless tobacco. She reports that she does not drink alcohol or use drugs.   Family History:   family history includes Heart disease in her brother and father; Hyperlipidemia in her father and mother; Hypertension in her father; Stroke in her brother.    Review of Systems: Review of Systems  Constitutional: Negative.   Respiratory: Positive for shortness of breath.   Cardiovascular: Positive for palpitations and leg swelling.  Gastrointestinal: Negative.   Musculoskeletal: Negative.   Neurological: Negative.   Psychiatric/Behavioral: Negative.   All other systems reviewed and are negative.    PHYSICAL EXAM: VS:  BP 106/72 (BP Location: Left Arm, Patient Position: Sitting,  Cuff Size: Large)   Pulse 81   Ht _0  (1.676 m)   Wt (!) 311 lb 12 oz (141.4 kg)   BMI 50.32 kg/m  , BMI Body mass index is 50.32 kg/m.  GEN: Well nourished, well developed, in no acute distress , Obese HEENT: normal  Neck: no JVD, carotid bruits, or masses Cardiac: RRR; no murmurs, rubs, or gallops, signs of chronic venous stasis lower extremities below the knees bilaterally Respiratory:  clear to auscultation bilaterally, normal work of breathing GI: soft, nontender, nondistended, + BS MS: no deformity or atrophy  Skin: warm and dry, no rash Neuro:  Strength and sensation are intact Psych: euthymic mood, full affect    Recent Labs: 11/05/2016: ALT 13; BUN 17; Creat 0.96; Hemoglobin 13.3; Platelets 192; Potassium 4.5; Sodium 139    Lipid Panel Lab Results  Component Value Date   CHOL 150  11/05/2016   HDL 27 (L) 11/05/2016   LDLCALC 98 11/05/2016   TRIG 126 11/05/2016      Wt Readings from Last 3 Encounters:  01/13/17 (!) 311 lb 12 oz (141.4 kg)  11/05/16 (!) 322 lb (146.1 kg)  07/22/16 (!) 332 lb (150.6 kg)       ASSESSMENT AND PLAN:  Shortness of breath - Plan: EKG 12-Lead Recommended weight loss, regular exercise program  Type 2 diabetes mellitus with complication, with long-term current use of insulin (HCC) - Plan: EKG 12-Lead Diabetes numbers much improved Not eating much food, doing well on Victoza  Palpitations - Plan: EKG 12-Lead Sinus tachycardia episodes relatively well-controlled If she does have breakthrough episodes, she could take extra metoprolol Usually comes on with stress associated with her mother  Essential hypertension - Plan: EKG 12-Lead Blood pressure running low, recommended she consider decreasing lisinopril down to 10 mg daily  Pure hypercholesterolemia - Plan: EKG 12-Lead Cholesterol is at goal on the current lipid regimen. No changes to the medications were made.  Localized edema - Plan: EKG 12-Lead Chronic venous  stasis Recommended Ace wraps for worsening swelling Unable to wear compression hose  Morbid obesity (HCC) Continue aggressive local carbohydrate diet Walking program  Disposition:   F/U  12 months   Total encounter time more than 25 minutes  Greater than 50% was spent in counseling and coordination of care with the patient    Orders Placed This Encounter  Procedures  . EKG 12-Lead     Signed, Esmond Plants, M.D., Ph.D. 01/13/2017  [place], [place]

## 2017-01-13 ENCOUNTER — Encounter: Payer: Self-pay | Admitting: Cardiovascular Disease

## 2017-01-13 ENCOUNTER — Ambulatory Visit (INDEPENDENT_AMBULATORY_CARE_PROVIDER_SITE_OTHER): Payer: Medicare HMO | Admitting: Cardiovascular Disease

## 2017-01-13 VITALS — BP 106/72 | HR 81 | Ht 66.0 in | Wt 311.8 lb

## 2017-01-13 DIAGNOSIS — R0602 Shortness of breath: Secondary | ICD-10-CM

## 2017-01-13 DIAGNOSIS — I10 Essential (primary) hypertension: Secondary | ICD-10-CM | POA: Diagnosis not present

## 2017-01-13 DIAGNOSIS — R600 Localized edema: Secondary | ICD-10-CM

## 2017-01-13 DIAGNOSIS — E118 Type 2 diabetes mellitus with unspecified complications: Secondary | ICD-10-CM | POA: Diagnosis not present

## 2017-01-13 DIAGNOSIS — Z794 Long term (current) use of insulin: Secondary | ICD-10-CM

## 2017-01-13 DIAGNOSIS — E7800 Pure hypercholesterolemia, unspecified: Secondary | ICD-10-CM

## 2017-01-13 DIAGNOSIS — R002 Palpitations: Secondary | ICD-10-CM | POA: Diagnosis not present

## 2017-01-13 NOTE — Patient Instructions (Addendum)
Medication Instructions:   No medication changes made  If blood pressure continues to run low, Cut the lisinopril down to 10 mg daily  Labwork:  No new labs needed  Testing/Procedures:  No further testing at this time   I recommend watching educational videos on topics of interest to you at:       [email or web address]  Enter code: [email or web address]    Follow-Up: It was a pleasure seeing you in the office today. Please call us if you have new issues that need to be addressed before your next appt.  [phone]  Your physician wants you to follow-up in: 12 months.  You will receive a reminder letter in the mail two months in advance. If you don't receive a letter, please call our office to schedule the follow-up appointment.  If you need a refill on your cardiac medications before your next appointment, please call your pharmacy.

## 2017-02-10 ENCOUNTER — Encounter: Payer: Self-pay | Admitting: Family Medicine

## 2017-02-10 ENCOUNTER — Ambulatory Visit (INDEPENDENT_AMBULATORY_CARE_PROVIDER_SITE_OTHER): Payer: Medicare HMO | Admitting: Family Medicine

## 2017-02-10 VITALS — BP 130/74 | HR 82 | Temp 98.3°F | Resp 20 | Ht 66.0 in | Wt 308.0 lb

## 2017-02-10 DIAGNOSIS — E7800 Pure hypercholesterolemia, unspecified: Secondary | ICD-10-CM | POA: Diagnosis not present

## 2017-02-10 DIAGNOSIS — E118 Type 2 diabetes mellitus with unspecified complications: Secondary | ICD-10-CM | POA: Diagnosis not present

## 2017-02-10 DIAGNOSIS — I10 Essential (primary) hypertension: Secondary | ICD-10-CM | POA: Diagnosis not present

## 2017-02-10 DIAGNOSIS — Z794 Long term (current) use of insulin: Secondary | ICD-10-CM | POA: Diagnosis not present

## 2017-02-10 LAB — CBC WITH DIFFERENTIAL/PLATELET
BASOS ABS: 0 {cells}/uL (ref 0–200)
BASOS PCT: 0 %
EOS ABS: 100 {cells}/uL (ref 15–500)
Eosinophils Relative: 1 %
HEMATOCRIT: 40.1 % (ref 35.0–45.0)
HEMOGLOBIN: 13 g/dL (ref 12.0–15.0)
LYMPHS ABS: 2300 {cells}/uL (ref 850–3900)
Lymphocytes Relative: 23 %
MCH: 29.5 pg (ref 27.0–33.0)
MCHC: 32.4 g/dL (ref 32.0–36.0)
MCV: 91.1 fL (ref 80.0–100.0)
MONO ABS: 500 {cells}/uL (ref 200–950)
MONOS PCT: 5 %
MPV: 10.2 fL (ref 7.5–12.5)
Neutro Abs: 7100 cells/uL (ref 1500–7800)
Neutrophils Relative %: 71 %
PLATELETS: 180 10*3/uL (ref 140–400)
RBC: 4.4 MIL/uL (ref 3.80–5.10)
RDW: 14 % (ref 11.0–15.0)
WBC: 10 10*3/uL (ref 3.8–10.8)

## 2017-02-10 LAB — COMPREHENSIVE METABOLIC PANEL
ALBUMIN: 3.9 g/dL (ref 3.6–5.1)
ALK PHOS: 69 U/L (ref 33–130)
ALT: 14 U/L (ref 6–29)
AST: 17 U/L (ref 10–35)
BUN: 14 mg/dL (ref 7–25)
CO2: 27 mmol/L (ref 20–31)
CREATININE: 0.94 mg/dL (ref 0.50–1.05)
Calcium: 8.7 mg/dL (ref 8.6–10.4)
Chloride: 100 mmol/L (ref 98–110)
Glucose, Bld: 112 mg/dL — ABNORMAL HIGH (ref 70–99)
POTASSIUM: 4.3 mmol/L (ref 3.5–5.3)
Sodium: 139 mmol/L (ref 135–146)
TOTAL PROTEIN: 6.7 g/dL (ref 6.1–8.1)
Total Bilirubin: 0.7 mg/dL (ref 0.2–1.2)

## 2017-02-10 LAB — LIPID PANEL
Cholesterol: 121 mg/dL (ref <200)
HDL: 23 mg/dL — ABNORMAL LOW (ref >50)
LDL CALC: 74 mg/dL (ref <100)
TRIGLYCERIDES: 121 mg/dL (ref <150)
Total CHOL/HDL Ratio: 5.3 Ratio — ABNORMAL HIGH (ref <5.0)
VLDL: 24 mg/dL (ref <30)

## 2017-02-10 MED ORDER — LISINOPRIL 5 MG PO TABS: 5.0000 mg | ORAL_TABLET | Freq: Every day | ORAL | 3 refills | Status: DC

## 2017-02-10 NOTE — Addendum Note (Signed)
Addended by: Legrand RamsWILLIS, SANDY B on: 02/10/2017 12:13 PM   Modules accepted: Orders

## 2017-02-10 NOTE — Progress Notes (Signed)
Subjective:    Patient ID: Linda Briggs, female    DOB: 27-Aug-1956, 61 y.o.   MRN: [id number]  Medication Refill   Back Pain     05/29/15 HgA1c in June was 6.6 but LDL was 108.  Overall, I was very happy with that.  Patient has worked hard on her diet as well as exercise and has lost 7 pounds since her last office visit. Unfortunately she is seen her sugars start to steadily increased and she is frequently seen sugars are around 200. She attributes this to stress as she is having to care for her mother who has Lewy body dementia. She denies eating more carbohydrates. She denies any recent infections. She denies any chest pain shortness of breath or dyspnea on exertion.  At that time, my plan was: Unfortunately the patient seems to be losing control of her diabetes. I believe this is likely due to the fact the glipizide has lost its effectiveness. Patient is unable to afford SGLT-1, SGLT-2, or GLP inhibitor medications.  Therefore her only option would be to increase her insulin or use Actos as a insulin sensitizer. She has significant edema in her legs. However she would like to try Actos versus increasing her insulin and monitor for worsening edema. Therefore I will check a hemoglobin A1c. Hemoglobin A1c is greater than 8, I would add Actos 30 mg by mouth daily. Her blood pressure is acceptable to I will also check a fasting lipid panel. Goal LDL cholesterol is less than 100. Patient received her flu shot today. Pneumovax 23 is up-to-date  09/18/15 Fortunately since starting Actos, the patient has only gained 1 pound. Furthermore there is no significant edema in her legs. However she ran out of insulin for one month. Therefore she is only been on Actos and insulin together for the last 4 weeks and even so, her HgA1c is 7.4. On this combination, she reports fasting blood sugars under 100, 2 hour postprandial sugars under 180 which for this patient is excellent. Her blood pressure is also elevated  but she ran out of metoprolol. She denies any chest pain shortness of breath or dyspnea on exertion. She is still not exercising. She is watching her diet.  At that time, my plan was: Continue novolin 70/30 70 units in the morning and 30 units in the evening. The patient would like to continue Actos as an insulin sensitizer. We will need to watch closely for weight gain and peripheral edema. I believe her blood pressure improved when she resumes the metoprolol. Cholesterol is acceptable. I have strongly encouraged patient to try to exercise including water aerobics which I believe she can perform even with her body habitus without exacerbating her joint problems in her knees and in her back. She agrees and she states that she will try to start performing this.  01/01/16 Apparently, the patient is responding well to Actos as an insulin sensitizer. It took a little longer than I expected however her insulin requirement has dropped dramatically. When I last saw the patient, she was taking insulin 7030, 70 units in the morning, and 30 units in the evening. She has gradually weaned herself all the way down to just 50 units in the morning and she continues to have occasional episodes of hypoglycemia with her blood sugar in the 70s and sometimes as low as 50. Her weight has gone up 4 pounds since her last office visit and she does believe that it is fluid retention as that her  legs have swollen and become tighter however she is willing to live with this as her sugars have responded dramatically and she is on much less insulin. She has decreased her total insulin consumption from 140 units down to 50 units a day since we started Actos. Her eye exam is up-to-date and was recently checked in March. 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 Unfortunately she continues to suffer from neuropathy in her legs reporting burning and stinging pain on the plantar aspects  of both feet. She also has complete and total numbness on the plantar aspects of both feet. She cannot feel me touch her feet at all.  At that time, my plan was: Her blood pressure today is acceptable and I will make no changes in her medication at this time. I will check a fasting lipid panel and her goal LDL cholesterol is less than 100. Diabetic eye exam and diabetic foot exam are up-to-date. I recommended that the patient examined the plantar surfaces of both feet using a mirror every night given the fact that she has no feeling in her feet so that if she were to develop a sore or wound we can begin to treat it aggressively. Today there is no evidence of this on exam. She does have chronic venous stasis changes and lymphedema in both legs. Blood sugars have dropped dramatically. I have recommended that we spread the insulin dosage out throughout the day to try to lessen hypoglycemic episodes. Since her total insulin requirement today is 50 units, I have recommended she use 35 units in the morning and 15 units in the afternoon. She finally consents to allow me to schedule her for a mammogram. She continues to refuse a colonoscopy. We did discuss cologuard.  04/08/16 Patient continues to do remarkably well. She is on Novolin 70/30. She is taking 30 units in the morning. She frequently does not take her insulin at night because she is experiencing hypoglycemia in the mornings. She seems to be requiring less and less insulin ever since we started her on Actos. She denies any polyuria, polydipsia, or blurred vision. Unfortunately she is experiencing worsening lymphedema in her legs on Actos and some weight gain. She is currently taking Lasix 20 mg a day. She denies any chest pain shortness of breath dyspnea on exertion or orthopnea. She is due for fasting lab work.    07/22/16 She is here today for follow-up. Her last hemoglobin A1c was remarkable at 5.8. We discontinued some of her medication. She states that  her fasting blood sugars between 100-130. She states that her two-hour postprandial sugars are typically less than 160. Unfortunately since last time I saw her she is developed severe right-sided low back pain with right-sided sciatica radiating from her hip into her right foot. She has persistent neuropathy chronically and therefore she denies any new numbness or tingling in her foot. She denies any chest pain shortness of breath or dyspnea on exertion. AT that time, my plan was: Patient is having severe right-sided sciatica. Begin prednisone taper pack. Also use tramadol 50 mg every 8 hours when necessary pain. Check hemoglobin A1c. Goal hemoglobin A1c is less than 6.5. Blood pressure is acceptable. I will check a fasting lipid panel. Goal LDL cholesterol is less than 100  11/05/16  Patient is not taking insulin the way written on her list. She is on 70/30, 20 units only in the morning. She is not taking any insulin  at night. She denies any hypoglycemia. She states that her sugars are between 100-150. Diabetic eye exam is up-to-date. Diabetic foot exam is performed today and is mitigated for thick hyperkeratotic skin on both legs distal to the knee, chronic venous stasis changes, hard and firm indurated skin, and no sensation to 10 g monofilament in either feet. She does have palpable pulses at the dorsalis pedis and posterior tibialis bilaterally. She is not exercising. She stays confined to wheelchair the majority of the day due to chronic low back pain and right-sided sciatica. She is getting no exercise whatsoever. She denies any chest pain shortness of breath or dyspnea on exertion although she is extremely sedentary. She is due for fasting lab work.  At that time, my plan was: Patient has a history of a multinodular goiter. Her last ultrasound of her thyroid was done more than 2 years ago. She is due to recheck this. Initial biopsies were negative. Her blood pressure today is borderline but her blood  pressure at home is well controlled between 110 and 120/80. I will check a fasting lipid panel today. Her goal LDL cholesterol is less than 100. I will also check a hemoglobin A1c as well as a urine microalbumin. I had a 30 minute discussion with the patient about increasing her physical activity. If she does not make an attempt, her 5 year life expectancy is extremely low as is her morbidity. I recommended going to a pool and walking in the pool 5 days a week and gradually increasing her exercise until she is able to walk for 30 minutes a day. I would then like her to try to start walking out of the pool. This discontinue Actos to decrease the swelling in her legs and replaced with Victoza to try to improve her ability to lose weight 1.2 mg sq daily.    02/10/17 Victoza caused the patient to have significant nausea and vomiting and abdominal discomfort. She discontinued the medication.  She has lost 14 pounds since discontinuing Actos. Her fasting blood sugars are excellent and typically less than 130. She denies any hypoglycemic episodes. Unfortunate her blood pressure remains very low. The majority of her systolic blood pressures are less than 100. She is currently on lisinopril 40 mg a day. She denies any chest pain shortness of breath or dyspnea on exertion. She is not exercising Past Medical History:  Diagnosis Date  . Arthritis    feet & toes  . Bulging disc   . Diabetes mellitus   . History of hepatitis B   . Hyperlipidemia   . Hypertension   . Neuromuscular disorder (HCC)    in feet and fingers   . Neuropathy    feet  . 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 Peripheral vascular disease (HCC)   . Shortness of breath dyspnea    on lopressor   . Sleep apnea    pt had study done but it was incomplete due to blood sugar issues    Past Surgical History:  Procedure Laterality Date  . HYSTEROSCOPY W/D&C N/A 02/15/2015   Procedure: DILATATION AND CURETTAGE /HYSTEROSCOPY;  Surgeon: Mitchel Honour, DO;  Location: [facility];  Service: Gynecology;  Laterality: N/A;  . TONSILLECTOMY    . tyroid biospy     january 2016   Current Outpatient Prescriptions on File Prior to Visit  Medication Sig Dispense Refill  . ACCU-CHEK AVIVA PLUS test strip CHECK FINGERSTICK BLOOD SUGAR 3 TIMES DAILY FOR FLUCTUATING  BLOOD SUGAR 300 each 12  . ACCU-CHEK SOFTCLIX LANCETS lancets Use to monitor FSBS 3x daily for fluctuating FSBS. DX: 250.00 100 each 12  . Alcohol Swabs (B-D SINGLE USE SWABS REGULAR) PADS Use to monitor FSBS 3x daily for fluctuating FSBS. DX: 250.00 300 each 1  . aspirin 325 MG tablet Take 325 mg by mouth daily.    . Blood Glucose Calibration (ACCU-CHEK AVIVA) SOLN Use to monitor FSBS 3x daily for fluctuating FSBS. DX: 250.00 3 each 3  . Blood Glucose Monitoring Suppl (ACCU-CHEK AVIVA PLUS) W/DEVICE KIT Use to monitor FSBS 3x daily for fluctuating FSBS. DX: 250.00 1 kit 0  . Carboxymethylcellulose Sodium (LUBRICANT EYE DROPS OP) Apply 1 drop to eye as needed (dry eyes or allergies).     . furosemide (LASIX) 20 MG tablet Take 1 tablet (20 mg total) by mouth daily. 90 tablet 3  . gabapentin (NEURONTIN) 300 MG capsule TAKE 1 CAPSULE (300 MG TOTAL) BY MOUTH 3 (THREE) TIMES DAILY. (Patient taking differently: Take 300 mg by mouth 2 (two) times daily. ) 270 capsule 1  . glipiZIDE (GLUCOTROL XL) 10 MG 24 hr tablet Take 1 tablet (10 mg total) by mouth daily with breakfast. 90 tablet 3  . ibuprofen (ADVIL,MOTRIN) 800 MG tablet Take 1 tablet (800 mg total) by mouth every 8 (eight) hours as needed. 30 tablet 0  . liraglutide 18 MG/3ML SOPN Inject 0.2 mLs (1.2 mg total) into the skin daily. (Patient taking differently: Inject 0.6 mg into the skin daily. ) 2 pen 3  . lisinopril (PRINIVIL,ZESTRIL) 40 MG tablet TAKE 1 TABLET (40 MG TOTAL) BY MOUTH DAILY. (Patient taking differently: Take 20 mg by mouth daily. ) 90 tablet 3  . metoprolol tartrate (LOPRESSOR) 25 MG tablet Take 1 tablet (25 mg total) by mouth  2 (two) times daily. 180 tablet 3  . ondansetron (ZOFRAN) 4 MG tablet TAKE 1 TABLET EVERY 6 HOURS AS NEEDED FOR NAUSEA AND VOMITING 60 tablet 0  . potassium chloride (K-DUR) 10 MEQ tablet Take 1 tablet (10 mEq total) by mouth daily. 90 tablet 3  . pravastatin (PRAVACHOL) 40 MG tablet Take 1 tablet (40 mg total) by mouth daily. 90 tablet 3  . traMADol (ULTRAM) 50 MG tablet Take 1 tablet (50 mg total) by mouth every 6 (six) hours as needed. 60 tablet 2   No current facility-administered medications on file prior to visit.    Allergies  Allergen Reactions  . Codeine Anaphylaxis and Hives  . Penicillins Anaphylaxis and Hives  . Food Nausea And Vomiting    Olives   . Januvia [Sitagliptin]     Stomach cramps  . Latex Hives   Social History   Social History  . Marital status: Single    Spouse name: N/A  . Number of children: N/A  . Years of education: N/A   Occupational History  . Not on file.   Social History Main Topics  . Smoking status: Never Smoker  . Smokeless tobacco: Never Used  . Alcohol use No     Comment: social drinker.  . Drug use: No  . Sexual activity: Not on file   Other Topics Concern  . Not on file   Social History Narrative  . No narrative on file   Family History  Problem Relation Age of Onset  . Hyperlipidemia Mother   . Heart disease Father   . Hyperlipidemia Father   . Hypertension Father   . Heart disease Brother   . Stroke  Brother       Review of Systems  Musculoskeletal: Positive for back pain.  All other systems reviewed and are negative.      Objective:   Physical Exam  Constitutional: She is oriented to person, place, and time. She appears well-developed and well-nourished. No distress.  HENT:  Head: Normocephalic and atraumatic.  Right Ear: External ear normal.  Left Ear: External ear normal.  Nose: Nose normal.  Mouth/Throat: Oropharynx is clear and moist. No oropharyngeal exudate.  Eyes: Conjunctivae and EOM are normal.  Pupils are equal, round, and reactive to light. Right eye exhibits no discharge. Left eye exhibits no discharge. No scleral icterus.  Neck: Normal range of motion. Neck supple. No JVD present. No tracheal deviation present. No thyromegaly present.  Cardiovascular: Normal rate, regular rhythm, normal heart sounds and intact distal pulses.  Exam reveals no gallop and no friction rub.   No murmur heard. Pulmonary/Chest: Effort normal and breath sounds normal. No stridor. No respiratory distress. She has no wheezes. She has no rales. She exhibits no tenderness.  Abdominal: Soft. Bowel sounds are normal. She exhibits no distension and no mass. There is no tenderness. There is no rebound and no guarding.  Musculoskeletal: Normal range of motion. She exhibits no edema or tenderness.  Lymphadenopathy:    She has no cervical adenopathy.  Neurological: She is alert and oriented to person, place, and time. She has normal reflexes. No cranial nerve deficit. She exhibits normal muscle tone. Coordination normal.  Skin: Skin is warm. No rash noted. She is not diaphoretic. No erythema. No pallor.  Psychiatric: She has a normal mood and affect. Her behavior is normal. Judgment and thought content normal.  Vitals reviewed.         Assessment & Plan:  Essential hypertension  Type 2 diabetes mellitus with complication, with long-term current use of insulin (HCC)  Pure hypercholesterolemia Blood pressure is too low. Decrease lisinopril to 5 mg a day and recheck blood pressure in one month area did continue metformin but discontinued victoza altogether. Check fasting lipid panel. Goal LDL cholesterol is less than 100. Continue to encourage diet exercise and weight loss.

## 2017-02-11 LAB — HEMOGLOBIN A1C
HEMOGLOBIN A1C: 6 % — AB (ref <5.7)
MEAN PLASMA GLUCOSE: 126 mg/dL

## 2017-02-18 DIAGNOSIS — E113293 Type 2 diabetes mellitus with mild nonproliferative diabetic retinopathy without macular edema, bilateral: Secondary | ICD-10-CM | POA: Diagnosis not present

## 2017-02-18 DIAGNOSIS — H40013 Open angle with borderline findings, low risk, bilateral: Secondary | ICD-10-CM | POA: Diagnosis not present

## 2017-02-18 DIAGNOSIS — H2513 Age-related nuclear cataract, bilateral: Secondary | ICD-10-CM | POA: Diagnosis not present

## 2017-02-18 DIAGNOSIS — E119 Type 2 diabetes mellitus without complications: Secondary | ICD-10-CM | POA: Diagnosis not present

## 2017-02-18 LAB — HM DIABETES EYE EXAM

## 2017-02-19 ENCOUNTER — Encounter: Payer: Self-pay | Admitting: Family Medicine

## 2017-03-20 ENCOUNTER — Other Ambulatory Visit: Payer: Self-pay | Admitting: Family Medicine

## 2017-05-19 ENCOUNTER — Ambulatory Visit: Payer: Medicare HMO | Admitting: Family Medicine

## 2017-05-26 ENCOUNTER — Other Ambulatory Visit: Payer: Self-pay | Admitting: Family Medicine

## 2017-07-02 ENCOUNTER — Ambulatory Visit (INDEPENDENT_AMBULATORY_CARE_PROVIDER_SITE_OTHER): Payer: Medicare HMO | Admitting: Family Medicine

## 2017-07-02 DIAGNOSIS — Z23 Encounter for immunization: Secondary | ICD-10-CM

## 2017-08-18 ENCOUNTER — Ambulatory Visit (INDEPENDENT_AMBULATORY_CARE_PROVIDER_SITE_OTHER): Payer: Medicare HMO | Admitting: Family Medicine

## 2017-08-18 ENCOUNTER — Encounter: Payer: Self-pay | Admitting: Family Medicine

## 2017-08-18 VITALS — BP 154/74 | HR 98 | Temp 98.7°F | Resp 18 | Ht 66.0 in | Wt 317.0 lb

## 2017-08-18 DIAGNOSIS — Z23 Encounter for immunization: Secondary | ICD-10-CM

## 2017-08-18 DIAGNOSIS — E7800 Pure hypercholesterolemia, unspecified: Secondary | ICD-10-CM | POA: Diagnosis not present

## 2017-08-18 DIAGNOSIS — E118 Type 2 diabetes mellitus with unspecified complications: Secondary | ICD-10-CM | POA: Diagnosis not present

## 2017-08-18 DIAGNOSIS — I10 Essential (primary) hypertension: Secondary | ICD-10-CM

## 2017-08-18 DIAGNOSIS — I890 Lymphedema, not elsewhere classified: Secondary | ICD-10-CM

## 2017-08-18 DIAGNOSIS — Z794 Long term (current) use of insulin: Secondary | ICD-10-CM | POA: Diagnosis not present

## 2017-08-18 NOTE — Progress Notes (Signed)
Subjective:    Patient ID: Linda Briggs, female    DOB: Mar 27, 1956, 61 y.o.   MRN: [id number]  Medication Refill   Back Pain     05/29/15 HgA1c in June was 6.6 but LDL was 108.  Overall, I was very happy with that.  Patient has worked hard on her diet as well as exercise and has lost 7 pounds since her last office visit. Unfortunately she is seen her sugars start to steadily increased and she is frequently seen sugars are around 200. She attributes this to stress as she is having to care for her mother who has Lewy body dementia. She denies eating more carbohydrates. She denies any recent infections. She denies any chest pain shortness of breath or dyspnea on exertion.  At that time, my plan was: Unfortunately the patient seems to be losing control of her diabetes. I believe this is likely due to the fact the glipizide has lost its effectiveness. Patient is unable to afford SGLT-1, SGLT-2, or GLP inhibitor medications.  Therefore her only option would be to increase her insulin or use Actos as a insulin sensitizer. She has significant edema in her legs. However she would like to try Actos versus increasing her insulin and monitor for worsening edema. Therefore I will check a hemoglobin A1c. Hemoglobin A1c is greater than 8, I would add Actos 30 mg by mouth daily. Her blood pressure is acceptable to I will also check a fasting lipid panel. Goal LDL cholesterol is less than 100. Patient received her flu shot today. Pneumovax 23 is up-to-date  09/18/15 Fortunately since starting Actos, the patient has only gained 1 pound. Furthermore there is no significant edema in her legs. However she ran out of insulin for one month. Therefore she is only been on Actos and insulin together for the last 4 weeks and even so, her HgA1c is 7.4. On this combination, she reports fasting blood sugars under 100, 2 hour postprandial sugars under 180 which for this patient is excellent. Her blood pressure is also elevated  but she ran out of metoprolol. She denies any chest pain shortness of breath or dyspnea on exertion. She is still not exercising. She is watching her diet.  At that time, my plan was: Continue novolin 70/30 70 units in the morning and 30 units in the evening. The patient would like to continue Actos as an insulin sensitizer. We will need to watch closely for weight gain and peripheral edema. I believe her blood pressure improved when she resumes the metoprolol. Cholesterol is acceptable. I have strongly encouraged patient to try to exercise including water aerobics which I believe she can perform even with her body habitus without exacerbating her joint problems in her knees and in her back. She agrees and she states that she will try to start performing this.  01/01/16 Apparently, the patient is responding well to Actos as an insulin sensitizer. It took a little longer than I expected however her insulin requirement has dropped dramatically. When I last saw the patient, she was taking insulin 7030, 70 units in the morning, and 30 units in the evening. She has gradually weaned herself all the way down to just 50 units in the morning and she continues to have occasional episodes of hypoglycemia with her blood sugar in the 70s and sometimes as low as 50. Her weight has gone up 4 pounds since her last office visit and she does believe that it is fluid retention as that her  legs have swollen and become tighter however she is willing to live with this as her sugars have responded dramatically and she is on much less insulin. She has decreased her total insulin consumption from 140 units down to 50 units a day since we started Actos. Her eye exam is up-to-date and was recently checked in March. 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 Unfortunately she continues to suffer from neuropathy in her legs reporting burning and stinging pain on the plantar aspects  of both feet. She also has complete and total numbness on the plantar aspects of both feet. She cannot feel me touch her feet at all.  At that time, my plan was: Her blood pressure today is acceptable and I will make no changes in her medication at this time. I will check a fasting lipid panel and her goal LDL cholesterol is less than 100. Diabetic eye exam and diabetic foot exam are up-to-date. I recommended that the patient examined the plantar surfaces of both feet using a mirror every night given the fact that she has no feeling in her feet so that if she were to develop a sore or wound we can begin to treat it aggressively. Today there is no evidence of this on exam. She does have chronic venous stasis changes and lymphedema in both legs. Blood sugars have dropped dramatically. I have recommended that we spread the insulin dosage out throughout the day to try to lessen hypoglycemic episodes. Since her total insulin requirement today is 50 units, I have recommended she use 35 units in the morning and 15 units in the afternoon. She finally consents to allow me to schedule her for a mammogram. She continues to refuse a colonoscopy. We did discuss cologuard.  04/08/16 Patient continues to do remarkably well. She is on Novolin 70/30. She is taking 30 units in the morning. She frequently does not take her insulin at night because she is experiencing hypoglycemia in the mornings. She seems to be requiring less and less insulin ever since we started her on Actos. She denies any polyuria, polydipsia, or blurred vision. Unfortunately she is experiencing worsening lymphedema in her legs on Actos and some weight gain. She is currently taking Lasix 20 mg a day. She denies any chest pain shortness of breath dyspnea on exertion or orthopnea. She is due for fasting lab work.    07/22/16 She is here today for follow-up. Her last hemoglobin A1c was remarkable at 5.8. We discontinued some of her medication. She states that  her fasting blood sugars between 100-130. She states that her two-hour postprandial sugars are typically less than 160. Unfortunately since last time I saw her she is developed severe right-sided low back pain with right-sided sciatica radiating from her hip into her right foot. She has persistent neuropathy chronically and therefore she denies any new numbness or tingling in her foot. She denies any chest pain shortness of breath or dyspnea on exertion. AT that time, my plan was: Patient is having severe right-sided sciatica. Begin prednisone taper pack. Also use tramadol 50 mg every 8 hours when necessary pain. Check hemoglobin A1c. Goal hemoglobin A1c is less than 6.5. Blood pressure is acceptable. I will check a fasting lipid panel. Goal LDL cholesterol is less than 100  11/05/16  Patient is not taking insulin the way written on her list. She is on 70/30, 20 units only in the morning. She is not taking any insulin  at night. She denies any hypoglycemia. She states that her sugars are between 100-150. Diabetic eye exam is up-to-date. Diabetic foot exam is performed today and is mitigated for thick hyperkeratotic skin on both legs distal to the knee, chronic venous stasis changes, hard and firm indurated skin, and no sensation to 10 g monofilament in either feet. She does have palpable pulses at the dorsalis pedis and posterior tibialis bilaterally. She is not exercising. She stays confined to wheelchair the majority of the day due to chronic low back pain and right-sided sciatica. She is getting no exercise whatsoever. She denies any chest pain shortness of breath or dyspnea on exertion although she is extremely sedentary. She is due for fasting lab work.  At that time, my plan was: Patient has a history of a multinodular goiter. Her last ultrasound of her thyroid was done more than 2 years ago. She is due to recheck this. Initial biopsies were negative. Her blood pressure today is borderline but her blood  pressure at home is well controlled between 110 and 120/80. I will check a fasting lipid panel today. Her goal LDL cholesterol is less than 100. I will also check a hemoglobin A1c as well as a urine microalbumin. I had a 30 minute discussion with the patient about increasing her physical activity. If she does not make an attempt, her 5 year life expectancy is extremely low as is her morbidity. I recommended going to a pool and walking in the pool 5 days a week and gradually increasing her exercise until she is able to walk for 30 minutes a day. I would then like her to try to start walking out of the pool. This discontinue Actos to decrease the swelling in her legs and replaced with Victoza to try to improve her ability to lose weight 1.2 mg sq daily.    02/10/17 Victoza caused the patient to have significant nausea and vomiting and abdominal discomfort. She discontinued the medication.  She has lost 14 pounds since discontinuing Actos. Her fasting blood sugars are excellent and typically less than 130. She denies any hypoglycemic episodes. Unfortunate her blood pressure remains very low. The majority of her systolic blood pressures are less than 100. She is currently on lisinopril 40 mg a day. She denies any chest pain shortness of breath or dyspnea on exertion. She is not exercising.  At that time, my plan was: Blood pressure is too low. Decrease lisinopril to 5 mg a day and recheck blood pressure in one month.  I will continue metformin but discontinued victoza altogether. Check fasting lipid panel. Goal LDL cholesterol is less than 100. Continue to encourage diet exercise and weight loss.  08/18/17 Here for follow up. At her last visit, HgA1c was 6.0!  However 3 months ago, the patient resumed 70/30 insulin, 40 units in the morning and 30 units in the evening due to hyperglycemia.  Patient states her fasting blood sugars are now around 100.  Her 2-hour postprandial sugars are near 150.  She denies any  hypoglycemic episodes.  She denies any polyuria, polydipsia, or blurry vision.  She denies any chest pain, shortness of breath, or dyspnea on exertion.  She has had her flu shot.  She has had Pneumovax 23.  She is due for Prevnar 13.  Unfortunately her mother is dying from sepsis.  Patient is very distraught regarding this.  This explains the mild elevation in her blood pressure.  She is going straight to the hospital as soon as  she leaves here.  She is crying today on exam. Past Medical History:  Diagnosis Date  . Arthritis    feet & toes  . Bulging disc   . Diabetes mellitus   . History of hepatitis B   . Hyperlipidemia   . Hypertension   . Neuromuscular disorder ([place])    in feet and fingers   . Neuropathy    feet  . 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 Peripheral vascular disease ([place])   . Shortness of breath dyspnea    on lopressor   . Sleep apnea    pt had study done but it was incomplete due to blood sugar issues    Past Surgical History:  Procedure Laterality Date  . HYSTEROSCOPY W/D&C N/A 02/15/2015   Procedure: DILATATION AND CURETTAGE /HYSTEROSCOPY;  Surgeon: Linda Hedges, DO;  Location: [facility];  Service: Gynecology;  Laterality: N/A;  . TONSILLECTOMY    . tyroid biospy     january 2016   Current Outpatient Medications on File Prior to Visit  Medication Sig Dispense Refill  . ACCU-CHEK AVIVA PLUS test strip CHECK FINGERSTICK BLOOD SUGAR 3 TIMES DAILY FOR FLUCTUATING BLOOD SUGAR 300 each 12  . ACCU-CHEK SOFTCLIX LANCETS lancets Use to monitor FSBS 3x daily for fluctuating FSBS. DX: 250.00 100 each 12  . Alcohol Swabs (B-D SINGLE USE SWABS REGULAR) PADS Use to monitor FSBS 3x daily for fluctuating FSBS. DX: 250.00 300 each 1  . aspirin 325 MG tablet Take 325 mg by mouth daily.    . Blood Glucose Calibration (ACCU-CHEK AVIVA) SOLN Use to monitor FSBS 3x daily for fluctuating FSBS. DX: 250.00 3 each 3  . Blood Glucose Monitoring Suppl (ACCU-CHEK AVIVA PLUS) W/DEVICE  KIT Use to monitor FSBS 3x daily for fluctuating FSBS. DX: 250.00 1 kit 0  . Carboxymethylcellulose Sodium (LUBRICANT EYE DROPS OP) Apply 1 drop to eye as needed (dry eyes or allergies).     . furosemide (LASIX) 20 MG tablet TAKE 1 TABLET EVERY DAY 90 tablet 3  . gabapentin (NEURONTIN) 300 MG capsule TAKE 1 CAPSULE THREE TIMES DAILY 270 capsule 1  . glipiZIDE (GLUCOTROL XL) 10 MG 24 hr tablet Take 1 tablet (10 mg total) by mouth daily with breakfast. 90 tablet 3  . ibuprofen (ADVIL,MOTRIN) 800 MG tablet Take 1 tablet (800 mg total) by mouth every 8 (eight) hours as needed. 30 tablet 0  . liraglutide 18 MG/3ML SOPN Inject 0.2 mLs (1.2 mg total) into the skin daily. (Patient taking differently: Inject 0.6 mg into the skin daily. ) 2 pen 3  . lisinopril (PRINIVIL,ZESTRIL) 40 MG tablet TAKE 1 TABLET (40 MG TOTAL) BY MOUTH DAILY. (Patient taking differently: Take 20 mg by mouth daily. ) 90 tablet 3  . lisinopril (PRINIVIL,ZESTRIL) 5 MG tablet Take 1 tablet (5 mg total) by mouth daily. 90 tablet 3  . metFORMIN (GLUCOPHAGE) 1000 MG tablet Take 1,000 mg by mouth 2 (two) times daily with a meal.    . metoprolol tartrate (LOPRESSOR) 25 MG tablet Take 1 tablet (25 mg total) by mouth 2 (two) times daily. 180 tablet 3  . ondansetron (ZOFRAN) 4 MG tablet TAKE 1 TABLET EVERY 6 HOURS AS NEEDED FOR NAUSEA AND VOMITING 60 tablet 0  . potassium chloride (K-DUR) 10 MEQ tablet Take 1 tablet (10 mEq total) by mouth daily. 90 tablet 3  . pravastatin (PRAVACHOL) 40 MG tablet Take 1 tablet (40 mg total) by mouth daily. 90 tablet 3  .  traMADol (ULTRAM) 50 MG tablet Take 1 tablet (50 mg total) by mouth every 6 (six) hours as needed. 60 tablet 2   No current facility-administered medications on file prior to visit.    Allergies  Allergen Reactions  . Codeine Anaphylaxis and Hives  . Penicillins Anaphylaxis and Hives  . Food Nausea And Vomiting    Olives   . Januvia [Sitagliptin]     Stomach cramps  . Latex Hives    Social History   Socioeconomic History  . Marital status: Single    Spouse name: Not on file  . Number of children: Not on file  . Years of education: Not on file  . Highest education level: Not on file  Social Needs  . Financial resource strain: Not on file  . Food insecurity - worry: Not on file  . Food insecurity - inability: Not on file  . Transportation needs - medical: Not on file  . Transportation needs - non-medical: Not on file  Occupational History  . Not on file  Tobacco Use  . Smoking status: Never Smoker  . Smokeless tobacco: Never Used  Substance and Sexual Activity  . Alcohol use: No    Alcohol/week: 0.0 oz    Comment: social drinker.  . Drug use: No  . Sexual activity: Not on file  Other Topics Concern  . Not on file  Social History Narrative  . Not on file   Family History  Problem Relation Age of Onset  . Hyperlipidemia Mother   . Heart disease Father   . Hyperlipidemia Father   . Hypertension Father   . Heart disease Brother   . Stroke Brother       Review of Systems  Musculoskeletal: Positive for back pain.  All other systems reviewed and are negative.      Objective:   Physical Exam  Constitutional: She is oriented to person, place, and time. She appears well-developed and well-nourished. No distress.  HENT:  Head: Normocephalic and atraumatic.  Right Ear: External ear normal.  Left Ear: External ear normal.  Nose: Nose normal.  Mouth/Throat: Oropharynx is clear and moist. No oropharyngeal exudate.  Eyes: Conjunctivae and EOM are normal. Pupils are equal, round, and reactive to light. Right eye exhibits no discharge. Left eye exhibits no discharge. No scleral icterus.  Neck: Normal range of motion. Neck supple. No JVD present. No tracheal deviation present. No thyromegaly present.  Cardiovascular: Normal rate, regular rhythm, normal heart sounds and intact distal pulses. Exam reveals no gallop and no friction rub.  No murmur  heard. Pulmonary/Chest: Effort normal and breath sounds normal. No stridor. No respiratory distress. She has no wheezes. She has no rales. She exhibits no tenderness.  Abdominal: Soft. Bowel sounds are normal. She exhibits no distension and no mass. There is no tenderness. There is no rebound and no guarding.  Musculoskeletal: Normal range of motion. She exhibits no edema or tenderness.  Lymphadenopathy:    She has no cervical adenopathy.  Neurological: She is alert and oriented to person, place, and time. She has normal reflexes. No cranial nerve deficit. She exhibits normal muscle tone. Coordination normal.  Skin: Skin is warm. No rash noted. She is not diaphoretic. No erythema. No pallor.  Psychiatric: She has a normal mood and affect. Her behavior is normal. Judgment and thought content normal.  Vitals reviewed.         Assessment & Plan:  Essential hypertension  Type 2 diabetes mellitus with complication, with long-term current  use of insulin ([place]) - Plan: CBC with Differential/Platelet, COMPLETE METABOLIC PANEL WITH GFR, Lipid panel, Microalbumin, urine, Hemoglobin A1c  Pure hypercholesterolemia  Morbid obesity ([facility])  Lymphedema  Need for prophylactic vaccination against Streptococcus pneumoniae (pneumococcus) - Plan: Pneumococcal conjugate vaccine 13-valent IM  Blood pressure is elevated due to acute grief reaction.  Her typical blood pressure is well controlled at 120/80.  Her blood sugars sound excellent.  However I am concerned about hypoglycemia on such high-dose insulin particularly fast acting insulin.  I recommended discontinuation of 70/30 insulin and replacing it with Trulicity 1.5 mg weekly.  Patient will try samples of the medication over the first 2 weeks and call us back and let us know how she is doing.  I will be glad to call out a prescription for this if she sees benefit.  Monitor for nausea and vomiting.  Check fasting lipid panel.  Goal LDL cholesterol is less  than 100.  Patient received Prevnar 13 today in the office.  I gave the patient my deepest sympathy is regarding her mother's illness

## 2017-08-19 LAB — CBC WITH DIFFERENTIAL/PLATELET
BASOS ABS: 84 {cells}/uL (ref 0–200)
Basophils Relative: 0.9 %
EOS ABS: 205 {cells}/uL (ref 15–500)
Eosinophils Relative: 2.2 %
HCT: 38.6 % (ref 35.0–45.0)
HEMOGLOBIN: 13.2 g/dL (ref 11.7–15.5)
Lymphs Abs: 2362 cells/uL (ref 850–3900)
MCH: 30.8 pg (ref 27.0–33.0)
MCHC: 34.2 g/dL (ref 32.0–36.0)
MCV: 90 fL (ref 80.0–100.0)
MONOS PCT: 4.7 %
MPV: 10 fL (ref 7.5–12.5)
Neutro Abs: 6212 cells/uL (ref 1500–7800)
Neutrophils Relative %: 66.8 %
Platelets: 130 10*3/uL — ABNORMAL LOW (ref 140–400)
RBC: 4.29 10*6/uL (ref 3.80–5.10)
RDW: 13 % (ref 11.0–15.0)
TOTAL LYMPHOCYTE: 25.4 %
WBC: 9.3 10*3/uL (ref 3.8–10.8)
WBCMIX: 437 {cells}/uL (ref 200–950)

## 2017-08-19 LAB — COMPLETE METABOLIC PANEL WITH GFR
AG RATIO: 1.4 (calc) (ref 1.0–2.5)
ALBUMIN MSPROF: 3.8 g/dL (ref 3.6–5.1)
ALKALINE PHOSPHATASE (APISO): 66 U/L (ref 33–130)
ALT: 15 U/L (ref 6–29)
AST: 17 U/L (ref 10–35)
BILIRUBIN TOTAL: 0.6 mg/dL (ref 0.2–1.2)
BUN: 13 mg/dL (ref 7–25)
CHLORIDE: 102 mmol/L (ref 98–110)
CO2: 27 mmol/L (ref 20–32)
Calcium: 8.8 mg/dL (ref 8.6–10.4)
Creat: 0.99 mg/dL (ref 0.50–0.99)
GFR, EST AFRICAN AMERICAN: 72 mL/min/{1.73_m2} (ref >=60)
GFR, Est Non African American: 62 mL/min/{1.73_m2} (ref >=60)
GLUCOSE: 161 mg/dL — AB (ref 65–99)
Globulin: 2.7 g/dL (calc) (ref 1.9–3.7)
POTASSIUM: 4.2 mmol/L (ref 3.5–5.3)
SODIUM: 139 mmol/L (ref 135–146)
Total Protein: 6.5 g/dL (ref 6.1–8.1)

## 2017-08-19 LAB — LIPID PANEL
CHOLESTEROL: 200 mg/dL — AB (ref <200)
HDL: 28 mg/dL — ABNORMAL LOW (ref >50)
LDL CHOLESTEROL (CALC): 145 mg/dL — AB
Non-HDL Cholesterol (Calc): 172 mg/dL (calc) — ABNORMAL HIGH (ref <130)
Total CHOL/HDL Ratio: 7.1 (calc) — ABNORMAL HIGH (ref <5.0)
Triglycerides: 141 mg/dL (ref <150)

## 2017-08-19 LAB — MICROALBUMIN, URINE: Microalb, Ur: 2.3 mg/dL

## 2017-08-19 LAB — HEMOGLOBIN A1C
HEMOGLOBIN A1C: 6.6 %{Hb} — AB (ref <5.7)
MEAN PLASMA GLUCOSE: 143 (calc)
eAG (mmol/L): 7.9 (calc)

## 2017-08-20 ENCOUNTER — Other Ambulatory Visit: Payer: Self-pay | Admitting: Family Medicine

## 2017-08-20 MED ORDER — ATORVASTATIN CALCIUM 40 MG PO TABS: 40.0000 mg | ORAL_TABLET | Freq: Every day | ORAL | 3 refills | Status: DC

## 2017-09-01 DIAGNOSIS — H40013 Open angle with borderline findings, low risk, bilateral: Secondary | ICD-10-CM | POA: Diagnosis not present

## 2017-09-04 ENCOUNTER — Other Ambulatory Visit: Payer: Self-pay | Admitting: Family Medicine

## 2017-09-10 ENCOUNTER — Telehealth: Payer: Self-pay | Admitting: Family Medicine

## 2017-09-10 NOTE — Telephone Encounter (Signed)
Patient got samples of trulicity, appears to be working would like this to be called in to [facility] if possible

## 2017-09-11 MED ORDER — DULAGLUTIDE 1.5 MG/0.5ML ~~LOC~~ SOAJ: 1.5000 mg | SUBCUTANEOUS | 1 refills | Status: DC

## 2017-09-11 NOTE — Telephone Encounter (Signed)
Medication called/sent to requested pharmacy  

## 2017-10-06 ENCOUNTER — Other Ambulatory Visit: Payer: Self-pay | Admitting: Family Medicine

## 2017-11-05 ENCOUNTER — Other Ambulatory Visit: Payer: Self-pay | Admitting: Family Medicine

## 2017-11-05 DIAGNOSIS — E042 Nontoxic multinodular goiter: Secondary | ICD-10-CM

## 2017-11-07 ENCOUNTER — Other Ambulatory Visit: Payer: Self-pay | Admitting: Family Medicine

## 2017-11-12 ENCOUNTER — Other Ambulatory Visit: Payer: Medicare HMO

## 2017-11-12 ENCOUNTER — Other Ambulatory Visit: Payer: Self-pay | Admitting: Family Medicine

## 2017-11-12 DIAGNOSIS — E7800 Pure hypercholesterolemia, unspecified: Secondary | ICD-10-CM

## 2017-11-12 DIAGNOSIS — E118 Type 2 diabetes mellitus with unspecified complications: Secondary | ICD-10-CM

## 2017-11-12 DIAGNOSIS — Z79899 Other long term (current) drug therapy: Secondary | ICD-10-CM

## 2017-11-12 DIAGNOSIS — I10 Essential (primary) hypertension: Secondary | ICD-10-CM

## 2017-11-12 DIAGNOSIS — Z794 Long term (current) use of insulin: Secondary | ICD-10-CM | POA: Diagnosis not present

## 2017-11-13 LAB — CBC WITH DIFFERENTIAL/PLATELET
Basophils Absolute: 76 cells/uL (ref 0–200)
Basophils Relative: 0.7 %
Eosinophils Absolute: 140 cells/uL (ref 15–500)
Eosinophils Relative: 1.3 %
HCT: 37.8 % (ref 35.0–45.0)
Hemoglobin: 13.2 g/dL (ref 11.7–15.5)
Lymphs Abs: 2830 cells/uL (ref 850–3900)
MCH: 31.9 pg (ref 27.0–33.0)
MCHC: 34.9 g/dL (ref 32.0–36.0)
MCV: 91.3 fL (ref 80.0–100.0)
MONOS PCT: 4.7 %
MPV: 9.9 fL (ref 7.5–12.5)
NEUTROS PCT: 67.1 %
Neutro Abs: 7247 cells/uL (ref 1500–7800)
PLATELETS: 139 10*3/uL — AB (ref 140–400)
RBC: 4.14 10*6/uL (ref 3.80–5.10)
RDW: 13.1 % (ref 11.0–15.0)
TOTAL LYMPHOCYTE: 26.2 %
WBC: 10.8 10*3/uL (ref 3.8–10.8)
WBCMIX: 508 {cells}/uL (ref 200–950)

## 2017-11-13 LAB — COMPREHENSIVE METABOLIC PANEL
AG RATIO: 1.6 (calc) (ref 1.0–2.5)
ALT: 18 U/L (ref 6–29)
AST: 18 U/L (ref 10–35)
Albumin: 3.8 g/dL (ref 3.6–5.1)
Alkaline phosphatase (APISO): 71 U/L (ref 33–130)
BUN / CREAT RATIO: 19 (calc) (ref 6–22)
BUN: 19 mg/dL (ref 7–25)
CHLORIDE: 103 mmol/L (ref 98–110)
CO2: 27 mmol/L (ref 20–32)
CREATININE: 1 mg/dL — AB (ref 0.50–0.99)
Calcium: 8.9 mg/dL (ref 8.6–10.4)
GLOBULIN: 2.4 g/dL (ref 1.9–3.7)
GLUCOSE: 144 mg/dL — AB (ref 65–99)
Potassium: 4.5 mmol/L (ref 3.5–5.3)
SODIUM: 138 mmol/L (ref 135–146)
TOTAL PROTEIN: 6.2 g/dL (ref 6.1–8.1)
Total Bilirubin: 0.5 mg/dL (ref 0.2–1.2)

## 2017-11-13 LAB — HEMOGLOBIN A1C
EAG (MMOL/L): 7.3 (calc)
Hgb A1c MFr Bld: 6.2 % of total Hgb — ABNORMAL HIGH (ref <5.7)
Mean Plasma Glucose: 131 (calc)

## 2017-11-13 LAB — LIPID PANEL
CHOL/HDL RATIO: 5.4 (calc) — AB (ref <5.0)
Cholesterol: 141 mg/dL (ref <200)
HDL: 26 mg/dL — ABNORMAL LOW (ref >50)
LDL Cholesterol (Calc): 90 mg/dL (calc)
NON-HDL CHOLESTEROL (CALC): 115 mg/dL (ref <130)
TRIGLYCERIDES: 151 mg/dL — AB (ref <150)

## 2017-11-17 ENCOUNTER — Ambulatory Visit (INDEPENDENT_AMBULATORY_CARE_PROVIDER_SITE_OTHER): Payer: Medicare HMO | Admitting: Family Medicine

## 2017-11-17 ENCOUNTER — Encounter: Payer: Self-pay | Admitting: Family Medicine

## 2017-11-17 VITALS — BP 118/70 | HR 84 | Temp 98.6°F | Resp 18 | Ht 66.0 in | Wt 309.0 lb

## 2017-11-17 DIAGNOSIS — I10 Essential (primary) hypertension: Secondary | ICD-10-CM | POA: Diagnosis not present

## 2017-11-17 DIAGNOSIS — Z794 Long term (current) use of insulin: Secondary | ICD-10-CM | POA: Diagnosis not present

## 2017-11-17 DIAGNOSIS — E7800 Pure hypercholesterolemia, unspecified: Secondary | ICD-10-CM | POA: Diagnosis not present

## 2017-11-17 DIAGNOSIS — I890 Lymphedema, not elsewhere classified: Secondary | ICD-10-CM

## 2017-11-17 DIAGNOSIS — E118 Type 2 diabetes mellitus with unspecified complications: Secondary | ICD-10-CM

## 2017-11-17 NOTE — Progress Notes (Signed)
Subjective:    Patient ID: Linda Briggs, female    DOB: Nov 09, 1955, 62 y.o.   MRN: [id number]  Medication Refill   Back Pain     11/05/16  Patient is not taking insulin the way written on her list. She is on 70/30, 20 units only in the morning. She is not taking any insulin at night. She denies any hypoglycemia. She states that her sugars are between 100-150. Diabetic eye exam is up-to-date. Diabetic foot exam is performed today and is mitigated for thick hyperkeratotic skin on both legs distal to the knee, chronic venous stasis changes, hard and firm indurated skin, and no sensation to 10 g monofilament in either feet. She does have palpable pulses at the dorsalis pedis and posterior tibialis bilaterally. She is not exercising. She stays confined to wheelchair the majority of the day due to chronic low back pain and right-sided sciatica. She is getting no exercise whatsoever. She denies any chest pain shortness of breath or dyspnea on exertion although she is extremely sedentary. She is due for fasting lab work.  At that time, my plan was: Patient has a history of a multinodular goiter. Her last ultrasound of her thyroid was done more than 2 years ago. She is due to recheck this. Initial biopsies were negative. Her blood pressure today is borderline but her blood pressure at home is well controlled between 110 and 120/80. I will check a fasting lipid panel today. Her goal LDL cholesterol is less than 100. I will also check a hemoglobin A1c as well as a urine microalbumin. I had a 30 minute discussion with the patient about increasing her physical activity. If she does not make an attempt, her 5 year life expectancy is extremely low as is her morbidity. I recommended going to a pool and walking in the pool 5 days a week and gradually increasing her exercise until she is able to walk for 30 minutes a day. I would then like her to try to start walking out of the pool. This discontinue Actos to decrease  the swelling in her legs and replaced with Victoza to try to improve her ability to lose weight 1.2 mg sq daily.    02/10/17 Victoza caused the patient to have significant nausea and vomiting and abdominal discomfort. She discontinued the medication.  She has lost 14 pounds since discontinuing Actos. Her fasting blood sugars are excellent and typically less than 130. She denies any hypoglycemic episodes. Unfortunate her blood pressure remains very low. The majority of her systolic blood pressures are less than 100. She is currently on lisinopril 40 mg a day. She denies any chest pain shortness of breath or dyspnea on exertion. She is not exercising.  At that time, my plan was: Blood pressure is too low. Decrease lisinopril to 5 mg a day and recheck blood pressure in one month.  I will continue metformin but discontinued victoza altogether. Check fasting lipid panel. Goal LDL cholesterol is less than 100. Continue to encourage diet exercise and weight loss.  08/18/17 Here for follow up. At her last visit, HgA1c was 6.0!  However 3 months ago, the patient resumed 70/30 insulin, 40 units in the morning and 30 units in the evening due to hyperglycemia.  Patient states her fasting blood sugars are now around 100.  Her 2-hour postprandial sugars are near 150.  She denies any hypoglycemic episodes.  She denies any polyuria, polydipsia, or blurry vision.  She denies any chest pain, shortness of  breath, or dyspnea on exertion.  She has had her flu shot.  She has had Pneumovax 23.  She is due for Prevnar 13.  Unfortunately her mother is dying from sepsis.  Patient is very distraught regarding this.  This explains the mild elevation in her blood pressure.  She is going straight to the hospital as soon as she leaves here.  She is crying today on exam.  At that time, my plan was: Blood pressure is elevated due to acute grief reaction.  Her typical blood pressure is well controlled at 120/80.  Her blood sugars sound  excellent.  However I am concerned about hypoglycemia on such high-dose insulin particularly fast acting insulin.  I recommended discontinuation of 70/30 insulin and replacing it with Trulicity 1.5 mg weekly.  Patient will try samples of the medication over the first 2 weeks and call us back and let us know how she is doing.  I will be glad to call out a prescription for this if she sees benefit.  Monitor for nausea and vomiting.  Check fasting lipid panel.  Goal LDL cholesterol is less than 100.  Patient received Prevnar 13 today in the office.  I gave the patient my deepest sympathy is regarding her mother's illness  11/17/17 Most recent labs are below: Lab on 11/12/2017  Component Date Value Ref Range Status  . WBC 11/12/2017 10.8  3.8 - 10.8 Thousand/uL Final  . RBC 11/12/2017 4.14  3.80 - 5.10 Million/uL Final  . Hemoglobin 11/12/2017 13.2  11.7 - 15.5 g/dL Final  . HCT 11/12/2017 37.8  35.0 - 45.0 % Final  . MCV 11/12/2017 91.3  80.0 - 100.0 fL Final  . MCH 11/12/2017 31.9  27.0 - 33.0 pg Final  . MCHC 11/12/2017 34.9  32.0 - 36.0 g/dL Final  . RDW 11/12/2017 13.1  11.0 - 15.0 % Final  . Platelets 11/12/2017 139* 140 - 400 Thousand/uL Final  . MPV 11/12/2017 9.9  7.5 - 12.5 fL Final  . Neutro Abs 11/12/2017 7,247  1,500 - 7,800 cells/uL Final  . Lymphs Abs 11/12/2017 2,830  850 - 3,900 cells/uL Final  . WBC mixed population 11/12/2017 508  200 - 950 cells/uL Final  . Eosinophils Absolute 11/12/2017 140  15 - 500 cells/uL Final  . Basophils Absolute 11/12/2017 76  0 - 200 cells/uL Final  . Neutrophils Relative % 11/12/2017 67.1  % Final  . Total Lymphocyte 11/12/2017 26.2  % Final  . Monocytes Relative 11/12/2017 4.7  % Final  . Eosinophils Relative 11/12/2017 1.3  % Final  . Basophils Relative 11/12/2017 0.7  % Final  . Glucose, Bld 11/12/2017 144* 65 - 99 mg/dL Final   Comment: .            Fasting reference interval . For someone without known diabetes, a glucose value >125  mg/dL indicates that they may have diabetes and this should be confirmed with a follow-up test. .   . BUN 11/12/2017 19  7 - 25 mg/dL Final  . Creat 11/12/2017 1.00* 0.50 - 0.99 mg/dL Final   Comment: For patients >47 years of age, the reference limit for Creatinine is approximately 13% higher for people identified as African-American. .   Havery Moros Ratio 11/12/2017 19  6 - 22 (calc) Final  . Sodium 11/12/2017 138  135 - 146 mmol/L Final  . Potassium 11/12/2017 4.5  3.5 - 5.3 mmol/L Final  . Chloride 11/12/2017 103  98 - 110 mmol/L Final  .  CO2 11/12/2017 27  20 - 32 mmol/L Final  . Calcium 11/12/2017 8.9  8.6 - 10.4 mg/dL Final  . Total Protein 11/12/2017 6.2  6.1 - 8.1 g/dL Final  . Albumin 11/12/2017 3.8  3.6 - 5.1 g/dL Final  . Globulin 11/12/2017 2.4  1.9 - 3.7 g/dL (calc) Final  . AG Ratio 11/12/2017 1.6  1.0 - 2.5 (calc) Final  . Total Bilirubin 11/12/2017 0.5  0.2 - 1.2 mg/dL Final  . Alkaline phosphatase (APISO) 11/12/2017 71  33 - 130 U/L Final  . AST 11/12/2017 18  10 - 35 U/L Final  . ALT 11/12/2017 18  6 - 29 U/L Final  . Hgb A1c MFr Bld 11/12/2017 6.2* <5.7 % of total Hgb Final   Comment: For someone without known diabetes, a hemoglobin  A1c value between 5.7% and 6.4% is consistent with prediabetes and should be confirmed with a  follow-up test. . For someone with known diabetes, a value <7% indicates that their diabetes is well controlled. A1c targets should be individualized based on duration of diabetes, age, comorbid conditions, and other considerations. . This assay result is consistent with an increased risk of diabetes. . Currently, no consensus exists regarding use of hemoglobin A1c for diagnosis of diabetes for children. .   . Mean Plasma Glucose 11/12/2017 131  (calc) Final  . eAG (mmol/L) 11/12/2017 7.3  (calc) Final  . Cholesterol 11/12/2017 141  <200 mg/dL Final  . HDL 11/12/2017 26* >50 mg/dL Final  . Triglycerides 11/12/2017 151*  <150 mg/dL Final  . LDL Cholesterol (Calc) 11/12/2017 90  mg/dL (calc) Final   Comment: Reference range: <100 . Desirable range <100 mg/dL for primary prevention;   <70 mg/dL for patients with CHD or diabetic patients  with > or = 2 CHD risk factors. Marland Kitchen LDL-C is now calculated using the Martin-Hopkins  calculation, which is a validated novel method providing  better accuracy than the Friedewald equation in the  estimation of LDL-C.  Cresenciano Genre et al. Annamaria Helling. 6295;284(13): 2061-2068  (http://education.[facility].com/faq/FAQ164)   . Total CHOL/HDL Ratio 11/12/2017 5.4* <5.0 (calc) Final  . Non-HDL Cholesterol (Calc) 11/12/2017 115  <130 mg/dL (calc) Final   Comment: For patients with diabetes plus 1 major ASCVD risk  factor, treating to a non-HDL-C goal of <100 mg/dL  (LDL-C of <70 mg/dL) is considered a therapeutic  option.       Past Medical History:  Diagnosis Date  . Arthritis    feet & toes  . Bulging disc   . Diabetes mellitus   . History of hepatitis B   . Hyperlipidemia   . Hypertension   . Neuromuscular disorder ([place])    in feet and fingers   . Neuropathy    feet  . 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 Peripheral vascular disease ([place])   . Shortness of breath dyspnea    on lopressor   . Sleep apnea    pt had study done but it was incomplete due to blood sugar issues    Past Surgical History:  Procedure Laterality Date  . HYSTEROSCOPY W/D&C N/A 02/15/2015   Procedure: DILATATION AND CURETTAGE /HYSTEROSCOPY;  Surgeon: Linda Hedges, DO;  Location: [facility];  Service: Gynecology;  Laterality: N/A;  . TONSILLECTOMY    . tyroid biospy     january 2016   Current Outpatient Medications on File Prior to Visit  Medication Sig Dispense Refill  . ACCU-CHEK AVIVA PLUS test strip CHECK FINGERSTICK BLOOD  SUGAR 3 TIMES DAILY FOR FLUCTUATING BLOOD SUGAR 300 each 12  . ACCU-CHEK SOFTCLIX LANCETS lancets Use to monitor FSBS 3x daily for fluctuating FSBS. DX: 250.00  100 each 12  . Alcohol Swabs (B-D SINGLE USE SWABS REGULAR) PADS Use to monitor FSBS 3x daily for fluctuating FSBS. DX: 250.00 300 each 1  . aspirin EC 81 MG tablet Take 81 mg by mouth daily.    Marland Kitchen atorvastatin (LIPITOR) 40 MG tablet Take 1 tablet (40 mg total) by mouth daily. 90 tablet 3  . Blood Glucose Calibration (ACCU-CHEK AVIVA) SOLN Use to monitor FSBS 3x daily for fluctuating FSBS. DX: 250.00 3 each 3  . Blood Glucose Monitoring Suppl (ACCU-CHEK AVIVA PLUS) W/DEVICE KIT Use to monitor FSBS 3x daily for fluctuating FSBS. DX: 250.00 1 kit 0  . Carboxymethylcellulose Sodium (LUBRICANT EYE DROPS OP) Apply 1 drop to eye as needed (dry eyes or allergies).     . Dulaglutide (TRULICITY) 1.5 HL/4.5GY SOPN Inject 1.5 mg into the skin once a week. 12 pen 1  . furosemide (LASIX) 20 MG tablet TAKE 1 TABLET EVERY DAY 90 tablet 3  . gabapentin (NEURONTIN) 300 MG capsule TAKE 1 CAPSULE THREE TIMES DAILY 270 capsule 1  . glipiZIDE (GLUCOTROL XL) 10 MG 24 hr tablet TAKE 1 TABLET EVERY DAY WITH BREAKFAST 90 tablet 3  . ibuprofen (ADVIL,MOTRIN) 800 MG tablet Take 1 tablet (800 mg total) by mouth every 8 (eight) hours as needed. 30 tablet 0  . Insulin NPH Isophane & Regular (NOVOLIN 70/30 [place]) Inject into the skin. 40 units QAM & 30 units QPM    . lisinopril (PRINIVIL,ZESTRIL) 40 MG tablet TAKE 1 TABLET (40 MG TOTAL) BY MOUTH DAILY. (Patient taking differently: Take 10 mg by mouth daily. ) 90 tablet 3  . lisinopril (PRINIVIL,ZESTRIL) 5 MG tablet Take 1 tablet (5 mg total) by mouth daily. (Patient not taking: Reported on 08/18/2017) 90 tablet 3  . metFORMIN (GLUCOPHAGE) 1000 MG tablet TAKE 1 TABLET TWICE DAILY WITH A MEAL 180 tablet 3  . metoprolol tartrate (LOPRESSOR) 25 MG tablet TAKE 1 TABLET TWICE DAILY 180 tablet 3  . ondansetron (ZOFRAN) 4 MG tablet TAKE 1 TABLET EVERY 6 HOURS AS NEEDED FOR NAUSEA AND VOMITING 60 tablet 0  . potassium chloride (K-DUR) 10 MEQ tablet Take 1 tablet (10 mEq total) by mouth  daily. 90 tablet 3  . potassium chloride (K-DUR,KLOR-CON) 10 MEQ tablet TAKE 1 TABLET EVERY DAY 90 tablet 3  . pravastatin (PRAVACHOL) 40 MG tablet Take 1 tablet (40 mg total) by mouth daily. 90 tablet 3  . traMADol (ULTRAM) 50 MG tablet Take 1 tablet (50 mg total) by mouth every 6 (six) hours as needed. 60 tablet 2   No current facility-administered medications on file prior to visit.    Allergies  Allergen Reactions  . Codeine Anaphylaxis and Hives  . Penicillins Anaphylaxis and Hives  . Food Nausea And Vomiting    Olives   . Januvia [Sitagliptin]     Stomach cramps  . Latex Hives   Social History   Socioeconomic History  . Marital status: Single    Spouse name: Not on file  . Number of children: Not on file  . Years of education: Not on file  . Highest education level: Not on file  Social Needs  . Financial resource strain: Not on file  . Food insecurity - worry: Not on file  . Food insecurity - inability: Not on file  . Transportation needs -  medical: Not on file  . Transportation needs - non-medical: Not on file  Occupational History  . Not on file  Tobacco Use  . Smoking status: Never Smoker  . Smokeless tobacco: Never Used  Substance and Sexual Activity  . Alcohol use: No    Alcohol/week: 0.0 oz    Comment: social drinker.  . Drug use: No  . Sexual activity: Not on file  Other Topics Concern  . Not on file  Social History Narrative  . Not on file   Family History  Problem Relation Age of Onset  . Hyperlipidemia Mother   . Heart disease Father   . Hyperlipidemia Father   . Hypertension Father   . Heart disease Brother   . Stroke Brother       Review of Systems  Musculoskeletal: Positive for back pain.  All other systems reviewed and are negative.      Objective:   Physical Exam  Constitutional: She is oriented to person, place, and time. She appears well-developed and well-nourished. No distress.  HENT:  Head: Normocephalic and atraumatic.    Right Ear: External ear normal.  Left Ear: External ear normal.  Nose: Nose normal.  Mouth/Throat: Oropharynx is clear and moist. No oropharyngeal exudate.  Eyes: Conjunctivae and EOM are normal. Pupils are equal, round, and reactive to light. Right eye exhibits no discharge. Left eye exhibits no discharge. No scleral icterus.  Neck: Normal range of motion. Neck supple. No JVD present. No tracheal deviation present. No thyromegaly present.  Cardiovascular: Normal rate, regular rhythm, normal heart sounds and intact distal pulses. Exam reveals no gallop and no friction rub.  No murmur heard. Pulmonary/Chest: Effort normal and breath sounds normal. No stridor. No respiratory distress. She has no wheezes. She has no rales. She exhibits no tenderness.  Abdominal: Soft. Bowel sounds are normal. She exhibits no distension and no mass. There is no tenderness. There is no rebound and no guarding.  Musculoskeletal: Normal range of motion. She exhibits no edema or tenderness.  Lymphadenopathy:    She has no cervical adenopathy.  Neurological: She is alert and oriented to person, place, and time. She has normal reflexes. No cranial nerve deficit. She exhibits normal muscle tone. Coordination normal.  Skin: Skin is warm. No rash noted. She is not diaphoretic. No erythema. No pallor.  Psychiatric: She has a normal mood and affect. Her behavior is normal. Judgment and thought content normal.  Vitals reviewed.         Assessment & Plan:  Essential hypertension  Type 2 diabetes mellitus with complication, with long-term current use of insulin (HCC)  Pure hypercholesterolemia  Morbid obesity ([place])  Lymphedema  Patient is asymptomatic.  Since her last visit, she has lost 8 pounds.  Her blood pressure is excellent at 118/70.  Her hemoglobin A1c is well controlled at 6.2.  Now that she is compliant with pravastatin which she was not previously taking regularly, her LDL cholesterol has fallen well  below 100.  She has a follow-up appointment for her thyroid ultrasound later this month to monitor her multinodular goiter.  If this is stable, follow-up in 6 months.  Continue to encourage exercise and weight loss

## 2017-11-20 ENCOUNTER — Ambulatory Visit
Admission: RE | Admit: 2017-11-20 | Discharge: 2017-11-20 | Disposition: A | Discharge status: Home / Self Care | Payer: Medicare HMO | Source: Ambulatory Visit | Attending: Family Medicine | Admitting: Family Medicine

## 2017-11-20 DIAGNOSIS — E042 Nontoxic multinodular goiter: Secondary | ICD-10-CM

## 2017-11-21 ENCOUNTER — Telehealth: Payer: Self-pay | Admitting: Family Medicine

## 2017-11-21 ENCOUNTER — Encounter: Payer: Self-pay | Admitting: Family Medicine

## 2017-11-21 DIAGNOSIS — R0989 Other specified symptoms and signs involving the circulatory and respiratory systems: Secondary | ICD-10-CM

## 2017-11-21 NOTE — Telephone Encounter (Signed)
Can she provide context:  At night while sleeping (possible apnea) During the day while eating (esophageal spasm) Does food get stuck while eating (blockage in the throat) Depending on symptoms, next step would probably be laryngoscopy.

## 2017-11-21 NOTE — Telephone Encounter (Signed)
Pt states that since US is ok and nodules have not grown would like to know what to do about her intermittent choking sensation?

## 2017-11-24 ENCOUNTER — Encounter: Payer: Self-pay | Admitting: Gastroenterology

## 2017-11-24 NOTE — Telephone Encounter (Signed)
Per Dr. Tanya NonesPickard can send her to GI for endoscopy. Pt aware and referral placed

## 2017-12-24 ENCOUNTER — Telehealth: Payer: Self-pay | Admitting: Family Medicine

## 2017-12-24 NOTE — Telephone Encounter (Signed)
Pt called and states that she can not get her sugars below 115 since stopping her Glipizide and would like to know if she can restart that and why we stopped it?

## 2017-12-25 NOTE — Telephone Encounter (Signed)
Stopped it because her a1c was so low.  I am good with fbs between 80-130.  She can go back on it but it looked like her sugars were running too low.

## 2017-12-25 NOTE — Telephone Encounter (Signed)
Patient aware of providers recommendations.  

## 2018-01-06 ENCOUNTER — Encounter (INDEPENDENT_AMBULATORY_CARE_PROVIDER_SITE_OTHER): Payer: Self-pay

## 2018-01-06 ENCOUNTER — Ambulatory Visit: Payer: Medicare HMO | Admitting: Gastroenterology

## 2018-01-06 ENCOUNTER — Encounter: Payer: Self-pay | Admitting: Gastroenterology

## 2018-01-06 VITALS — BP 114/72 | HR 76 | Ht 66.0 in | Wt 306.1 lb

## 2018-01-06 DIAGNOSIS — R079 Chest pain, unspecified: Secondary | ICD-10-CM | POA: Diagnosis not present

## 2018-01-06 DIAGNOSIS — R6889 Other general symptoms and signs: Secondary | ICD-10-CM | POA: Diagnosis not present

## 2018-01-06 DIAGNOSIS — R1310 Dysphagia, unspecified: Secondary | ICD-10-CM | POA: Diagnosis not present

## 2018-01-06 DIAGNOSIS — R0989 Other specified symptoms and signs involving the circulatory and respiratory systems: Secondary | ICD-10-CM

## 2018-01-06 MED ORDER — PANTOPRAZOLE SODIUM 40 MG PO TBEC: 40.0000 mg | DELAYED_RELEASE_TABLET | Freq: Every day | ORAL | 1 refills | Status: DC

## 2018-01-06 NOTE — Patient Instructions (Addendum)
We have sent the following medications to your pharmacy for you to pick up at your convenience: pantoprazole.    You have been scheduled for a modified barium swallow on 01/12/18 at 11:00am and barium swallow at 1:00pm. Please arrive 15 minutes prior to your test for registration. You will go to [facility] (1st Floor) for your appointment. Please refrain from eating or drinking anything 4 hours prior to your test. Should you need to cancel or reschedule your appointment, please contact [phone] [facility]) or [phone] [facility]). _____________________________________________________________________ A Modified Barium Swallow Study, or MBS, is a special x-ray that is taken to check swallowing skills. It is carried out by a Marine scientist and a Warehouse manager (SLP). During this test, yourmouth, throat, and esophagus, a muscular tube which connects your mouth to your stomach, is checked. The test will help you, your doctor, and the SLP plan what types of foods and liquids are easier for you to swallow. The SLP will also identify positions and ways to help you swallow more easily and safely. What will happen during an MBS? You will be taken to an x-ray room and seated comfortably. You will be asked to swallow small amounts of food and liquid mixed with barium. Barium is a liquid or paste that allows images of your mouth, throat and esophagus to be seen on x-ray. The x-ray captures moving images of the food you are swallowing as it travels from your mouth through your throat and into your esophagus. This test helps identify whether food or liquid is entering your lungs (aspiration). The test also shows which part of your mouth or throat lacks strength or coordination to move the food or liquid in the right direction. This test typically takes 30 minutes to 1 hour to complete. _______________________________________________________________________ Normal BMI (Body Mass Index-  based on height and weight) is between 19 and 25. Your BMI today is Body mass index is 49.41 kg/m. Marland Kitchen Please consider follow up  regarding your BMI with your Primary Care Provider.  Thank you for choosing me and [facility].  Venita Lick. Pleas Koch., MD., Clementeen Graham

## 2018-01-06 NOTE — Progress Notes (Signed)
History of Present Illness: This is a 62 year old female referred by Susy Frizzle, MD for the evaluation of choking, throat tightness and intermittent solid food dysphagia.  She was scheduled to see Dr. Ardis Hughs in 2016 and I reviewed several notes regarding colonoscopy.  Throat tightness is not associated with meals or swallowing it occurs independent of those events.  Throat tightness is not associated with exertion or movement.  Choking and difficulty swallowing has occurred intermittently with solid foods for a few months.  She has occasional midsternal chest pain that radiates through to her back that is not associated with meals or swallowing.  Chest pain is also not associated with exertion or movement. She has an appointment with her cardiologist tomorrow.  She notes occasional mild diarrhea and occasional nausea that she relates to starting Trulicity.  She relates that Trulicity has worked very well for controlling her diabetes helped her with weight loss,  and these GI symptoms are mild so she does not want to discontinue this medication.  Denies weight loss, abdominal pain, constipation, change in stool caliber, melena, hematochezia, vomiting.     Allergies  Allergen Reactions  . Codeine Anaphylaxis and Hives  . Penicillins Anaphylaxis and Hives  . Food Nausea And Vomiting    Olives   . Januvia [Sitagliptin]     Stomach cramps  . Latex Hives   Outpatient Medications Prior to Visit  Medication Sig Dispense Refill  . ACCU-CHEK AVIVA PLUS test strip CHECK FINGERSTICK BLOOD SUGAR 3 TIMES DAILY FOR FLUCTUATING BLOOD SUGAR 300 each 12  . ACCU-CHEK SOFTCLIX LANCETS lancets Use to monitor FSBS 3x daily for fluctuating FSBS. DX: 250.00 100 each 12  . Alcohol Swabs (B-D SINGLE USE SWABS REGULAR) PADS Use to monitor FSBS 3x daily for fluctuating FSBS. DX: 250.00 300 each 1  . aspirin EC 81 MG tablet Take 81 mg by mouth daily.    . B-D UF III MINI PEN NEEDLES 31G X 5 MM MISC     . Blood  Glucose Calibration (ACCU-CHEK AVIVA) SOLN Use to monitor FSBS 3x daily for fluctuating FSBS. DX: 250.00 3 each 3  . Blood Glucose Monitoring Suppl (ACCU-CHEK AVIVA PLUS) W/DEVICE KIT Use to monitor FSBS 3x daily for fluctuating FSBS. DX: 250.00 1 kit 0  . Dulaglutide (TRULICITY) 1.5 OH/6.0VP SOPN Inject 1.5 mg into the skin once a week. 12 pen 1  . furosemide (LASIX) 20 MG tablet TAKE 1 TABLET EVERY DAY 90 tablet 3  . gabapentin (NEURONTIN) 300 MG capsule TAKE 1 CAPSULE THREE TIMES DAILY 270 capsule 1  . glipiZIDE (GLUCOTROL) 10 MG tablet Take 10 mg by mouth daily before breakfast.    . lisinopril (PRINIVIL,ZESTRIL) 5 MG tablet Take 1 tablet (5 mg total) by mouth daily. 90 tablet 3  . metFORMIN (GLUCOPHAGE) 1000 MG tablet TAKE 1 TABLET TWICE DAILY WITH A MEAL 180 tablet 3  . metoprolol tartrate (LOPRESSOR) 25 MG tablet TAKE 1 TABLET TWICE DAILY 180 tablet 3  . potassium chloride (K-DUR) 10 MEQ tablet Take 1 tablet (10 mEq total) by mouth daily. 90 tablet 3  . pravastatin (PRAVACHOL) 40 MG tablet Take 1 tablet (40 mg total) by mouth daily. 90 tablet 3  . Carboxymethylcellulose Sodium (LUBRICANT EYE DROPS OP) Apply 1 drop to eye as needed (dry eyes or allergies).     Marland Kitchen ibuprofen (ADVIL,MOTRIN) 800 MG tablet Take 1 tablet (800 mg total) by mouth every 8 (eight) hours as needed. 30 tablet 0  .  ondansetron (ZOFRAN) 4 MG tablet TAKE 1 TABLET EVERY 6 HOURS AS NEEDED FOR NAUSEA AND VOMITING 60 tablet 0  . potassium chloride (K-DUR,KLOR-CON) 10 MEQ tablet TAKE 1 TABLET EVERY DAY 90 tablet 3  . traMADol (ULTRAM) 50 MG tablet Take 1 tablet (50 mg total) by mouth every 6 (six) hours as needed. 60 tablet 2   No facility-administered medications prior to visit.    Past Medical History:  Diagnosis Date  . Arthritis    feet & toes  . Bulging disc   . Diabetes mellitus   . History of hepatitis B   . Hyperlipidemia   . Hypertension   . Neuromuscular disorder ([place])    in feet and fingers   . Neuropathy      feet  . 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 Peripheral vascular disease ([place])   . Shortness of breath dyspnea    on lopressor   . Sleep apnea    pt had study done but it was incomplete due to blood sugar issues    Past Surgical History:  Procedure Laterality Date  . HYSTEROSCOPY W/D&C N/A 02/15/2015   Procedure: DILATATION AND CURETTAGE /HYSTEROSCOPY;  Surgeon: Linda Hedges, DO;  Location: [facility];  Service: Gynecology;  Laterality: N/A;  . TONSILLECTOMY    . tyroid biospy     january 2016   Social History   Socioeconomic History  . Marital status: Single    Spouse name: Not on file  . Number of children: 0  . Years of education: Not on file  . Highest education level: Not on file  Occupational History  . Occupation: retired  Scientific laboratory technician  . Financial resource strain: Not on file  . Food insecurity:    Worry: Not on file    Inability: Not on file  . Transportation needs:    Medical: Not on file    Non-medical: Not on file  Tobacco Use  . Smoking status: Never Smoker  . Smokeless tobacco: Never Used  Substance and Sexual Activity  . Alcohol use: No    Alcohol/week: 0.0 oz    Comment: social drinker.  . Drug use: No  . Sexual activity: Not on file  Lifestyle  . Physical activity:    Days per week: Not on file    Minutes per session: Not on file  . Stress: Not on file  Relationships  . Social connections:    Talks on phone: Not on file    Gets together: Not on file    Attends religious service: Not on file    Active member of club or organization: Not on file    Attends meetings of clubs or organizations: Not on file    Relationship status: Not on file  Other Topics Concern  . Not on file  Social History Narrative  . Not on file   Family History  Problem Relation Age of Onset  . Hyperlipidemia Mother   . Heart disease Father   . Hyperlipidemia Father   . Hypertension Father   . Heart disease Brother   . Stroke Brother   . Kidney disease  Sister   . Ovarian cancer Maternal Aunt       Review of Systems: Pertinent positive and negative review of systems were noted in the above HPI section. All other review of systems were otherwise negative.    Physical Exam: General: Well developed, well nourished, no acute distress, walks with a cane, often uses a  wheelchair Head: Normocephalic and atraumatic Eyes:  sclerae anicteric, EOMI Ears: Normal auditory acuity Mouth: No deformity or lesions Neck: Supple, no masses or thyromegaly Lungs: Clear throughout to auscultation Heart: Regular rate and rhythm; no murmurs, rubs or bruits Abdomen: Soft, non tender and non distended. No masses, hepatosplenomegaly or hernias noted. Normal Bowel sounds Rectal: not done Musculoskeletal: Symmetrical with no gross deformities  Skin: No lesions on visible extremities Pulses:  Normal pulses noted Extremities: No clubbing, cyanosis, edema or deformities noted Neurological: Alert oriented x 4, grossly nonfocal Cervical Nodes:  No significant cervical adenopathy Inguinal Nodes: No significant inguinal adenopathy Psychological:  Alert and cooperative. Normal mood and affect  Assessment and Recommendations:  1.  Intermittent solid food dysphasia and intermittent choking when swallowing.  Rule out oroharyngeal dysphasia, esophageal dysphasia. Schedule MBSS and BA esophagram.   2. Intermittent throat tightness, not related to meals or swallowing.  This does not appear to be GI related.  Further evaluation with PCP and cardiology.  3. Intermittent chest pain radiating to the back, not related to meals or swallowing.  No typical heartburn or reflux symptoms.  BA esophagram. Trial of pantoprazole 40 mg every morning for 2 months.  Further evaluation with PCP and cardiology  4. CRC screening, average risk. She cancelled colonoscopy scheduled with Dr. Ardis Hughs in 06/2015 and declined colonoscopy again in 01/2016 per Dr. Samella Parr note. Cologuard was  discussed however I cannot locate a result.  Given her comorbidities she is at higher risk for complications for endoscopic procedures and sedation.  We will further discuss Cologuard versus colonoscopy after her swallowing evaluation and cardiology follow up have been completed.  5. DM with complications.   6. Morbid obesity. BMI=49   cc: Susy Frizzle, [place], [place] [id number]

## 2018-01-07 ENCOUNTER — Encounter: Payer: Self-pay | Admitting: Cardiovascular Disease

## 2018-01-07 ENCOUNTER — Ambulatory Visit: Payer: Medicare HMO | Admitting: Cardiovascular Disease

## 2018-01-07 VITALS — BP 124/81 | HR 73 | Ht 66.0 in | Wt 306.5 lb

## 2018-01-07 DIAGNOSIS — R002 Palpitations: Secondary | ICD-10-CM

## 2018-01-07 DIAGNOSIS — I10 Essential (primary) hypertension: Secondary | ICD-10-CM | POA: Diagnosis not present

## 2018-01-07 DIAGNOSIS — E7800 Pure hypercholesterolemia, unspecified: Secondary | ICD-10-CM | POA: Diagnosis not present

## 2018-01-07 DIAGNOSIS — R079 Chest pain, unspecified: Secondary | ICD-10-CM | POA: Diagnosis not present

## 2018-01-07 DIAGNOSIS — I479 Paroxysmal tachycardia, unspecified: Secondary | ICD-10-CM

## 2018-01-07 DIAGNOSIS — R600 Localized edema: Secondary | ICD-10-CM

## 2018-01-07 MED ORDER — METOPROLOL TARTRATE 50 MG PO TABS: 50.0000 mg | ORAL_TABLET | Freq: Two times a day (BID) | ORAL | 3 refills | Status: DC

## 2018-01-07 NOTE — Patient Instructions (Addendum)
Please call if chest pain continues, especially with exertion   Medication Instructions:   Please increase the metoprolol up to 50 mg twice a day  Labwork:  No new labs needed  Testing/Procedures:  No further testing at this time   Follow-Up: It was a pleasure seeing you in the office today. Please call us if you have new issues that need to be addressed before your next appt.  [phone]  Your physician wants you to follow-up in: 12 months.  You will receive a reminder letter in the mail two months in advance. If you don't receive a letter, please call our office to schedule the follow-up appointment.  If you need a refill on your cardiac medications before your next appointment, please call your pharmacy.  For educational health videos Log in to : [email or web address] Or : [email or web address], password : [email or web address]

## 2018-01-07 NOTE — Progress Notes (Signed)
Cardiology Office Note  Date:  01/07/2018   ID:  [name], DOB 23-Jul-1956, MRN [id number]  PCP:  Susy Frizzle, MD   Chief Complaint  Patient presents with  . other    12 month f/u c/o chest pain radiates to back and pt has had 3 episodes of rapid heart beat/sob. Meds reviewed verbally with pt.    HPI:  [name] is a very pleasant 62 year old woman with  diabetes,  on insulin.  hemoglobin A1c  5.9  Episodes of sinus tachycardia morbid obesity,  hyperlipidemia,  hypertension,  chronic lower extremity swelling,   dizziness.   Neuropathy in her feet Mom with dementia, lives with her She presents for routine followup of her hypertension,  leg swelling, sinus tachycardia  In follow-up today she reports having several issues Trouble swallowing Has a barium swallow scheduled  Rare episodes of Chest pain,  Feels likesomeone inside poking with needle Atypical in nature At rest not with exertion  3 episodes of tachycardia 1st: sitting at a table, "blood was rushing", fluttering, 10 to 15 min 1 1/2 to 2 months ago 2nd: was brushing teeth, 10 min, tachy 3rd: getting ready to go to bed: 5 min That was 2-3 weeks ago  Tolerating her cholesterol medication Total chol 141  Chronic Waxing and waning diarrhea/constipation  Legs are weak, walks with a cane. No regular exercise program  carotid ultrasound showing minimal disease.   EKG personally reviewed by myself on todays visit Shows normal sinus rhythm rate 73 bpm no significant ST-T wave changes  Other past medical history reviewed Holter monitor in the past that showed elevated baseline heart rate with frequent periods of sinus tachycardia. Improvement in dizzy episodes and fluttering with metoprolol.   PMH:   has a past medical history of Arthritis, Bulging disc, Diabetes mellitus, History of hepatitis B, Hyperlipidemia, Hypertension, Neuromuscular disorder ([place]), Neuropathy, Periodic heart flutter, Peripheral  vascular disease ([place]), Shortness of breath dyspnea, and Sleep apnea.  PSH:    Past Surgical History:  Procedure Laterality Date  . HYSTEROSCOPY W/D&C N/A 02/15/2015   Procedure: DILATATION AND CURETTAGE /HYSTEROSCOPY;  Surgeon: Linda Hedges, DO;  Location: [facility];  Service: Gynecology;  Laterality: N/A;  . TONSILLECTOMY    . tyroid biospy     january 2016    Outpatient Encounter Medications as of 01/07/2018  Medication Sig  . ACCU-CHEK AVIVA PLUS test strip CHECK FINGERSTICK BLOOD SUGAR 3 TIMES DAILY FOR FLUCTUATING BLOOD SUGAR  . ACCU-CHEK SOFTCLIX LANCETS lancets Use to monitor FSBS 3x daily for fluctuating FSBS. DX: 250.00  . Alcohol Swabs (B-D SINGLE USE SWABS REGULAR) PADS Use to monitor FSBS 3x daily for fluctuating FSBS. DX: 250.00  . aspirin EC 81 MG tablet Take 81 mg by mouth daily.  . B-D UF III MINI PEN NEEDLES 31G X 5 MM MISC   . Blood Glucose Calibration (ACCU-CHEK AVIVA) SOLN Use to monitor FSBS 3x daily for fluctuating FSBS. DX: 250.00  . Blood Glucose Monitoring Suppl (ACCU-CHEK AVIVA PLUS) W/DEVICE KIT Use to monitor FSBS 3x daily for fluctuating FSBS. DX: 250.00  . Dulaglutide (TRULICITY) 1.5 HM/0.9OB SOPN Inject 1.5 mg into the skin once a week.  . furosemide (LASIX) 20 MG tablet TAKE 1 TABLET EVERY DAY  . gabapentin (NEURONTIN) 300 MG capsule TAKE 1 CAPSULE THREE TIMES DAILY  . glipiZIDE (GLUCOTROL) 10 MG tablet Take 10 mg by mouth daily before breakfast.  . lisinopril (PRINIVIL,ZESTRIL) 5 MG tablet Take 1 tablet (5 mg total) by  mouth daily.  . metFORMIN (GLUCOPHAGE) 1000 MG tablet TAKE 1 TABLET TWICE DAILY WITH A MEAL  . metoprolol tartrate (LOPRESSOR) 25 MG tablet TAKE 1 TABLET TWICE DAILY  . pantoprazole (PROTONIX) 40 MG tablet Take 1 tablet (40 mg total) by mouth daily.  . potassium chloride (K-DUR) 10 MEQ tablet Take 1 tablet (10 mEq total) by mouth daily.  . pravastatin (PRAVACHOL) 40 MG tablet Take 1 tablet (40 mg total) by mouth daily.   No  facility-administered encounter medications on file as of 01/07/2018.    Allergies:   Codeine; Penicillins; Food; Januvia [sitagliptin]; and Latex   Social History:  The patient  reports that she has never smoked. She has never used smokeless tobacco. She reports that she does not drink alcohol or use drugs.   Family History:   family history includes Heart disease in her brother and father; Hyperlipidemia in her father and mother; Hypertension in her father; Kidney disease in her sister; Ovarian cancer in her maternal aunt; Stroke in her brother.    Review of Systems: Review of Systems  Constitutional: Negative.   Respiratory: Positive for shortness of breath.   Cardiovascular: Positive for palpitations and leg swelling.  Gastrointestinal: Negative.   Musculoskeletal: Negative.   Neurological: Negative.   Psychiatric/Behavioral: Negative.   All other systems reviewed and are negative.    PHYSICAL EXAM: VS:  BP 124/81 (BP Location: Left Arm, Patient Position: Sitting, Cuff Size: Large)   Pulse 73   Ht 5' 6"  (1.676 m)   Wt (!) 306 lb 8 oz (139 kg)   BMI 49.47 kg/m  , BMI Body mass index is 49.47 kg/m. Constitutional:  oriented to person, place, and time. No distress. morbidly obese HENT:  Head: Normocephalic and atraumatic.  Eyes:  no discharge. No scleral icterus.  Neck: Normal range of motion. Neck supple. No JVD present.  Cardiovascular: Normal rate, regular rhythm, normal heart sounds and intact distal pulses. Exam reveals no gallop and no friction rub. No edema No murmur heard. Pulmonary/Chest: Effort normal and breath sounds normal. No stridor. No respiratory distress.  no wheezes.  no rales.  no tenderness.  Abdominal: Soft.  no distension.  no tenderness.  Musculoskeletal: Normal range of motion.  no  tenderness or deformity.  Neurological:  normal muscle tone. Coordination normal. No atrophy Skin: Skin is warm and dry. No rash noted. not diaphoretic.  Psychiatric:   normal mood and affect. behavior is normal. Thought content normal.    Recent Labs: 11/12/2017: ALT 18; BUN 19; Creat 1.00; Hemoglobin 13.2; Platelets 139; Potassium 4.5; Sodium 138    Lipid Panel Lab Results  Component Value Date   CHOL 141 11/12/2017   HDL 26 (L) 11/12/2017   LDLCALC 90 11/12/2017   TRIG 151 (H) 11/12/2017      Wt Readings from Last 3 Encounters:  01/07/18 (!) 306 lb 8 oz (139 kg)  01/06/18 (!) 306 lb 2 oz (138.9 kg)  11/17/17 (!) 309 lb (140.2 kg)       ASSESSMENT AND PLAN:  Shortness of breath - Plan: EKG 12-Lead Obesity, deconditioning Recommended regular walking program Long discussion with her, she is going to start water walking at [facility]  Type 2 diabetes mellitus with complication, with long-term current use of insulin ([place]) - Plan: EKG 12-Lead Diabetes numbers much improved Not eating much food, doing well on Victoza  Paroxysmal tachycardia Recommended she increase metoprolol up to 37.5 mill grams twice a day and if blood pressure tolerates would increase  up to 50 twice a day poor rate and rhythm control Having rare episodes Uncertain if we would capture this on 2 week monitor but if symptoms worsen would order even monitor  Essential hypertension - Plan: EKG 12-Lead Will increase beta blocker as above Continue other medications  Pure hypercholesterolemia - Plan: EKG 12-Lead Cholesterol is at goal on the current lipid regimen. No changes to the medications were made.  Localized edema - Plan: EKG 12-Lead Chronic venous stasis Recommended Ace wraps for worsening swelling could consider lymphedema compression pumps  Morbid obesity (HCC) Continue aggressive local carbohydrate diet Walking program Weight slowly coming down  Disposition:   F/U  12 months   Total encounter time more than 25 minutes  Greater than 50% was spent in counseling and coordination of care with the patient    Orders Placed This Encounter  Procedures  .  EKG 12-Lead     Signed, Esmond Plants, M.D., Ph.D. 01/07/2018  [facility] [place], [place]

## 2018-01-08 ENCOUNTER — Other Ambulatory Visit (HOSPITAL_COMMUNITY): Payer: Self-pay | Admitting: Gastroenterology

## 2018-01-08 DIAGNOSIS — R1319 Other dysphagia: Secondary | ICD-10-CM

## 2018-01-12 ENCOUNTER — Ambulatory Visit (HOSPITAL_COMMUNITY)
Admission: RE | Admit: 2018-01-12 | Discharge: 2018-01-12 | Disposition: A | Discharge status: Home / Self Care | Payer: Medicare HMO | Source: Ambulatory Visit | Attending: Gastroenterology | Admitting: Gastroenterology

## 2018-01-12 ENCOUNTER — Ambulatory Visit (HOSPITAL_COMMUNITY): Payer: Medicare HMO

## 2018-01-12 DIAGNOSIS — R0989 Other specified symptoms and signs involving the circulatory and respiratory systems: Secondary | ICD-10-CM | POA: Diagnosis not present

## 2018-01-12 DIAGNOSIS — R1319 Other dysphagia: Secondary | ICD-10-CM | POA: Diagnosis not present

## 2018-01-12 DIAGNOSIS — R933 Abnormal findings on diagnostic imaging of other parts of digestive tract: Secondary | ICD-10-CM | POA: Diagnosis not present

## 2018-01-12 DIAGNOSIS — R079 Chest pain, unspecified: Secondary | ICD-10-CM | POA: Insufficient documentation

## 2018-01-12 DIAGNOSIS — R1310 Dysphagia, unspecified: Secondary | ICD-10-CM | POA: Insufficient documentation

## 2018-01-22 ENCOUNTER — Telehealth: Payer: Self-pay | Admitting: Family Medicine

## 2018-01-22 NOTE — Telephone Encounter (Signed)
Patient called left vm asking for a prescription for prednisone for 3 days called into [facility] on [address] She states that in the past Dr. Tanya Nones will call this in for back pain. She has been taking her tramadol for the past 4 days for the back pain but would like the prednisone called in to get a jump on the back pain before she can't move.  CB# [phone]

## 2018-01-22 NOTE — Telephone Encounter (Signed)
MD please advise

## 2018-01-23 ENCOUNTER — Other Ambulatory Visit: Payer: Self-pay | Admitting: Family Medicine

## 2018-01-23 MED ORDER — PREDNISONE 20 MG PO TABS: ORAL_TABLET | ORAL | 0 refills | Status: DC

## 2018-01-23 NOTE — Telephone Encounter (Signed)
rx was sent to [facility].  If no better, NTBS

## 2018-01-27 ENCOUNTER — Ambulatory Visit (AMBULATORY_SURGERY_CENTER): Payer: Self-pay

## 2018-01-27 VITALS — Ht 66.0 in | Wt 311.0 lb

## 2018-01-27 DIAGNOSIS — R1310 Dysphagia, unspecified: Secondary | ICD-10-CM

## 2018-01-27 NOTE — Progress Notes (Signed)
Pt came into the office today for her PV prior to her endoscopy on 02/06/18.  Due to her BMI greater than  50, informed pt she would need to be scheduled at the hospital. After reviewing pt's medical history, the pt states she has a feeling in her throat that someone is choking her and hard to get her breath. No airway problems per pt.  Discussed with pt, she should be evaluated in the office for an colon with her endo and why she has this choking feeling. Pt has a fear of an perforation of her colon, and has never had a colon done, due to 3 family members that have died after an colonoscopy. Pt was scheduled for an OV with Amy-Pa on 06/14 to eval for colon and any airway difficulties. Pt will need to be scheduled at hospital, she understood. Endo on 02/06/18 was cancelled.

## 2018-01-29 ENCOUNTER — Other Ambulatory Visit: Payer: Self-pay | Admitting: Family Medicine

## 2018-02-04 ENCOUNTER — Other Ambulatory Visit: Payer: Self-pay | Admitting: Family Medicine

## 2018-02-06 ENCOUNTER — Other Ambulatory Visit: Payer: Self-pay | Admitting: Family Medicine

## 2018-02-06 ENCOUNTER — Encounter: Payer: Medicare HMO | Admitting: Gastroenterology

## 2018-02-06 MED ORDER — TRAMADOL HCL 50 MG PO TABS: 50.0000 mg | ORAL_TABLET | Freq: Four times a day (QID) | ORAL | 2 refills | Status: DC | PRN

## 2018-02-06 NOTE — Telephone Encounter (Signed)
Pt called and requested a refill on her Tramadol - she only takes as needed for her back pain.  LOV: 11/17/17  LRF: 07/22/16

## 2018-02-10 ENCOUNTER — Other Ambulatory Visit: Payer: Self-pay | Admitting: *Deleted

## 2018-02-10 MED ORDER — TRUEPLUS LANCETS 30G MISC: 3 refills | Status: DC

## 2018-02-10 MED ORDER — TRUE METRIX LEVEL 2 NORMAL VI SOLN: 1 refills | Status: AC

## 2018-02-10 MED ORDER — TRUE METRIX AIR GLUCOSE METER DEVI: 1.0000 | Freq: Every day | 1 refills | Status: DC

## 2018-02-10 MED ORDER — GLUCOSE BLOOD VI STRP: ORAL_STRIP | 3 refills | Status: DC

## 2018-02-13 ENCOUNTER — Encounter (INDEPENDENT_AMBULATORY_CARE_PROVIDER_SITE_OTHER): Payer: Self-pay

## 2018-02-13 ENCOUNTER — Encounter: Payer: Self-pay | Admitting: Physician Assistant

## 2018-02-13 ENCOUNTER — Ambulatory Visit: Payer: Medicare HMO | Admitting: Physician Assistant

## 2018-02-13 VITALS — BP 102/64 | HR 79 | Ht 66.0 in | Wt 314.0 lb

## 2018-02-13 DIAGNOSIS — R1310 Dysphagia, unspecified: Secondary | ICD-10-CM

## 2018-02-13 DIAGNOSIS — R933 Abnormal findings on diagnostic imaging of other parts of digestive tract: Secondary | ICD-10-CM | POA: Diagnosis not present

## 2018-02-13 DIAGNOSIS — Z1211 Encounter for screening for malignant neoplasm of colon: Secondary | ICD-10-CM | POA: Diagnosis not present

## 2018-02-13 MED ORDER — NA SULFATE-K SULFATE-MG SULF 17.5-3.13-1.6 GM/177ML PO SOLN: 1.0000 | Freq: Once | ORAL | 0 refills | Status: AC

## 2018-02-13 NOTE — Progress Notes (Signed)
Subjective:    Patient ID: Linda Briggs, female    DOB: Apr 03, 1956, 62 y.o.   MRN: [id number]  HPI Linda Briggs is a pleasant 62 year old white female, known to Dr. Fuller Plan who comes in today for follow-up and to discuss scheduling colonoscopy and EGD.  Patient was last seen in May 2019 by Dr. Fuller Plan at that time with complaints of dysphasia.  She underwent barium swallow on 01/12/2018 which showed disruption of the primary peristaltic waves in 3 out of 4 swallows consistent with a nonspecific esophageal dysmotility, there was a probable thickened fold noted in the distal esophagus with no irregularity noted, and no stricture noted.  It is Dr. Fuller Plan recommendation that she have a EGD. Patient has been taking Protonix 40 mg p.o. daily, she says that has not really changed her symptoms much.  Primarily she has a sensation of tightness like she is being choked has been happening intermittently over the past couple of months.  She has had increase in frequency.  She says the symptoms last for 30 seconds to a minute.  She says this gives her a sensation of shortness of breath or air hunger and has occurred at times when she needs to try to speak and says she has to wait for the symptoms to ease up.  At this point she is having some symptoms almost every day.  Symptoms are not related to eating.  She had also been having some midsternal chest pain radiating into her back that had not been associated with meals.  When she is actually eating it does not appear that she is having dysphagia or odynophagia. She had recent follow-up with her cardiologist for complaints of intermittent episodes of rapid pounding heart.  Her metoprolol was increased.  She does not feel that these episodes are at all related to the episodes of choking sensation. Patient has not had any prior colon screening.  She said she had been afraid to have a colonoscopy in the past because she has had a couple of different family members who is have had  complications of bowel perforations and subsequently died.  1 of these appears to have been related indirectly to a colonoscopy. He has no current lower GI symptoms, specifically no complaints of ongoing abdominal pain changes in bowel habits melena or hematochezia. She is leaning towards proceeding with colonoscopy at this point. She was sent what sounds like a FIT test from her insurance company which she has not completed, she has not done a Cologuard.  Review of Systems Pertinent positive and negative review of systems were noted in the above HPI section.  All other review of systems was otherwise negative.  Outpatient Encounter Medications as of 02/13/2018  Medication Sig  . Alcohol Swabs (B-D SINGLE USE SWABS REGULAR) PADS Use to monitor FSBS 3x daily for fluctuating FSBS. DX: 250.00  . aspirin EC 81 MG tablet Take 81 mg by mouth daily.  . B-D UF III MINI PEN NEEDLES 31G X 5 MM MISC   . Blood Glucose Calibration (TRUE METRIX LEVEL 2) Normal SOLN Use as directed to monitor FSBS 3x daily. Dx: E11.65  . Blood Glucose Monitoring Suppl (TRUE METRIX AIR GLUCOSE METER) DEVI 1 each by Does not apply route daily. Use as directed to monitor FSBS 3x daily. Dx: E11.65  . furosemide (LASIX) 20 MG tablet TAKE 1 TABLET EVERY DAY  . gabapentin (NEURONTIN) 300 MG capsule TAKE 1 CAPSULE THREE TIMES DAILY  . glipiZIDE (GLUCOTROL) 10 MG tablet Take  10 mg by mouth daily before breakfast.  . glucose blood (TRUE METRIX BLOOD GLUCOSE TEST) test strip Use as directed to monitor FSBS 3x daily. Dx: E11.65  . lisinopril (PRINIVIL,ZESTRIL) 5 MG tablet Take 1 tablet (5 mg total) by mouth daily.  . metFORMIN (GLUCOPHAGE) 1000 MG tablet TAKE 1 TABLET TWICE DAILY WITH A MEAL  . metoprolol tartrate (LOPRESSOR) 50 MG tablet Take 1 tablet (50 mg total) by mouth 2 (two) times daily.  . Na Sulfate-K Sulfate-Mg Sulf 17.5-3.13-1.6 GM/177ML SOLN Take 1 kit by mouth once for 1 dose.  . pantoprazole (PROTONIX) 40 MG tablet Take 1  tablet (40 mg total) by mouth daily.  . potassium chloride (K-DUR) 10 MEQ tablet Take 1 tablet (10 mEq total) by mouth daily.  . pravastatin (PRAVACHOL) 40 MG tablet Take 1 tablet (40 mg total) by mouth daily.  . predniSONE (DELTASONE) 20 MG tablet 3 tabs poqday 1-2, 2 tabs poqday 3-4, 1 tab poqday 5-6  . traMADol (ULTRAM) 50 MG tablet Take 1 tablet (50 mg total) by mouth every 6 (six) hours as needed (G89.4 Chronic back pain).  . TRUEPLUS LANCETS 30G MISC Use as directed to monitor FSBS 3x daily. Dx: Z61.09  . TRULICITY 1.5 UE/4.5WU SOPN INJECT  1.5MG  INTO  THE  SKIN EVERY WEEK   No facility-administered encounter medications on file as of 02/13/2018.    Allergies  Allergen Reactions  . Codeine Anaphylaxis and Hives  . Penicillins Anaphylaxis and Hives  . Food Nausea And Vomiting    Olives   . Januvia [Sitagliptin]     Stomach cramps  . Latex Hives   Patient Active Problem List   Diagnosis Date Noted  . Paroxysmal tachycardia ([place]) 01/07/2018  . Morbid obesity ([place]) 01/13/2017  . Pure hypercholesterolemia 01/11/2017  . Localized edema 01/11/2017  . Diabetic retinopathy ([place]) 05/29/2016  . Abdominal pain 03/28/2014  . Type 2 diabetes mellitus with complications ([place]) 98/07/9146  . Weakness 07/26/2013  . Hyperlipidemia   . Essential hypertension   . Shortness of breath 03/27/2012  . Edema 03/27/2012  . Near syncope 03/27/2012  . Palpitations 03/27/2012   Social History   Socioeconomic History  . Marital status: Single    Spouse name: Not on file  . Number of children: 0  . Years of education: Not on file  . Highest education level: Not on file  Occupational History  . Occupation: retired  Scientific laboratory technician  . Financial resource strain: Not on file  . Food insecurity:    Worry: Not on file    Inability: Not on file  . Transportation needs:    Medical: Not on file    Non-medical: Not on file  Tobacco Use  . Smoking status: Never Smoker  . Smokeless tobacco: Never Used   Substance and Sexual Activity  . Alcohol use: No    Comment: social drinker.  . Drug use: No  . Sexual activity: Not on file  Lifestyle  . Physical activity:    Days per week: Not on file    Minutes per session: Not on file  . Stress: Not on file  Relationships  . Social connections:    Talks on phone: Not on file    Gets together: Not on file    Attends religious service: Not on file    Active member of club or organization: Not on file    Attends meetings of clubs or organizations: Not on file    Relationship status: Not on file  .  Intimate partner violence:    Fear of current or ex partner: Not on file    Emotionally abused: Not on file    Physically abused: Not on file    Forced sexual activity: Not on file  Other Topics Concern  . Not on file  Social History Narrative  . Not on file    Ms. Mandile's family history includes Heart disease in her brother and father; Hyperlipidemia in her father and mother; Hypertension in her father; Kidney disease in her sister; Ovarian cancer in her maternal aunt; Stroke in her brother.      Objective:    Vitals:   02/13/18 1106  BP: 102/64  Pulse: 79    Physical Exam; well-developed older white female in no acute distress, pleasant blood pressure 102/64 pulse 79, height 5 foot 6, weight 314, BMI 50.6.  HEENT ;nontraumatic normocephalic EOMI PERRLA sclera anicteric, Cardiovascular; regular rate and rhythm with S1-S2 no murmur rub or gallop, Pulmonary ;clear bilaterally, Abdomen; morbidly obese, soft no palpable mass or hepatosplenomegaly bowel sounds are present, Rectal ;exam not done, Extremities; no clubbing cyanosis or edema skin warm dry, Neuro psych; alert and oriented, grossly nonfocal mood and affect appropriate       Assessment & Plan:   #91 62 year old white female, morbidly obese with BMI of 50.6 with recent abnormal barium swallow showing a probable thickened fold in the distal esophagus, no definite stricture and she was  also noted to have nonspecific esophageal dysmotility.  Rule out occult esophageal lesion, esophagitis, Barrett's  #2   2 month history of intermittent choking sensation which she describes as like someone "having their hands around your  Neck."  Etiology is not clear.  I do not think this is reflux related, and very unlikely to be related to  nonspecific motility disorder.  ?spasm ? Pharyngeal spasm /Larygeal spasm  #3 colon cancer surveillance-patient has not had any prior screening #4 adult onset diabetes mellitus #5.  Hypertension  Plan; Continue Protonix 40 mg p.o. every morning for now We will refer patient to ENT for laryngoscopy and evaluation Patient will be scheduled for upper endoscopy and colonoscopy with Dr. Fuller Plan.  Both procedures were discussed in detail with patient including indications risks and benefits and she is agreeable to proceed. Procedures will be scheduled at [facility] due to BMI greater than 50.     Idan Prime Genia Harold PA-C 02/13/2018   Cc: Susy Frizzle, MD

## 2018-02-13 NOTE — Patient Instructions (Addendum)
You have been scheduled for an endoscopy and colonoscopy. Please follow the written instructions given to you at your visit today. Please pick up your prep supplies at the pharmacy within the next 1-3 days. If you use inhalers (even only as needed), please bring them with you on the day of your procedure. Your physician has requested that you go to www.startemmi.com and enter the access code given to you at your visit today. This web site gives a general overview about your procedure. However, you should still follow specific instructions given to you by our office regarding your preparation for the procedure.  [facility] on 02/24/18 at 2:00pm with Dr. Jenne PaneBates. Please bring your co-pay and insurance cards with you to your appointment.    [facility] Address: [address], [place], [place] [address]  Phone: [phone]

## 2018-02-15 NOTE — Progress Notes (Signed)
Reviewed and agree with management plan.  Dhyana Bastone T. Kahli Mayon, MD FACG 

## 2018-02-23 DIAGNOSIS — H2513 Age-related nuclear cataract, bilateral: Secondary | ICD-10-CM | POA: Diagnosis not present

## 2018-02-23 DIAGNOSIS — E113293 Type 2 diabetes mellitus with mild nonproliferative diabetic retinopathy without macular edema, bilateral: Secondary | ICD-10-CM | POA: Diagnosis not present

## 2018-02-23 DIAGNOSIS — H25013 Cortical age-related cataract, bilateral: Secondary | ICD-10-CM | POA: Diagnosis not present

## 2018-02-23 DIAGNOSIS — H40013 Open angle with borderline findings, low risk, bilateral: Secondary | ICD-10-CM | POA: Diagnosis not present

## 2018-02-23 LAB — HM DIABETES EYE EXAM

## 2018-02-24 DIAGNOSIS — R0989 Other specified symptoms and signs involving the circulatory and respiratory systems: Secondary | ICD-10-CM | POA: Insufficient documentation

## 2018-02-24 DIAGNOSIS — K219 Gastro-esophageal reflux disease without esophagitis: Secondary | ICD-10-CM | POA: Insufficient documentation

## 2018-02-24 DIAGNOSIS — E0789 Other specified disorders of thyroid: Secondary | ICD-10-CM | POA: Diagnosis not present

## 2018-02-24 DIAGNOSIS — J343 Hypertrophy of nasal turbinates: Secondary | ICD-10-CM | POA: Diagnosis not present

## 2018-02-25 ENCOUNTER — Encounter: Payer: Self-pay | Admitting: *Deleted

## 2018-02-27 ENCOUNTER — Encounter: Payer: Self-pay | Admitting: *Deleted

## 2018-03-03 ENCOUNTER — Encounter: Payer: Self-pay | Admitting: Family Medicine

## 2018-03-03 DIAGNOSIS — E113299 Type 2 diabetes mellitus with mild nonproliferative diabetic retinopathy without macular edema, unspecified eye: Secondary | ICD-10-CM | POA: Insufficient documentation

## 2018-03-11 ENCOUNTER — Other Ambulatory Visit: Payer: Self-pay | Admitting: Gastroenterology

## 2018-04-02 ENCOUNTER — Other Ambulatory Visit: Payer: Self-pay | Admitting: *Deleted

## 2018-04-02 MED ORDER — PRAVASTATIN SODIUM 40 MG PO TABS: 40.0000 mg | ORAL_TABLET | Freq: Every day | ORAL | 3 refills | Status: DC

## 2018-04-10 ENCOUNTER — Telehealth: Payer: Self-pay | Admitting: Physician Assistant

## 2018-04-10 NOTE — Telephone Encounter (Signed)
Colon cancelled per pt request and prep reviewed with pt for EGD.

## 2018-04-10 NOTE — Telephone Encounter (Signed)
Patient states she wants to canceled her colonoscopy at the hosp on 8.27.19 but she wants to still do the endo. Patient wants to know if the date or instructions will change. Patient seen for ov by APP Amy 6.14.19

## 2018-04-28 ENCOUNTER — Other Ambulatory Visit: Payer: Self-pay

## 2018-04-28 ENCOUNTER — Encounter (HOSPITAL_COMMUNITY): Payer: Self-pay

## 2018-04-28 ENCOUNTER — Ambulatory Visit (HOSPITAL_COMMUNITY): Payer: Medicare HMO | Admitting: Anesthesiology

## 2018-04-28 ENCOUNTER — Encounter (HOSPITAL_COMMUNITY)
Admission: RE | Disposition: A | Discharge status: Home / Self Care | Payer: Self-pay | Source: Ambulatory Visit | Attending: Gastroenterology

## 2018-04-28 ENCOUNTER — Ambulatory Visit (HOSPITAL_COMMUNITY)
Admission: RE | Admit: 2018-04-28 | Discharge: 2018-04-28 | Disposition: A | Discharge status: Home / Self Care | Payer: Medicare HMO | Source: Ambulatory Visit | Attending: Gastroenterology | Admitting: Gastroenterology

## 2018-04-28 DIAGNOSIS — Z91040 Latex allergy status: Secondary | ICD-10-CM | POA: Diagnosis not present

## 2018-04-28 DIAGNOSIS — Z7984 Long term (current) use of oral hypoglycemic drugs: Secondary | ICD-10-CM | POA: Diagnosis not present

## 2018-04-28 DIAGNOSIS — Z888 Allergy status to other drugs, medicaments and biological substances status: Secondary | ICD-10-CM | POA: Diagnosis not present

## 2018-04-28 DIAGNOSIS — Z880 Allergy status to penicillin: Secondary | ICD-10-CM | POA: Insufficient documentation

## 2018-04-28 DIAGNOSIS — Z79899 Other long term (current) drug therapy: Secondary | ICD-10-CM | POA: Diagnosis not present

## 2018-04-28 DIAGNOSIS — E785 Hyperlipidemia, unspecified: Secondary | ICD-10-CM | POA: Diagnosis not present

## 2018-04-28 DIAGNOSIS — R933 Abnormal findings on diagnostic imaging of other parts of digestive tract: Secondary | ICD-10-CM | POA: Diagnosis not present

## 2018-04-28 DIAGNOSIS — Z6841 Body Mass Index (BMI) 40.0 and over, adult: Secondary | ICD-10-CM | POA: Diagnosis not present

## 2018-04-28 DIAGNOSIS — E11319 Type 2 diabetes mellitus with unspecified diabetic retinopathy without macular edema: Secondary | ICD-10-CM | POA: Insufficient documentation

## 2018-04-28 DIAGNOSIS — R0989 Other specified symptoms and signs involving the circulatory and respiratory systems: Secondary | ICD-10-CM | POA: Insufficient documentation

## 2018-04-28 DIAGNOSIS — E7800 Pure hypercholesterolemia, unspecified: Secondary | ICD-10-CM | POA: Diagnosis not present

## 2018-04-28 DIAGNOSIS — G4730 Sleep apnea, unspecified: Secondary | ICD-10-CM | POA: Diagnosis not present

## 2018-04-28 DIAGNOSIS — Z1211 Encounter for screening for malignant neoplasm of colon: Secondary | ICD-10-CM

## 2018-04-28 DIAGNOSIS — I10 Essential (primary) hypertension: Secondary | ICD-10-CM | POA: Diagnosis not present

## 2018-04-28 DIAGNOSIS — R0789 Other chest pain: Secondary | ICD-10-CM | POA: Diagnosis not present

## 2018-04-28 DIAGNOSIS — K228 Other specified diseases of esophagus: Secondary | ICD-10-CM

## 2018-04-28 DIAGNOSIS — R1310 Dysphagia, unspecified: Secondary | ICD-10-CM

## 2018-04-28 DIAGNOSIS — Z885 Allergy status to narcotic agent status: Secondary | ICD-10-CM | POA: Insufficient documentation

## 2018-04-28 HISTORY — PX: BIOPSY: SHX5522

## 2018-04-28 HISTORY — PX: ESOPHAGOGASTRODUODENOSCOPY (EGD) WITH PROPOFOL: SHX5813

## 2018-04-28 LAB — GLUCOSE, CAPILLARY: GLUCOSE-CAPILLARY: 160 mg/dL — AB (ref 70–99)

## 2018-04-28 SURGERY — ESOPHAGOGASTRODUODENOSCOPY (EGD) WITH PROPOFOL
Anesthesia: Monitor Anesthesia Care

## 2018-04-28 MED ORDER — LIDOCAINE 2% (20 MG/ML) 5 ML SYRINGE
INTRAMUSCULAR | Status: DC | PRN
  Administered 2018-04-28: 100 mg via INTRAVENOUS

## 2018-04-28 MED ORDER — PROPOFOL 500 MG/50ML IV EMUL
INTRAVENOUS | Status: DC | PRN
  Administered 2018-04-28: 125 ug/kg/min via INTRAVENOUS

## 2018-04-28 MED ORDER — PROPOFOL 10 MG/ML IV BOLUS
INTRAVENOUS | Status: DC | PRN
  Administered 2018-04-28: 50 mg via INTRAVENOUS

## 2018-04-28 MED ORDER — LACTATED RINGERS IV SOLN
INTRAVENOUS | Status: DC
  Administered 2018-04-28: 1000 mL via INTRAVENOUS

## 2018-04-28 MED ORDER — PROPOFOL 10 MG/ML IV BOLUS
INTRAVENOUS | Status: AC
  Filled 2018-04-28: qty 20

## 2018-04-28 MED ORDER — SODIUM CHLORIDE 0.9 % IV SOLN: INTRAVENOUS | Status: DC

## 2018-04-28 SURGICAL SUPPLY — 15 items

## 2018-04-28 NOTE — Op Note (Signed)
[facility] Patient Name: Linda KassCynthia Briggs Procedure Date: 04/28/2018 MRN: [id number] Attending MD: Meryl DareMalcolm T Brason Berthelot , MD Date of Birth: 08/23/1956 CSN: [id number] Age: [age] Admit Type: Outpatient Procedure:                Upper GI endoscopy Indications:              Suspected gastroesophageal reflux disease, Abnormal                            barium esophagram Providers:                Venita LickMalcolm T. Russella DarStark, MD, Roselie AwkwardShannon Love, RN, Harrington ChallengerHope                            Parker, Technician, Judeth CornfieldStephanie [place], CRNA Referring MD:             Priscille HeidelbergWarren T. Tanya NonesPickard, MD Medicines:                Monitored Anesthesia Care Complications:            No immediate complications. Estimated Blood Loss:     Estimated blood loss was minimal. Procedure:                Pre-Anesthesia Assessment:                           - Prior to the procedure, a History and Physical                            was performed, and patient medications and                            allergies were reviewed. The patient's tolerance of                            previous anesthesia was also reviewed. The risks                            and benefits of the procedure and the sedation                            options and risks were discussed with the patient.                            All questions were answered, and informed consent                            was obtained. Prior Anticoagulants: The patient has                            taken no previous anticoagulant or antiplatelet                            agents. ASA Grade Assessment: III - A patient with  severe systemic disease. After reviewing the risks                            and benefits, the patient was deemed in                            satisfactory condition to undergo the procedure.                           After obtaining informed consent, the endoscope was                            passed under direct vision. Throughout the                           procedure, the patient's blood pressure, pulse, and                            oxygen saturations were monitored continuously. The                            [id number]-H190 ([id number]) Olympus adult endoscope was                            introduced through the mouth, and advanced to the                            second part of duodenum. The upper GI endoscopy was                            accomplished without difficulty. The patient                            tolerated the procedure well. Scope In: Scope Out: Findings:      The Z-line was variable and was found at the gastroesophageal junction.       Biopsies were taken with a cold forceps for histology.      The exam of the esophagus was otherwise normal.      A large amount of food (residue) was found in the gastric fundus, on the       greater curvature of the stomach and in the gastric antrum so these       areas were not adequately visualized.      The exam of the stomach was otherwise normal however limited.      The duodenal bulb and second portion of the duodenum were normal. Impression:               - Z-line variable, at the gastroesophageal                            junction. Biopsied.                           - A large amount of food (residue) in the stomach  so several areas were not adequately visualized.                           - Normal duodenal bulb and second portion of the                            duodenum. Moderate Sedation:      N/A- Per Anesthesia Care Recommendation:           - Patient has a contact number available for                            emergencies. The signs and symptoms of potential                            delayed complications were discussed with the                            patient. Return to normal activities tomorrow.                            Written discharge instructions were provided to the                            patient.                            - Resume previous diet.                           - Continue present medications.                           - Await pathology results.                           - Schedule gastric emptying study at next available                            appointment. Procedure Code(s):        --- Professional ---                           [id number], Esophagogastroduodenoscopy, flexible,                            transoral; with biopsy, single or multiple Diagnosis Code(s):        --- Professional ---                           K22.8, Other specified diseases of esophagus                           R93.3, Abnormal findings on diagnostic imaging of                            other parts of digestive tract CPT copyright  2017 American Medical Association. All rights reserved. The codes documented in this report are preliminary and upon coder review may  be revised to meet current compliance requirements. Meryl Dare, MD 04/28/2018 9:15:06 AM This report has been signed electronically. Number of Addenda: 0

## 2018-04-28 NOTE — Anesthesia Preprocedure Evaluation (Signed)
Anesthesia Evaluation  Patient identified by MRN, date of birth, ID band Patient awake    Reviewed: Allergy & Precautions, NPO status , Patient's Chart, lab work & pertinent test results  Airway Mallampati: III  TM Distance: <3 FB Neck ROM: Full    Dental no notable dental hx.    Pulmonary sleep apnea ,    breath sounds clear to auscultation + decreased breath sounds      Cardiovascular hypertension, + Peripheral Vascular Disease  Normal cardiovascular exam Rhythm:Regular Rate:Normal     Neuro/Psych negative neurological ROS  negative psych ROS   GI/Hepatic negative GI ROS, Neg liver ROS,   Endo/Other  diabetesMorbid obesity  Renal/GU negative Renal ROS  negative genitourinary   Musculoskeletal negative musculoskeletal ROS (+)   Abdominal   Peds negative pediatric ROS (+)  Hematology negative hematology ROS (+)   Anesthesia Other Findings   Reproductive/Obstetrics negative OB ROS                             Anesthesia Physical Anesthesia Plan  ASA: IV  Anesthesia Plan: MAC   Post-op Pain Management:    Induction: Intravenous  PONV Risk Score and Plan: 0  Airway Management Planned: Simple Face Mask  Additional Equipment:   Intra-op Plan:   Post-operative Plan:   Informed Consent: I have reviewed the patients History and Physical, chart, labs and discussed the procedure including the risks, benefits and alternatives for the proposed anesthesia with the patient or authorized representative who has indicated his/her understanding and acceptance.   Dental advisory given  Plan Discussed with: CRNA and Surgeon  Anesthesia Plan Comments:         Anesthesia Quick Evaluation

## 2018-04-28 NOTE — Transfer of Care (Signed)
Immediate Anesthesia Transfer of Care Note  Patient: STALEY BUDZINSKI  Procedure(s) Performed: ESOPHAGOGASTRODUODENOSCOPY (EGD) WITH PROPOFOL (N/A ) BIOPSY  Patient Location: endoscopy   Anesthesia Type:MAC  Level of Consciousness: awake, alert  and oriented  Airway & Oxygen Therapy: Patient Spontanous Breathing  Post-op Assessment: Report given to RN and Post -op Vital signs reviewed and stable  Post vital signs: Reviewed and stable  Last Vitals:  Vitals Value Taken Time  BP    Temp    Pulse 85 04/28/2018  9:10 AM  Resp 19 04/28/2018  9:10 AM  SpO2 94 % 04/28/2018  9:10 AM  Vitals shown include unvalidated device data.  Last Pain:  Vitals:   04/28/18 0836  TempSrc: Oral  PainSc: 0-No pain         Complications: No apparent anesthesia complications

## 2018-04-28 NOTE — Discharge Instructions (Signed)
YOU HAD AN ENDOSCOPIC PROCEDURE TODAY: Refer to the procedure report and other information in the discharge instructions given to you for any specific questions about what was found during the examination. If this information does not answer your questions, please call [place] office at [phone] to clarify.   YOU SHOULD EXPECT: Some feelings of bloating in the abdomen. Passage of more gas than usual. Walking can help get rid of the air that was put into your GI tract during the procedure and reduce the bloating. If you had a lower endoscopy (such as a colonoscopy or flexible sigmoidoscopy) you may notice spotting of blood in your stool or on the toilet paper. Some abdominal soreness may be present for a day or two, also.  DIET: Your first meal following the procedure should be a light meal and then it is ok to progress to your normal diet. A half-sandwich or bowl of soup is an example of a good first meal. Heavy or fried foods are harder to digest and may make you feel nauseous or bloated. Drink plenty of fluids but you should avoid alcoholic beverages for 24 hours. If you had a esophageal dilation, please see attached instructions for diet.    ACTIVITY: Your care partner should take you home directly after the procedure. You should plan to take it easy, moving slowly for the rest of the day. You can resume normal activity the day after the procedure however YOU SHOULD NOT DRIVE, use power tools, machinery or perform tasks that involve climbing or major physical exertion for 24 hours (because of the sedation medicines used during the test).   SYMPTOMS TO REPORT IMMEDIATELY: A gastroenterologist can be reached at any hour. Please call [phone]  for any of the following symptoms:   Following upper endoscopy (EGD, EUS, ERCP, esophageal dilation) Vomiting of blood or coffee ground material  New, significant abdominal pain  New, significant chest pain or pain under the shoulder blades  Painful or  persistently difficult swallowing  New shortness of breath  Black, tarry-looking or red, bloody stools  FOLLOW UP:  If any biopsies were taken you will be contacted by phone or by letter within the next 1-3 weeks. Call [phone]  if you have not heard about the biopsies in 3 weeks.  Please also call with any specific questions about appointments or follow up tests.  

## 2018-04-28 NOTE — H&P (Signed)
HPI Linda Briggs is a pleasant 62 year old white female, known to Dr. Fuller Plan who comes in today for follow-up and to discuss scheduling colonoscopy and EGD.  Patient was last seen in May 2019 by Dr. Fuller Plan at that time with complaints of dysphasia.  She underwent barium swallow on 01/12/2018 which showed disruption of the primary peristaltic waves in 3 out of 4 swallows consistent with a nonspecific esophageal dysmotility, there was a probable thickened fold noted in the distal esophagus with no irregularity noted, and no stricture noted.  It is Dr. Fuller Plan recommendation that she have a EGD. Patient has been taking Protonix 40 mg p.o. daily, she says that has not really changed her symptoms much.  Primarily she has a sensation of tightness like she is being choked has been happening intermittently over the past couple of months.  She has had increase in frequency.  She says the symptoms last for 30 seconds to a minute.  She says this gives her a sensation of shortness of breath or air hunger and has occurred at times when she needs to try to speak and says she has to wait for the symptoms to ease up.  At this point she is having some symptoms almost every day.  Symptoms are not related to eating.  She had also been having some midsternal chest pain radiating into her back that had not been associated with meals.  When she is actually eating it does not appear that she is having dysphagia or odynophagia. She had recent follow-up with her cardiologist for complaints of intermittent episodes of rapid pounding heart.  Her metoprolol was increased.  She does not feel that these episodes are at all related to the episodes of choking sensation. Patient has not had any prior colon screening.  She said she had been afraid to have a colonoscopy in the past because she has had a couple of different family members who is have had complications of bowel perforations and subsequently died.  1 of these appears to have been related  indirectly to a colonoscopy. He has no current lower GI symptoms, specifically no complaints of ongoing abdominal pain changes in bowel habits melena or hematochezia. She is leaning towards proceeding with colonoscopy at this point. She was sent what sounds like a FIT test from her insurance company which she has not completed. She has not done a Cologuard.  Review of Systems Pertinent positive and negative review of systems were noted in the above HPI section.  All other review of systems was otherwise negative.      Outpatient Encounter Medications as of 02/13/2018  Medication Sig  . Alcohol Swabs (B-D SINGLE USE SWABS REGULAR) PADS Use to monitor FSBS 3x daily for fluctuating FSBS. DX: 250.00  . aspirin EC 81 MG tablet Take 81 mg by mouth daily.  . B-D UF III MINI PEN NEEDLES 31G X 5 MM MISC   . Blood Glucose Calibration (TRUE METRIX LEVEL 2) Normal SOLN Use as directed to monitor FSBS 3x daily. Dx: E11.65  . Blood Glucose Monitoring Suppl (TRUE METRIX AIR GLUCOSE METER) DEVI 1 each by Does not apply route daily. Use as directed to monitor FSBS 3x daily. Dx: E11.65  . furosemide (LASIX) 20 MG tablet TAKE 1 TABLET EVERY DAY  . gabapentin (NEURONTIN) 300 MG capsule TAKE 1 CAPSULE THREE TIMES DAILY  . glipiZIDE (GLUCOTROL) 10 MG tablet Take 10 mg by mouth daily before breakfast.  . glucose blood (TRUE METRIX BLOOD GLUCOSE TEST) test strip Use  as directed to monitor FSBS 3x daily. Dx: E11.65  . lisinopril (PRINIVIL,ZESTRIL) 5 MG tablet Take 1 tablet (5 mg total) by mouth daily.  . metFORMIN (GLUCOPHAGE) 1000 MG tablet TAKE 1 TABLET TWICE DAILY WITH A MEAL  . metoprolol tartrate (LOPRESSOR) 50 MG tablet Take 1 tablet (50 mg total) by mouth 2 (two) times daily.  . Na Sulfate-K Sulfate-Mg Sulf 17.5-3.13-1.6 GM/177ML SOLN Take 1 kit by mouth once for 1 dose.  . pantoprazole (PROTONIX) 40 MG tablet Take 1 tablet (40 mg total) by mouth daily.  . potassium chloride (K-DUR) 10 MEQ tablet Take 1  tablet (10 mEq total) by mouth daily.  . pravastatin (PRAVACHOL) 40 MG tablet Take 1 tablet (40 mg total) by mouth daily.  . predniSONE (DELTASONE) 20 MG tablet 3 tabs poqday 1-2, 2 tabs poqday 3-4, 1 tab poqday 5-6  . traMADol (ULTRAM) 50 MG tablet Take 1 tablet (50 mg total) by mouth every 6 (six) hours as needed (G89.4 Chronic back pain).  . TRUEPLUS LANCETS 30G MISC Use as directed to monitor FSBS 3x daily. Dx: R67.89  . TRULICITY 1.5 FY/1.0FB SOPN INJECT  1.5MG  INTO  THE  SKIN EVERY WEEK   No facility-administered encounter medications on file as of 02/13/2018.    Allergies  Allergen Reactions  . Codeine Anaphylaxis and Hives  . Penicillins Anaphylaxis and Hives  . Food Nausea And Vomiting    Olives   . Januvia [Sitagliptin]     Stomach cramps  . Latex Hives       Patient Active Problem List   Diagnosis Date Noted  . Paroxysmal tachycardia ([place]) 01/07/2018  . Morbid obesity ([place]) 01/13/2017  . Pure hypercholesterolemia 01/11/2017  . Localized edema 01/11/2017  . Diabetic retinopathy ([place]) 05/29/2016  . Abdominal pain 03/28/2014  . Type 2 diabetes mellitus with complications ([place]) 51/10/5850  . Weakness 07/26/2013  . Hyperlipidemia   . Essential hypertension   . Shortness of breath 03/27/2012  . Edema 03/27/2012  . Near syncope 03/27/2012  . Palpitations 03/27/2012   Social History        Socioeconomic History  . Marital status: Single    Spouse name: Not on file  . Number of children: 0  . Years of education: Not on file  . Highest education level: Not on file  Occupational History  . Occupation: retired  Scientific laboratory technician  . Financial resource strain: Not on file  . Food insecurity:    Worry: Not on file    Inability: Not on file  . Transportation needs:    Medical: Not on file    Non-medical: Not on file  Tobacco Use  . Smoking status: Never Smoker  . Smokeless tobacco: Never Used  Substance and Sexual Activity  . Alcohol use: No     Comment: social drinker.  . Drug use: No  . Sexual activity: Not on file  Lifestyle  . Physical activity:    Days per week: Not on file    Minutes per session: Not on file  . Stress: Not on file  Relationships  . Social connections:    Talks on phone: Not on file    Gets together: Not on file    Attends religious service: Not on file    Active member of club or organization: Not on file    Attends meetings of clubs or organizations: Not on file    Relationship status: Not on file  . Intimate partner violence:    Fear of  current or ex partner: Not on file    Emotionally abused: Not on file    Physically abused: Not on file    Forced sexual activity: Not on file  Other Topics Concern  . Not on file  Social History Narrative  . Not on file    Ms. Soliman's family history includes Heart disease in her brother and father; Hyperlipidemia in her father and mother; Hypertension in her father; Kidney disease in her sister; Ovarian cancer in her maternal aunt; Stroke in her brother.      Objective:     Vitals:   02/13/18 1106  BP: 102/64  Pulse: 79    Physical Exam; well-developed older white female in no acute distress, pleasant blood pressure 102/64 pulse 79, height 5 foot 6, weight 314, BMI 50.6.  HEENT ;nontraumatic normocephalic EOMI PERRLA sclera anicteric, Cardiovascular; regular rate and rhythm with S1-S2 no murmur rub or gallop, Pulmonary ;clear bilaterally, Abdomen; morbidly obese, soft no palpable mass or hepatosplenomegaly bowel sounds are present, Rectal ;exam not done, Extremities; no clubbing cyanosis or edema skin warm dry, Neuro psych; alert and oriented, grossly nonfocal mood and affect appropriate       Assessment & Plan:   #59 62 year old white female, morbidly obese with BMI of 50.6 with recent abnormal barium swallow showing a probable thickened fold in the distal esophagus, no definite stricture and she was also noted to  have nonspecific esophageal dysmotility.  Rule out occult esophageal lesion, esophagitis, Barrett's  #2  Intermittent choking sensation which she describes as like someone "having their hands around your neck."  Etiology is not clear.  I do not think this is reflux related, and very unlikely to be related to nonspecific motility disorder.   #3 colon cancer surveillance-patient has not had any prior screening  #4 adult onset diabetes mellitus  #5 Hypertension  Plan:  Continue Protonix 40 mg p.o. every morning for now We will refer patient to ENT for laryngoscopy and evaluation Patient will be scheduled for upper endoscopy and colonoscopy with Dr. Fuller Plan.  Both procedures were discussed in detail with patient including indications risks and benefits and she is agreeable to proceed. Procedures will be scheduled at [facility] due to BMI greater than 50. Now the patient has decided against colonoscopy due to perforation risk. She was encouraged to complete Cologuard or FIT. She is considering her options.

## 2018-04-28 NOTE — Anesthesia Postprocedure Evaluation (Signed)
Anesthesia Post Note  Patient: Linda Briggs  Procedure(s) Performed: ESOPHAGOGASTRODUODENOSCOPY (EGD) WITH PROPOFOL (N/A ) BIOPSY     Patient location during evaluation: PACU Anesthesia Type: MAC Level of consciousness: awake and alert Pain management: pain level controlled Vital Signs Assessment: post-procedure vital signs reviewed and stable Respiratory status: spontaneous breathing, nonlabored ventilation, respiratory function stable and patient connected to nasal cannula oxygen Cardiovascular status: stable and blood pressure returned to baseline Postop Assessment: no apparent nausea or vomiting Anesthetic complications: no    Last Vitals:  Vitals:   04/28/18 0920 04/28/18 0930  BP: 140/74 123/77  Pulse: 86 85  Resp: 20 (!) 25  Temp:    SpO2: 97% 98%    Last Pain:  Vitals:   04/28/18 0930  TempSrc:   PainSc: 0-No pain                 Dauntae Derusha S

## 2018-04-29 ENCOUNTER — Encounter (HOSPITAL_COMMUNITY): Payer: Self-pay | Admitting: Gastroenterology

## 2018-05-05 ENCOUNTER — Encounter: Payer: Self-pay | Admitting: Gastroenterology

## 2018-05-06 ENCOUNTER — Ambulatory Visit (INDEPENDENT_AMBULATORY_CARE_PROVIDER_SITE_OTHER): Payer: Medicare HMO | Admitting: Physician Assistant

## 2018-05-06 ENCOUNTER — Telehealth: Payer: Self-pay

## 2018-05-06 ENCOUNTER — Encounter: Payer: Self-pay | Admitting: Physician Assistant

## 2018-05-06 ENCOUNTER — Other Ambulatory Visit: Payer: Self-pay

## 2018-05-06 VITALS — BP 132/78 | HR 79 | Temp 98.0°F | Resp 16 | Ht 66.0 in | Wt 309.2 lb

## 2018-05-06 DIAGNOSIS — N390 Urinary tract infection, site not specified: Secondary | ICD-10-CM | POA: Diagnosis not present

## 2018-05-06 DIAGNOSIS — R319 Hematuria, unspecified: Secondary | ICD-10-CM

## 2018-05-06 DIAGNOSIS — R1310 Dysphagia, unspecified: Secondary | ICD-10-CM

## 2018-05-06 DIAGNOSIS — K3189 Other diseases of stomach and duodenum: Secondary | ICD-10-CM

## 2018-05-06 DIAGNOSIS — R300 Dysuria: Secondary | ICD-10-CM | POA: Diagnosis not present

## 2018-05-06 LAB — URINALYSIS, ROUTINE W REFLEX MICROSCOPIC
Bilirubin Urine: NEGATIVE
Glucose, UA: NEGATIVE
Ketones, ur: NEGATIVE
Nitrite: NEGATIVE
PH: 6 (ref 5.0–8.0)
Specific Gravity, Urine: 1.018 (ref 1.001–1.03)

## 2018-05-06 LAB — MICROSCOPIC MESSAGE

## 2018-05-06 MED ORDER — PANTOPRAZOLE SODIUM 40 MG PO TBEC: 40.0000 mg | DELAYED_RELEASE_TABLET | Freq: Every day | ORAL | 6 refills | Status: DC

## 2018-05-06 MED ORDER — CIPROFLOXACIN HCL 500 MG PO TABS: 500.0000 mg | ORAL_TABLET | Freq: Two times a day (BID) | ORAL | 0 refills | Status: AC

## 2018-05-06 NOTE — Telephone Encounter (Signed)
-----   Message from Meryl Dare, MD sent at 05/05/2018  3:43 PM EDT ----- Please review recent EGD report and schedule GES. Thx.

## 2018-05-06 NOTE — Telephone Encounter (Signed)
GES scan scheduled for [facility] 05/18/18 9:15 arrival for a 9:30 appt She is notified to be NPO, hold pantorazole, tramadol, and zofran after midnight. She verbalized understanding.

## 2018-05-06 NOTE — Progress Notes (Signed)
Patient ID: Linda Briggs MRN: [id number], DOB: 24-Sep-1955, 62 y.o. Date of Encounter: 05/06/2018, 8:24 AM    Chief Complaint:  Chief Complaint  Patient presents with  . Dysuria    symptoms for 1 week      HPI: 62 y.o. year old female presents with above.   She reports that she went for endoscopy last Tuesday. She reports that she remembers right before doing that procedure she needed to urinate and that she had what seemed to be a "full, complete urination at that time.  " However, she reports that she felt like she has not had a full, complete urination since then. States that since then-- when she urinates, it seems to be a smaller amount-- but then she is going frequently every 1-2 hours. Reports that over the past week she has also been having urgency and urge incontinence.  Says that she will realize that she needs to urinate-- and will be on her way to the bathroom-- but will leak some urine.  Therefore, has been wearing a pad.  States that she has never had this problem in the past prior to this week. States that since last Wednesday she has also been having pain with urination. She reports that she has seen no blood in her urine.   Reports that she has had no fever.   Has felt no back pain.   Has had no other signs or symptoms.  Has no other concerns to address.     Home Meds:   Outpatient Medications Prior to Visit  Medication Sig Dispense Refill  . Alcohol Swabs (B-D SINGLE USE SWABS REGULAR) PADS Use to monitor FSBS 3x daily for fluctuating FSBS. DX: 250.00 (Patient not taking: Reported on 04/21/2018) 300 each 1  . aspirin EC 81 MG tablet Take 81 mg by mouth daily.    . Blood Glucose Calibration (TRUE METRIX LEVEL 2) Normal SOLN Use as directed to monitor FSBS 3x daily. Dx: E11.65 (Patient not taking: Reported on 04/21/2018) 1 each 1  . Blood Glucose Monitoring Suppl (TRUE METRIX AIR GLUCOSE METER) DEVI 1 each by Does not apply route daily. Use as directed to monitor  FSBS 3x daily. Dx: E11.65 (Patient not taking: Reported on 04/21/2018) 1 Device 1  . furosemide (LASIX) 20 MG tablet TAKE 1 TABLET EVERY DAY (Patient taking differently: Take 20 mg by mouth daily. ) 90 tablet 3  . gabapentin (NEURONTIN) 300 MG capsule TAKE 1 CAPSULE THREE TIMES DAILY (Patient taking differently: Take 300 mg by mouth 3 (three) times daily. ) 270 capsule 1  . glipiZIDE (GLUCOTROL XL) 10 MG 24 hr tablet Take 10 mg by mouth daily with breakfast.    . glucose blood (TRUE METRIX BLOOD GLUCOSE TEST) test strip Use as directed to monitor FSBS 3x daily. Dx: E11.65 (Patient not taking: Reported on 04/21/2018) 300 each 3  . lisinopril (PRINIVIL,ZESTRIL) 5 MG tablet Take 1 tablet (5 mg total) by mouth daily. 90 tablet 3  . metFORMIN (GLUCOPHAGE) 1000 MG tablet TAKE 1 TABLET TWICE DAILY WITH A MEAL (Patient taking differently: Take 1,000 mg by mouth 2 (two) times daily with a meal. ) 180 tablet 3  . metoprolol tartrate (LOPRESSOR) 50 MG tablet Take 1 tablet (50 mg total) by mouth 2 (two) times daily. 180 tablet 3  . ondansetron (ZOFRAN) 4 MG tablet Take 4 mg by mouth every 8 (eight) hours as needed for nausea or vomiting.    . pantoprazole (PROTONIX) 40 MG tablet  TAKE 1 TABLET BY MOUTH ONCE DAILY (Patient taking differently: Take 40 mg by mouth daily. ) 30 tablet 1  . potassium chloride (K-DUR) 10 MEQ tablet Take 1 tablet (10 mEq total) by mouth daily. 90 tablet 3  . pravastatin (PRAVACHOL) 40 MG tablet Take 1 tablet (40 mg total) by mouth daily. 90 tablet 3  . traMADol (ULTRAM) 50 MG tablet Take 1 tablet (50 mg total) by mouth every 6 (six) hours as needed (G89.4 Chronic back pain). (Patient taking differently: Take 50 mg by mouth every 6 (six) hours as needed for moderate pain. ) 60 tablet 2  . TRUEPLUS LANCETS 30G MISC Use as directed to monitor FSBS 3x daily. Dx: E11.65 (Patient not taking: Reported on 04/21/2018) 200 each 3  . TRULICITY 1.5 MG/0.5ML SOPN INJECT  1.5MG   INTO  THE  SKIN EVERY WEEK  (Patient taking differently: Inject 1.5 mg into the skin every Tuesday. ) 12 mL 1   No facility-administered medications prior to visit.     Allergies:  Allergies  Allergen Reactions  . Codeine Anaphylaxis and Hives  . Penicillins Anaphylaxis, Hives and Other (See Comments)    Has patient had a PCN reaction causing immediate rash, facial/tongue/throat swelling, SOB or lightheadedness with hypotension: No Has patient had a PCN reaction causing severe rash involving mucus membranes or skin necrosis: No Has patient had a PCN reaction that required hospitalization: No Has patient had a PCN reaction occurring within the last 10 years: No If all of the above answers are "NO", then may proceed with Cephalosporin use.   . Food Nausea And Vomiting and Other (See Comments)    Olives   . Januvia [Sitagliptin] Other (See Comments)    Stomach cramps  . Latex Hives      Review of Systems: See HPI for pertinent ROS. All other ROS negative.    Physical Exam: Blood pressure 132/78, pulse 79, temperature 98 F (36.7 C), temperature source Oral, resp. rate 16, height 5\' 6"  (1.676 m), weight (!) 140.3 kg, SpO2 98 %., Body mass index is 49.91 kg/m. General:  Obese Female. Appears in no acute distress.  Neck: Supple. No thyromegaly. No lymphadenopathy. Lungs: Clear bilaterally to auscultation without wheezes, rales, or rhonchi. Breathing is unlabored. Heart: Regular rhythm. No murmurs, rubs, or gallops. Abdomen: Soft, non-distended with normoactive bowel sounds. No hepatomegaly. No rebound/guarding. No obvious abdominal masses.  Mild tenderness with palpation in suprapubic region.  No other area of tenderness with palpation. Msk:  Strength and tone normal for age.  No tenderness with percussion to costophrenic angles bilaterally. Extremities/Skin: Warm and dry.  Neuro: Alert and oriented X 3. Moves all extremities spontaneously. Gait is normal. CNII-XII grossly in tact. Psych:  Responds to questions  appropriately with a normal affect.      ASSESSMENT AND PLAN:  62 y.o. year old female with   1. Urinary tract infection with hematuria, site unspecified UA and symptoms are consistent with UTI.  Will send culture for further information and confirmation.  She is to start the Cipro and take as directed and complete all 7 days.  Follow-up if symptoms do not resolve upon completion of Cipro. - Urine Culture - ciprofloxacin (CIPRO) 500 MG tablet; Take 1 tablet (500 mg total) by mouth 2 (two) times daily for 7 days.  Dispense: 14 tablet; Refill: 0  2. Dysuria - Urinalysis, Routine w reflex microscopic   Signed, [address] [place], [place], [facility] 05/06/2018 8:24 AM

## 2018-05-06 NOTE — Addendum Note (Signed)
Addended by: Annett Fabian on: 05/06/2018 10:10 AM   Modules accepted: Orders

## 2018-05-08 LAB — URINE CULTURE
MICRO NUMBER: 91056023
SPECIMEN QUALITY: ADEQUATE

## 2018-05-13 ENCOUNTER — Telehealth: Payer: Self-pay | Admitting: Family Medicine

## 2018-05-13 NOTE — Telephone Encounter (Addendum)
Pt called and states that she thinks she is having an allergic reaction to the Cipro. She is on her last dose and her tongue on right side is swelling. Pt denies any trouble breathing, throat swelling or whole tongue swelling just swelling on right side of tongue. She is talking normal on phone. She said she called the pharmacy and they told her to take Benadryl and call your doctor. Pt has not done that as she has no Benadryl at home. Informed pt to get some benadryl 50mg  and take q 4 hours until swelling has subsided and if symptoms worsen or she starts to have trouble breathing to go to ER or call 911. Pt verbalizes understanding.

## 2018-05-14 ENCOUNTER — Other Ambulatory Visit: Payer: Medicare HMO

## 2018-05-14 DIAGNOSIS — I10 Essential (primary) hypertension: Secondary | ICD-10-CM | POA: Diagnosis not present

## 2018-05-14 DIAGNOSIS — E7800 Pure hypercholesterolemia, unspecified: Secondary | ICD-10-CM

## 2018-05-14 DIAGNOSIS — Z794 Long term (current) use of insulin: Secondary | ICD-10-CM | POA: Diagnosis not present

## 2018-05-14 DIAGNOSIS — E118 Type 2 diabetes mellitus with unspecified complications: Secondary | ICD-10-CM

## 2018-05-15 LAB — HEMOGLOBIN A1C
Hgb A1c MFr Bld: 6.2 % of total Hgb — ABNORMAL HIGH (ref <5.7)
MEAN PLASMA GLUCOSE: 131 (calc)
eAG (mmol/L): 7.3 (calc)

## 2018-05-15 LAB — CBC WITH DIFFERENTIAL/PLATELET
BASOS ABS: 100 {cells}/uL (ref 0–200)
BASOS PCT: 1 %
Eosinophils Absolute: 240 cells/uL (ref 15–500)
Eosinophils Relative: 2.4 %
HCT: 36.3 % (ref 35.0–45.0)
HEMOGLOBIN: 12.6 g/dL (ref 11.7–15.5)
LYMPHS ABS: 2760 {cells}/uL (ref 850–3900)
MCH: 31.4 pg (ref 27.0–33.0)
MCHC: 34.7 g/dL (ref 32.0–36.0)
MCV: 90.5 fL (ref 80.0–100.0)
MONOS PCT: 5.6 %
MPV: 10.3 fL (ref 7.5–12.5)
NEUTROS PCT: 63.4 %
Neutro Abs: 6340 cells/uL (ref 1500–7800)
Platelets: 172 10*3/uL (ref 140–400)
RBC: 4.01 10*6/uL (ref 3.80–5.10)
RDW: 12.6 % (ref 11.0–15.0)
TOTAL LYMPHOCYTE: 27.6 %
WBC mixed population: 560 cells/uL (ref 200–950)
WBC: 10 10*3/uL (ref 3.8–10.8)

## 2018-05-15 LAB — COMPREHENSIVE METABOLIC PANEL
AG RATIO: 1.4 (calc) (ref 1.0–2.5)
ALBUMIN MSPROF: 3.5 g/dL — AB (ref 3.6–5.1)
ALKALINE PHOSPHATASE (APISO): 54 U/L (ref 33–130)
ALT: 16 U/L (ref 6–29)
AST: 20 U/L (ref 10–35)
BILIRUBIN TOTAL: 0.6 mg/dL (ref 0.2–1.2)
BUN/Creatinine Ratio: 14 (calc) (ref 6–22)
BUN: 16 mg/dL (ref 7–25)
CALCIUM: 8.3 mg/dL — AB (ref 8.6–10.4)
CHLORIDE: 99 mmol/L (ref 98–110)
CO2: 26 mmol/L (ref 20–32)
Creat: 1.18 mg/dL — ABNORMAL HIGH (ref 0.50–0.99)
GLOBULIN: 2.5 g/dL (ref 1.9–3.7)
Glucose, Bld: 94 mg/dL (ref 65–99)
POTASSIUM: 4 mmol/L (ref 3.5–5.3)
SODIUM: 138 mmol/L (ref 135–146)
TOTAL PROTEIN: 6 g/dL — AB (ref 6.1–8.1)

## 2018-05-15 LAB — LIPID PANEL
CHOL/HDL RATIO: 4.8 (calc) (ref <5.0)
CHOLESTEROL: 116 mg/dL (ref <200)
HDL: 24 mg/dL — AB (ref >50)
LDL CHOLESTEROL (CALC): 71 mg/dL
Non-HDL Cholesterol (Calc): 92 mg/dL (calc) (ref <130)
TRIGLYCERIDES: 126 mg/dL (ref <150)

## 2018-05-18 ENCOUNTER — Encounter (HOSPITAL_COMMUNITY)
Admission: RE | Admit: 2018-05-18 | Discharge: 2018-05-18 | Disposition: A | Discharge status: Home / Self Care | Payer: Medicare HMO | Source: Ambulatory Visit | Attending: Gastroenterology | Admitting: Gastroenterology

## 2018-05-18 DIAGNOSIS — K3189 Other diseases of stomach and duodenum: Secondary | ICD-10-CM | POA: Insufficient documentation

## 2018-05-18 DIAGNOSIS — R1310 Dysphagia, unspecified: Secondary | ICD-10-CM | POA: Diagnosis not present

## 2018-05-18 DIAGNOSIS — R110 Nausea: Secondary | ICD-10-CM | POA: Diagnosis not present

## 2018-05-18 DIAGNOSIS — E119 Type 2 diabetes mellitus without complications: Secondary | ICD-10-CM | POA: Diagnosis not present

## 2018-05-18 MED ORDER — TECHNETIUM TC 99M SULFUR COLLOID: 1.9000 | Freq: Once | INTRAVENOUS | Status: DC | PRN

## 2018-05-19 ENCOUNTER — Other Ambulatory Visit: Payer: Self-pay

## 2018-05-19 MED ORDER — PANTOPRAZOLE SODIUM 40 MG PO TBEC: 40.0000 mg | DELAYED_RELEASE_TABLET | Freq: Two times a day (BID) | ORAL | 6 refills | Status: DC

## 2018-05-22 ENCOUNTER — Encounter: Payer: Self-pay | Admitting: Family Medicine

## 2018-05-22 ENCOUNTER — Ambulatory Visit (INDEPENDENT_AMBULATORY_CARE_PROVIDER_SITE_OTHER): Payer: Medicare HMO | Admitting: Family Medicine

## 2018-05-22 VITALS — BP 140/76 | HR 70 | Temp 98.2°F | Resp 18 | Ht 66.0 in | Wt 310.0 lb

## 2018-05-22 DIAGNOSIS — R319 Hematuria, unspecified: Secondary | ICD-10-CM | POA: Diagnosis not present

## 2018-05-22 DIAGNOSIS — I10 Essential (primary) hypertension: Secondary | ICD-10-CM

## 2018-05-22 DIAGNOSIS — I890 Lymphedema, not elsewhere classified: Secondary | ICD-10-CM | POA: Diagnosis not present

## 2018-05-22 DIAGNOSIS — Z23 Encounter for immunization: Secondary | ICD-10-CM | POA: Diagnosis not present

## 2018-05-22 DIAGNOSIS — E7800 Pure hypercholesterolemia, unspecified: Secondary | ICD-10-CM | POA: Diagnosis not present

## 2018-05-22 DIAGNOSIS — E118 Type 2 diabetes mellitus with unspecified complications: Secondary | ICD-10-CM | POA: Diagnosis not present

## 2018-05-22 DIAGNOSIS — Z794 Long term (current) use of insulin: Secondary | ICD-10-CM

## 2018-05-22 LAB — URINALYSIS, ROUTINE W REFLEX MICROSCOPIC
Bilirubin Urine: NEGATIVE
GLUCOSE, UA: NEGATIVE
Ketones, ur: NEGATIVE
Nitrite: NEGATIVE
PH: 6 (ref 5.0–8.0)
Specific Gravity, Urine: 1.015 (ref 1.001–1.03)

## 2018-05-22 LAB — MICROSCOPIC MESSAGE

## 2018-05-22 MED ORDER — NITROFURANTOIN MONOHYD MACRO 100 MG PO CAPS: 100.0000 mg | ORAL_CAPSULE | Freq: Two times a day (BID) | ORAL | 0 refills | Status: DC

## 2018-05-22 NOTE — Addendum Note (Signed)
Addended by: Legrand RamsWILLIS, Kristina Mcnorton B on: 05/22/2018 12:39 PM   Modules accepted: Orders

## 2018-05-22 NOTE — Progress Notes (Signed)
Subjective:    Patient ID: Linda Briggs, female    DOB: Nov 09, 1955, 62 y.o.   MRN: [id number]  Medication Refill   Back Pain     11/05/16  Patient is not taking insulin the way written on her list. She is on 70/30, 20 units only in the morning. She is not taking any insulin at night. She denies any hypoglycemia. She states that her sugars are between 100-150. Diabetic eye exam is up-to-date. Diabetic foot exam is performed today and is mitigated for thick hyperkeratotic skin on both legs distal to the knee, chronic venous stasis changes, hard and firm indurated skin, and no sensation to 10 g monofilament in either feet. She does have palpable pulses at the dorsalis pedis and posterior tibialis bilaterally. She is not exercising. She stays confined to wheelchair the majority of the day due to chronic low back pain and right-sided sciatica. She is getting no exercise whatsoever. She denies any chest pain shortness of breath or dyspnea on exertion although she is extremely sedentary. She is due for fasting lab work.  At that time, my plan was: Patient has a history of a multinodular goiter. Her last ultrasound of her thyroid was done more than 2 years ago. She is due to recheck this. Initial biopsies were negative. Her blood pressure today is borderline but her blood pressure at home is well controlled between 110 and 120/80. I will check a fasting lipid panel today. Her goal LDL cholesterol is less than 100. I will also check a hemoglobin A1c as well as a urine microalbumin. I had a 30 minute discussion with the patient about increasing her physical activity. If she does not make an attempt, her 5 year life expectancy is extremely low as is her morbidity. I recommended going to a pool and walking in the pool 5 days a week and gradually increasing her exercise until she is able to walk for 30 minutes a day. I would then like her to try to start walking out of the pool. This discontinue Actos to decrease  the swelling in her legs and replaced with Victoza to try to improve her ability to lose weight 1.2 mg sq daily.    02/10/17 Victoza caused the patient to have significant nausea and vomiting and abdominal discomfort. She discontinued the medication.  She has lost 14 pounds since discontinuing Actos. Her fasting blood sugars are excellent and typically less than 130. She denies any hypoglycemic episodes. Unfortunate her blood pressure remains very low. The majority of her systolic blood pressures are less than 100. She is currently on lisinopril 40 mg a day. She denies any chest pain shortness of breath or dyspnea on exertion. She is not exercising.  At that time, my plan was: Blood pressure is too low. Decrease lisinopril to 5 mg a day and recheck blood pressure in one month.  I will continue metformin but discontinued victoza altogether. Check fasting lipid panel. Goal LDL cholesterol is less than 100. Continue to encourage diet exercise and weight loss.  08/18/17 Here for follow up. At her last visit, HgA1c was 6.0!  However 3 months ago, the patient resumed 70/30 insulin, 40 units in the morning and 30 units in the evening due to hyperglycemia.  Patient states her fasting blood sugars are now around 100.  Her 2-hour postprandial sugars are near 150.  She denies any hypoglycemic episodes.  She denies any polyuria, polydipsia, or blurry vision.  She denies any chest pain, shortness of  breath, or dyspnea on exertion.  She has had her flu shot.  She has had Pneumovax 23.  She is due for Prevnar 13.  Unfortunately her mother is dying from sepsis.  Patient is very distraught regarding this.  This explains the mild elevation in her blood pressure.  She is going straight to the hospital as soon as she leaves here.  She is crying today on exam.  At that time, my plan was: Blood pressure is elevated due to acute grief reaction.  Her typical blood pressure is well controlled at 120/80.  Her blood sugars sound  excellent.  However I am concerned about hypoglycemia on such high-dose insulin particularly fast acting insulin.  I recommended discontinuation of 70/30 insulin and replacing it with Trulicity 1.5 mg weekly.  Patient will try samples of the medication over the first 2 weeks and call us back and let us know how she is doing.  I will be glad to call out a prescription for this if she sees benefit.  Monitor for nausea and vomiting.  Check fasting lipid panel.  Goal LDL cholesterol is less than 100.  Patient received Prevnar 13 today in the office.  I gave the patient my deepest sympathy is regarding her mother's illness  11/17/17  Patient is asymptomatic.  Since her last visit, she has lost 8 pounds.  Her blood pressure is excellent at 118/70.  Her hemoglobin A1c is well controlled at 6.2.  Now that she is compliant with pravastatin which she was not previously taking regularly, her LDL cholesterol has fallen well below 100.  She has a follow-up appointment for her thyroid ultrasound later this month to monitor her multinodular goiter.  If this is stable, follow-up in 6 months.  Continue to encourage exercise and weight loss   05/22/18 Most recent labs are listed below: No visits with results within 1 Week(s) from this visit.  Latest known visit with results is:  Lab on 05/14/2018  Component Date Value Ref Range Status  . Cholesterol 05/14/2018 116  <200 mg/dL Final  . HDL 16/10/960409/08/2018 24* >50 mg/dL Final  . Triglycerides 05/14/2018 126  <150 mg/dL Final  . LDL Cholesterol (Calc) 05/14/2018 71  mg/dL (calc) Final   Comment: Reference range: <100 . Desirable range <100 mg/dL for primary prevention;   <70 mg/dL for patients with CHD or diabetic patients  with > or = 2 CHD risk factors. Marland Kitchen. LDL-C is now calculated using the Martin-Hopkins  calculation, which is a validated novel method providing  better accuracy than the Friedewald equation in the  estimation of LDL-C.  Horald PollenMartin SS et al. Lenox AhrJAMA.  5409;811(912013;310(19): 2061-2068  (http://education.[facility].com/faq/FAQ164)   . Total CHOL/HDL Ratio 05/14/2018 4.8  <4.7<5.0 (calc) Final  . Non-HDL Cholesterol (Calc) 05/14/2018 92  <130 mg/dL (calc) Final   Comment: For patients with diabetes plus 1 major ASCVD risk  factor, treating to a non-HDL-C goal of <100 mg/dL  (LDL-C of <82<70 mg/dL) is considered a therapeutic  option.   . Hgb A1c MFr Bld 05/14/2018 6.2* <5.7 % of total Hgb Final   Comment: For someone without known diabetes, a hemoglobin  A1c value between 5.7% and 6.4% is consistent with prediabetes and should be confirmed with a  follow-up test. . For someone with known diabetes, a value <7% indicates that their diabetes is well controlled. A1c targets should be individualized based on duration of diabetes, age, comorbid conditions, and other considerations. . This assay result is consistent with an increased risk  of diabetes. . Currently, no consensus exists regarding use of hemoglobin A1c for diagnosis of diabetes for children. .   . Mean Plasma Glucose 05/14/2018 131  (calc) Final  . eAG (mmol/L) 05/14/2018 7.3  (calc) Final  . Glucose, Bld 05/14/2018 94  65 - 99 mg/dL Final   Comment: .            Fasting reference interval .   . BUN 05/14/2018 16  7 - 25 mg/dL Final  . Creat 40/98/1191 1.18* 0.50 - 0.99 mg/dL Final   Comment: For patients >55 years of age, the reference limit for Creatinine is approximately 13% higher for people identified as African-American. .   Edwena Felty Ratio 05/14/2018 14  6 - 22 (calc) Final  . Sodium 05/14/2018 138  135 - 146 mmol/L Final  . Potassium 05/14/2018 4.0  3.5 - 5.3 mmol/L Final  . Chloride 05/14/2018 99  98 - 110 mmol/L Final  . CO2 05/14/2018 26  20 - 32 mmol/L Final  . Calcium 05/14/2018 8.3* 8.6 - 10.4 mg/dL Final  . Total Protein 05/14/2018 6.0* 6.1 - 8.1 g/dL Final  . Albumin 47/82/9562 3.5* 3.6 - 5.1 g/dL Final  . Globulin 13/04/6577 2.5  1.9 - 3.7 g/dL  (calc) Final  . AG Ratio 05/14/2018 1.4  1.0 - 2.5 (calc) Final  . Total Bilirubin 05/14/2018 0.6  0.2 - 1.2 mg/dL Final  . Alkaline phosphatase (APISO) 05/14/2018 54  33 - 130 U/L Final  . AST 05/14/2018 20  10 - 35 U/L Final  . ALT 05/14/2018 16  6 - 29 U/L Final  . WBC 05/14/2018 10.0  3.8 - 10.8 Thousand/uL Final  . RBC 05/14/2018 4.01  3.80 - 5.10 Million/uL Final  . Hemoglobin 05/14/2018 12.6  11.7 - 15.5 g/dL Final  . HCT 46/96/2952 36.3  35.0 - 45.0 % Final  . MCV 05/14/2018 90.5  80.0 - 100.0 fL Final  . MCH 05/14/2018 31.4  27.0 - 33.0 pg Final  . MCHC 05/14/2018 34.7  32.0 - 36.0 g/dL Final  . RDW 84/13/2440 12.6  11.0 - 15.0 % Final  . Platelets 05/14/2018 172  140 - 400 Thousand/uL Final  . MPV 05/14/2018 10.3  7.5 - 12.5 fL Final  . Neutro Abs 05/14/2018 6,340  1,500 - 7,800 cells/uL Final  . Lymphs Abs 05/14/2018 2,760  850 - 3,900 cells/uL Final  . WBC mixed population 05/14/2018 560  200 - 950 cells/uL Final  . Eosinophils Absolute 05/14/2018 240  15 - 500 cells/uL Final  . Basophils Absolute 05/14/2018 100  0 - 200 cells/uL Final  . Neutrophils Relative % 05/14/2018 63.4  % Final  . Total Lymphocyte 05/14/2018 27.6  % Final  . Monocytes Relative 05/14/2018 5.6  % Final  . Eosinophils Relative 05/14/2018 2.4  % Final  . Basophils Relative 05/14/2018 1.0  % Final   Patient presents today with several concerns.  Recently was diagnosed with urinary tract infection.  Urine culture grew Enterobacter.  It was sensitive to the Cipro that my partner gave her for 7 days.  Symptoms improved.  However a few days after discontinuing antibiotics, the pain has returned.  She denies burning when she pees however she reports pain in her bladder after she pees.  She states it feels like someone is ripping her bladder out.  She also reports hematuria, increased urinary frequency and urgency and hesitancy.  Urinalysis shows +1 blood and +1 leukocyte esterase.  She denies any fever or  CVA  tenderness.  Second issue is she had a gastric emptying study performed this summer.  She emptied only 37% after 4 hours, 90% being normal.  She continues to report early satiety and nausea consistent with gastroparesis however she is also on Trulicity.  Her lab work is outstanding.  Hemoglobin A1c remains well controlled at 6.2 suggesting that perhaps we can discontinue Trulicity.  She denies any polyuria, polydipsia, or blurry vision.  She denies any chest pain shortness of breath or dyspnea on exertion.  She denies any myalgias or right upper quadrant pain.  Past Medical History:  Diagnosis Date  . Arthritis    feet & toes  . Bulging disc   . Diabetes mellitus   . History of hepatitis B   . Hyperlipidemia   . Hypertension   . Neuromuscular disorder (HCC)    in feet and fingers   . Neuropathy    feet  . Nonproliferative diabetic retinopathy (HCC)   . 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 Peripheral vascular disease (HCC)   . Shortness of breath dyspnea    on lopressor   . Sleep apnea    pt had study done but it was incomplete due to blood sugar issues    Past Surgical History:  Procedure Laterality Date  . BIOPSY  04/28/2018   Procedure: BIOPSY;  Surgeon: Meryl Dare, MD;  Location: [facility];  Service: Endoscopy;;  . ESOPHAGOGASTRODUODENOSCOPY (EGD) WITH PROPOFOL N/A 04/28/2018   Procedure: ESOPHAGOGASTRODUODENOSCOPY (EGD) WITH PROPOFOL;  Surgeon: Meryl Dare, MD;  Location: [facility];  Service: Endoscopy;  Laterality: N/A;  . HYSTEROSCOPY W/D&C N/A 02/15/2015   Procedure: DILATATION AND CURETTAGE /HYSTEROSCOPY;  Surgeon: Mitchel Honour, DO;  Location: [facility];  Service: Gynecology;  Laterality: N/A;  . TONSILLECTOMY    . tyroid biospy     january 2016   Current Outpatient Medications on File Prior to Visit  Medication Sig Dispense Refill  . Alcohol Swabs (B-D SINGLE USE SWABS REGULAR) PADS Use to monitor FSBS 3x daily for fluctuating FSBS. DX: 250.00 300 each  1  . aspirin EC 81 MG tablet Take 81 mg by mouth daily.    . Blood Glucose Calibration (TRUE METRIX LEVEL 2) Normal SOLN Use as directed to monitor FSBS 3x daily. Dx: E11.65 1 each 1  . Blood Glucose Monitoring Suppl (TRUE METRIX AIR GLUCOSE METER) DEVI 1 each by Does not apply route daily. Use as directed to monitor FSBS 3x daily. Dx: E11.65 1 Device 1  . furosemide (LASIX) 20 MG tablet TAKE 1 TABLET EVERY DAY (Patient taking differently: Take 20 mg by mouth daily. ) 90 tablet 3  . gabapentin (NEURONTIN) 300 MG capsule TAKE 1 CAPSULE THREE TIMES DAILY (Patient taking differently: Take 300 mg by mouth 3 (three) times daily. ) 270 capsule 1  . glipiZIDE (GLUCOTROL XL) 10 MG 24 hr tablet Take 10 mg by mouth daily with breakfast.    . glucose blood (TRUE METRIX BLOOD GLUCOSE TEST) test strip Use as directed to monitor FSBS 3x daily. Dx: E11.65 300 each 3  . lisinopril (PRINIVIL,ZESTRIL) 5 MG tablet Take 1 tablet (5 mg total) by mouth daily. 90 tablet 3  . metFORMIN (GLUCOPHAGE) 1000 MG tablet TAKE 1 TABLET TWICE DAILY WITH A MEAL (Patient taking differently: Take 1,000 mg by mouth 2 (two) times daily with a meal. ) 180 tablet 3  . ondansetron (ZOFRAN) 4 MG tablet Take 4 mg by mouth every 8 (eight)  hours as needed for nausea or vomiting.    . pantoprazole (PROTONIX) 40 MG tablet Take 1 tablet (40 mg total) by mouth 2 (two) times daily. 60 tablet 6  . potassium chloride (K-DUR) 10 MEQ tablet Take 1 tablet (10 mEq total) by mouth daily. 90 tablet 3  . pravastatin (PRAVACHOL) 40 MG tablet Take 1 tablet (40 mg total) by mouth daily. 90 tablet 3  . traMADol (ULTRAM) 50 MG tablet Take 1 tablet (50 mg total) by mouth every 6 (six) hours as needed (G89.4 Chronic back pain). (Patient taking differently: Take 50 mg by mouth every 6 (six) hours as needed for moderate pain. ) 60 tablet 2  . TRUEPLUS LANCETS 30G MISC Use as directed to monitor FSBS 3x daily. Dx: E11.65 200 each 3  . TRULICITY 1.5 MG/0.5ML SOPN INJECT   1.5MG   INTO  THE  SKIN EVERY WEEK (Patient taking differently: Inject 1.5 mg into the skin every Tuesday. ) 12 mL 1  . metoprolol tartrate (LOPRESSOR) 50 MG tablet Take 1 tablet (50 mg total) by mouth 2 (two) times daily. 180 tablet 3   Current Facility-Administered Medications on File Prior to Visit  Medication Dose Route Frequency Provider Last Rate Last Dose  . technetium sulfur colloid (NYCOMED-Harrison) injection solution 1.9 millicurie  1.9 millicurie Intravenous Once PRN Kennith Center, MD       Allergies  Allergen Reactions  . Codeine Anaphylaxis and Hives  . Penicillins Anaphylaxis, Hives and Other (See Comments)    Has patient had a PCN reaction causing immediate rash, facial/tongue/throat swelling, SOB or lightheadedness with hypotension: No Has patient had a PCN reaction causing severe rash involving mucus membranes or skin necrosis: No Has patient had a PCN reaction that required hospitalization: No Has patient had a PCN reaction occurring within the last 10 years: No If all of the above answers are "NO", then may proceed with Cephalosporin use.   . Food Nausea And Vomiting and Other (See Comments)    Olives   . Januvia [Sitagliptin] Other (See Comments)    Stomach cramps  . Latex Hives   Social History   Socioeconomic History  . Marital status: Single    Spouse name: Not on file  . Number of children: 0  . Years of education: Not on file  . Highest education level: Not on file  Occupational History  . Occupation: retired  Engineer, production  . Financial resource strain: Not on file  . Food insecurity:    Worry: Not on file    Inability: Not on file  . Transportation needs:    Medical: Not on file    Non-medical: Not on file  Tobacco Use  . Smoking status: Never Smoker  . Smokeless tobacco: Never Used  Substance and Sexual Activity  . Alcohol use: No    Comment: social drinker.  . Drug use: No  . Sexual activity: Not on file  Lifestyle  . Physical activity:    Days  per week: Not on file    Minutes per session: Not on file  . Stress: Not on file  Relationships  . Social connections:    Talks on phone: Not on file    Gets together: Not on file    Attends religious service: Not on file    Active member of club or organization: Not on file    Attends meetings of clubs or organizations: Not on file    Relationship status: Not on file  . Intimate partner violence:  Fear of current or ex partner: Not on file    Emotionally abused: Not on file    Physically abused: Not on file    Forced sexual activity: Not on file  Other Topics Concern  . Not on file  Social History Narrative  . Not on file   Family History  Problem Relation Age of Onset  . Hyperlipidemia Mother   . Heart disease Father   . Hyperlipidemia Father   . Hypertension Father   . Heart disease Brother   . Stroke Brother   . Kidney disease Sister   . Ovarian cancer Maternal Aunt   . Colon cancer Neg Hx   . Stomach cancer Neg Hx       Review of Systems  Musculoskeletal: Positive for back pain.  All other systems reviewed and are negative.      Objective:   Physical Exam  Constitutional: She is oriented to person, place, and time. She appears well-developed and well-nourished. No distress.  HENT:  Head: Normocephalic and atraumatic.  Right Ear: External ear normal.  Left Ear: External ear normal.  Nose: Nose normal.  Mouth/Throat: Oropharynx is clear and moist. No oropharyngeal exudate.  Eyes: Pupils are equal, round, and reactive to light. Conjunctivae and EOM are normal. Right eye exhibits no discharge. Left eye exhibits no discharge. No scleral icterus.  Neck: Normal range of motion. Neck supple. No JVD present. No tracheal deviation present. No thyromegaly present.  Cardiovascular: Normal rate, regular rhythm, normal heart sounds and intact distal pulses. Exam reveals no gallop and no friction rub.  No murmur heard. Pulmonary/Chest: Effort normal and breath sounds  normal. No stridor. No respiratory distress. She has no wheezes. She has no rales. She exhibits no tenderness.  Abdominal: Soft. Bowel sounds are normal. She exhibits no distension and no mass. There is no tenderness. There is no rebound and no guarding.  Musculoskeletal: Normal range of motion. She exhibits no edema or tenderness.  Lymphadenopathy:    She has no cervical adenopathy.  Neurological: She is alert and oriented to person, place, and time. She has normal reflexes. No cranial nerve deficit. She exhibits normal muscle tone. Coordination normal.  Skin: Skin is warm. No rash noted. She is not diaphoretic. No erythema. No pallor.  Psychiatric: She has a normal mood and affect. Her behavior is normal. Judgment and thought content normal.  Vitals reviewed.         Assessment & Plan:  Essential hypertension  Type 2 diabetes mellitus with complication, with long-term current use of insulin (HCC)  Pure hypercholesterolemia  Morbid obesity (HCC)  Lymphedema  Hematuria, unspecified type - Plan: Urinalysis, Routine w reflex microscopic  Given her symptoms and urinalysis, I will treat the patient with Macrobid 100 mg p.o. twice daily for 7 days.  I will send her urine for repeat culture.  Her blood pressure is well controlled.  Her cholesterol is excellent.  Her diabetes is well controlled.  However given her gastroparesis and persistent nausea I have recommended discontinuing Trulicity for 1 month.  She will recheck with me in 1 month and give me an update on how well her sugars are managed and also if her nausea has improved.  If her nausea has improved and her sugars are greater than 200, we may need to do this continue Trulicity indefinitely and replace it with something like Januvia.  Well controlled even off the Trulicity we may just discontinue Acacian and not replace it.  Recommended a flu shot.

## 2018-05-24 LAB — URINE CULTURE
MICRO NUMBER:: 91132221
SPECIMEN QUALITY: ADEQUATE

## 2018-06-02 ENCOUNTER — Other Ambulatory Visit: Payer: Self-pay | Admitting: Family Medicine

## 2018-06-02 MED ORDER — LISINOPRIL 5 MG PO TABS: 5.0000 mg | ORAL_TABLET | Freq: Every day | ORAL | 3 refills | Status: DC

## 2018-06-02 MED ORDER — PANTOPRAZOLE SODIUM 40 MG PO TBEC: 40.0000 mg | DELAYED_RELEASE_TABLET | Freq: Two times a day (BID) | ORAL | 3 refills | Status: DC

## 2018-06-09 ENCOUNTER — Other Ambulatory Visit: Payer: Self-pay | Admitting: *Deleted

## 2018-06-09 MED ORDER — PANTOPRAZOLE SODIUM 40 MG PO TBEC: 40.0000 mg | DELAYED_RELEASE_TABLET | Freq: Two times a day (BID) | ORAL | 3 refills | Status: DC

## 2018-06-09 MED ORDER — LISINOPRIL 5 MG PO TABS: 5.0000 mg | ORAL_TABLET | Freq: Every day | ORAL | 3 refills | Status: DC

## 2018-06-30 ENCOUNTER — Ambulatory Visit: Payer: Medicare HMO | Admitting: Gastroenterology

## 2018-06-30 ENCOUNTER — Encounter: Payer: Self-pay | Admitting: Gastroenterology

## 2018-06-30 VITALS — BP 130/66 | HR 76 | Ht 66.0 in | Wt 303.2 lb

## 2018-06-30 DIAGNOSIS — K3184 Gastroparesis: Secondary | ICD-10-CM | POA: Diagnosis not present

## 2018-06-30 DIAGNOSIS — K219 Gastro-esophageal reflux disease without esophagitis: Secondary | ICD-10-CM

## 2018-06-30 NOTE — Patient Instructions (Signed)
Thank you for choosing me and [facility].  Malcolm T. Stark, Jr., MD., FACG  

## 2018-06-30 NOTE — Progress Notes (Signed)
    History of Present Illness: This is a 62 year old female returning for follow-up of gastroparesis, nausea and GERD.  She states her symptoms improved substantially on a gastroparesis diet and since discontinuing Trulicity.  She is having a difficult time managing dietary restrictions for diabetes and gastroparesis which is understandable.  Current Medications, Allergies, Past Medical History, Past Surgical History, Family History and Social History were reviewed in [employer] record.  Physical Exam: General: Well developed, well nourished, no acute distress Head: Normocephalic and atraumatic Eyes:  sclerae anicteric, EOMI Ears: Normal auditory acuity Neurological: Alert oriented x 4, grossly nonfocal Psychological:  Alert and cooperative. Normal mood and affect   Assessment and Recommendations:  1.  Gastroparesis and GERD.  Follow standard antireflux measures and standard gastroparesis diet.  Continue pantoprazole 40 mg twice daily.  We discussed the possibility of promotility agent such as metoclopramide if symptoms are not adequately controlled.  We discussed the potential short-term and long-term side effects of metoclopramide.  She is comfortable remaining on standard dietary therapy and pantoprazole at this time.  I addressed her questions to her satisfaction.  REV in 1 year.

## 2018-07-17 ENCOUNTER — Other Ambulatory Visit: Payer: Self-pay | Admitting: Family Medicine

## 2018-07-17 MED ORDER — DULAGLUTIDE 0.75 MG/0.5ML ~~LOC~~ SOAJ: SUBCUTANEOUS | 1 refills | Status: DC

## 2018-08-31 ENCOUNTER — Other Ambulatory Visit: Payer: Self-pay | Admitting: Family Medicine

## 2018-09-23 ENCOUNTER — Other Ambulatory Visit: Payer: Self-pay | Admitting: Family Medicine

## 2018-10-20 ENCOUNTER — Other Ambulatory Visit: Payer: Self-pay | Admitting: Family Medicine

## 2018-10-20 NOTE — Telephone Encounter (Signed)
Ok to refill??  Last office visit 05/22/2018.  Last refill 02/06/2018, #2 refills.

## 2018-10-21 ENCOUNTER — Telehealth: Payer: Self-pay | Admitting: Family Medicine

## 2018-10-21 NOTE — Telephone Encounter (Signed)
PA Submitted through [email or web address] and received the following:  Approvedtoday PA Case: [id number], Status: Approved, Coverage Starts on: 10/21/2018 12:00:00 AM, Coverage Ends on: 09/02/2019 12:00:00 AM. Questions? Contact [phone].  Pharm made aware

## 2018-10-22 ENCOUNTER — Encounter: Payer: Self-pay | Admitting: Family Medicine

## 2018-10-22 ENCOUNTER — Ambulatory Visit (INDEPENDENT_AMBULATORY_CARE_PROVIDER_SITE_OTHER): Payer: Medicare HMO | Admitting: Family Medicine

## 2018-10-22 VITALS — BP 144/80 | HR 84 | Temp 97.9°F | Resp 20 | Ht 66.0 in | Wt 308.0 lb

## 2018-10-22 DIAGNOSIS — R310 Gross hematuria: Secondary | ICD-10-CM | POA: Diagnosis not present

## 2018-10-22 LAB — URINALYSIS, ROUTINE W REFLEX MICROSCOPIC
Bilirubin Urine: NEGATIVE
Glucose, UA: NEGATIVE
HGB URINE DIPSTICK: NEGATIVE
KETONES UR: NEGATIVE
Leukocytes,Ua: NEGATIVE
Nitrite: NEGATIVE
Protein, ur: NEGATIVE
Specific Gravity, Urine: 1.02 (ref 1.001–1.03)
pH: 6 (ref 5.0–8.0)

## 2018-10-22 MED ORDER — HYDROMORPHONE HCL 4 MG PO TABS: 4.0000 mg | ORAL_TABLET | Freq: Four times a day (QID) | ORAL | 0 refills | Status: DC | PRN

## 2018-10-22 MED ORDER — TAMSULOSIN HCL 0.4 MG PO CAPS: 0.4000 mg | ORAL_CAPSULE | Freq: Every day | ORAL | 0 refills | Status: DC

## 2018-10-22 NOTE — Progress Notes (Signed)
Subjective:    Patient ID: Linda Briggs, female    DOB: October 03, 1955, 63 y.o.   MRN: [id number]  Patient symptoms began approximately 2 weeks ago.  Patient developed severe sharp pain in her right flank near the right kidney.  The pain was intense.  It was not relieved or exacerbated by anything.  The pain lasted 45 minutes and spontaneously relieved.  The next day the pain returned.  Shortly thereafter she developed gross hematuria.  On 3 separate occasions she seen gross hematuria.  The pain comes and goes over the last 2 weeks.  It is always located in the right flank.  It is never associated with movement.  There are no exacerbating or alleviating factors.  It comes and goes like a toothache.  It is sharp and intense when it occurs.  She denies any dysuria.  Urinalysis today is completely clear.  She denies any nausea or vomiting however when the pain occurs it is so severe that the patient will break out in a cold sweat.  Past Medical History:  Diagnosis Date  . Arthritis    feet & toes  . Bulging disc   . Diabetes mellitus   . Gastroparesis   . History of hepatitis B   . Hyperlipidemia   . Hypertension   . Neuromuscular disorder (HCC)    in feet and fingers   . Neuropathy    feet  . Nonproliferative diabetic retinopathy (HCC)   . 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 Peripheral vascular disease (HCC)   . Shortness of breath dyspnea    on lopressor   . Sleep apnea    pt had study done but it was incomplete due to blood sugar issues    Past Surgical History:  Procedure Laterality Date  . BIOPSY  04/28/2018   Procedure: BIOPSY;  Surgeon: Meryl Dare, MD;  Location: [facility];  Service: Endoscopy;;  . BIOPSY THYROID     january 2016  . ESOPHAGOGASTRODUODENOSCOPY (EGD) WITH PROPOFOL N/A 04/28/2018   Procedure: ESOPHAGOGASTRODUODENOSCOPY (EGD) WITH PROPOFOL;  Surgeon: Meryl Dare, MD;  Location: [facility];  Service: Endoscopy;  Laterality: N/A;  .  HYSTEROSCOPY W/D&C N/A 02/15/2015   Procedure: DILATATION AND CURETTAGE /HYSTEROSCOPY;  Surgeon: Mitchel Honour, DO;  Location: [facility];  Service: Gynecology;  Laterality: N/A;  . TONSILLECTOMY     Current Outpatient Medications on File Prior to Visit  Medication Sig Dispense Refill  . Alcohol Swabs (B-D SINGLE USE SWABS REGULAR) PADS Use to monitor FSBS 3x daily for fluctuating FSBS. DX: 250.00 300 each 1  . aspirin EC 81 MG tablet Take 81 mg by mouth daily.    . Blood Glucose Calibration (TRUE METRIX LEVEL 2) Normal SOLN Use as directed to monitor FSBS 3x daily. Dx: E11.65 1 each 1  . Blood Glucose Monitoring Suppl (TRUE METRIX AIR GLUCOSE METER) DEVI 1 each by Does not apply route daily. Use as directed to monitor FSBS 3x daily. Dx: E11.65 1 Device 1  . Dulaglutide 0.75 MG/0.5ML SOPN INJECT  0.75MG   INTO  THE  SKIN  ONCE  A  WEEK 12 pen 2  . furosemide (LASIX) 20 MG tablet TAKE 1 TABLET EVERY DAY 90 tablet 3  . gabapentin (NEURONTIN) 300 MG capsule TAKE 1 CAPSULE THREE TIMES DAILY 270 capsule 1  . glucose blood (TRUE METRIX BLOOD GLUCOSE TEST) test strip Use as directed to monitor FSBS 3x daily. Dx: E11.65 300 each 3  .  lisinopril (PRINIVIL,ZESTRIL) 5 MG tablet Take 1 tablet (5 mg total) by mouth daily. 90 tablet 3  . metFORMIN (GLUCOPHAGE) 1000 MG tablet TAKE 1 TABLET TWICE DAILY WITH A MEAL (Patient taking differently: Take 1,000 mg by mouth 2 (two) times daily with a meal. ) 180 tablet 3  . metoprolol tartrate (LOPRESSOR) 50 MG tablet Take 1 tablet (50 mg total) by mouth 2 (two) times daily. 180 tablet 3  . ondansetron (ZOFRAN) 4 MG tablet Take 4 mg by mouth every 8 (eight) hours as needed for nausea or vomiting.    . pantoprazole (PROTONIX) 40 MG tablet Take 1 tablet (40 mg total) by mouth 2 (two) times daily. 180 tablet 3  . potassium chloride (K-DUR) 10 MEQ tablet Take 1 tablet (10 mEq total) by mouth daily. 90 tablet 3  . pravastatin (PRAVACHOL) 40 MG tablet Take 1 tablet (40 mg total) by  mouth daily. 90 tablet 3  . traMADol (ULTRAM) 50 MG tablet TAKE 1 TABLET BY MOUTH EVERY 6 HOURS AS NEEDED FOR  CHRONIC  BACK  PAIN 60 tablet 0  . TRUEPLUS LANCETS 30G MISC Use as directed to monitor FSBS 3x daily. Dx: E11.65 200 each 3   No current facility-administered medications on file prior to visit.    Allergies  Allergen Reactions  . Codeine Anaphylaxis and Hives  . Penicillins Anaphylaxis, Hives and Other (See Comments)    Has patient had a PCN reaction causing immediate rash, facial/tongue/throat swelling, SOB or lightheadedness with hypotension: No Has patient had a PCN reaction causing severe rash involving mucus membranes or skin necrosis: No Has patient had a PCN reaction that required hospitalization: No Has patient had a PCN reaction occurring within the last 10 years: No If all of the above answers are "NO", then may proceed with Cephalosporin use.   . Food Nausea And Vomiting and Other (See Comments)    Olives   . Januvia [Sitagliptin] Other (See Comments)    Stomach cramps  . Latex Hives   Social History   Socioeconomic History  . Marital status: Single    Spouse name: Not on file  . Number of children: 0  . Years of education: Not on file  . Highest education level: Not on file  Occupational History  . Occupation: retired  Engineer, productionocial Needs  . Financial resource strain: Not on file  . Food insecurity:    Worry: Not on file    Inability: Not on file  . Transportation needs:    Medical: Not on file    Non-medical: Not on file  Tobacco Use  . Smoking status: Never Smoker  . Smokeless tobacco: Never Used  Substance and Sexual Activity  . Alcohol use: No    Comment: social drinker.  . Drug use: No  . Sexual activity: Not on file  Lifestyle  . Physical activity:    Days per week: Not on file    Minutes per session: Not on file  . Stress: Not on file  Relationships  . Social connections:    Talks on phone: Not on file    Gets together: Not on file     Attends religious service: Not on file    Active member of club or organization: Not on file    Attends meetings of clubs or organizations: Not on file    Relationship status: Not on file  . Intimate partner violence:    Fear of current or ex partner: Not on file    Emotionally  abused: Not on file    Physically abused: Not on file    Forced sexual activity: Not on file  Other Topics Concern  . Not on file  Social History Narrative  . Not on file   Family History  Problem Relation Age of Onset  . Hyperlipidemia Mother   . Heart disease Father   . Hyperlipidemia Father   . Hypertension Father   . Heart disease Brother   . Stroke Brother   . Kidney disease Sister   . Kidney cancer Sister   . Ovarian cancer Maternal Aunt   . Kidney disease Maternal Aunt        x 2  . Kidney disease Maternal Uncle        x 2  . Colon cancer Neg Hx   . Stomach cancer Neg Hx       Review of Systems  Musculoskeletal: Positive for back pain.  All other systems reviewed and are negative.      Objective:   Physical Exam  Constitutional: She is oriented to person, place, and time. She appears well-developed and well-nourished. No distress.  HENT:  Head: Normocephalic and atraumatic.  Right Ear: External ear normal.  Left Ear: External ear normal.  Nose: Nose normal.  Mouth/Throat: Oropharynx is clear and moist. No oropharyngeal exudate.  Eyes: Pupils are equal, round, and reactive to light. Conjunctivae and EOM are normal. Right eye exhibits no discharge. Left eye exhibits no discharge. No scleral icterus.  Neck: Normal range of motion. Neck supple. No JVD present. No tracheal deviation present. No thyromegaly present.  Cardiovascular: Normal rate, regular rhythm, normal heart sounds and intact distal pulses. Exam reveals no gallop and no friction rub.  No murmur heard. Pulmonary/Chest: Effort normal and breath sounds normal. No stridor. No respiratory distress. She has no wheezes. She has  no rales. She exhibits no tenderness.  Abdominal: Soft. Bowel sounds are normal. She exhibits no distension and no mass. There is no abdominal tenderness. There is no rebound and no guarding.  Musculoskeletal: Normal range of motion.        General: No tenderness or edema.  Lymphadenopathy:    She has no cervical adenopathy.  Neurological: She is alert and oriented to person, place, and time. She has normal reflexes. No cranial nerve deficit. She exhibits normal muscle tone. Coordination normal.  Skin: Skin is warm. No rash noted. She is not diaphoretic. No erythema. No pallor.  Psychiatric: She has a normal mood and affect. Her behavior is normal. Judgment and thought content normal.  Vitals reviewed. No CVA tenderness        Assessment & Plan:   Gross hematuria - Plan: Urinalysis, Routine w reflex microscopic  Given the gross hematuria, and the right flank pain, I suspect the patient has nephrolithiasis.  Begin Flomax 0.4 mg p.o. daily.  Push fluids.  Temporarily hold Lasix and metformin until situation resolves.  Use Dilaudid 4 mg every 8 hours as needed for severe pain.  Obtain CT scan renal stone protocol to evaluate further given the fact that the symptoms have been there for more than 2 weeks and the pain does not seem to be moving or showing any signs of the stone is trying to pass.

## 2018-10-29 ENCOUNTER — Ambulatory Visit: Admission: RE | Admit: 2018-10-29 | Payer: Medicare HMO | Source: Ambulatory Visit

## 2018-10-29 ENCOUNTER — Other Ambulatory Visit: Payer: Self-pay | Admitting: Family Medicine

## 2018-10-29 ENCOUNTER — Ambulatory Visit
Admission: RE | Admit: 2018-10-29 | Discharge: 2018-10-29 | Disposition: A | Discharge status: Home / Self Care | Payer: Medicare HMO | Source: Ambulatory Visit | Attending: Family Medicine | Admitting: Family Medicine

## 2018-10-29 DIAGNOSIS — R319 Hematuria, unspecified: Secondary | ICD-10-CM | POA: Diagnosis not present

## 2018-10-29 DIAGNOSIS — R310 Gross hematuria: Secondary | ICD-10-CM

## 2018-10-29 DIAGNOSIS — K760 Fatty (change of) liver, not elsewhere classified: Secondary | ICD-10-CM | POA: Diagnosis not present

## 2018-10-30 ENCOUNTER — Other Ambulatory Visit: Payer: Self-pay | Admitting: Family Medicine

## 2018-10-30 DIAGNOSIS — N390 Urinary tract infection, site not specified: Secondary | ICD-10-CM

## 2018-10-30 DIAGNOSIS — I10 Essential (primary) hypertension: Secondary | ICD-10-CM

## 2018-10-30 DIAGNOSIS — Z794 Long term (current) use of insulin: Secondary | ICD-10-CM

## 2018-10-30 DIAGNOSIS — E7800 Pure hypercholesterolemia, unspecified: Secondary | ICD-10-CM

## 2018-10-30 DIAGNOSIS — R911 Solitary pulmonary nodule: Secondary | ICD-10-CM

## 2018-10-30 DIAGNOSIS — E118 Type 2 diabetes mellitus with unspecified complications: Secondary | ICD-10-CM

## 2018-10-30 MED ORDER — SULFAMETHOXAZOLE-TRIMETHOPRIM 800-160 MG PO TABS: 1.0000 | ORAL_TABLET | Freq: Two times a day (BID) | ORAL | 0 refills | Status: DC

## 2018-11-12 ENCOUNTER — Other Ambulatory Visit: Payer: Medicare HMO

## 2018-11-12 ENCOUNTER — Other Ambulatory Visit: Payer: Self-pay

## 2018-11-12 DIAGNOSIS — E118 Type 2 diabetes mellitus with unspecified complications: Secondary | ICD-10-CM | POA: Diagnosis not present

## 2018-11-12 DIAGNOSIS — I10 Essential (primary) hypertension: Secondary | ICD-10-CM | POA: Diagnosis not present

## 2018-11-12 DIAGNOSIS — E7800 Pure hypercholesterolemia, unspecified: Secondary | ICD-10-CM | POA: Diagnosis not present

## 2018-11-12 DIAGNOSIS — N390 Urinary tract infection, site not specified: Secondary | ICD-10-CM

## 2018-11-12 DIAGNOSIS — Z794 Long term (current) use of insulin: Secondary | ICD-10-CM | POA: Diagnosis not present

## 2018-11-12 LAB — URINALYSIS, ROUTINE W REFLEX MICROSCOPIC
Bilirubin Urine: NEGATIVE
Glucose, UA: NEGATIVE
Hgb urine dipstick: NEGATIVE
Hyaline Cast: NONE SEEN /LPF
Ketones, ur: NEGATIVE
Nitrite: NEGATIVE
PH: 6 (ref 5.0–8.0)
Protein, ur: NEGATIVE
RBC / HPF: NONE SEEN /HPF (ref 0–2)
SPECIFIC GRAVITY, URINE: 1.025 (ref 1.001–1.03)

## 2018-11-12 LAB — MICROSCOPIC MESSAGE

## 2018-11-13 LAB — HEMOGLOBIN A1C
EAG (MMOL/L): 9.2 (calc)
Hgb A1c MFr Bld: 7.4 % of total Hgb — ABNORMAL HIGH (ref <5.7)
Mean Plasma Glucose: 166 (calc)

## 2018-11-13 LAB — CBC WITH DIFFERENTIAL/PLATELET
Absolute Monocytes: 348 cells/uL (ref 200–950)
Basophils Absolute: 81 cells/uL (ref 0–200)
Basophils Relative: 1.1 %
Eosinophils Absolute: 111 cells/uL (ref 15–500)
Eosinophils Relative: 1.5 %
HCT: 37.1 % (ref 35.0–45.0)
Hemoglobin: 12.5 g/dL (ref 11.7–15.5)
Lymphs Abs: 1983 cells/uL (ref 850–3900)
MCH: 30.6 pg (ref 27.0–33.0)
MCHC: 33.7 g/dL (ref 32.0–36.0)
MCV: 90.9 fL (ref 80.0–100.0)
MPV: 10.7 fL (ref 7.5–12.5)
Monocytes Relative: 4.7 %
Neutro Abs: 4877 cells/uL (ref 1500–7800)
Neutrophils Relative %: 65.9 %
Platelets: 117 10*3/uL — ABNORMAL LOW (ref 140–400)
RBC: 4.08 10*6/uL (ref 3.80–5.10)
RDW: 12.4 % (ref 11.0–15.0)
Total Lymphocyte: 26.8 %
WBC: 7.4 10*3/uL (ref 3.8–10.8)

## 2018-11-13 LAB — COMPREHENSIVE METABOLIC PANEL
AG Ratio: 1.4 (calc) (ref 1.0–2.5)
ALT: 20 U/L (ref 6–29)
AST: 26 U/L (ref 10–35)
Albumin: 3.7 g/dL (ref 3.6–5.1)
Alkaline phosphatase (APISO): 77 U/L (ref 37–153)
BUN: 12 mg/dL (ref 7–25)
CO2: 27 mmol/L (ref 20–32)
Calcium: 8.7 mg/dL (ref 8.6–10.4)
Chloride: 103 mmol/L (ref 98–110)
Creat: 0.95 mg/dL (ref 0.50–0.99)
Globulin: 2.7 g/dL (calc) (ref 1.9–3.7)
Glucose, Bld: 163 mg/dL — ABNORMAL HIGH (ref 65–99)
Potassium: 4.9 mmol/L (ref 3.5–5.3)
Sodium: 138 mmol/L (ref 135–146)
Total Bilirubin: 0.6 mg/dL (ref 0.2–1.2)
Total Protein: 6.4 g/dL (ref 6.1–8.1)

## 2018-11-13 LAB — LIPID PANEL
Cholesterol: 157 mg/dL (ref <200)
HDL: 31 mg/dL — ABNORMAL LOW (ref >=50)
LDL Cholesterol (Calc): 105 mg/dL (calc) — ABNORMAL HIGH
Non-HDL Cholesterol (Calc): 126 mg/dL (calc) (ref <130)
Total CHOL/HDL Ratio: 5.1 (calc) — ABNORMAL HIGH (ref <5.0)
Triglycerides: 111 mg/dL (ref <150)

## 2018-11-17 ENCOUNTER — Other Ambulatory Visit: Payer: Medicare HMO

## 2018-11-20 ENCOUNTER — Ambulatory Visit: Payer: Medicare HMO | Admitting: Family Medicine

## 2018-11-20 ENCOUNTER — Other Ambulatory Visit: Payer: Self-pay | Admitting: Family Medicine

## 2018-11-20 MED ORDER — CIPROFLOXACIN HCL 500 MG PO TABS: 500.0000 mg | ORAL_TABLET | Freq: Two times a day (BID) | ORAL | 0 refills | Status: DC

## 2018-11-25 ENCOUNTER — Telehealth: Payer: Self-pay | Admitting: Family Medicine

## 2018-11-25 MED ORDER — NITROFURANTOIN MONOHYD MACRO 100 MG PO CAPS: 100.0000 mg | ORAL_CAPSULE | Freq: Two times a day (BID) | ORAL | 0 refills | Status: AC

## 2018-11-25 NOTE — Telephone Encounter (Signed)
Pt called and states that she can not take the Cipro antibx that was called in for her as the last time she took this it made her lungs swell and would like to know if we can send in Macrobid? (see lab note)

## 2018-11-25 NOTE — Telephone Encounter (Signed)
Okay to change to macrobid 100mg  BID x 7 days  Put Cipro in allergy list or clarify the reaction

## 2018-11-25 NOTE — Telephone Encounter (Signed)
Med sent to pharm, pt aware and Cipro added to allergy list - it caused her tongue to swell

## 2018-12-03 ENCOUNTER — Ambulatory Visit: Payer: Medicare HMO | Admitting: Family Medicine

## 2018-12-03 ENCOUNTER — Other Ambulatory Visit: Payer: Self-pay

## 2018-12-07 ENCOUNTER — Other Ambulatory Visit: Payer: Medicare HMO

## 2018-12-14 ENCOUNTER — Ambulatory Visit (INDEPENDENT_AMBULATORY_CARE_PROVIDER_SITE_OTHER): Payer: Medicare HMO | Admitting: Family Medicine

## 2018-12-14 ENCOUNTER — Other Ambulatory Visit: Payer: Self-pay | Admitting: Family Medicine

## 2018-12-14 ENCOUNTER — Other Ambulatory Visit: Payer: Self-pay

## 2018-12-14 DIAGNOSIS — E7800 Pure hypercholesterolemia, unspecified: Secondary | ICD-10-CM | POA: Diagnosis not present

## 2018-12-14 DIAGNOSIS — I10 Essential (primary) hypertension: Secondary | ICD-10-CM | POA: Diagnosis not present

## 2018-12-14 DIAGNOSIS — R310 Gross hematuria: Secondary | ICD-10-CM

## 2018-12-14 DIAGNOSIS — E118 Type 2 diabetes mellitus with unspecified complications: Secondary | ICD-10-CM

## 2018-12-14 DIAGNOSIS — Z794 Long term (current) use of insulin: Secondary | ICD-10-CM

## 2018-12-14 DIAGNOSIS — B349 Viral infection, unspecified: Secondary | ICD-10-CM

## 2018-12-14 NOTE — Progress Notes (Signed)
Subjective:    Patient ID: Linda Briggs, female    DOB: 1955-12-29, 63 y.o.   MRN: [id number]  10/22/18 Patient symptoms began approximately 2 weeks ago.  Patient developed severe sharp pain in her right flank near the right kidney.  The pain was intense.  It was not relieved or exacerbated by anything.  The pain lasted 45 minutes and spontaneously relieved.  The next day the pain returned.  Shortly thereafter she developed gross hematuria.  On 3 separate occasions she seen gross hematuria.  The pain comes and goes over the last 2 weeks.  It is always located in the right flank.  It is never associated with movement.  There are no exacerbating or alleviating factors.  It comes and goes like a toothache.  It is sharp and intense when it occurs.  She denies any dysuria.  Urinalysis today is completely clear.  She denies any nausea or vomiting however when the pain occurs it is so severe that the patient will break out in a cold sweat.  At that time, my plan was: Given the gross hematuria, and the right flank pain, I suspect the patient has nephrolithiasis.  Begin Flomax 0.4 mg p.o. daily.  Push fluids.  Temporarily hold Lasix and metformin until situation resolves.  Use Dilaudid 4 mg every 8 hours as needed for severe pain.  Obtain CT scan renal stone protocol to evaluate further given the fact that the symptoms have been there for more than 2 weeks and the pain does not seem to be moving or showing any signs of the stone is trying to pass.  12/14/18 Patient is being seen today over the telephone for follow-up.  Patient consents to be seen over the telephone.  Phone call began at 1206.  Phone call concluded at 1230.  Patient had a CT scan due to her hematuria.  No nephrolithiasis was seen however there was stranding around the bladder suggesting a urinary tract infection.  We treated this with Bactrim.  Her hematuria resolved after being on the antibiotic for 1 day.  There was a coincidental finding of a 6  mm x 8 mm pulmonary nodule.  She has a CT scan to evaluate this further in May.  Since I last saw the patient, she believes she contracted coronavirus.  She developed fever to 103.8.  She developed cough and mild shortness of breath.  She developed loss of taste and dyschezia.  She also developed severe diarrhea.  Symptoms lasted approximately 1 week and gradually subsided.  She has been asymptomatic now for approximately 2 weeks.  She denies any chest pain or shortness of breath or dyspnea on exertion.  Today's visit was primarily to discuss her recent lab work that was obtained in March.  At that time, the patient had been off metformin for more than a month as we discontinued metformin in February.  She is also only been taking Trulicity 0.75 mg due to nausea.  Her hemoglobin A1c had risen to 7.4.  Her LDL cholesterol was mildly elevated at 105.  Her HDL was low at 31.  Patient's diarrhea has subsided now and been gone for more than 2 weeks.  She is willing to resume her metformin.  She also believes that she can tolerate the higher dose Trulicity.  In fact she has tried this now for approximately 1 month and seems to be tolerating it well.  Today she checked her weight and it was 304 pounds.  By her scale  she has lost 4 pounds.  Her heart rate is 80 bpm.  Her blood pressure is 116/65.  Past Medical History:  Diagnosis Date  . Arthritis    feet & toes  . Bulging disc   . Diabetes mellitus   . Gastroparesis   . History of hepatitis B   . Hyperlipidemia   . Hypertension   . Neuromuscular disorder (HCC)    in feet and fingers   . Neuropathy    feet  . Nonproliferative diabetic retinopathy (HCC)   . 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 Peripheral vascular disease (HCC)   . Shortness of breath dyspnea    on lopressor   . Sleep apnea    pt had study done but it was incomplete due to blood sugar issues    Past Surgical History:  Procedure Laterality Date  . BIOPSY  04/28/2018    Procedure: BIOPSY;  Surgeon: Meryl DareStark, Malcolm T, MD;  [facility];  Service: Endoscopy;;  . BIOPSY THYROID     january 2016  . ESOPHAGOGASTRODUODENOSCOPY (EGD) WITH PROPOFOL N/A 04/28/2018   Procedure: ESOPHAGOGASTRODUODENOSCOPY (EGD) WITH PROPOFOL;  Surgeon: Meryl DareStark, Malcolm T, MD;  Location: [facility];  Service: Endoscopy;  Laterality: N/A;  . HYSTEROSCOPY W/D&C N/A 02/15/2015   Procedure: DILATATION AND CURETTAGE /HYSTEROSCOPY;  Surgeon: Mitchel HonourMegan Morris, DO;  Location: [facility];  Service: Gynecology;  Laterality: N/A;  . TONSILLECTOMY     Current Outpatient Medications on File Prior to Visit  Medication Sig Dispense Refill  . Alcohol Swabs (B-D SINGLE USE SWABS REGULAR) PADS Use to monitor FSBS 3x daily for fluctuating FSBS. DX: 250.00 300 each 1  . aspirin EC 81 MG tablet Take 81 mg by mouth daily.    . Blood Glucose Calibration (TRUE METRIX LEVEL 2) Normal SOLN Use as directed to monitor FSBS 3x daily. Dx: E11.65 1 each 1  . Blood Glucose Monitoring Suppl (TRUE METRIX AIR GLUCOSE METER) DEVI 1 each by Does not apply route daily. Use as directed to monitor FSBS 3x daily. Dx: E11.65 1 Device 1  . Dulaglutide 0.75 MG/0.5ML SOPN INJECT  0.75MG   INTO  THE  SKIN  ONCE  A  WEEK 12 pen 2  . furosemide (LASIX) 20 MG tablet TAKE 1 TABLET EVERY DAY 90 tablet 3  . gabapentin (NEURONTIN) 300 MG capsule TAKE 1 CAPSULE THREE TIMES DAILY 270 capsule 1  . glucose blood (TRUE METRIX BLOOD GLUCOSE TEST) test strip Use as directed to monitor FSBS 3x daily. Dx: E11.65 300 each 3  . HYDROmorphone (DILAUDID) 4 MG tablet Take 1 tablet (4 mg total) by mouth every 6 (six) hours as needed for severe pain. 30 tablet 0  . lisinopril (PRINIVIL,ZESTRIL) 5 MG tablet Take 1 tablet (5 mg total) by mouth daily. 90 tablet 3  . metFORMIN (GLUCOPHAGE) 1000 MG tablet TAKE 1 TABLET TWICE DAILY WITH A MEAL (Patient taking differently: Take 1,000 mg by mouth 2 (two) times daily with a meal. ) 180 tablet 3  . metoprolol tartrate  (LOPRESSOR) 50 MG tablet Take 1 tablet (50 mg total) by mouth 2 (two) times daily. 180 tablet 3  . ondansetron (ZOFRAN) 4 MG tablet Take 4 mg by mouth every 8 (eight) hours as needed for nausea or vomiting.    . pantoprazole (PROTONIX) 40 MG tablet Take 1 tablet (40 mg total) by mouth 2 (two) times daily. 180 tablet 3  . potassium chloride (K-DUR) 10 MEQ tablet Take 1 tablet (10 mEq  total) by mouth daily. 90 tablet 3  . pravastatin (PRAVACHOL) 40 MG tablet Take 1 tablet (40 mg total) by mouth daily. 90 tablet 3  . tamsulosin (FLOMAX) 0.4 MG CAPS capsule Take 1 capsule (0.4 mg total) by mouth daily. 30 capsule 0  . traMADol (ULTRAM) 50 MG tablet TAKE 1 TABLET BY MOUTH EVERY 6 HOURS AS NEEDED FOR  CHRONIC  BACK  PAIN 60 tablet 0  . TRUEPLUS LANCETS 30G MISC Use as directed to monitor FSBS 3x daily. Dx: E11.65 200 each 3   No current facility-administered medications on file prior to visit.    Allergies  Allergen Reactions  . Codeine Anaphylaxis and Hives  . Penicillins Anaphylaxis, Hives and Other (See Comments)    Has patient had a PCN reaction causing immediate rash, facial/tongue/throat swelling, SOB or lightheadedness with hypotension: No Has patient had a PCN reaction causing severe rash involving mucus membranes or skin necrosis: No Has patient had a PCN reaction that required hospitalization: No Has patient had a PCN reaction occurring within the last 10 years: No If all of the above answers are "NO", then may proceed with Cephalosporin use.   . Ciprofloxacin Other (See Comments)    Tongue Swelling  . Food Nausea And Vomiting and Other (See Comments)    Olives   . Januvia [Sitagliptin] Other (See Comments)    Stomach cramps  . Latex Hives   Social History   Socioeconomic History  . Marital status: Single    Spouse name: Not on file  . Number of children: 0  . Years of education: Not on file  . Highest education level: Not on file  Occupational History  . Occupation: retired   Engineer, production  . Financial resource strain: Not on file  . Food insecurity:    Worry: Not on file    Inability: Not on file  . Transportation needs:    Medical: Not on file    Non-medical: Not on file  Tobacco Use  . Smoking status: Never Smoker  . Smokeless tobacco: Never Used  Substance and Sexual Activity  . Alcohol use: No    Comment: social drinker.  . Drug use: No  . Sexual activity: Not on file  Lifestyle  . Physical activity:    Days per week: Not on file    Minutes per session: Not on file  . Stress: Not on file  Relationships  . Social connections:    Talks on phone: Not on file    Gets together: Not on file    Attends religious service: Not on file    Active member of club or organization: Not on file    Attends meetings of clubs or organizations: Not on file    Relationship status: Not on file  . Intimate partner violence:    Fear of current or ex partner: Not on file    Emotionally abused: Not on file    Physically abused: Not on file    Forced sexual activity: Not on file  Other Topics Concern  . Not on file  Social History Narrative  . Not on file   Family History  Problem Relation Age of Onset  . Hyperlipidemia Mother   . Heart disease Father   . Hyperlipidemia Father   . Hypertension Father   . Heart disease Brother   . Stroke Brother   . Kidney disease Sister   . Kidney cancer Sister   . Ovarian cancer Maternal Aunt   . Kidney  disease Maternal Aunt        x 2  . Kidney disease Maternal Uncle        x 2  . Colon cancer Neg Hx   . Stomach cancer Neg Hx       Review of Systems  All other systems reviewed and are negative.      Objective:      No physical exam was performed today as this was a telephone visit Assessment & Plan:   Type 2 diabetes mellitus with complication, with long-term current use of insulin (HCC)  Essential hypertension  Pure hypercholesterolemia  Gross hematuria  Viral syndrome  Gross hematuria  resolved after treatment for her urinary tract infection.  CT scan revealed no nephrolithiasis.  At the present time I see no role for cystoscopy as her symptoms have abated.  Patient certainly had symptoms consistent with a viral syndrome that could be classic for corona virus.  However she is now clinically improved and has been asymptomatic for 2 weeks.  No further testing or treatment is necessary.  Her blood pressure at home today is well controlled at 116/65.  I will have the patient resume metformin 1000 mg twice daily and also increase her Trulicity to 1.5 mg weekly to manage her diabetes.  I would recheck her hemoglobin A1c and fasting lab work in 3 months.  Hopefully at that time her hemoglobin A1c will be closer to 6.5.  Cholesterol is acceptable with an LDL of 105.  I believe with additional weight loss, the patient will be able to get her LDL below 100 which is her goal.

## 2018-12-25 ENCOUNTER — Other Ambulatory Visit: Payer: Self-pay | Admitting: Family Medicine

## 2018-12-25 ENCOUNTER — Telehealth: Payer: Self-pay | Admitting: Family Medicine

## 2018-12-25 MED ORDER — SULFAMETHOXAZOLE-TRIMETHOPRIM 800-160 MG PO TABS: 1.0000 | ORAL_TABLET | Freq: Two times a day (BID) | ORAL | 0 refills | Status: DC

## 2018-12-25 NOTE — Telephone Encounter (Signed)
I will send in bactrim  

## 2018-12-25 NOTE — Telephone Encounter (Signed)
Pt called and states that she thinks she has another bladder infection - pain & frequent urination along with some blood visible on pad.

## 2018-12-25 NOTE — Telephone Encounter (Signed)
Pt aware.

## 2019-01-19 ENCOUNTER — Other Ambulatory Visit: Payer: Medicare HMO

## 2019-01-21 ENCOUNTER — Other Ambulatory Visit: Payer: Self-pay | Admitting: Family Medicine

## 2019-01-22 ENCOUNTER — Other Ambulatory Visit: Payer: Self-pay

## 2019-01-22 ENCOUNTER — Ambulatory Visit
Admission: RE | Admit: 2019-01-22 | Discharge: 2019-01-22 | Disposition: A | Discharge status: Home / Self Care | Payer: Medicare HMO | Source: Ambulatory Visit | Attending: Family Medicine | Admitting: Family Medicine

## 2019-01-22 DIAGNOSIS — R911 Solitary pulmonary nodule: Secondary | ICD-10-CM | POA: Diagnosis not present

## 2019-01-27 ENCOUNTER — Other Ambulatory Visit: Payer: Self-pay | Admitting: Family Medicine

## 2019-01-27 DIAGNOSIS — R911 Solitary pulmonary nodule: Secondary | ICD-10-CM

## 2019-01-27 MED ORDER — DULAGLUTIDE 1.5 MG/0.5ML ~~LOC~~ SOAJ: 1.5000 mg | SUBCUTANEOUS | 3 refills | Status: DC

## 2019-03-10 ENCOUNTER — Other Ambulatory Visit: Payer: Self-pay | Admitting: Cardiovascular Disease

## 2019-03-10 ENCOUNTER — Other Ambulatory Visit: Payer: Self-pay | Admitting: Family Medicine

## 2019-03-15 ENCOUNTER — Other Ambulatory Visit: Payer: Medicare HMO

## 2019-03-19 ENCOUNTER — Other Ambulatory Visit: Payer: Self-pay

## 2019-03-19 ENCOUNTER — Other Ambulatory Visit: Payer: Medicare HMO

## 2019-03-19 DIAGNOSIS — E118 Type 2 diabetes mellitus with unspecified complications: Secondary | ICD-10-CM

## 2019-03-19 DIAGNOSIS — E7800 Pure hypercholesterolemia, unspecified: Secondary | ICD-10-CM

## 2019-03-19 DIAGNOSIS — Z79899 Other long term (current) drug therapy: Secondary | ICD-10-CM | POA: Diagnosis not present

## 2019-03-19 DIAGNOSIS — Z794 Long term (current) use of insulin: Secondary | ICD-10-CM | POA: Diagnosis not present

## 2019-03-19 DIAGNOSIS — I10 Essential (primary) hypertension: Secondary | ICD-10-CM

## 2019-03-20 LAB — COMPREHENSIVE METABOLIC PANEL
AG Ratio: 1.4 (calc) (ref 1.0–2.5)
ALT: 17 U/L (ref 6–29)
AST: 19 U/L (ref 10–35)
Albumin: 3.7 g/dL (ref 3.6–5.1)
Alkaline phosphatase (APISO): 69 U/L (ref 37–153)
BUN/Creatinine Ratio: 11 (calc) (ref 6–22)
BUN: 12 mg/dL (ref 7–25)
CO2: 28 mmol/L (ref 20–32)
Calcium: 8.6 mg/dL (ref 8.6–10.4)
Chloride: 102 mmol/L (ref 98–110)
Creat: 1.08 mg/dL — ABNORMAL HIGH (ref 0.50–0.99)
Globulin: 2.6 g/dL (calc) (ref 1.9–3.7)
Glucose, Bld: 219 mg/dL — ABNORMAL HIGH (ref 65–99)
Potassium: 4.7 mmol/L (ref 3.5–5.3)
Sodium: 139 mmol/L (ref 135–146)
Total Bilirubin: 0.6 mg/dL (ref 0.2–1.2)
Total Protein: 6.3 g/dL (ref 6.1–8.1)

## 2019-03-20 LAB — CBC WITH DIFFERENTIAL/PLATELET
Absolute Monocytes: 466 cells/uL (ref 200–950)
Basophils Absolute: 117 cells/uL (ref 0–200)
Basophils Relative: 1.1 %
Eosinophils Absolute: 276 cells/uL (ref 15–500)
Eosinophils Relative: 2.6 %
HCT: 37.8 % (ref 35.0–45.0)
Hemoglobin: 13 g/dL (ref 11.7–15.5)
Lymphs Abs: 2671 cells/uL (ref 850–3900)
MCH: 31.6 pg (ref 27.0–33.0)
MCHC: 34.4 g/dL (ref 32.0–36.0)
MCV: 91.7 fL (ref 80.0–100.0)
MPV: 10.6 fL (ref 7.5–12.5)
Monocytes Relative: 4.4 %
Neutro Abs: 7070 cells/uL (ref 1500–7800)
Neutrophils Relative %: 66.7 %
Platelets: 105 10*3/uL — ABNORMAL LOW (ref 140–400)
RBC: 4.12 10*6/uL (ref 3.80–5.10)
RDW: 13.4 % (ref 11.0–15.0)
Total Lymphocyte: 25.2 %
WBC: 10.6 10*3/uL (ref 3.8–10.8)

## 2019-03-20 LAB — HEMOGLOBIN A1C
Hgb A1c MFr Bld: 7.6 % of total Hgb — ABNORMAL HIGH (ref <5.7)
Mean Plasma Glucose: 171 (calc)
eAG (mmol/L): 9.5 (calc)

## 2019-03-20 LAB — LIPID PANEL
Cholesterol: 151 mg/dL (ref <200)
HDL: 27 mg/dL — ABNORMAL LOW (ref >=50)
LDL Cholesterol (Calc): 96 mg/dL (calc)
Non-HDL Cholesterol (Calc): 124 mg/dL (calc) (ref <130)
Total CHOL/HDL Ratio: 5.6 (calc) — ABNORMAL HIGH (ref <5.0)
Triglycerides: 183 mg/dL — ABNORMAL HIGH (ref <150)

## 2019-03-23 ENCOUNTER — Other Ambulatory Visit: Payer: Self-pay | Admitting: Family Medicine

## 2019-03-23 ENCOUNTER — Telehealth: Payer: Self-pay | Admitting: Family Medicine

## 2019-03-23 DIAGNOSIS — R531 Weakness: Secondary | ICD-10-CM

## 2019-03-23 DIAGNOSIS — R0602 Shortness of breath: Secondary | ICD-10-CM

## 2019-03-23 DIAGNOSIS — R6883 Chills (without fever): Secondary | ICD-10-CM

## 2019-03-23 MED ORDER — GLIPIZIDE ER 5 MG PO TB24: 5.0000 mg | ORAL_TABLET | Freq: Every day | ORAL | 1 refills | Status: DC

## 2019-03-23 NOTE — Addendum Note (Signed)
Addended by: Shary Decamp B on: 03/23/2019 02:52 PM   Modules accepted: Orders

## 2019-03-23 NOTE — Telephone Encounter (Signed)
Pt aware and test ordered

## 2019-03-23 NOTE — Telephone Encounter (Signed)
Pt states that you thought she had COVID a while back and she is better but she has days that she feels like she is having "aftershock" from it. She will have a few days of chills, HA and fatigue and then it will go away and come back. She does not have air in her house so she is not sure why she would be having chills. Please advise.

## 2019-03-23 NOTE — Telephone Encounter (Signed)
Recommend we send her for covid testing.

## 2019-03-25 ENCOUNTER — Other Ambulatory Visit: Payer: Self-pay | Admitting: Family Medicine

## 2019-03-25 DIAGNOSIS — H40013 Open angle with borderline findings, low risk, bilateral: Secondary | ICD-10-CM | POA: Diagnosis not present

## 2019-03-25 DIAGNOSIS — Z20822 Contact with and (suspected) exposure to covid-19: Secondary | ICD-10-CM

## 2019-03-25 DIAGNOSIS — H25013 Cortical age-related cataract, bilateral: Secondary | ICD-10-CM | POA: Diagnosis not present

## 2019-03-25 DIAGNOSIS — H2513 Age-related nuclear cataract, bilateral: Secondary | ICD-10-CM | POA: Diagnosis not present

## 2019-03-25 DIAGNOSIS — E113393 Type 2 diabetes mellitus with moderate nonproliferative diabetic retinopathy without macular edema, bilateral: Secondary | ICD-10-CM | POA: Diagnosis not present

## 2019-03-25 LAB — HM DIABETES EYE EXAM

## 2019-03-26 ENCOUNTER — Encounter: Payer: Self-pay | Admitting: *Deleted

## 2019-03-28 LAB — NOVEL CORONAVIRUS, NAA: SARS-CoV-2, NAA: NOT DETECTED

## 2019-03-29 ENCOUNTER — Other Ambulatory Visit: Payer: Self-pay | Admitting: Cardiovascular Disease

## 2019-03-30 NOTE — Telephone Encounter (Signed)
Please review for refill.  

## 2019-04-20 ENCOUNTER — Telehealth: Payer: Self-pay

## 2019-04-20 NOTE — Telephone Encounter (Signed)
LVM for the Health Dept in [place] to call me back.

## 2019-04-20 NOTE — Telephone Encounter (Signed)
Spoke with Tammy at [facility] and she advised me that the tb nurse is looking into it and they just need confirmation that neighbor is active positive for tb and then they can get her tested for free.

## 2019-04-20 NOTE — Telephone Encounter (Signed)
Pt called to report that her neighbor has tested positive for TB and that she is around her neighbor a lot. Pt wanted to know if she can come in clinic to be tested.  After speaking with Lovey Newcomer and Dr. Buelah Manis, Per Dr. Buelah Manis, pt can not come in clinic and would have to call her health department to see what the protocol is. Pt was understanding.

## 2019-04-20 NOTE — Telephone Encounter (Signed)
I called and left VM for TB nurse at [facility] For now recommend patient stay within her home until we figure when she can be tested and further instructions

## 2019-05-17 ENCOUNTER — Telehealth: Payer: Self-pay | Admitting: Family Medicine

## 2019-05-17 NOTE — Telephone Encounter (Signed)
Patient left voicemail would like something called in for UTI she uses [facility] on [address].  CB# [phone]

## 2019-05-18 ENCOUNTER — Other Ambulatory Visit: Payer: Self-pay | Admitting: Family Medicine

## 2019-05-18 MED ORDER — SULFAMETHOXAZOLE-TRIMETHOPRIM 800-160 MG PO TABS: 1.0000 | ORAL_TABLET | Freq: Two times a day (BID) | ORAL | 0 refills | Status: DC

## 2019-06-10 ENCOUNTER — Other Ambulatory Visit: Payer: Self-pay | Admitting: Family Medicine

## 2019-06-17 ENCOUNTER — Other Ambulatory Visit: Payer: Self-pay

## 2019-06-17 ENCOUNTER — Other Ambulatory Visit: Payer: Medicare HMO

## 2019-06-21 ENCOUNTER — Ambulatory Visit (INDEPENDENT_AMBULATORY_CARE_PROVIDER_SITE_OTHER): Payer: Medicare HMO | Admitting: Family Medicine

## 2019-06-21 ENCOUNTER — Other Ambulatory Visit: Payer: Self-pay

## 2019-06-21 VITALS — BP 110/70 | HR 76 | Temp 97.4°F | Resp 18 | Ht 66.0 in | Wt 309.0 lb

## 2019-06-21 DIAGNOSIS — E1140 Type 2 diabetes mellitus with diabetic neuropathy, unspecified: Secondary | ICD-10-CM | POA: Diagnosis not present

## 2019-06-21 DIAGNOSIS — E118 Type 2 diabetes mellitus with unspecified complications: Secondary | ICD-10-CM

## 2019-06-21 DIAGNOSIS — Z6841 Body Mass Index (BMI) 40.0 and over, adult: Secondary | ICD-10-CM | POA: Diagnosis not present

## 2019-06-21 DIAGNOSIS — Z794 Long term (current) use of insulin: Secondary | ICD-10-CM

## 2019-06-21 DIAGNOSIS — I10 Essential (primary) hypertension: Secondary | ICD-10-CM

## 2019-06-21 DIAGNOSIS — R5081 Fever presenting with conditions classified elsewhere: Secondary | ICD-10-CM

## 2019-06-21 DIAGNOSIS — E7800 Pure hypercholesterolemia, unspecified: Secondary | ICD-10-CM

## 2019-06-21 NOTE — Progress Notes (Signed)
Subjective:    Patient ID: Linda Briggs, female    DOB: [birth year], 63 y.o.   MRN: [id number]  Patient has a history of diabetes mellitus type 2 currently on a combination of Trulicity, Metformin, and glipizide.  She has a history of nonproliferative diabetic retinopathy.  She also has severe diabetic neuropathy.  She has essentially no sensation in her toes on either foot.  She also occasionally gets burning pain in her feet.  Her blood pressure today is well controlled at 110/70.  She does occasionally report discharge from her umbilicus.  On examination today there is examined formed skin within the bellybutton but there is no erythema or warmth.  The patient reports occasional crusting similar to impetigo.  I question if it could be some mild intertrigo due to obesity versus occasional staph impetigo causing this.  She denies any polyuria, polydipsia, or blurry vision.  However she states that when she checks her blood sugars in the morning they average around 150.  She seldom sees a blood sugar greater than 200.  She denies any episodes of hypoglycemia.  She denies any myalgias or right upper quadrant pain.  She is due for her flu shot but she prefers to get the high-dose flu vaccine and today we are out. Past Medical History:  Diagnosis Date  . Arthritis    feet & toes  . Bulging disc   . Diabetes mellitus   . Gastroparesis   . History of hepatitis B   . Hyperlipidemia   . Hypertension   . Neuromuscular disorder ([place])    in feet and fingers   . Neuropathy    feet  . Nonproliferative diabetic retinopathy ([place])   . 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 Peripheral vascular disease ([place])   . Shortness of breath dyspnea    on lopressor   . Sleep apnea    pt had study done but it was incomplete due to blood sugar issues    Past Surgical History:  Procedure Laterality Date  . BIOPSY  04/28/2018   Procedure: BIOPSY;  Surgeon: Ladene Artist, MD;  Location: [facility];   Service: Endoscopy;;  . BIOPSY THYROID     january 2016  . ESOPHAGOGASTRODUODENOSCOPY (EGD) WITH PROPOFOL N/A 04/28/2018   Procedure: ESOPHAGOGASTRODUODENOSCOPY (EGD) WITH PROPOFOL;  Surgeon: Ladene Artist, MD;  Location: [facility];  Service: Endoscopy;  Laterality: N/A;  . HYSTEROSCOPY W/D&C N/A 02/15/2015   Procedure: DILATATION AND CURETTAGE /HYSTEROSCOPY;  Surgeon: Linda Hedges, DO;  Location: [facility];  Service: Gynecology;  Laterality: N/A;  . TONSILLECTOMY     Current Outpatient Medications on File Prior to Visit  Medication Sig Dispense Refill  . Alcohol Swabs (B-D SINGLE USE SWABS REGULAR) PADS Use to monitor FSBS 3x daily for fluctuating FSBS. DX: 250.00 300 each 1  . aspirin EC 81 MG tablet Take 81 mg by mouth daily.    . Blood Glucose Calibration (TRUE METRIX LEVEL 2) Normal SOLN Use as directed to monitor FSBS 3x daily. Dx: E11.65 1 each 1  . Blood Glucose Monitoring Suppl (TRUE METRIX AIR GLUCOSE METER) DEVI 1 each by Does not apply route daily. Use as directed to monitor FSBS 3x daily. Dx: E11.65 1 Device 1  . Dulaglutide (TRULICITY) 1.5 TI/4.5YK SOPN Inject 1.5 mg into the skin once a week. 12 pen 3  . Dulaglutide 0.75 MG/0.5ML SOPN INJECT  0.75MG   INTO  THE  SKIN  ONCE  A  WEEK  12 pen 2  . furosemide (LASIX) 20 MG tablet TAKE 1 TABLET EVERY DAY 90 tablet 3  . gabapentin (NEURONTIN) 300 MG capsule TAKE 1 CAPSULE THREE TIMES DAILY 270 capsule 1  . glipiZIDE (GLUCOTROL XL) 5 MG 24 hr tablet Take 1 tablet (5 mg total) by mouth daily with breakfast. 90 tablet 1  . glucose blood (TRUE METRIX BLOOD GLUCOSE TEST) test strip Use as directed to monitor FSBS 3x daily. Dx: E11.65 300 each 3  . lisinopril (ZESTRIL) 5 MG tablet TAKE 1 TABLET EVERY DAY 90 tablet 3  . metFORMIN (GLUCOPHAGE) 1000 MG tablet TAKE 1 TABLET TWICE DAILY WITH A MEAL 180 tablet 1  . metoprolol tartrate (LOPRESSOR) 50 MG tablet TAKE 1 TABLET TWICE DAILY. PATIENT NEEDS TO CONTACT OFFICE TO SCHEDULE FUTURE APPOINTMENT  AND REFILLS. 30 tablet 0  . ondansetron (ZOFRAN) 4 MG tablet Take 4 mg by mouth every 8 (eight) hours as needed for nausea or vomiting.    . pantoprazole (PROTONIX) 40 MG tablet TAKE 1 TABLET TWICE DAILY 180 tablet 3  . potassium chloride (K-DUR) 10 MEQ tablet Take 1 tablet (10 mEq total) by mouth daily. 90 tablet 3  . potassium chloride (K-DUR,KLOR-CON) 10 MEQ tablet TAKE 1 TABLET EVERY DAY 90 tablet 3  . pravastatin (PRAVACHOL) 40 MG tablet TAKE 1 TABLET EVERY DAY 90 tablet 3  . sulfamethoxazole-trimethoprim (BACTRIM DS) 800-160 MG tablet Take 1 tablet by mouth 2 (two) times daily. 14 tablet 0  . sulfamethoxazole-trimethoprim (BACTRIM DS) 800-160 MG tablet Take 1 tablet by mouth 2 (two) times daily. 10 tablet 0  . traMADol (ULTRAM) 50 MG tablet TAKE 1 TABLET BY MOUTH EVERY 6 HOURS AS NEEDED FOR  CHRONIC  BACK  PAIN 60 tablet 0  . TRUEPLUS LANCETS 30G MISC Use as directed to monitor FSBS 3x daily. Dx: E11.65 200 each 3   No current facility-administered medications on file prior to visit.    Allergies  Allergen Reactions  . Codeine Anaphylaxis and Hives  . Penicillins Anaphylaxis, Hives and Other (See Comments)    Has patient had a PCN reaction causing immediate rash, facial/tongue/throat swelling, SOB or lightheadedness with hypotension: No Has patient had a PCN reaction causing severe rash involving mucus membranes or skin necrosis: No Has patient had a PCN reaction that required hospitalization: No Has patient had a PCN reaction occurring within the last 10 years: No If all of the above answers are "NO", then may proceed with Cephalosporin use.   . Ciprofloxacin Other (See Comments)    Tongue Swelling  . Food Nausea And Vomiting and Other (See Comments)    Olives   . Januvia [Sitagliptin] Other (See Comments)    Stomach cramps  . Latex Hives   Social History   Socioeconomic History  . Marital status: Single    Spouse name: Not on file  . Number of children: 0  . Years of  education: Not on file  . Highest education level: Not on file  Occupational History  . Occupation: retired  Engineer, production  . Financial resource strain: Not on file  . Food insecurity    Worry: Not on file    Inability: Not on file  . Transportation needs    Medical: Not on file    Non-medical: Not on file  Tobacco Use  . Smoking status: Never Smoker  . Smokeless tobacco: Never Used  Substance and Sexual Activity  . Alcohol use: No    Comment: social drinker.  . Drug  use: No  . Sexual activity: Not on file  Lifestyle  . Physical activity    Days per week: Not on file    Minutes per session: Not on file  . Stress: Not on file  Relationships  . Social Musicianconnections    Talks on phone: Not on file    Gets together: Not on file    Attends religious service: Not on file    Active member of club or organization: Not on file    Attends meetings of clubs or organizations: Not on file    Relationship status: Not on file  . Intimate partner violence    Fear of current or ex partner: Not on file    Emotionally abused: Not on file    Physically abused: Not on file    Forced sexual activity: Not on file  Other Topics Concern  . Not on file  Social History Narrative  . Not on file   Family History  Problem Relation Age of Onset  . Hyperlipidemia Mother   . Heart disease Father   . Hyperlipidemia Father   . Hypertension Father   . Heart disease Brother   . Stroke Brother   . Kidney disease Sister   . Kidney cancer Sister   . Ovarian cancer Maternal Aunt   . Kidney disease Maternal Aunt        x 2  . Kidney disease Maternal Uncle        x 2  . Colon cancer Neg Hx   . Stomach cancer Neg Hx       Review of Systems  Musculoskeletal: Positive for back pain.  All other systems reviewed and are negative.      Objective:   Physical Exam  Constitutional: She is oriented to person, place, and time. She appears well-developed and well-nourished. No distress.  HENT:  Head:  Normocephalic and atraumatic.  Right Ear: External ear normal.  Left Ear: External ear normal.  Nose: Nose normal.  Mouth/Throat: Oropharynx is clear and moist. No oropharyngeal exudate.  Eyes: Pupils are equal, round, and reactive to light. Conjunctivae and EOM are normal. Right eye exhibits no discharge. Left eye exhibits no discharge. No scleral icterus.  Neck: Normal range of motion. Neck supple. No JVD present. No tracheal deviation present. No thyromegaly present.  Cardiovascular: Normal rate, regular rhythm, normal heart sounds and intact distal pulses. Exam reveals no gallop and no friction rub.  No murmur heard. Pulmonary/Chest: Effort normal and breath sounds normal. No stridor. No respiratory distress. She has no wheezes. She has no rales. She exhibits no tenderness.  Abdominal: Soft. Bowel sounds are normal. She exhibits no distension and no mass. There is no abdominal tenderness. There is no rebound and no guarding.  Musculoskeletal: Normal range of motion.        General: No tenderness or edema.  Lymphadenopathy:    She has no cervical adenopathy.  Neurological: She is alert and oriented to person, place, and time. She has normal reflexes. No cranial nerve deficit. She exhibits normal muscle tone. Coordination normal.  Skin: Skin is warm. No rash noted. She is not diaphoretic. No erythema. No pallor.  Psychiatric: She has a normal mood and affect. Her behavior is normal. Judgment and thought content normal.  Vitals reviewed.         Assessment & Plan:  Type 2 diabetes mellitus with complication, with long-term current use of insulin (HCC) - Plan: Hemoglobin A1c, COMPLETE METABOLIC PANEL WITH GFR, CBC with  Differential/Platelet, Lipid panel, Microalbumin, urine  Fever in other diseases - Plan: SAR CoV2 Serology (COVID 19)AB(IGG)IA Patient states that she believes she had COVID-19 earlier this spring.  However she was tested on 2 separate occasions and both times the Covid  test came back negative.  However she continues to have intermittent fevers, fatigue, body aches, and a dry nonproductive cough.  She had a CT scan of the lungs performed in May which did show a small 7 mm left lower lobe pulmonary nodule but otherwise was normal.  She has not had any other work-up for intermittent fevers.  She questions if she had COVID-19.  We discussed performing serology test for this today.  If the serology test are negative then her recurrent fevers or not due to COVID-19 and I would begin a work-up for fever of unknown origin with blood cultures, urine culture, and autoimmune work-up.  Patient would also need up-to-date cancer screening.  I will check a hemoglobin A1c today.  I will check a fasting lipid panel.  I will check a urine microalbumin.  If her hemoglobin A1c is elevated, I will add Actos.  Her goal LDL cholesterol is less than 100.  Her blood pressure today is acceptable.

## 2019-06-22 ENCOUNTER — Other Ambulatory Visit: Payer: Self-pay

## 2019-06-22 LAB — CBC WITH DIFFERENTIAL/PLATELET
Absolute Monocytes: 474 cells/uL (ref 200–950)
Basophils Absolute: 72 cells/uL (ref 0–200)
Basophils Relative: 0.7 %
Eosinophils Absolute: 185 cells/uL (ref 15–500)
Eosinophils Relative: 1.8 %
HCT: 38.3 % (ref 35.0–45.0)
Hemoglobin: 13 g/dL (ref 11.7–15.5)
Lymphs Abs: 2359 cells/uL (ref 850–3900)
MCH: 31.5 pg (ref 27.0–33.0)
MCHC: 33.9 g/dL (ref 32.0–36.0)
MCV: 92.7 fL (ref 80.0–100.0)
MPV: 10.5 fL (ref 7.5–12.5)
Monocytes Relative: 4.6 %
Neutro Abs: 7210 cells/uL (ref 1500–7800)
Neutrophils Relative %: 70 %
Platelets: 148 10*3/uL (ref 140–400)
RBC: 4.13 10*6/uL (ref 3.80–5.10)
RDW: 12.9 % (ref 11.0–15.0)
Total Lymphocyte: 22.9 %
WBC: 10.3 10*3/uL (ref 3.8–10.8)

## 2019-06-22 LAB — COMPLETE METABOLIC PANEL WITH GFR
AG Ratio: 1.4 (calc) (ref 1.0–2.5)
ALT: 22 U/L (ref 6–29)
AST: 29 U/L (ref 10–35)
Albumin: 3.9 g/dL (ref 3.6–5.1)
Alkaline phosphatase (APISO): 68 U/L (ref 37–153)
BUN/Creatinine Ratio: 17 (calc) (ref 6–22)
BUN: 18 mg/dL (ref 7–25)
CO2: 26 mmol/L (ref 20–32)
Calcium: 8.9 mg/dL (ref 8.6–10.4)
Chloride: 103 mmol/L (ref 98–110)
Creat: 1.05 mg/dL — ABNORMAL HIGH (ref 0.50–0.99)
GFR, Est African American: 66 mL/min/{1.73_m2} (ref >=60)
GFR, Est Non African American: 57 mL/min/{1.73_m2} — ABNORMAL LOW (ref >=60)
Globulin: 2.7 g/dL (calc) (ref 1.9–3.7)
Glucose, Bld: 167 mg/dL — ABNORMAL HIGH (ref 65–99)
Potassium: 4.8 mmol/L (ref 3.5–5.3)
Sodium: 141 mmol/L (ref 135–146)
Total Bilirubin: 0.6 mg/dL (ref 0.2–1.2)
Total Protein: 6.6 g/dL (ref 6.1–8.1)

## 2019-06-22 LAB — SAR COV2 SEROLOGY (COVID19)AB(IGG),IA: SARS CoV2 AB IGG: NEGATIVE

## 2019-06-22 LAB — HEMOGLOBIN A1C
Hgb A1c MFr Bld: 7.4 % of total Hgb — ABNORMAL HIGH (ref <5.7)
Mean Plasma Glucose: 166 (calc)
eAG (mmol/L): 9.2 (calc)

## 2019-06-22 LAB — LIPID PANEL
Cholesterol: 163 mg/dL (ref <200)
HDL: 27 mg/dL — ABNORMAL LOW (ref >=50)
LDL Cholesterol (Calc): 107 mg/dL (calc) — ABNORMAL HIGH
Non-HDL Cholesterol (Calc): 136 mg/dL (calc) — ABNORMAL HIGH (ref <130)
Total CHOL/HDL Ratio: 6 (calc) — ABNORMAL HIGH (ref <5.0)
Triglycerides: 170 mg/dL — ABNORMAL HIGH (ref <150)

## 2019-06-22 LAB — MICROALBUMIN, URINE: Microalb, Ur: 0.6 mg/dL

## 2019-06-22 MED ORDER — PIOGLITAZONE HCL 30 MG PO TABS: 30.0000 mg | ORAL_TABLET | Freq: Every day | ORAL | 0 refills | Status: DC

## 2019-06-30 ENCOUNTER — Telehealth: Payer: Self-pay | Admitting: Family Medicine

## 2019-06-30 NOTE — Telephone Encounter (Signed)
Pt states that she has a uti. She is having burning and pain with urination and some pink on tissue when wiping. She would like something called in if possible or if she needs to come by and leave specimen?

## 2019-07-01 ENCOUNTER — Other Ambulatory Visit: Payer: Self-pay | Admitting: Family Medicine

## 2019-07-01 MED ORDER — SULFAMETHOXAZOLE-TRIMETHOPRIM 800-160 MG PO TABS: 1.0000 | ORAL_TABLET | Freq: Two times a day (BID) | ORAL | 0 refills | Status: DC

## 2019-07-01 NOTE — Telephone Encounter (Signed)
I sent in Bactrim

## 2019-07-01 NOTE — Telephone Encounter (Signed)
Pt aware via vm 

## 2019-07-17 NOTE — Progress Notes (Signed)
Cardiology Office Note  Date:  07/19/2019   ID:  Linda, Briggs 27-Aug-1956, MRN [id number]  PCP:  Susy Frizzle, MD   Chief Complaint  Patient presents with  . other    12 month f/u no complaints today. Meds reviewed verbally with pt.    HPI:  [name] is a very pleasant 63 year old woman with  diabetes,  on insulin.  hemoglobin A1c  5.9  Episodes of sinus tachycardia morbid obesity,  hyperlipidemia,  hypertension,  chronic lower extremity swelling,   dizziness.   Neuropathy in her feet Mom with dementia, lives with her She presents for routine followup of her hypertension,  leg swelling, sinus tachycardia  Doing well, uses a cane Heart rate well controlled on metoprolol Rare chest pain, atypical metoprolol 25 BID, sometimes takes 50  Legs are weak, walks with a cane. No regular exercise program  HBA1C 7.2 Started on actos UTIs recently, completed antibiotics  Has not taken lasix past few days secondary to UTI symptoms  Labs reviewed Total chol 141  carotid ultrasound showing minimal disease.   EKG personally reviewed by myself on todays visit Shows normal sinus rhythm rate 66 bpm no significant ST-T wave changes  Other past medical history reviewed Holter monitor in the past that showed elevated baseline heart rate with frequent periods of sinus tachycardia. Improvement in dizzy episodes and fluttering with metoprolol.   PMH:   has a past medical history of Arthritis, Bulging disc, Diabetes mellitus, Gastroparesis, History of hepatitis B, Hyperlipidemia, Hypertension, Neuromuscular disorder ([place]), Neuropathy, Nonproliferative diabetic retinopathy ([place]), Periodic heart flutter, Peripheral vascular disease ([place]), Shortness of breath dyspnea, and Sleep apnea.  PSH:    Past Surgical History:  Procedure Laterality Date  . BIOPSY  04/28/2018   Procedure: BIOPSY;  Surgeon: Ladene Artist, MD;  Location: [facility];  Service: Endoscopy;;  . BIOPSY  THYROID     january 2016  . ESOPHAGOGASTRODUODENOSCOPY (EGD) WITH PROPOFOL N/A 04/28/2018   Procedure: ESOPHAGOGASTRODUODENOSCOPY (EGD) WITH PROPOFOL;  Surgeon: Ladene Artist, MD;  Location: [facility];  Service: Endoscopy;  Laterality: N/A;  . HYSTEROSCOPY W/D&C N/A 02/15/2015   Procedure: DILATATION AND CURETTAGE /HYSTEROSCOPY;  Surgeon: Linda Hedges, DO;  Location: [facility];  Service: Gynecology;  Laterality: N/A;  . TONSILLECTOMY      Outpatient Encounter Medications as of 07/19/2019  Medication Sig  . Alcohol Swabs (B-D SINGLE USE SWABS REGULAR) PADS Use to monitor FSBS 3x daily for fluctuating FSBS. DX: 250.00  . aspirin EC 81 MG tablet Take 81 mg by mouth daily.  . Blood Glucose Calibration (TRUE METRIX LEVEL 2) Normal SOLN Use as directed to monitor FSBS 3x daily. Dx: E11.65  . Blood Glucose Monitoring Suppl (TRUE METRIX AIR GLUCOSE METER) DEVI 1 each by Does not apply route daily. Use as directed to monitor FSBS 3x daily. Dx: E11.65  . Dulaglutide (TRULICITY) 1.5 KX/3.8HW SOPN Inject 1.5 mg into the skin once a week.  . furosemide (LASIX) 20 MG tablet TAKE 1 TABLET EVERY DAY  . gabapentin (NEURONTIN) 300 MG capsule TAKE 1 CAPSULE THREE TIMES DAILY  . glipiZIDE (GLUCOTROL XL) 5 MG 24 hr tablet Take 1 tablet (5 mg total) by mouth daily with breakfast.  . glucose blood (TRUE METRIX BLOOD GLUCOSE TEST) test strip Use as directed to monitor FSBS 3x daily. Dx: E11.65  . lisinopril (ZESTRIL) 5 MG tablet TAKE 1 TABLET EVERY DAY  . metFORMIN (GLUCOPHAGE) 1000 MG tablet TAKE 1 TABLET TWICE DAILY WITH A MEAL  .  metoprolol tartrate (LOPRESSOR) 50 MG tablet TAKE 1 TABLET TWICE DAILY. PATIENT NEEDS TO CONTACT OFFICE TO SCHEDULE FUTURE APPOINTMENT AND REFILLS.  . Multiple Vitamin (MULTIVITAMIN) tablet Take 1 tablet by mouth daily.  . ondansetron (ZOFRAN) 4 MG tablet Take 4 mg by mouth every 8 (eight) hours as needed for nausea or vomiting.  . pantoprazole (PROTONIX) 40 MG tablet TAKE 1 TABLET  TWICE DAILY  . pioglitazone (ACTOS) 30 MG tablet Take 1 tablet (30 mg total) by mouth daily.  . potassium chloride (K-DUR) 10 MEQ tablet Take 1 tablet (10 mEq total) by mouth daily.  . pravastatin (PRAVACHOL) 40 MG tablet TAKE 1 TABLET EVERY DAY  . traMADol (ULTRAM) 50 MG tablet TAKE 1 TABLET BY MOUTH EVERY 6 HOURS AS NEEDED FOR  CHRONIC  BACK  PAIN  . TRUEPLUS LANCETS 30G MISC Use as directed to monitor FSBS 3x daily. Dx: E11.65  . VITAMIN C-VITAMIN D-ZINC PO Take by mouth daily.  . [DISCONTINUED] Dulaglutide 0.75 MG/0.5ML SOPN INJECT  0.75MG   INTO  THE  SKIN  ONCE  A  WEEK (Patient not taking: Reported on 07/19/2019)  . [DISCONTINUED] potassium chloride (K-DUR,KLOR-CON) 10 MEQ tablet TAKE 1 TABLET EVERY DAY (Patient not taking: Reported on 07/19/2019)  . [DISCONTINUED] sulfamethoxazole-trimethoprim (BACTRIM DS) 800-160 MG tablet Take 1 tablet by mouth 2 (two) times daily. (Patient not taking: Reported on 07/19/2019)   No facility-administered encounter medications on file as of 07/19/2019.    Allergies:   Codeine, Penicillins, Ciprofloxacin, Food, Januvia [sitagliptin], and Latex   Social History:  The patient  reports that she has never smoked. She has never used smokeless tobacco. She reports that she does not drink alcohol or use drugs.   Family History:   family history includes Heart disease in her brother and father; Hyperlipidemia in her father and mother; Hypertension in her father; Kidney cancer in her sister; Kidney disease in her maternal aunt, maternal uncle, and sister; Ovarian cancer in her maternal aunt; Stroke in her brother.    Review of Systems: Review of Systems  Constitutional: Negative.   HENT: Negative.   Respiratory: Negative.   Cardiovascular: Positive for leg swelling.  Gastrointestinal: Negative.   Musculoskeletal: Negative.        Leg weakness, balance instability  Neurological: Negative.   Psychiatric/Behavioral: Negative.   All other systems reviewed and  are negative.   PHYSICAL EXAM: VS:  BP 110/68 (BP Location: Left Arm, Patient Position: Sitting, Cuff Size: Large)   Pulse 66   Ht 5\' 6"  (1.676 m)   Wt 286 lb 4 oz (129.8 kg)   SpO2 99%   BMI 46.20 kg/m  , BMI Body mass index is 46.2 kg/m. Constitutional:  oriented to person, place, and time. No distress.  HENT:  Head: Grossly normal Eyes:  no discharge. No scleral icterus.  Neck: No JVD, no carotid bruits  Cardiovascular: Regular rate and rhythm, no murmurs appreciated [place]y edema Pulmonary/Chest: Clear to auscultation bilaterally, no wheezes or rails Abdominal: Soft.  no distension.  no tenderness.  Musculoskeletal: Normal range of motion Neurological:  normal muscle tone. Coordination normal. No atrophy Skin: Skin warm and dry Psychiatric: normal affect, pleasant   Recent Labs: 06/21/2019: ALT 22; BUN 18; Creat 1.05; Hemoglobin 13.0; Platelets 148; Potassium 4.8; Sodium 141    Lipid Panel Lab Results  Component Value Date   CHOL 163 06/21/2019   HDL 27 (L) 06/21/2019   LDLCALC 107 (H) 06/21/2019   TRIG 170 (H) 06/21/2019  Wt Readings from Last 3 Encounters:  07/19/19 286 lb 4 oz (129.8 kg)  06/21/19 (!) 309 lb (140.2 kg)  10/22/18 (!) 308 lb (139.7 kg)      ASSESSMENT AND PLAN:  Shortness of breath - Plan: EKG 12-Lead Obesity, deconditioning Weight trending down  Type 2 diabetes mellitus with complication, with long-term current use of insulin (HCC) - Plan: EKG 12-Lead HBA1C higher, Weight may be coming down, Diet discussed  Paroxysmal tachycardia Well controlled  Essential hypertension - Plan: EKG 12-Lead Stay on metoprolol, rate and rhythm well controlled Blood pressure numbers well controlled  Pure hypercholesterolemia - Plan: EKG 12-Lead Stay on pravastain  Localized edema - Plan: EKG 12-Lead Chronic venous stasis Needs ace wraps, compression pumps  Morbid obesity (HCC) Unable to exercise Weight trending down Work on  diet Disposition:   F/U  12 months   Total encounter time more than 25 minutes  Greater than 50% was spent in counseling and coordination of care with the patient    No orders of the defined types were placed in this encounter.    Signed, Dossie Arbour, M.D., Ph.D. 07/19/2019  [facility] [place], [place] [phone]

## 2019-07-19 ENCOUNTER — Encounter: Payer: Self-pay | Admitting: Cardiovascular Disease

## 2019-07-19 ENCOUNTER — Other Ambulatory Visit: Payer: Self-pay

## 2019-07-19 ENCOUNTER — Ambulatory Visit (INDEPENDENT_AMBULATORY_CARE_PROVIDER_SITE_OTHER): Payer: Medicare HMO | Admitting: Cardiovascular Disease

## 2019-07-19 VITALS — BP 110/68 | HR 66 | Ht 66.0 in | Wt 286.2 lb

## 2019-07-19 DIAGNOSIS — R002 Palpitations: Secondary | ICD-10-CM

## 2019-07-19 DIAGNOSIS — I479 Paroxysmal tachycardia, unspecified: Secondary | ICD-10-CM

## 2019-07-19 DIAGNOSIS — Z794 Long term (current) use of insulin: Secondary | ICD-10-CM | POA: Diagnosis not present

## 2019-07-19 DIAGNOSIS — E7800 Pure hypercholesterolemia, unspecified: Secondary | ICD-10-CM | POA: Diagnosis not present

## 2019-07-19 DIAGNOSIS — E1169 Type 2 diabetes mellitus with other specified complication: Secondary | ICD-10-CM | POA: Diagnosis not present

## 2019-07-19 DIAGNOSIS — I10 Essential (primary) hypertension: Secondary | ICD-10-CM | POA: Diagnosis not present

## 2019-07-19 NOTE — Patient Instructions (Signed)

## 2019-08-09 ENCOUNTER — Other Ambulatory Visit: Payer: Self-pay

## 2019-08-09 ENCOUNTER — Ambulatory Visit
Admission: RE | Admit: 2019-08-09 | Discharge: 2019-08-09 | Disposition: A | Discharge status: Home / Self Care | Payer: Medicare HMO | Source: Ambulatory Visit | Attending: Family Medicine | Admitting: Family Medicine

## 2019-08-09 DIAGNOSIS — R918 Other nonspecific abnormal finding of lung field: Secondary | ICD-10-CM | POA: Diagnosis not present

## 2019-08-09 DIAGNOSIS — R911 Solitary pulmonary nodule: Secondary | ICD-10-CM

## 2019-08-12 ENCOUNTER — Other Ambulatory Visit: Payer: Self-pay | Admitting: Family Medicine

## 2019-08-12 ENCOUNTER — Other Ambulatory Visit: Payer: Self-pay | Admitting: Cardiovascular Disease

## 2019-08-30 ENCOUNTER — Other Ambulatory Visit: Payer: Self-pay | Admitting: Family Medicine

## 2019-09-20 ENCOUNTER — Other Ambulatory Visit: Payer: Self-pay

## 2019-09-20 ENCOUNTER — Ambulatory Visit (INDEPENDENT_AMBULATORY_CARE_PROVIDER_SITE_OTHER): Payer: Medicare HMO | Admitting: Family Medicine

## 2019-09-20 DIAGNOSIS — B349 Viral infection, unspecified: Secondary | ICD-10-CM | POA: Diagnosis not present

## 2019-09-20 NOTE — Progress Notes (Signed)
Subjective:    Patient ID: Linda Briggs, female    DOB: August 24, 1956, 64 y.o.   MRN: [id number]  HPI  Patient is being seen today as a telephone visit.  She consents to be seen via telephone.  Phone call began at 1147.  Phone call concluded at 1158.  She states that her symptoms began last Wednesday.  Symptoms included severe fatigue.  She also reports diffuse body aches.  She reports chills and a dull headache.  She has been running a low-grade temperature for the last 4 days.  The highest her temperature has been is 99.4 however this is higher than her baseline.  She continues to have body aches and chills.  Her last couple days she has developed some nausea and diarrhea.  She has not vomited.  She does have an occasional cough and head congestion with rhinorrhea and sneezing.  Symptoms are consistent with a viral syndrome.  Today will be day 6.  She denies any chest pain or shortness of breath or dyspnea on exertion. Past Medical History:  Diagnosis Date  . Arthritis    feet & toes  . Bulging disc   . Diabetes mellitus   . Gastroparesis   . History of hepatitis B   . Hyperlipidemia   . Hypertension   . Neuromuscular disorder ([place])    in feet and fingers   . Neuropathy    feet  . Nonproliferative diabetic retinopathy ([place])   . 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 Peripheral vascular disease ([place])   . Shortness of breath dyspnea    on lopressor   . Sleep apnea    pt had study done but it was incomplete due to blood sugar issues    Past Surgical History:  Procedure Laterality Date  . BIOPSY  04/28/2018   Procedure: BIOPSY;  Surgeon: Ladene Artist, MD;  Location: [facility];  Service: Endoscopy;;  . BIOPSY THYROID     january 2016  . ESOPHAGOGASTRODUODENOSCOPY (EGD) WITH PROPOFOL N/A 04/28/2018   Procedure: ESOPHAGOGASTRODUODENOSCOPY (EGD) WITH PROPOFOL;  Surgeon: Ladene Artist, MD;  Location: [facility];  Service: Endoscopy;  Laterality: N/A;  . HYSTEROSCOPY  WITH D & C N/A 02/15/2015   Procedure: DILATATION AND CURETTAGE /HYSTEROSCOPY;  Surgeon: Linda Hedges, DO;  Location: [facility];  Service: Gynecology;  Laterality: N/A;  . TONSILLECTOMY     Current Outpatient Medications on File Prior to Visit  Medication Sig Dispense Refill  . Alcohol Swabs (B-D SINGLE USE SWABS REGULAR) PADS Use to monitor FSBS 3x daily for fluctuating FSBS. DX: 250.00 300 each 1  . aspirin EC 81 MG tablet Take 81 mg by mouth daily.    . Blood Glucose Calibration (TRUE METRIX LEVEL 2) Normal SOLN Use as directed to monitor FSBS 3x daily. Dx: E11.65 1 each 1  . Blood Glucose Monitoring Suppl (TRUE METRIX AIR GLUCOSE METER) DEVI 1 each by Does not apply route daily. Use as directed to monitor FSBS 3x daily. Dx: E11.65 1 Device 1  . Dulaglutide (TRULICITY) 1.5 ZJ/6.7HA SOPN Inject 1.5 mg into the skin once a week. 12 pen 3  . furosemide (LASIX) 20 MG tablet TAKE 1 TABLET EVERY DAY 90 tablet 0  . gabapentin (NEURONTIN) 300 MG capsule TAKE 1 CAPSULE THREE TIMES DAILY 270 capsule 1  . glipiZIDE (GLUCOTROL XL) 5 MG 24 hr tablet TAKE 1 TABLET  DAILY WITH BREAKFAST. 90 tablet 1  . glucose blood (TRUE METRIX BLOOD GLUCOSE  TEST) test strip Use as directed to monitor FSBS 3x daily. Dx: E11.65 300 each 3  . lisinopril (ZESTRIL) 5 MG tablet TAKE 1 TABLET EVERY DAY 90 tablet 3  . metFORMIN (GLUCOPHAGE) 1000 MG tablet TAKE 1 TABLET TWICE DAILY WITH A MEAL 180 tablet 1  . metoprolol tartrate (LOPRESSOR) 50 MG tablet Take 1 tablet (50 mg total) by mouth 2 (two) times daily. 30 tablet 3  . Multiple Vitamin (MULTIVITAMIN) tablet Take 1 tablet by mouth daily.    . ondansetron (ZOFRAN) 4 MG tablet Take 4 mg by mouth every 8 (eight) hours as needed for nausea or vomiting.    . pantoprazole (PROTONIX) 40 MG tablet TAKE 1 TABLET TWICE DAILY 180 tablet 3  . pioglitazone (ACTOS) 30 MG tablet TAKE 1 TABLET EVERY DAY 90 tablet 0  . potassium chloride (K-DUR) 10 MEQ tablet Take 1 tablet (10 mEq total) by  mouth daily. 90 tablet 3  . pravastatin (PRAVACHOL) 40 MG tablet TAKE 1 TABLET EVERY DAY 90 tablet 3  . traMADol (ULTRAM) 50 MG tablet TAKE 1 TABLET BY MOUTH EVERY 6 HOURS AS NEEDED FOR  CHRONIC  BACK  PAIN 60 tablet 0  . TRUEPLUS LANCETS 30G MISC Use as directed to monitor FSBS 3x daily. Dx: E11.65 200 each 3  . VITAMIN C-VITAMIN D-ZINC PO Take by mouth daily.     No current facility-administered medications on file prior to visit.   Allergies  Allergen Reactions  . Codeine Anaphylaxis and Hives  . Penicillins Anaphylaxis, Hives and Other (See Comments)    Has patient had a PCN reaction causing immediate rash, facial/tongue/throat swelling, SOB or lightheadedness with hypotension: No Has patient had a PCN reaction causing severe rash involving mucus membranes or skin necrosis: No Has patient had a PCN reaction that required hospitalization: No Has patient had a PCN reaction occurring within the last 10 years: No If all of the above answers are "NO", then may proceed with Cephalosporin use.   . Ciprofloxacin Other (See Comments)    Tongue Swelling  . Food Nausea And Vomiting and Other (See Comments)    Olives   . Januvia [Sitagliptin] Other (See Comments)    Stomach cramps  . Latex Hives   Social History   Socioeconomic History  . Marital status: Single    Spouse name: Not on file  . Number of children: 0  . Years of education: Not on file  . Highest education level: Not on file  Occupational History  . Occupation: retired  Tobacco Use  . Smoking status: Never Smoker  . Smokeless tobacco: Never Used  Substance and Sexual Activity  . Alcohol use: No    Comment: social drinker.  . Drug use: No  . Sexual activity: Not on file  Other Topics Concern  . Not on file  Social History Narrative  . Not on file   Social Determinants of Health   Financial Resource Strain:   . Difficulty of Paying Living Expenses: Not on file  Food Insecurity:   . Worried About Brewing technologist in the Last Year: Not on file  . Ran Out of Food in the Last Year: Not on file  Transportation Needs:   . Lack of Transportation (Medical): Not on file  . Lack of Transportation (Non-Medical): Not on file  Physical Activity:   . Days of Exercise per Week: Not on file  . Minutes of Exercise per Session: Not on file  Stress:   . Feeling  of Stress : Not on file  Social Connections:   . Frequency of Communication with Friends and Family: Not on file  . Frequency of Social Gatherings with Friends and Family: Not on file  . Attends Religious Services: Not on file  . Active Member of Clubs or Organizations: Not on file  . Attends Banker Meetings: Not on file  . Marital Status: Not on file  Intimate Partner Violence:   . Fear of Current or Ex-Partner: Not on file  . Emotionally Abused: Not on file  . Physically Abused: Not on file  . Sexually Abused: Not on file     Review of Systems  All other systems reviewed and are negative.      Objective:   Physical Exam        Assessment & Plan:  Acute viral syndrome  Symptoms sound consistent with some type of viral syndrome possibly gastroenteritis.  Given the current COVID-19 pandemic however this is also a possibility.  Patient will be an extremely high risk patient given her obesity and diabetes and hypertension.  Therefore I recommended that she be tested immediately.  I gave the patient contact information and told her to text the word Covid to the [phone].  I have recommended the patient quarantine until we have the results of this test back.  If she develops any shortness of breath or chest pain she is to go the emergency room immediately.  I have recommended that she push fluids given her diarrhea.  Because of the diarrhea I have also recommended that she temporarily discontinue her furosemide.  Also due to the nausea and decreased appetite I have recommended that she temporarily discontinue glipizide.  We may need  to hold Metformin if symptoms worsen.  Await the results of her Covid test.

## 2019-09-22 ENCOUNTER — Other Ambulatory Visit: Payer: Medicare HMO

## 2019-09-22 ENCOUNTER — Other Ambulatory Visit: Payer: Self-pay | Admitting: Family Medicine

## 2019-09-22 DIAGNOSIS — I10 Essential (primary) hypertension: Secondary | ICD-10-CM

## 2019-09-22 DIAGNOSIS — E118 Type 2 diabetes mellitus with unspecified complications: Secondary | ICD-10-CM

## 2019-09-22 DIAGNOSIS — E7800 Pure hypercholesterolemia, unspecified: Secondary | ICD-10-CM

## 2019-09-23 ENCOUNTER — Ambulatory Visit: Payer: Medicare HMO | Attending: Internal Medicine

## 2019-09-23 ENCOUNTER — Other Ambulatory Visit: Payer: Self-pay | Admitting: Cardiovascular Disease

## 2019-09-23 DIAGNOSIS — Z20822 Contact with and (suspected) exposure to covid-19: Secondary | ICD-10-CM | POA: Diagnosis not present

## 2019-09-24 LAB — NOVEL CORONAVIRUS, NAA: SARS-CoV-2, NAA: NOT DETECTED

## 2019-09-29 ENCOUNTER — Other Ambulatory Visit: Payer: Self-pay | Admitting: Family Medicine

## 2019-10-16 ENCOUNTER — Other Ambulatory Visit: Payer: Self-pay | Admitting: Family Medicine

## 2019-10-25 ENCOUNTER — Other Ambulatory Visit: Payer: Self-pay | Admitting: Family Medicine

## 2019-12-15 ENCOUNTER — Other Ambulatory Visit: Payer: Medicare HMO

## 2019-12-15 ENCOUNTER — Other Ambulatory Visit: Payer: Self-pay

## 2019-12-15 ENCOUNTER — Other Ambulatory Visit: Payer: Self-pay | Admitting: Family Medicine

## 2019-12-15 DIAGNOSIS — Z794 Long term (current) use of insulin: Secondary | ICD-10-CM | POA: Diagnosis not present

## 2019-12-15 DIAGNOSIS — E7800 Pure hypercholesterolemia, unspecified: Secondary | ICD-10-CM | POA: Diagnosis not present

## 2019-12-15 DIAGNOSIS — I10 Essential (primary) hypertension: Secondary | ICD-10-CM

## 2019-12-15 DIAGNOSIS — E118 Type 2 diabetes mellitus with unspecified complications: Secondary | ICD-10-CM

## 2019-12-16 LAB — COMPREHENSIVE METABOLIC PANEL
AG Ratio: 1.5 (calc) (ref 1.0–2.5)
ALT: 25 U/L (ref 6–29)
AST: 21 U/L (ref 10–35)
Albumin: 3.9 g/dL (ref 3.6–5.1)
Alkaline phosphatase (APISO): 68 U/L (ref 37–153)
BUN: 16 mg/dL (ref 7–25)
CO2: 28 mmol/L (ref 20–32)
Calcium: 9.3 mg/dL (ref 8.6–10.4)
Chloride: 104 mmol/L (ref 98–110)
Creat: 0.99 mg/dL (ref 0.50–0.99)
Globulin: 2.6 g/dL (calc) (ref 1.9–3.7)
Glucose, Bld: 199 mg/dL — ABNORMAL HIGH (ref 65–99)
Potassium: 4.8 mmol/L (ref 3.5–5.3)
Sodium: 140 mmol/L (ref 135–146)
Total Bilirubin: 0.6 mg/dL (ref 0.2–1.2)
Total Protein: 6.5 g/dL (ref 6.1–8.1)

## 2019-12-16 LAB — CBC WITH DIFFERENTIAL/PLATELET
Absolute Monocytes: 387 cells/uL (ref 200–950)
Basophils Absolute: 72 cells/uL (ref 0–200)
Basophils Relative: 0.8 %
Eosinophils Absolute: 171 cells/uL (ref 15–500)
Eosinophils Relative: 1.9 %
HCT: 39.3 % (ref 35.0–45.0)
Hemoglobin: 13.1 g/dL (ref 11.7–15.5)
Lymphs Abs: 2493 cells/uL (ref 850–3900)
MCH: 31.8 pg (ref 27.0–33.0)
MCHC: 33.3 g/dL (ref 32.0–36.0)
MCV: 95.4 fL (ref 80.0–100.0)
MPV: 10.4 fL (ref 7.5–12.5)
Monocytes Relative: 4.3 %
Neutro Abs: 5877 cells/uL (ref 1500–7800)
Neutrophils Relative %: 65.3 %
Platelets: 126 10*3/uL — ABNORMAL LOW (ref 140–400)
RBC: 4.12 10*6/uL (ref 3.80–5.10)
RDW: 12.9 % (ref 11.0–15.0)
Total Lymphocyte: 27.7 %
WBC: 9 10*3/uL (ref 3.8–10.8)

## 2019-12-16 LAB — LIPID PANEL
Cholesterol: 156 mg/dL (ref <200)
HDL: 29 mg/dL — ABNORMAL LOW (ref >=50)
LDL Cholesterol (Calc): 104 mg/dL (calc) — ABNORMAL HIGH
Non-HDL Cholesterol (Calc): 127 mg/dL (calc) (ref <130)
Total CHOL/HDL Ratio: 5.4 (calc) — ABNORMAL HIGH (ref <5.0)
Triglycerides: 126 mg/dL (ref <150)

## 2019-12-16 LAB — HEMOGLOBIN A1C
Hgb A1c MFr Bld: 7.8 % of total Hgb — ABNORMAL HIGH (ref <5.7)
Mean Plasma Glucose: 177 (calc)
eAG (mmol/L): 9.8 (calc)

## 2019-12-17 ENCOUNTER — Other Ambulatory Visit: Payer: Self-pay | Admitting: Family Medicine

## 2019-12-17 DIAGNOSIS — Z1231 Encounter for screening mammogram for malignant neoplasm of breast: Secondary | ICD-10-CM

## 2019-12-20 ENCOUNTER — Encounter: Payer: Self-pay | Admitting: Family Medicine

## 2019-12-20 ENCOUNTER — Other Ambulatory Visit: Payer: Self-pay

## 2019-12-20 ENCOUNTER — Ambulatory Visit (INDEPENDENT_AMBULATORY_CARE_PROVIDER_SITE_OTHER): Payer: Medicare HMO | Admitting: Family Medicine

## 2019-12-20 VITALS — BP 146/90 | HR 78 | Temp 97.0°F | Resp 20 | Ht 66.0 in | Wt 321.0 lb

## 2019-12-20 DIAGNOSIS — Z794 Long term (current) use of insulin: Secondary | ICD-10-CM | POA: Diagnosis not present

## 2019-12-20 DIAGNOSIS — E1140 Type 2 diabetes mellitus with diabetic neuropathy, unspecified: Secondary | ICD-10-CM | POA: Diagnosis not present

## 2019-12-20 DIAGNOSIS — E118 Type 2 diabetes mellitus with unspecified complications: Secondary | ICD-10-CM

## 2019-12-20 DIAGNOSIS — E7800 Pure hypercholesterolemia, unspecified: Secondary | ICD-10-CM | POA: Diagnosis not present

## 2019-12-20 DIAGNOSIS — I10 Essential (primary) hypertension: Secondary | ICD-10-CM | POA: Diagnosis not present

## 2019-12-20 MED ORDER — FARXIGA 10 MG PO TABS: 10.0000 mg | ORAL_TABLET | Freq: Every day | ORAL | 3 refills | Status: DC

## 2019-12-20 NOTE — Progress Notes (Signed)
Subjective:    Patient ID: Linda Briggs, female    DOB: 10/26/1955, 64 y.o.   MRN: [id number]  Patient has a history of diabetes mellitus type 2 currently on a combination of Trulicity, Metformin, actos and glipizide.  She has a history of nonproliferative diabetic retinopathy.  She also has severe diabetic neuropathy.  She has essentially no sensation in her toes on either foot.  She also occasionally gets burning pain in her feet.  Her blood pressure today is elevated at 146/90 but she had to take her sister to the hospital this morning and is upset by that.  She denies any polyuria, polydipsia, or blurry vision.   She denies any episodes of hypoglycemia.  She denies any myalgias or right upper quadrant pain.  She is due for pap which she declines.  Due for colonoscopy and cologuard but she declines both.  She is also due for covid shot which she is uncertain about. Appointment on 12/15/2019  Component Date Value Ref Range Status  . WBC 12/15/2019 9.0  3.8 - 10.8 Thousand/uL Final  . RBC 12/15/2019 4.12  3.80 - 5.10 Million/uL Final  . Hemoglobin 12/15/2019 13.1  11.7 - 15.5 g/dL Final  . HCT 13/24/401004/14/2021 39.3  35.0 - 45.0 % Final  . MCV 12/15/2019 95.4  80.0 - 100.0 fL Final  . MCH 12/15/2019 31.8  27.0 - 33.0 pg Final  . MCHC 12/15/2019 33.3  32.0 - 36.0 g/dL Final  . RDW 27/25/366404/14/2021 12.9  11.0 - 15.0 % Final  . Platelets 12/15/2019 126* 140 - 400 Thousand/uL Final  . MPV 12/15/2019 10.4  7.5 - 12.5 fL Final  . Neutro Abs 12/15/2019 5,877  1,500 - 7,800 cells/uL Final  . Lymphs Abs 12/15/2019 2,493  850 - 3,900 cells/uL Final  . Absolute Monocytes 12/15/2019 387  200 - 950 cells/uL Final  . Eosinophils Absolute 12/15/2019 171  15 - 500 cells/uL Final  . Basophils Absolute 12/15/2019 72  0 - 200 cells/uL Final  . Neutrophils Relative % 12/15/2019 65.3  % Final  . Total Lymphocyte 12/15/2019 27.7  % Final  . Monocytes Relative 12/15/2019 4.3  % Final  . Eosinophils Relative 12/15/2019  1.9  % Final  . Basophils Relative 12/15/2019 0.8  % Final  . Glucose, Bld 12/15/2019 199* 65 - 99 mg/dL Final   Comment: .            Fasting reference interval . For someone without known diabetes, a glucose value >125 mg/dL indicates that they may have diabetes and this should be confirmed with a follow-up test. .   . BUN 12/15/2019 16  7 - 25 mg/dL Final  . Creat 40/34/742504/14/2021 0.99  0.50 - 0.99 mg/dL Final   Comment: For patients >63 years of age, the reference limit for Creatinine is approximately 13% higher for people identified as African-American. .   Edwena Felty. BUN/Creatinine Ratio 12/15/2019 NOT APPLICABLE  6 - 22 (calc) Final  . Sodium 12/15/2019 140  135 - 146 mmol/L Final  . Potassium 12/15/2019 4.8  3.5 - 5.3 mmol/L Final  . Chloride 12/15/2019 104  98 - 110 mmol/L Final  . CO2 12/15/2019 28  20 - 32 mmol/L Final  . Calcium 12/15/2019 9.3  8.6 - 10.4 mg/dL Final  . Total Protein 12/15/2019 6.5  6.1 - 8.1 g/dL Final  . Albumin 95/63/875604/14/2021 3.9  3.6 - 5.1 g/dL Final  . Globulin 43/32/951804/14/2021 2.6  1.9 - 3.7 g/dL (calc) Final  .  AG Ratio 12/15/2019 1.5  1.0 - 2.5 (calc) Final  . Total Bilirubin 12/15/2019 0.6  0.2 - 1.2 mg/dL Final  . Alkaline phosphatase (APISO) 12/15/2019 68  37 - 153 U/L Final  . AST 12/15/2019 21  10 - 35 U/L Final  . ALT 12/15/2019 25  6 - 29 U/L Final  . Cholesterol 12/15/2019 156  <200 mg/dL Final  . HDL 12/15/2019 29* > OR = 50 mg/dL Final  . Triglycerides 12/15/2019 126  <150 mg/dL Final  . LDL Cholesterol (Calc) 12/15/2019 104* mg/dL (calc) Final   Comment: Reference range: <100 . Desirable range <100 mg/dL for primary prevention;   <70 mg/dL for patients with CHD or diabetic patients  with > or = 2 CHD risk factors. Marland Kitchen LDL-C is now calculated using the Martin-Hopkins  calculation, which is a validated novel method providing  better accuracy than the Friedewald equation in the  estimation of LDL-C.  Cresenciano Genre et al. Annamaria Helling. 8182;993(71): 2061-2068    (http://education.[facility].com/faq/FAQ164)   . Total CHOL/HDL Ratio 12/15/2019 5.4* <5.0 (calc) Final  . Non-HDL Cholesterol (Calc) 12/15/2019 127  <130 mg/dL (calc) Final   Comment: For patients with diabetes plus 1 major ASCVD risk  factor, treating to a non-HDL-C goal of <100 mg/dL  (LDL-C of <70 mg/dL) is considered a therapeutic  option.   . Hgb A1c MFr Bld 12/15/2019 7.8* <5.7 % of total Hgb Final   Comment: For someone without known diabetes, a hemoglobin A1c value of 6.5% or greater indicates that they may have  diabetes and this should be confirmed with a follow-up  test. . For someone with known diabetes, a value <7% indicates  that their diabetes is well controlled and a value  greater than or equal to 7% indicates suboptimal  control. A1c targets should be individualized based on  duration of diabetes, age, comorbid conditions, and  other considerations. . Currently, no consensus exists regarding use of hemoglobin A1c for diagnosis of diabetes for children. .   . Mean Plasma Glucose 12/15/2019 177  (calc) Final  . eAG (mmol/L) 12/15/2019 9.8  (calc) Final    Past Medical History:  Diagnosis Date  . Arthritis    feet & toes  . Bulging disc   . Diabetes mellitus   . Gastroparesis   . History of hepatitis B   . Hyperlipidemia   . Hypertension   . Neuromuscular disorder ([place])    in feet and fingers   . Neuropathy    feet  . Nonproliferative diabetic retinopathy ([place])   . 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 Peripheral vascular disease ([place])   . Shortness of breath dyspnea    on lopressor   . Sleep apnea    pt had study done but it was incomplete due to blood sugar issues    Past Surgical History:  Procedure Laterality Date  . BIOPSY  04/28/2018   Procedure: BIOPSY;  Surgeon: Ladene Artist, MD;  Location: [facility];  Service: Endoscopy;;  . BIOPSY THYROID     january 2016  . ESOPHAGOGASTRODUODENOSCOPY (EGD) WITH PROPOFOL N/A  04/28/2018   Procedure: ESOPHAGOGASTRODUODENOSCOPY (EGD) WITH PROPOFOL;  Surgeon: Ladene Artist, MD;  Location: [facility];  Service: Endoscopy;  Laterality: N/A;  . HYSTEROSCOPY WITH D & C N/A 02/15/2015   Procedure: DILATATION AND CURETTAGE /HYSTEROSCOPY;  Surgeon: Linda Hedges, DO;  Location: [facility];  Service: Gynecology;  Laterality: N/A;  . TONSILLECTOMY     Current Outpatient  Medications on File Prior to Visit  Medication Sig Dispense Refill  . Alcohol Swabs (B-D SINGLE USE SWABS REGULAR) PADS Use to monitor FSBS 3x daily for fluctuating FSBS. DX: 250.00 300 each 1  . aspirin EC 81 MG tablet Take 81 mg by mouth daily.    . Blood Glucose Calibration (TRUE METRIX LEVEL 2) Normal SOLN Use as directed to monitor FSBS 3x daily. Dx: E11.65 1 each 1  . Blood Glucose Monitoring Suppl (TRUE METRIX AIR GLUCOSE METER) DEVI 1 each by Does not apply route daily. Use as directed to monitor FSBS 3x daily. Dx: E11.65 1 Device 1  . furosemide (LASIX) 20 MG tablet TAKE 1 TABLET EVERY DAY 90 tablet 0  . gabapentin (NEURONTIN) 300 MG capsule TAKE 1 CAPSULE THREE TIMES DAILY 270 capsule 1  . glipiZIDE (GLUCOTROL XL) 5 MG 24 hr tablet TAKE 1 TABLET  DAILY WITH BREAKFAST. 90 tablet 1  . glucose blood (TRUE METRIX BLOOD GLUCOSE TEST) test strip Use as directed to monitor FSBS 3x daily. Dx: E11.65 300 each 3  . lisinopril (ZESTRIL) 5 MG tablet TAKE 1 TABLET EVERY DAY 90 tablet 3  . metFORMIN (GLUCOPHAGE) 1000 MG tablet TAKE 1 TABLET TWICE DAILY WITH A MEAL 180 tablet 1  . metoprolol tartrate (LOPRESSOR) 50 MG tablet TAKE 1 TABLET TWICE DAILY 180 tablet 2  . Multiple Vitamin (MULTIVITAMIN) tablet Take 1 tablet by mouth daily.    . ondansetron (ZOFRAN) 4 MG tablet Take 4 mg by mouth every 8 (eight) hours as needed for nausea or vomiting.    . pantoprazole (PROTONIX) 40 MG tablet TAKE 1 TABLET TWICE DAILY 180 tablet 3  . pioglitazone (ACTOS) 30 MG tablet TAKE 1 TABLET EVERY DAY 90 tablet 0  . potassium chloride  (K-DUR) 10 MEQ tablet Take 1 tablet (10 mEq total) by mouth daily. 90 tablet 3  . potassium chloride (KLOR-CON) 10 MEQ tablet TAKE 1 TABLET EVERY DAY 90 tablet 1  . pravastatin (PRAVACHOL) 40 MG tablet TAKE 1 TABLET EVERY DAY 90 tablet 3  . traMADol (ULTRAM) 50 MG tablet TAKE 1 TABLET BY MOUTH EVERY 6 HOURS AS NEEDED FOR  CHRONIC  BACK  PAIN 60 tablet 0  . TRUEPLUS LANCETS 30G MISC Use as directed to monitor FSBS 3x daily. Dx: E11.65 200 each 3  . TRULICITY 1.5 MG/0.5ML SOPN INJECT 1.5MG  INTO THE SKIN EVERY WEEK AS DIRECTED 12 pen 0  . VITAMIN C-VITAMIN D-ZINC PO Take by mouth daily.     No current facility-administered medications on file prior to visit.   Allergies  Allergen Reactions  . Codeine Anaphylaxis and Hives  . Penicillins Anaphylaxis, Hives and Other (See Comments)    Has patient had a PCN reaction causing immediate rash, facial/tongue/throat swelling, SOB or lightheadedness with hypotension: No Has patient had a PCN reaction causing severe rash involving mucus membranes or skin necrosis: No Has patient had a PCN reaction that required hospitalization: No Has patient had a PCN reaction occurring within the last 10 years: No If all of the above answers are "NO", then may proceed with Cephalosporin use.   . Ciprofloxacin Other (See Comments)    Tongue Swelling  . Food Nausea And Vomiting and Other (See Comments)    Olives   . Januvia [Sitagliptin] Other (See Comments)    Stomach cramps  . Latex Hives   Social History   Socioeconomic History  . Marital status: Single    Spouse name: Not on file  . Number of  children: 0  . Years of education: Not on file  . Highest education level: Not on file  Occupational History  . Occupation: retired  Tobacco Use  . Smoking status: Never Smoker  . Smokeless tobacco: Never Used  Substance and Sexual Activity  . Alcohol use: No    Comment: social drinker.  . Drug use: No  . Sexual activity: Not on file  Other Topics Concern  .  Not on file  Social History Narrative  . Not on file   Social Determinants of Health   Financial Resource Strain:   . Difficulty of Paying Living Expenses:   Food Insecurity:   . Worried About Programme researcher, broadcasting/film/video in the Last Year:   . Barista in the Last Year:   Transportation Needs:   . Freight forwarder (Medical):   Marland Kitchen Lack of Transportation (Non-Medical):   Physical Activity:   . Days of Exercise per Week:   . Minutes of Exercise per Session:   Stress:   . Feeling of Stress :   Social Connections:   . Frequency of Communication with Friends and Family:   . Frequency of Social Gatherings with Friends and Family:   . Attends Religious Services:   . Active Member of Clubs or Organizations:   . Attends Banker Meetings:   Marland Kitchen Marital Status:   Intimate Partner Violence:   . Fear of Current or Ex-Partner:   . Emotionally Abused:   Marland Kitchen Physically Abused:   . Sexually Abused:    Family History  Problem Relation Age of Onset  . Hyperlipidemia Mother   . Heart disease Father   . Hyperlipidemia Father   . Hypertension Father   . Heart disease Brother   . Stroke Brother   . Kidney disease Sister   . Kidney cancer Sister   . Ovarian cancer Maternal Aunt   . Kidney disease Maternal Aunt        x 2  . Kidney disease Maternal Uncle        x 2  . Colon cancer Neg Hx   . Stomach cancer Neg Hx       Review of Systems  All other systems reviewed and are negative.      Objective:   Physical Exam  Constitutional: She is oriented to person, place, and time. She appears well-developed and well-nourished. No distress.  HENT:  Head: Normocephalic and atraumatic.  Right Ear: External ear normal.  Left Ear: External ear normal.  Nose: Nose normal.  Mouth/Throat: Oropharynx is clear and moist. No oropharyngeal exudate.  Eyes: Pupils are equal, round, and reactive to light. Conjunctivae and EOM are normal. Right eye exhibits no discharge. Left eye  exhibits no discharge. No scleral icterus.  Neck: No JVD present. No tracheal deviation present. No thyromegaly present.  Cardiovascular: Normal rate, regular rhythm, normal heart sounds and intact distal pulses. Exam reveals no gallop and no friction rub.  No murmur heard. Pulmonary/Chest: Effort normal and breath sounds normal. No stridor. No respiratory distress. She has no wheezes. She has no rales. She exhibits no tenderness.  Abdominal: Soft. Bowel sounds are normal. She exhibits no distension and no mass. There is no abdominal tenderness. There is no rebound and no guarding.  Musculoskeletal:        General: No tenderness or edema. Normal range of motion.     Cervical back: Normal range of motion and neck supple.  Lymphadenopathy:    She has no  cervical adenopathy.  Neurological: She is alert and oriented to person, place, and time. She has normal reflexes. No cranial nerve deficit. She exhibits normal muscle tone. Coordination normal.  Skin: Skin is warm. No rash noted. She is not diaphoretic. No erythema. No pallor.  Psychiatric: She has a normal mood and affect. Her behavior is normal. Judgment and thought content normal.  Vitals reviewed.         Assessment & Plan:  Type 2 diabetes mellitus with complication, with long-term current use of insulin (HCC)  Pure hypercholesterolemia  Essential hypertension  Morbid obesity (HCC)  After long discussion, we decided to ad farxiga 10 mg a day.  Will recheck labs in 3 months.  BP is elevated due to situational stress.  She will montior at home and notify me if greater than 140/90.  Discussed and recommended COVID 19 vaccine.  Cholesterol is acceptable.  Declines pap and colonoscopy/cologuard.

## 2019-12-22 ENCOUNTER — Other Ambulatory Visit: Payer: Self-pay | Admitting: Family Medicine

## 2019-12-22 DIAGNOSIS — E113393 Type 2 diabetes mellitus with moderate nonproliferative diabetic retinopathy without macular edema, bilateral: Secondary | ICD-10-CM | POA: Diagnosis not present

## 2019-12-22 DIAGNOSIS — H25013 Cortical age-related cataract, bilateral: Secondary | ICD-10-CM | POA: Diagnosis not present

## 2019-12-22 DIAGNOSIS — H40013 Open angle with borderline findings, low risk, bilateral: Secondary | ICD-10-CM | POA: Diagnosis not present

## 2019-12-22 DIAGNOSIS — H524 Presbyopia: Secondary | ICD-10-CM | POA: Diagnosis not present

## 2019-12-22 DIAGNOSIS — H2513 Age-related nuclear cataract, bilateral: Secondary | ICD-10-CM | POA: Diagnosis not present

## 2019-12-31 ENCOUNTER — Other Ambulatory Visit: Payer: Self-pay | Admitting: Family Medicine

## 2020-01-06 ENCOUNTER — Other Ambulatory Visit: Payer: Self-pay | Admitting: Family Medicine

## 2020-01-13 ENCOUNTER — Other Ambulatory Visit: Payer: Self-pay | Admitting: Family Medicine

## 2020-01-13 ENCOUNTER — Telehealth: Payer: Self-pay | Admitting: Family Medicine

## 2020-01-13 NOTE — Chronic Care Management (AMB) (Signed)
  Chronic Care Management   Note  01/13/2020 Name: Linda Briggs MRN: [id number] DOB: March 11, 1956  Linda Briggs is a 64 y.o. year old female who is a primary care patient of Tanya Nones, Priscille Heidelberg, MD. I reached out to Lazarus Salines by phone today in response to a referral sent by Ms. Clayton Bibles Aymond's PCP, Donita Brooks, MD.   Linda Briggs was given information about Chronic Care Management services today including:  1. CCM service includes personalized support from designated clinical staff supervised by her physician, including individualized plan of care and coordination with other care providers 2. 24/7 contact phone numbers for assistance for urgent and routine care needs. 3. Service will only be billed when office clinical staff spend 20 minutes or more in a month to coordinate care. 4. Only one practitioner may furnish and bill the service in a calendar month. 5. The patient may stop CCM services at any time (effective at the end of the month) by phone call to the office staff.   Patient agreed to services and verbal consent obtained.   Follow up plan:   Myra Cody  [employer] Scheduler

## 2020-02-04 ENCOUNTER — Other Ambulatory Visit: Payer: Self-pay | Admitting: Family Medicine

## 2020-02-08 ENCOUNTER — Other Ambulatory Visit: Payer: Self-pay

## 2020-02-08 MED ORDER — TRUE METRIX BLOOD GLUCOSE TEST VI STRP: ORAL_STRIP | 3 refills | Status: DC

## 2020-02-08 MED ORDER — TRUE METRIX AIR GLUCOSE METER DEVI: 1.0000 | Freq: Every day | 5 refills | Status: DC

## 2020-02-14 ENCOUNTER — Telehealth: Payer: Self-pay | Admitting: Family Medicine

## 2020-02-14 ENCOUNTER — Other Ambulatory Visit: Payer: Self-pay | Admitting: Family Medicine

## 2020-02-14 MED ORDER — SULFAMETHOXAZOLE-TRIMETHOPRIM 800-160 MG PO TABS: 1.0000 | ORAL_TABLET | Freq: Two times a day (BID) | ORAL | 0 refills | Status: DC

## 2020-02-14 NOTE — Telephone Encounter (Signed)
Patient called in with c/o of possible UTI, states that she is having frequent urination and lower abdominal pain. Patient would like to know if you are willing to call in something or if you would like for her to schedule office visit. Please advise?

## 2020-02-14 NOTE — Telephone Encounter (Signed)
I will call out something.

## 2020-02-14 NOTE — Telephone Encounter (Signed)
Left message return call

## 2020-02-15 NOTE — Telephone Encounter (Signed)
Spoke with patient and informed her that refill was sent into the pharmacy. Patient verbalized understanding.

## 2020-02-17 ENCOUNTER — Other Ambulatory Visit: Payer: Self-pay | Admitting: Family Medicine

## 2020-02-24 ENCOUNTER — Other Ambulatory Visit: Payer: Self-pay | Admitting: *Deleted

## 2020-02-24 DIAGNOSIS — I10 Essential (primary) hypertension: Secondary | ICD-10-CM

## 2020-02-24 DIAGNOSIS — E7800 Pure hypercholesterolemia, unspecified: Secondary | ICD-10-CM

## 2020-02-24 DIAGNOSIS — I479 Paroxysmal tachycardia, unspecified: Secondary | ICD-10-CM

## 2020-02-24 DIAGNOSIS — R600 Localized edema: Secondary | ICD-10-CM

## 2020-02-24 DIAGNOSIS — E118 Type 2 diabetes mellitus with unspecified complications: Secondary | ICD-10-CM

## 2020-02-25 NOTE — Chronic Care Management (AMB) (Addendum)
Chronic Care Management Pharmacy  Name: RAELENE TREW  MRN: [id number] DOB: [birth year]  Chief Complaint/ HPI  Lorie Apley,  64 y.o. , female presents for their Initial CCM visit with the clinical pharmacist In office.  PCP : Susy Frizzle, MD  Their chronic conditions include: hypertension, Type II DM with retinopathy, hyperlipidemia.  Office Visits: 12/20/2019 ([facility]) - severe retinopathy, severe neuropathy, added Farxiga 65m daily, recheck A1c in 3 months  09/20/2019 ([facility]) - virus, symptoms x 6 days, recommended to hold furosemide, glipizide until appetite and diarrhea subsides to avoid dehydration and hypoglycemia  Consult Visit:none available  Medications: Outpatient Encounter Medications as of 02/28/2020  Medication Sig   Alcohol Swabs (B-D SINGLE USE SWABS REGULAR) PADS Use to monitor FSBS 3x daily for fluctuating FSBS. DX: 250.00   aspirin EC 81 MG tablet Take 81 mg by mouth daily.   Blood Glucose Calibration (TRUE METRIX LEVEL 2) Normal SOLN Use as directed to monitor FSBS 3x daily. Dx: E11.65   Blood Glucose Monitoring Suppl (TRUE METRIX AIR GLUCOSE METER) DEVI 1 each by Does not apply route daily. Use as directed to monitor FSBS 3x daily. Dx: E11.65   dapagliflozin propanediol (FARXIGA) 10 MG TABS tablet Take 10 mg by mouth daily before breakfast.   furosemide (LASIX) 20 MG tablet TAKE 1 TABLET EVERY DAY   gabapentin (NEURONTIN) 300 MG capsule TAKE 1 CAPSULE THREE TIMES DAILY   glipiZIDE (GLUCOTROL XL) 5 MG 24 hr tablet TAKE 1 TABLET  DAILY WITH BREAKFAST.   glucose blood (TRUE METRIX BLOOD GLUCOSE TEST) test strip Use as directed to monitor FSBS 3x daily. Dx: E11.65   lisinopril (ZESTRIL) 5 MG tablet TAKE 1 TABLET EVERY DAY   metFORMIN (GLUCOPHAGE) 1000 MG tablet TAKE 1 TABLET TWICE DAILY WITH A MEAL   metoprolol tartrate (LOPRESSOR) 50 MG tablet TAKE 1 TABLET TWICE DAILY   Multiple Vitamin (MULTIVITAMIN) tablet Take 1 tablet by mouth daily.    ondansetron (ZOFRAN) 4 MG tablet Take 4 mg by mouth every 8 (eight) hours as needed for nausea or vomiting.   pantoprazole (PROTONIX) 40 MG tablet TAKE 1 TABLET TWICE DAILY   pioglitazone (ACTOS) 30 MG tablet TAKE 1 TABLET EVERY DAY   potassium chloride (KLOR-CON) 10 MEQ tablet TAKE 1 TABLET EVERY DAY   pravastatin (PRAVACHOL) 40 MG tablet TAKE 1 TABLET EVERY DAY   traMADol (ULTRAM) 50 MG tablet TAKE 1 TABLET BY MOUTH EVERY 6 HOURS AS NEEDED FOR  CHRONIC  BACK  PAIN   TRUEPLUS LANCETS 30G MISC Use as directed to monitor FSBS 3x daily. Dx: EW09.81  TRULICITY 1.5 MXB/1.4NWSOPN INJECT 1.5MG (1 PEN) SUBCUTANEOUSLY EVERY WEEK AS DIRECTED   potassium chloride (K-DUR) 10 MEQ tablet Take 1 tablet (10 mEq total) by mouth daily. (Patient not taking: Reported on 02/28/2020)   sulfamethoxazole-trimethoprim (BACTRIM DS) 800-160 MG tablet Take 1 tablet by mouth 2 (two) times daily. (Patient not taking: Reported on 02/28/2020)   VITAMIN C-VITAMIN D-ZINC PO Take by mouth daily. (Patient not taking: Reported on 02/28/2020)   No facility-administered encounter medications on file as of 02/28/2020.     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    Difficulty of Paying Living Expenses: Not very hard     Goals Addressed             This Visit's Progress    Pharmacy Care Plan:       CARE PLAN ENTRY (see longitudinal plan of care  for additional care plan information)  Current Barriers:  Chronic Disease Management support, education, and care coordination needs related to Hypertension, Hyperlipidemia, and Diabetes   Hypertension BP Readings from Last 3 Encounters:  12/20/19 (!) 146/90  07/19/19 110/68  06/21/19 110/70   Pharmacist Clinical Goal(s): Over the next 180 days, patient will work with PharmD and providers to maintain BP goal <140/90 Current regimen:  Lisinopril 1m Metoprolol 5100mtwice daily Interventions: Reviewed home blood pressure readings Comprehensive  medication review Patient self care activities - Over the next 180 days, patient will: Check BP daily, document, and provide at future appointments Ensure daily salt intake < 2300 mg/day Contact PharmD if blood pressure stays on the low end less than 110/60.  Hyperlipidemia Lab Results  Component Value Date/Time   LDLCALC 104 (H) 12/15/2019 11:17 AM   Pharmacist Clinical Goal(s): Over the next 180 days, patient will work with PharmD and providers to achieve LDL goal < 100 Current regimen:  Pravastating 4049maily Interventions: Reviewed most recent lipid panel Counsel on medication adherence Patient self care activities - Over the next 180 days, patient will: Continue to take medication as directed Focus on medication adherence by pill box.  Diabetes Lab Results  Component Value Date/Time   HGBA1C 7.8 (H) 12/15/2019 11:17 AM   HGBA1C 7.4 (H) 06/21/2019 11:59 AM   Pharmacist Clinical Goal(s): Over the next 180 days, patient will work with PharmD and providers to achieve A1c goal <7% Current regimen:  Farxiga 90m09WJulicity 1.51.9JYr week Glipizide XL 5mg15mblet Metformin 1000mg21mce daily Pioglitazone 30mg 45my Interventions: Reviewed home blood sugar logs Counseled on diet beneficial to blood sugar control Patient self care activities - Over the next 180 days, patient will: Check blood sugar once daily, document, and provide at future appointments Contact provider with any episodes of hypoglycemia Switch cranberry juice to tablets to minimize sugar in diet  Initial goal documentation         Diabetes   Recent Relevant Labs: Lab Results  Component Value Date/Time   HGBA1C 7.8 (H) 12/15/2019 11:17 AM   HGBA1C 7.4 (H) 06/21/2019 11:59 AM   MICROALBUR 0.6 06/21/2019 11:59 AM   MICROALBUR 2.3 08/18/2017 12:08 PM     Checking BG: Daily  Recent FBG Readings: ranging from 140-207 mostly, occasional 110-120  Patient has failed these meds in past: Januvia  (cramps)  Patient is currently uncontrolled on the following medications: Farxiga 90mg, 78GNicity 1.5mg 5.6OZeek, glipizide XL 5mg ta68mt, metformin 1000mg tw92mdaily, pioglitazone 30mg dai57mLast diabetic Foot exam:  Lab Results  Component Value Date/Time   HMDIABEYEEXA Retinopathy (A) 03/25/2019 12:00 AM    Last diabetic Eye exam: No results found for: HMDIABFOOTEX   Patient reports no hypoglycemic episodes, lowest reported blood sugar was 107.  Reports has a hard time with diet because a lot of the stuff she should eat for her blood sugar she cannot eat because of stomach issues.  Likes to eat sweets (ice cream), and knows to avoid carbohydrates.   Drinks cranberry juice for urinary maintenance, drinks occasional sweet tea.  Patient requests refill on lancets and alcohol swabs.  Plan  Continue current medications.  Recheck A1c at visit in July.  Work on cutting back sweets and carbohydrates that elevate blood sugar.  Switch to cranberry tablets to avoid extra sugar from juice.  Hypertension     Office blood pressures are  BP Readings from Last 3 Encounters:  12/20/19 (!) 146/90  07/19/19 110/68  06/21/19 110/70    Patient has failed these meds in the past: none noted  Patient checks BP at home 3-5x per week  Patient home BP readings are ranging: no logs available, reports low blood pressures 84/56 up to 115/60s.  Patient is currently controlled on the following medications: lisinopril 51m, metoprolol 559mtwice daily We discussed: no dizziness reported, reports her blood pressure is always on the lower end.  Discussed importance of regular monitoring.  Reports medication adherence  Plan  Continue current medications.  Contact PharmD if BP is consistently < 100/60.     Hyperlipidemia   LDL goal < 100  Lipid Panel     Component Value Date/Time   CHOL 156 12/15/2019 1117   TRIG 126 12/15/2019 1117   HDL 29 (L) 12/15/2019 1117   LDLCALC 104 (H) 12/15/2019 1117     Hepatic Function Latest Ref Rng & Units 12/15/2019 06/21/2019 03/19/2019  Total Protein 6.1 - 8.1 g/dL 6.5 6.6 6.3  Albumin 3.6 - 5.1 g/dL - - -  AST 10 - 35 U/L 21 29 19   ALT 6 - 29 U/L 25 22 17   Alk Phosphatase 33 - 130 U/L - - -  Total Bilirubin 0.2 - 1.2 mg/dL 0.6 0.6 0.6     The 10-year ASCVD risk score (GMikey BussingC Jr., et al., 2013) is: 15.9%   Values used to calculate the score:     Age: [age]ears     Sex: Female     Is Non-Hispanic African American: No     Diabetic: Yes     Tobacco smoker: No     Systolic Blood Pressure: 14491mHg     Is BP treated: Yes     HDL Cholesterol: 29 mg/dL     Total Cholesterol: 156 mg/dL   Patient has failed these meds in past: none noted Patient is currently uncontrolled on the following medications:  Pravastatin 4062mWe discussed:  Reports adherence, discussed dietary changes to lower LDL.  LDL elevated at last MD visit.  Plan  Continue current medications, recheck lipid panel at next visit.  Consider increase on statin medication if cholesterol still elevated. Vaccines   Reviewed and discussed patient's vaccination history.    Immunization History  Administered Date(s) Administered   Influenza,inj,Quad PF,6+ Mos 05/30/2014, 05/29/2015, 07/02/2017, 05/22/2018   Pneumococcal Conjugate-13 08/18/2017   Pneumococcal Polysaccharide-23 08/29/2014    Plan  Recommended patient receive Shingrix vaccine in office.  Medication Management   Miscellaneous medications: potassium 37m13mfurosemide 20mg40mbapentin 300mg 90ms: ASA 81mg, 72mPatient currently uses [employer] #  [phone] reports using pill box method to organize medications and promote adherence. Patient denies missed doses of medication.   ChristiBeverly MilchD Clinical Pharmacist [phone] collaborated with the care management provider regarding care management and care coordination activities  outlined in this encounter and have reviewed this encounter including documentation in the note and care plan. I am certifying that I agree with the content of this note and encounter as supervising physician.

## 2020-02-28 ENCOUNTER — Ambulatory Visit: Payer: Medicare HMO | Admitting: Pharmacist

## 2020-02-28 ENCOUNTER — Other Ambulatory Visit: Payer: Self-pay

## 2020-02-28 DIAGNOSIS — I10 Essential (primary) hypertension: Secondary | ICD-10-CM

## 2020-02-28 DIAGNOSIS — E7800 Pure hypercholesterolemia, unspecified: Secondary | ICD-10-CM

## 2020-02-28 DIAGNOSIS — E118 Type 2 diabetes mellitus with unspecified complications: Secondary | ICD-10-CM

## 2020-02-28 NOTE — Patient Instructions (Addendum)
Visit Information Thank you for meeting with me today!  I look forward to working with you to help you meet all of your healthcare goals and answer any questions you may have.  Feel free to contact me anytime!  Goals Addressed            This Visit's Progress   . Pharmacy Care Plan:       CARE PLAN ENTRY (see longitudinal plan of care for additional care plan information)  Current Barriers:  . Chronic Disease Management support, education, and care coordination needs related to Hypertension, Hyperlipidemia, and Diabetes   Hypertension BP Readings from Last 3 Encounters:  12/20/19 (!) 146/90  07/19/19 110/68  06/21/19 110/70   . Pharmacist Clinical Goal(s): o Over the next 180 days, patient will work with PharmD and providers to maintain BP goal <140/90 . Current regimen:  o Lisinopril 5mg  o Metoprolol 50mg  twice daily . Interventions: o Reviewed home blood pressure readings o Comprehensive medication review . Patient self care activities - Over the next 180 days, patient will: o Check BP daily, document, and provide at future appointments o Ensure daily salt intake < 2300 mg/day o Contact PharmD if blood pressure stays on the low end less than 110/60.  Hyperlipidemia Lab Results  Component Value Date/Time   LDLCALC 104 (H) 12/15/2019 11:17 AM   . Pharmacist Clinical Goal(s): o Over the next 180 days, patient will work with PharmD and providers to achieve LDL goal < 100 . Current regimen:  o Pravastating 40mg  daily . Interventions: o Reviewed most recent lipid panel o Counsel on medication adherence . Patient self care activities - Over the next 180 days, patient will: o Continue to take medication as directed o Focus on medication adherence by pill box.  Diabetes Lab Results  Component Value Date/Time   HGBA1C 7.8 (H) 12/15/2019 11:17 AM   HGBA1C 7.4 (H) 06/21/2019 11:59 AM   . Pharmacist Clinical Goal(s): o Over the next 180 days, patient will work with  PharmD and providers to achieve A1c goal <7% . Current regimen:  o Farxiga 10mg  o Trulicity 1.5mg  per week o Glipizide XL 5mg  tablet o Metformin 1000mg  twice daily o Pioglitazone 30mg  daily . Interventions: o Reviewed home blood sugar logs o Counseled on diet beneficial to blood sugar control . Patient self care activities - Over the next 180 days, patient will: o Check blood sugar once daily, document, and provide at future appointments o Contact provider with any episodes of hypoglycemia o Switch cranberry juice to tablets to minimize sugar in diet  Initial goal documentation        Linda Briggs was given information about Chronic Care Management services today including:  1. CCM service includes personalized support from designated clinical staff supervised by her physician, including individualized plan of care and coordination with other care providers 2. 24/7 contact phone numbers for assistance for urgent and routine care needs. 3. Standard insurance, coinsurance, copays and deductibles apply for chronic care management only during months in which we provide at least 20 minutes of these services. Most insurances cover these services at 100%, however patients may be responsible for any copay, coinsurance and/or deductible if applicable. This service may help you avoid the need for more expensive face-to-face services. 4. Only one practitioner may furnish and bill the service in a calendar month. 5. The patient may stop CCM services at any time (effective at the end of the month) by phone call to the office staff.  Patient agreed to services and verbal consent obtained.   The patient verbalized understanding of instructions provided today and agreed to receive a mailed copy of patient instruction and/or educational materials. Telephone follow up appointment with pharmacy team member scheduled for: 45 months  Dylana Shaw, PharmD Clinical Pharmacist [facility] [phone]   Preventing High Cholesterol Cholesterol is a white, waxy substance similar to fat that the human body needs to help build cells. The liver makes all the cholesterol that a person's body needs. Having high cholesterol (hypercholesterolemia) increases a person's risk for heart disease and stroke. Extra (excess) cholesterol comes from the food the person eats. High cholesterol can often be prevented with diet and lifestyle changes. If you already have high cholesterol, you can control it with diet and lifestyle changes and with medicine. How can high cholesterol affect me? If you have high cholesterol, deposits (plaques) may build up on the walls of your arteries. The arteries are the blood vessels that carry blood away from your heart. Plaques make the arteries narrower and stiffer. This can limit or block blood flow and cause blood clots to form. Blood clots:  Are tiny balls of cells that form in your blood.  Can move to the heart or brain, causing a heart attack or stroke. Plaques in arteries greatly increase your risk for heart attack and stroke.Making diet and lifestyle changes can reduce your risk for these conditions that may threaten your life. What can increase my risk? This condition is more likely to develop in people who:  Eat foods that are high in saturated fat or cholesterol. Saturated fat is mostly found in: ? Foods that contain animal fat, such as red meat and some dairy products. ? Certain fatty foods made from plants, such as tropical oils.  Are overweight.  Are not getting enough exercise.  Have a family history of high cholesterol. What actions can I take to prevent this? Nutrition   Eat less saturated fat.  Avoid trans fats (partially hydrogenated oils). These are often found in margarine and in some baked goods, fried foods, and snacks bought in packages.  Avoid precooked or cured meat, such as sausages or meat loaves.  Avoid foods  and drinks that have added sugars.  Eat more fruits, vegetables, and whole grains.  Choose healthy sources of protein, such as fish, poultry, lean cuts of red meat, beans, peas, lentils, and nuts.  Choose healthy sources of fat, such as: ? Nuts. ? Vegetable oils, especially olive oil. ? Fish that have healthy fats (omega-3 fatty acids), such as mackerel or salmon. The items listed above may not be a complete list of recommended foods and beverages. Contact a dietitian for more information. Lifestyle  Lose weight if you are overweight. Losing 5-10 lb (2.3-4.5 kg) can help prevent or control high cholesterol. It can also lower your risk for diabetes and high blood pressure. Ask your health care provider to help you with a diet and exercise plan to lose weight safely.  Do not use any products that contain nicotine or tobacco, such as cigarettes, e-cigarettes, and chewing tobacco. If you need help quitting, ask your health care provider.  Limit your alcohol intake. ? Do not drink alcohol if:  Your health care provider tells you not to drink.  You are pregnant, may be pregnant, or are planning to become pregnant. ? If you drink alcohol:  Limit how much you use to:  0-1 drink a day for women.  0-2  drinks a day for men.  Be aware of how much alcohol is in your drink. In the [place] one drink equals one 12 oz bottle of beer (355 mL), one 5 oz glass of wine (148 mL), or one 1 oz glass of hard liquor (44 mL). Activity   Get enough exercise. Each week, do at least 150 minutes of exercise that takes a medium level of effort (moderate-intensity exercise). ? This is exercise that:  Makes your heart beat faster and makes you breathe harder than usual.  Allows you to still be able to talk. ? You could exercise in short sessions several times a day or longer sessions a few times a week. For example, on 5 days each week, you could walk fast or ride your bike 3 times a day for 10 minutes each  time.  Do exercises as told by your health care provider. Medicines  In addition to diet and lifestyle changes, your health care provider may recommend medicines to help lower cholesterol. This may be a medicine to lower the amount of cholesterol your liver makes. You may need medicine if: ? Diet and lifestyle changes do not lower your cholesterol enough. ? You have high cholesterol and other risk factors for heart disease or stroke.  Take over-the-counter and prescription medicines only as told by your health care provider. General information  Manage your risk factors for high cholesterol. Talk with your health care provider about all your risk factors and how to lower your risk.  Manage other conditions that you have, such as diabetes or high blood pressure (hypertension).  Have blood tests to check your cholesterol levels at regular points in time as told by your health care provider.  Keep all follow-up visits as told by your health care provider. This is important. Where to find more information  American Heart Association: www.heart.org  National Heart, Lung, and Blood Institute: [employer].is Summary  High cholesterol increases your risk for heart disease and stroke. By keeping your cholesterol level low, you can reduce your risk for these conditions.  High cholesterol can often be prevented with diet and lifestyle changes.  Work with your health care provider to manage your risk factors, and have your blood tested regularly. This information is not intended to replace advice given to you by your health care provider. Make sure you discuss any questions you have with your health care provider. Document Revised: 12/11/2018 Document Reviewed: 04/27/2016 Elsevier Patient Education  2020 [employer].

## 2020-02-29 ENCOUNTER — Other Ambulatory Visit: Payer: Self-pay

## 2020-02-29 MED ORDER — BD SWAB SINGLE USE REGULAR PADS: MEDICATED_PAD | 1 refills | Status: DC

## 2020-02-29 MED ORDER — TRUEPLUS LANCETS 30G MISC: 3 refills | Status: DC

## 2020-03-13 ENCOUNTER — Other Ambulatory Visit: Payer: Self-pay | Admitting: Family Medicine

## 2020-03-20 ENCOUNTER — Other Ambulatory Visit: Payer: Self-pay

## 2020-03-20 ENCOUNTER — Ambulatory Visit (INDEPENDENT_AMBULATORY_CARE_PROVIDER_SITE_OTHER): Payer: Medicare HMO | Admitting: Family Medicine

## 2020-03-20 VITALS — BP 110/80 | HR 82 | Temp 98.5°F | Ht 66.0 in | Wt 310.0 lb

## 2020-03-20 DIAGNOSIS — E118 Type 2 diabetes mellitus with unspecified complications: Secondary | ICD-10-CM | POA: Diagnosis not present

## 2020-03-20 DIAGNOSIS — I10 Essential (primary) hypertension: Secondary | ICD-10-CM

## 2020-03-20 DIAGNOSIS — E7800 Pure hypercholesterolemia, unspecified: Secondary | ICD-10-CM

## 2020-03-20 DIAGNOSIS — E113299 Type 2 diabetes mellitus with mild nonproliferative diabetic retinopathy without macular edema, unspecified eye: Secondary | ICD-10-CM | POA: Diagnosis not present

## 2020-03-20 NOTE — Progress Notes (Signed)
Subjective:    Patient ID: Linda Briggs, female    DOB: 08/06/1956, 64 y.o.   MRN: [id number] 4/21 Patient has a history of diabetes mellitus type 2 currently on a combination of Trulicity, Metformin, actos and glipizide.  She has a history of nonproliferative diabetic retinopathy.  She also has severe diabetic neuropathy.  She has essentially no sensation in her toes on either foot.  She also occasionally gets burning pain in her feet.  Her blood pressure today is elevated at 146/90 but she had to take her sister to the hospital this morning and is upset by that.  She denies any polyuria, polydipsia, or blurry vision.   She denies any episodes of hypoglycemia.  She denies any myalgias or right upper quadrant pain.  She is due for pap which she declines.  Due for colonoscopy and cologuard but she declines both.  She is also due for covid shot which she is uncertain about.  At that time, my plan was: After long discussion, we decided to ad farxiga 10 mg a day.  Will recheck labs in 3 months.  BP is elevated due to situational stress.  She will montior at home and notify me if greater than 140/90.  Discussed and recommended COVID 19 vaccine.  Cholesterol is acceptable.  Declines pap and colonoscopy/cologuard.   03/20/20 Patient continues to decline Pap smear.  She continues to decline the Covid vaccination.  Her blood pressure today is well controlled at 110/80.  Since adding [place], she states that her blood sugars have improved.  The majority of her blood sugars are between 101 130 fasting however she occasionally has a fasting blood sugar near 200 especially if she eats sweets at night.  She is not getting any regular exercise.  She continues to have no sensation in her feet.  She is not checking her foot every night for wounds or ulcers.  She denies any chest pain shortness of breath or dyspnea on exertion.  She denies any myalgias or right upper quadrant pain.  She denies any polyuria, polydipsia, or  blurry vision  Past Medical History:  Diagnosis Date  . Arthritis    feet & toes  . Bulging disc   . Diabetes mellitus   . Gastroparesis   . History of hepatitis B   . Hyperlipidemia   . Hypertension   . Neuromuscular disorder (HCC)    in feet and fingers   . Neuropathy    feet  . Nonproliferative diabetic retinopathy (HCC)   . 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 Peripheral vascular disease (HCC)   . Shortness of breath dyspnea    on lopressor   . Sleep apnea    pt had study done but it was incomplete due to blood sugar issues    Past Surgical History:  Procedure Laterality Date  . BIOPSY  04/28/2018   Procedure: BIOPSY;  Surgeon: Meryl DareStark, Malcolm T, MD;  [facility];  Service: Endoscopy;;  . BIOPSY THYROID     january 2016  . ESOPHAGOGASTRODUODENOSCOPY (EGD) WITH PROPOFOL N/A 04/28/2018   Procedure: ESOPHAGOGASTRODUODENOSCOPY (EGD) WITH PROPOFOL;  Surgeon: Meryl DareStark, Malcolm T, MD;  Location: [facility];  Service: Endoscopy;  Laterality: N/A;  . HYSTEROSCOPY WITH D & C N/A 02/15/2015   Procedure: DILATATION AND CURETTAGE /HYSTEROSCOPY;  Surgeon: Mitchel HonourMegan Morris, DO;  Location: [facility];  Service: Gynecology;  Laterality: N/A;  . TONSILLECTOMY     Current Outpatient Medications on File Prior to  Visit  Medication Sig Dispense Refill  . Alcohol Swabs (B-D SINGLE USE SWABS REGULAR) PADS Use to monitor FSBS 3x daily for fluctuating FSBS. DX: 250.00 300 each 1  . aspirin EC 81 MG tablet Take 81 mg by mouth daily.    . Blood Glucose Calibration (TRUE METRIX LEVEL 2) Normal SOLN Use as directed to monitor FSBS 3x daily. Dx: E11.65 1 each 1  . Blood Glucose Monitoring Suppl (TRUE METRIX AIR GLUCOSE METER) DEVI 1 each by Does not apply route daily. Use as directed to monitor FSBS 3x daily. Dx: E11.65 1 each 5  . dapagliflozin propanediol (FARXIGA) 10 MG TABS tablet Take 10 mg by mouth daily before breakfast. 30 tablet 3  . furosemide (LASIX) 20 MG tablet TAKE 1 TABLET EVERY  DAY 90 tablet 0  . gabapentin (NEURONTIN) 300 MG capsule TAKE 1 CAPSULE THREE TIMES DAILY 270 capsule 1  . glipiZIDE (GLUCOTROL XL) 5 MG 24 hr tablet TAKE 1 TABLET  DAILY WITH BREAKFAST. 90 tablet 1  . glucose blood (TRUE METRIX BLOOD GLUCOSE TEST) test strip Use as directed to monitor FSBS 3x daily. Dx: E11.65 300 each 3  . lisinopril (ZESTRIL) 5 MG tablet TAKE 1 TABLET EVERY DAY 90 tablet 3  . metFORMIN (GLUCOPHAGE) 1000 MG tablet TAKE 1 TABLET TWICE DAILY WITH A MEAL 180 tablet 1  . metoprolol tartrate (LOPRESSOR) 50 MG tablet TAKE 1 TABLET TWICE DAILY 180 tablet 2  . Multiple Vitamin (MULTIVITAMIN) tablet Take 1 tablet by mouth daily.    . ondansetron (ZOFRAN) 4 MG tablet Take 4 mg by mouth every 8 (eight) hours as needed for nausea or vomiting.    . pantoprazole (PROTONIX) 40 MG tablet TAKE 1 TABLET TWICE DAILY 180 tablet 3  . pioglitazone (ACTOS) 30 MG tablet TAKE 1 TABLET EVERY DAY 90 tablet 0  . potassium chloride (K-DUR) 10 MEQ tablet Take 1 tablet (10 mEq total) by mouth daily. 90 tablet 3  . potassium chloride (KLOR-CON) 10 MEQ tablet TAKE 1 TABLET EVERY DAY 90 tablet 1  . pravastatin (PRAVACHOL) 40 MG tablet TAKE 1 TABLET EVERY DAY 90 tablet 3  . sulfamethoxazole-trimethoprim (BACTRIM DS) 800-160 MG tablet Take 1 tablet by mouth 2 (two) times daily. 10 tablet 0  . traMADol (ULTRAM) 50 MG tablet TAKE 1 TABLET BY MOUTH EVERY 6 HOURS AS NEEDED FOR  CHRONIC  BACK  PAIN 60 tablet 0  . TRUEplus Lancets 30G MISC Use as directed to monitor FSBS 3x daily. Dx: E11.65 200 each 3  . TRULICITY 1.5 MG/0.5ML SOPN INJECT 1.5MG  (1 PEN) SUBCUTANEOUSLY EVERY WEEK AS DIRECTED 12 mL 2  . VITAMIN C-VITAMIN D-ZINC PO Take by mouth daily.      No current facility-administered medications on file prior to visit.   Allergies  Allergen Reactions  . Codeine Anaphylaxis and Hives  . Penicillins Anaphylaxis, Hives and Other (See Comments)    Has patient had a PCN reaction causing immediate rash,  facial/tongue/throat swelling, SOB or lightheadedness with hypotension: No Has patient had a PCN reaction causing severe rash involving mucus membranes or skin necrosis: No Has patient had a PCN reaction that required hospitalization: No Has patient had a PCN reaction occurring within the last 10 years: No If all of the above answers are "NO", then may proceed with Cephalosporin use.   . Ciprofloxacin Other (See Comments)    Tongue Swelling  . Food Nausea And Vomiting and Other (See Comments)    Olives   . Januvia [Sitagliptin]  Other (See Comments)    Stomach cramps  . Latex Hives   Social History   Socioeconomic History  . Marital status: Single    Spouse name: Not on file  . Number of children: 0  . Years of education: Not on file  . Highest education level: Not on file  Occupational History  . Occupation: retired  Tobacco Use  . Smoking status: Never Smoker  . Smokeless tobacco: Never Used  Vaping Use  . Vaping Use: Never used  Substance and Sexual Activity  . Alcohol use: No    Comment: social drinker.  . Drug use: No  . Sexual activity: Not on file  Other Topics Concern  . Not on file  Social History Narrative  . Not on file   Social Determinants of Health   Financial Resource Strain: Low Risk   . Difficulty of Paying Living Expenses: Not very hard  Food Insecurity:   . Worried About Programme researcher, broadcasting/film/video in the Last Year:   . Barista in the Last Year:   Transportation Needs:   . Freight forwarder (Medical):   Marland Kitchen Lack of Transportation (Non-Medical):   Physical Activity:   . Days of Exercise per Week:   . Minutes of Exercise per Session:   Stress:   . Feeling of Stress :   Social Connections:   . Frequency of Communication with Friends and Family:   . Frequency of Social Gatherings with Friends and Family:   . Attends Religious Services:   . Active Member of Clubs or Organizations:   . Attends Banker Meetings:   Marland Kitchen Marital  Status:   Intimate Partner Violence:   . Fear of Current or Ex-Partner:   . Emotionally Abused:   Marland Kitchen Physically Abused:   . Sexually Abused:    Family History  Problem Relation Age of Onset  . Hyperlipidemia Mother   . Heart disease Father   . Hyperlipidemia Father   . Hypertension Father   . Heart disease Brother   . Stroke Brother   . Kidney disease Sister   . Kidney cancer Sister   . Ovarian cancer Maternal Aunt   . Kidney disease Maternal Aunt        x 2  . Kidney disease Maternal Uncle        x 2  . Colon cancer Neg Hx   . Stomach cancer Neg Hx       Review of Systems  All other systems reviewed and are negative.      Objective:   Physical Exam Vitals reviewed.  Constitutional:      General: She is not in acute distress.    Appearance: She is well-developed. She is not diaphoretic.  HENT:     Head: Normocephalic and atraumatic.     Right Ear: External ear normal.     Left Ear: External ear normal.     Nose: Nose normal.     Mouth/Throat:     Pharynx: No oropharyngeal exudate.  Eyes:     General: No scleral icterus.       Right eye: No discharge.        Left eye: No discharge.     Conjunctiva/sclera: Conjunctivae normal.     Pupils: Pupils are equal, round, and reactive to light.  Neck:     Thyroid: No thyromegaly.     Vascular: No JVD.     Trachea: No tracheal deviation.  Cardiovascular:  Rate and Rhythm: Normal rate and regular rhythm.     Heart sounds: Normal heart sounds. No murmur heard.  No friction rub. No gallop.   Pulmonary:     Effort: Pulmonary effort is normal. No respiratory distress.     Breath sounds: Normal breath sounds. No stridor. No wheezing or rales.  Chest:     Chest wall: No tenderness.  Abdominal:     General: Bowel sounds are normal. There is no distension.     Palpations: Abdomen is soft. There is no mass.     Tenderness: There is no abdominal tenderness. There is no guarding or rebound.  Musculoskeletal:         General: No tenderness. Normal range of motion.     Cervical back: Normal range of motion and neck supple.  Lymphadenopathy:     Cervical: No cervical adenopathy.  Skin:    General: Skin is warm.     Coloration: Skin is not pale.     Findings: No erythema or rash.  Neurological:     Mental Status: She is alert and oriented to person, place, and time.     Cranial Nerves: No cranial nerve deficit.     Motor: No abnormal muscle tone.     Coordination: Coordination normal.     Deep Tendon Reflexes: Reflexes are normal and symmetric.  Psychiatric:        Behavior: Behavior normal.        Thought Content: Thought content normal.        Judgment: Judgment normal.           Assessment & Plan:   Type 2 diabetes mellitus with complications (HCC) - Plan: CBC with Differential/Platelet, COMPLETE METABOLIC PANEL WITH GFR, Lipid panel, Hemoglobin A1c  Essential hypertension  Morbid obesity (HCC)  Pure hypercholesterolemia  Nonproliferative diabetic retinopathy (HCC)  Patient's blood pressure is excellent.  I strongly encouraged the patient to get the Covid vaccination.  She would be a high risk candidate were she to get the virus.  Also recommended a Pap smear and offered to perform one today but she declined.  I will check a CBC, CMP, fasting lipid panel, and hemoglobin A1c.  If her hemoglobin A1c is greater than 8, I will transition the patient to insulin.  I would start the insulin dose at 10 units a day and discontinue glipizide.  I would then gradually increase until fasting blood sugars were less than 130 and slowly try to wean the patient off all medications except for Metformin.  Recommended that she check her feet every night with a mirror.  Goal LDL cholesterol is less than 100

## 2020-03-21 LAB — LIPID PANEL
Cholesterol: 168 mg/dL (ref <200)
HDL: 29 mg/dL — ABNORMAL LOW (ref >=50)
LDL Cholesterol (Calc): 109 mg/dL (calc) — ABNORMAL HIGH
Non-HDL Cholesterol (Calc): 139 mg/dL (calc) — ABNORMAL HIGH (ref <130)
Total CHOL/HDL Ratio: 5.8 (calc) — ABNORMAL HIGH (ref <5.0)
Triglycerides: 188 mg/dL — ABNORMAL HIGH (ref <150)

## 2020-03-21 LAB — COMPLETE METABOLIC PANEL WITH GFR
AG Ratio: 1.5 (calc) (ref 1.0–2.5)
ALT: 43 U/L — ABNORMAL HIGH (ref 6–29)
AST: 47 U/L — ABNORMAL HIGH (ref 10–35)
Albumin: 4.2 g/dL (ref 3.6–5.1)
Alkaline phosphatase (APISO): 64 U/L (ref 37–153)
BUN/Creatinine Ratio: 17 (calc) (ref 6–22)
BUN: 20 mg/dL (ref 7–25)
CO2: 29 mmol/L (ref 20–32)
Calcium: 9.3 mg/dL (ref 8.6–10.4)
Chloride: 99 mmol/L (ref 98–110)
Creat: 1.17 mg/dL — ABNORMAL HIGH (ref 0.50–0.99)
GFR, Est African American: 58 mL/min/{1.73_m2} — ABNORMAL LOW (ref >=60)
GFR, Est Non African American: 50 mL/min/{1.73_m2} — ABNORMAL LOW (ref >=60)
Globulin: 2.8 g/dL (calc) (ref 1.9–3.7)
Glucose, Bld: 148 mg/dL — ABNORMAL HIGH (ref 65–99)
Potassium: 4.7 mmol/L (ref 3.5–5.3)
Sodium: 139 mmol/L (ref 135–146)
Total Bilirubin: 0.8 mg/dL (ref 0.2–1.2)
Total Protein: 7 g/dL (ref 6.1–8.1)

## 2020-03-21 LAB — CBC WITH DIFFERENTIAL/PLATELET
Absolute Monocytes: 551 cells/uL (ref 200–950)
Basophils Absolute: 62 cells/uL (ref 0–200)
Basophils Relative: 0.6 %
Eosinophils Absolute: 354 cells/uL (ref 15–500)
Eosinophils Relative: 3.4 %
HCT: 44.5 % (ref 35.0–45.0)
Hemoglobin: 14.7 g/dL (ref 11.7–15.5)
Lymphs Abs: 2309 cells/uL (ref 850–3900)
MCH: 30.9 pg (ref 27.0–33.0)
MCHC: 33 g/dL (ref 32.0–36.0)
MCV: 93.5 fL (ref 80.0–100.0)
MPV: 10.6 fL (ref 7.5–12.5)
Monocytes Relative: 5.3 %
Neutro Abs: 7124 cells/uL (ref 1500–7800)
Neutrophils Relative %: 68.5 %
Platelets: 135 10*3/uL — ABNORMAL LOW (ref 140–400)
RBC: 4.76 10*6/uL (ref 3.80–5.10)
RDW: 13.1 % (ref 11.0–15.0)
Total Lymphocyte: 22.2 %
WBC: 10.4 10*3/uL (ref 3.8–10.8)

## 2020-03-21 LAB — HEMOGLOBIN A1C
Hgb A1c MFr Bld: 7 % of total Hgb — ABNORMAL HIGH (ref <5.7)
Mean Plasma Glucose: 154 (calc)
eAG (mmol/L): 8.5 (calc)

## 2020-03-24 ENCOUNTER — Telehealth: Payer: Self-pay | Admitting: *Deleted

## 2020-03-24 DIAGNOSIS — E041 Nontoxic single thyroid nodule: Secondary | ICD-10-CM

## 2020-03-24 NOTE — Telephone Encounter (Signed)
ok 

## 2020-03-24 NOTE — Telephone Encounter (Signed)
Received call from patient.   Requested PCP to order Thyroid US as F/U from 2019.  Ok to order for thyroid nodules?

## 2020-03-27 NOTE — Telephone Encounter (Signed)
US ordered

## 2020-04-06 ENCOUNTER — Encounter (INDEPENDENT_AMBULATORY_CARE_PROVIDER_SITE_OTHER): Payer: Self-pay

## 2020-04-06 ENCOUNTER — Ambulatory Visit
Admission: RE | Admit: 2020-04-06 | Discharge: 2020-04-06 | Disposition: A | Discharge status: Home / Self Care | Payer: Medicare HMO | Source: Ambulatory Visit | Attending: Family Medicine | Admitting: Family Medicine

## 2020-04-06 DIAGNOSIS — E041 Nontoxic single thyroid nodule: Secondary | ICD-10-CM | POA: Diagnosis not present

## 2020-04-07 ENCOUNTER — Other Ambulatory Visit: Payer: Self-pay

## 2020-04-07 MED ORDER — DAPAGLIFLOZIN PROPANEDIOL 10 MG PO TABS: 10.0000 mg | ORAL_TABLET | Freq: Every day | ORAL | 3 refills | Status: DC

## 2020-04-12 ENCOUNTER — Other Ambulatory Visit: Payer: Self-pay

## 2020-04-12 MED ORDER — METOPROLOL TARTRATE 50 MG PO TABS: 50.0000 mg | ORAL_TABLET | Freq: Two times a day (BID) | ORAL | 2 refills | Status: DC

## 2020-05-19 ENCOUNTER — Other Ambulatory Visit: Payer: Self-pay | Admitting: Nurse Practitioner

## 2020-06-01 ENCOUNTER — Other Ambulatory Visit: Payer: Self-pay | Admitting: Family Medicine

## 2020-06-02 ENCOUNTER — Other Ambulatory Visit: Payer: Self-pay | Admitting: Family Medicine

## 2020-06-15 ENCOUNTER — Other Ambulatory Visit: Payer: Self-pay | Admitting: Family Medicine

## 2020-06-29 ENCOUNTER — Other Ambulatory Visit: Payer: Self-pay | Admitting: Family Medicine

## 2020-07-07 ENCOUNTER — Other Ambulatory Visit: Payer: Self-pay

## 2020-07-07 MED ORDER — LISINOPRIL 5 MG PO TABS
5.0000 mg | ORAL_TABLET | Freq: Every day | ORAL | 3 refills | Status: DC
Start: 2020-07-07 — End: 2021-05-11

## 2020-07-11 ENCOUNTER — Other Ambulatory Visit: Payer: Self-pay

## 2020-07-11 MED ORDER — PANTOPRAZOLE SODIUM 40 MG PO TBEC: 40.0000 mg | DELAYED_RELEASE_TABLET | Freq: Every day | ORAL | 2 refills | Status: DC

## 2020-07-19 NOTE — Progress Notes (Signed)
Cardiology Office Note    Date:  07/21/2020   ID:  Linda Briggs, DOB Mar 07, 1956, MRN [id number]  PCP:  Donita Brooks, MD  Cardiologist:  Julien Nordmann, MD  Electrophysiologist:  None   Chief Complaint: Shortness of breath/lower extremity swelling  History of Present Illness:   Linda Briggs is a 64 y.o. female with history of insulin-dependent diabetes mellitus with peripheral neuropathy, HTN, HLD, morbid obesity, chronic lower extremity swelling with lymphedema, reported COVID-19 in 11/2018, and sinus tachycardia who presents for evaluation of shortness of breath and lower extremity swelling.  Prior Holter monitor in 03/2012 showed normal sinus rhythm with rare PACs and frequent periods of sinus tachycardia with no significant arrhythmias.  Echo in 04/2012 showed an EF of 55%, no regional wall motion abnormalities, normal LV diastolic function parameters, normal RV systolic function and PASP.  No significant valvular abnormalities.  Carotid artery ultrasound in 2016 showed minimal disease. She reports a clinical diagnosis of COVID-19 in 11/2018 with loss of taste associated with URI symptoms. No formal testing at that time. She was most recently seen in the office in 07/2019 and was doing well from a cardiac perspective noting rare atypical episodes of chest pain.  She indicated her heart rate was well controlled on metoprolol 25 mg twice daily and at times took 50 mg.  She comes in today indicating since her clinical diagnosis of COVID-19 pneumonia in 11/2018 she has had progressive shortness of breath with exertion coupled with worsening lower extremity swelling. No chest pain, worsening palpitations, dizziness, presyncope, or syncope. She has attributed her exertional shortness of breath to being overweight and deconditioned. She sits with her feet hanging down for prolonged timeframes without elevation. At times she has noted some weeping and even bleeding coming from her lower  extremity swelling. She does feel like the swelling worsened when she was placed on Actos. She has self tapered her Actos with noted improvement in lower extremity edema. It is difficult to assess her weight trend as she reports the documented weight at her last visit in 07/2019 in our office was an accurate dictating she has not weighed less than 300 pounds in quite some time. She states her weight has actually been decreasing it through improved diet attempting to limit her carbs. She has no orthopnea, PND, abdominal distention, or early satiety.   Labs independently reviewed: 03/2020 - A1c 7.0, TC 168, TG 188, HDL 29, LDL 109, BUN 20, serum creatinine 1.17, potassium 4.7, albumin 4.2, AST 47, ALT 43, Hgb 14.7, PLT 135 08/2014 - TSH normal  Past Medical History:  Diagnosis Date  . Arthritis    feet & toes  . Bulging disc   . Diabetes mellitus   . Gastroparesis   . History of hepatitis B   . Hyperlipidemia   . Hypertension   . Neuromuscular disorder (HCC)    in feet and fingers   . Neuropathy    feet  . Nonproliferative diabetic retinopathy (HCC)   . 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 Peripheral vascular disease (HCC)   . Shortness of breath dyspnea    on lopressor   . Sleep apnea    pt had study done but it was incomplete due to blood sugar issues     Past Surgical History:  Procedure Laterality Date  . BIOPSY  04/28/2018   Procedure: BIOPSY;  Surgeon: Meryl Dare, MD;  Location: [facility];  Service: Endoscopy;;  . BIOPSY  THYROID     january 2016  . ESOPHAGOGASTRODUODENOSCOPY (EGD) WITH PROPOFOL N/A 04/28/2018   Procedure: ESOPHAGOGASTRODUODENOSCOPY (EGD) WITH PROPOFOL;  Surgeon: Meryl Dare, MD;  Location: [facility];  Service: Endoscopy;  Laterality: N/A;  . HYSTEROSCOPY WITH D & C N/A 02/15/2015   Procedure: DILATATION AND CURETTAGE /HYSTEROSCOPY;  Surgeon: Mitchel Honour, DO;  Location: [facility];  Service: Gynecology;  Laterality: N/A;  . TONSILLECTOMY       Current Medications: Current Meds  Medication Sig  . Alcohol Swabs (B-D SINGLE USE SWABS REGULAR) PADS Use to monitor FSBS 3x daily for fluctuating FSBS. DX: 250.00  . aspirin EC 81 MG tablet Take 81 mg by mouth daily.  . Blood Glucose Calibration (TRUE METRIX LEVEL 2) Normal SOLN Use as directed to monitor FSBS 3x daily. Dx: E11.65  . Blood Glucose Monitoring Suppl (TRUE METRIX AIR GLUCOSE METER) DEVI 1 each by Does not apply route daily. Use as directed to monitor FSBS 3x daily. Dx: E11.65  . FARXIGA 10 MG TABS tablet TAKE 1 TABLET EVERY DAY BEFORE BREAKFAST  . furosemide (LASIX) 20 MG tablet TAKE 1 TABLET EVERY DAY  . gabapentin (NEURONTIN) 300 MG capsule TAKE 1 CAPSULE THREE TIMES DAILY  . glipiZIDE (GLUCOTROL XL) 5 MG 24 hr tablet TAKE 1 TABLET  DAILY WITH BREAKFAST.  Marland Kitchen glucose blood (TRUE METRIX BLOOD GLUCOSE TEST) test strip Use as directed to monitor FSBS 3x daily. Dx: E11.65  . lisinopril (ZESTRIL) 5 MG tablet Take 1 tablet (5 mg total) by mouth daily.  . metFORMIN (GLUCOPHAGE) 1000 MG tablet TAKE 1 TABLET TWICE DAILY WITH A MEAL  . metoprolol tartrate (LOPRESSOR) 50 MG tablet Take 0.5 tablet (25MG ) by mouth Twice a day  . Multiple Vitamin (MULTIVITAMIN) tablet Take 1 tablet by mouth daily.  . ondansetron (ZOFRAN) 4 MG tablet Take 4 mg by mouth every 8 (eight) hours as needed for nausea or vomiting.  . pantoprazole (PROTONIX) 40 MG tablet Take 1 tablet (40 mg total) by mouth daily.  . pioglitazone (ACTOS) 30 MG tablet Take one tablet (30MG ) every other day  . potassium chloride (KLOR-CON) 10 MEQ tablet TAKE 1 TABLET EVERY DAY  . pravastatin (PRAVACHOL) 40 MG tablet TAKE 1 TABLET EVERY DAY  . traMADol (ULTRAM) 50 MG tablet TAKE 1 TABLET BY MOUTH EVERY 6 HOURS AS NEEDED FOR  CHRONIC  BACK  PAIN  . TRUEplus Lancets 30G MISC Use as directed to monitor FSBS 3x daily. Dx: E11.65  . TRULICITY 1.5 MG/0.5ML SOPN INJECT 1.5MG  (1 PEN) SUBCUTANEOUSLY EVERY WEEK AS DIRECTED    Allergies:    Codeine, Penicillins, Ciprofloxacin, Food, Januvia [sitagliptin], and Latex   Social History   Socioeconomic History  . Marital status: Single    Spouse name: Not on file  . Number of children: 0  . Years of education: Not on file  . Highest education level: Not on file  Occupational History  . Occupation: retired  Tobacco Use  . Smoking status: Never Smoker  . Smokeless tobacco: Never Used  Vaping Use  . Vaping Use: Never used  Substance and Sexual Activity  . Alcohol use: No    Comment: social drinker.  . Drug use: No  . Sexual activity: Not on file  Other Topics Concern  . Not on file  Social History Narrative  . Not on file   Social Determinants of Health   Financial Resource Strain: Low Risk   . Difficulty of Paying Living Expenses: Not very hard  Food  Insecurity:   . Worried About Programme researcher, broadcasting/film/videounning Out of Food in the Last Year: Not on file  . Ran Out of Food in the Last Year: Not on file  Transportation Needs:   . Lack of Transportation (Medical): Not on file  . Lack of Transportation (Non-Medical): Not on file  Physical Activity:   . Days of Exercise per Week: Not on file  . Minutes of Exercise per Session: Not on file  Stress:   . Feeling of Stress : Not on file  Social Connections:   . Frequency of Communication with Friends and Family: Not on file  . Frequency of Social Gatherings with Friends and Family: Not on file  . Attends Religious Services: Not on file  . Active Member of Clubs or Organizations: Not on file  . Attends BankerClub or Organization Meetings: Not on file  . Marital Status: Not on file     Family History:  The patient's family history includes Heart disease in her brother and father; Hyperlipidemia in her father and mother; Hypertension in her father; Kidney cancer in her sister; Kidney disease in her maternal aunt, maternal uncle, and sister; Ovarian cancer in her maternal aunt; Stroke in her brother. There is no history of Colon cancer or Stomach  cancer.  ROS:   Review of Systems  Constitutional: Positive for malaise/fatigue. Negative for chills, diaphoresis, fever and weight loss.  HENT: Negative for congestion.   Eyes: Negative for discharge and redness.  Respiratory: Positive for shortness of breath. Negative for cough, sputum production and wheezing.   Cardiovascular: Positive for leg swelling. Negative for chest pain, palpitations, orthopnea, claudication and PND.  Gastrointestinal: Negative for abdominal pain, heartburn, nausea and vomiting.  Musculoskeletal: Negative for falls and myalgias.  Skin: Negative for rash.  Neurological: Positive for weakness. Negative for dizziness, tingling, tremors, sensory change, speech change, focal weakness and loss of consciousness.  Endo/Heme/Allergies: Does not bruise/bleed easily.  Psychiatric/Behavioral: Negative for substance abuse. The patient is not nervous/anxious.   All other systems reviewed and are negative.    EKGs/Labs/Other Studies Reviewed:    Studies reviewed were summarized above. The additional studies were reviewed today:  2D echo 04/2012: - Left ventricle: The cavity size was normal. There was mild  concentric hypertrophy. Systolic function was normal. The  estimated ejection fraction was in the range of 55% to  60%. Wall motion was normal; there were no regional wall  motion abnormalities. Left ventricular diastolic function  parameters were normal.  - Right ventricle: Systolic function was normal.  - Pulmonary arteries: Systolic pressure was within the  normal range.  Impressions:   - Normal study.   EKG:  EKG is ordered today.  The EKG ordered today demonstrates NSR, 72 bpm, left axis deviation, poor R wave progression along the precordial leads, unable to exclude prior anterior infarct, no acute ST-T changes  Recent Labs: 03/20/2020: ALT 43; BUN 20; Creat 1.17; Hemoglobin 14.7; Platelets 135; Potassium 4.7; Sodium 139  Recent Lipid Panel     Component Value Date/Time   CHOL 168 03/20/2020 1146   TRIG 188 (H) 03/20/2020 1146   HDL 29 (L) 03/20/2020 1146   CHOLHDL 5.8 (H) 03/20/2020 1146   VLDL 24 02/10/2017 1217   LDLCALC 109 (H) 03/20/2020 1146    PHYSICAL EXAM:    VS:  BP 120/74 (BP Location: Left Arm, Patient Position: Sitting, Cuff Size: Normal)   Pulse 72   Ht 5\' 6"  (1.676 m)   Wt (!) 308 lb (139.7 kg)  SpO2 95%   BMI 49.71 kg/m   BMI: Body mass index is 49.71 kg/m.  Physical Exam Constitutional:      Appearance: She is well-developed.  HENT:     Head: Normocephalic and atraumatic.  Eyes:     General:        Right eye: No discharge.        Left eye: No discharge.  Neck:     Vascular: No JVD.  Cardiovascular:     Rate and Rhythm: Normal rate and regular rhythm.     Pulses: No midsystolic click and no opening snap.          Posterior tibial pulses are 1+ on the right side and 1+ on the left side.     Heart sounds: S1 normal and S2 normal. Heart sounds not distant. Murmur heard.  Systolic murmur is present with a grade of 1/6.  No friction rub.  Pulmonary:     Effort: Pulmonary effort is normal. No respiratory distress.     Breath sounds: Normal breath sounds. No decreased breath sounds, wheezing or rales.  Chest:     Chest wall: No tenderness.  Abdominal:     General: There is no distension.     Palpations: Abdomen is soft.     Tenderness: There is no abdominal tenderness.  Musculoskeletal:     Cervical back: Normal range of motion.     Right lower leg: Edema present.     Left lower leg: Edema present.     Comments: Lymphedema with chronic hyperpigmentation/woody appearance noted of the bilateral lower extremities  Skin:    General: Skin is warm and dry.     Nails: There is no clubbing.  Neurological:     Mental Status: She is alert and oriented to person, place, and time.  Psychiatric:        Speech: Speech normal.        Behavior: Behavior normal.        Thought Content: Thought content  normal.        Judgment: Judgment normal.     Wt Readings from Last 3 Encounters:  07/21/20 (!) 308 lb (139.7 kg)  03/20/20 (!) 310 lb (140.6 kg)  12/20/19 (!) 321 lb (145.6 kg)     ASSESSMENT & PLAN:   1. Dyspnea/lower extremity swelling/lymphedema: Her symptoms of dyspnea are likely multifactorial including morbid obesity with significant physical deconditioning coupled with possible prolonged complications from reported COVID-19 pneumonia with ongoing fatigue. Given exertional component cannot exclude some degree of cardiomyopathy or ischemia. Therefore we will proceed with an echo initially. If this is unrevealing we will move forward with a Lexiscan MPI. If there is a new cardiomyopathy would alternatively likely proceed directly with diagnostic R/LHC. This was discussed with her in detail. With regards to her lower extremity swelling this appears to be consistent with chronic lymphedema possibly exacerbated by dependent edema with morbid obesity and potentially Actos. I have recommended she discuss active issues with her PCP. Recommend leg elevation. She has been referred to vascular surgery for further evaluation and management with consideration for lymphedema pumps. She remains on Lasix with stable renal function.  2. Sinus tachycardia: Heart rate is well controlled on metoprolol which will be continued.  3. HTN: Blood pressure is well controlled in the office today.  Continue current medications including furosemide, lisinopril, and metoprolol.  Low-sodium diet recommended.  4. HLD: LDL 109 from 03/2020 with mildly elevated LFTs at that time on pravastatin.  Followed  by PCP.  5. Morbid obesity with physical deconditioning: She does indicate she has lost some weight with attempting to limit her carbs. She also reports her documented weight at her visit in 07/2019 was inaccurate indicating she has not weighed less than 300 pounds in quite some time. Given this information it is difficult  to assess her weight trend as it appears to be improving over the past several months. Recommend continued heart healthy diet and weight loss.   Disposition: F/u with Dr. Mariah Milling or an APP in 1 month.   Medication Adjustments/Labs and Tests Ordered: Current medicines are reviewed at length with the patient today.  Concerns regarding medicines are outlined above. Medication changes, Labs and Tests ordered today are summarized above and listed in the Patient Instructions accessible in Encounters.   Signed, Eula Listen, PA-C 07/21/2020 11:26 AM     [facility] [address] [place], [place] [address] [phone]

## 2020-07-21 ENCOUNTER — Encounter: Payer: Self-pay | Admitting: Physician Assistant

## 2020-07-21 ENCOUNTER — Other Ambulatory Visit: Payer: Self-pay

## 2020-07-21 ENCOUNTER — Ambulatory Visit: Payer: Medicare HMO | Admitting: Physician Assistant

## 2020-07-21 VITALS — BP 120/74 | HR 72 | Ht 66.0 in | Wt 308.0 lb

## 2020-07-21 DIAGNOSIS — I890 Lymphedema, not elsewhere classified: Secondary | ICD-10-CM | POA: Diagnosis not present

## 2020-07-21 DIAGNOSIS — R000 Tachycardia, unspecified: Secondary | ICD-10-CM

## 2020-07-21 DIAGNOSIS — E782 Mixed hyperlipidemia: Secondary | ICD-10-CM | POA: Diagnosis not present

## 2020-07-21 DIAGNOSIS — R0600 Dyspnea, unspecified: Secondary | ICD-10-CM | POA: Diagnosis not present

## 2020-07-21 DIAGNOSIS — R0609 Other forms of dyspnea: Secondary | ICD-10-CM

## 2020-07-21 DIAGNOSIS — I10 Essential (primary) hypertension: Secondary | ICD-10-CM | POA: Diagnosis not present

## 2020-07-21 NOTE — Patient Instructions (Addendum)
Medication Instructions:  Your physician recommends that you continue on your current medications as directed. Please refer to the Current Medication list given to you today.  *If you need a refill on your cardiac medications before your next appointment, please call your pharmacy*   Lab Work: None ordered.   Testing/Procedures:  Your physician has requested that you have an echocardiogram. Echocardiography is a painless test that uses sound waves to create images of your heart. It provides your doctor with information about the size and shape of your heart and how well your heart's chambers and valves are working. This procedure takes approximately one hour. There are no restrictions for this procedure.     Follow-Up: At [facility], you and your health needs are our priority.  As part of our continuing mission to provide you with exceptional heart care, we have created designated Provider Care Teams.  These Care Teams include your primary Cardiologist (physician) and Advanced Practice Providers (APPs -  Physician Assistants and Nurse Practitioners) who all work together to provide you with the care you need, when you need it.  We recommend signing up for the patient portal called "MyChart".  Sign up information is provided on this After Visit Summary.  MyChart is used to connect with patients for Virtual Visits (Telemedicine).  Patients are able to view lab/test results, encounter notes, upcoming appointments, etc.  Non-urgent messages can be sent to your provider as well.   To learn more about what you can do with MyChart, go to [email or web address].    Your next appointment:   1 month(s)  The format for your next appointment:   In Person  Provider:   You may see Julien Nordmann, MD or one of the following Advanced Practice Providers on your designated Care Team:    Nicolasa Ducking, NP  Eula Listen, PA-C  Marisue Ivan, PA-C  Cadence [place], [place]  Gillian Shields,  NP   You have been referred to Vascular Surgery. Their office will be in contact with you to schedule appointment.

## 2020-07-26 ENCOUNTER — Other Ambulatory Visit: Payer: Self-pay | Admitting: Family Medicine

## 2020-08-03 ENCOUNTER — Other Ambulatory Visit: Payer: Self-pay | Admitting: Family Medicine

## 2020-08-05 ENCOUNTER — Emergency Department (HOSPITAL_COMMUNITY)
Admission: EM | Admit: 2020-08-05 | Discharge: 2020-08-05 | Disposition: A | Discharge status: Home / Self Care | Payer: Medicare HMO | Attending: Emergency Medicine | Admitting: Emergency Medicine

## 2020-08-05 ENCOUNTER — Other Ambulatory Visit: Payer: Self-pay

## 2020-08-05 ENCOUNTER — Emergency Department (HOSPITAL_COMMUNITY): Payer: Medicare HMO

## 2020-08-05 ENCOUNTER — Encounter (HOSPITAL_COMMUNITY): Payer: Self-pay

## 2020-08-05 DIAGNOSIS — R6883 Chills (without fever): Secondary | ICD-10-CM | POA: Insufficient documentation

## 2020-08-05 DIAGNOSIS — E1140 Type 2 diabetes mellitus with diabetic neuropathy, unspecified: Secondary | ICD-10-CM | POA: Insufficient documentation

## 2020-08-05 DIAGNOSIS — Z79899 Other long term (current) drug therapy: Secondary | ICD-10-CM | POA: Insufficient documentation

## 2020-08-05 DIAGNOSIS — R42 Dizziness and giddiness: Secondary | ICD-10-CM | POA: Diagnosis not present

## 2020-08-05 DIAGNOSIS — R519 Headache, unspecified: Secondary | ICD-10-CM | POA: Insufficient documentation

## 2020-08-05 DIAGNOSIS — R110 Nausea: Secondary | ICD-10-CM | POA: Insufficient documentation

## 2020-08-05 DIAGNOSIS — Z91040 Latex allergy status: Secondary | ICD-10-CM | POA: Diagnosis not present

## 2020-08-05 DIAGNOSIS — E11319 Type 2 diabetes mellitus with unspecified diabetic retinopathy without macular edema: Secondary | ICD-10-CM | POA: Diagnosis not present

## 2020-08-05 DIAGNOSIS — M542 Cervicalgia: Secondary | ICD-10-CM | POA: Insufficient documentation

## 2020-08-05 DIAGNOSIS — Z7982 Long term (current) use of aspirin: Secondary | ICD-10-CM | POA: Diagnosis not present

## 2020-08-05 DIAGNOSIS — Z7984 Long term (current) use of oral hypoglycemic drugs: Secondary | ICD-10-CM | POA: Insufficient documentation

## 2020-08-05 DIAGNOSIS — I10 Essential (primary) hypertension: Secondary | ICD-10-CM | POA: Diagnosis not present

## 2020-08-05 LAB — CBC
HCT: 45.1 % (ref 36.0–46.0)
Hemoglobin: 14.8 g/dL (ref 12.0–15.0)
MCH: 30.8 pg (ref 26.0–34.0)
MCHC: 32.8 g/dL (ref 30.0–36.0)
MCV: 94 fL (ref 80.0–100.0)
Platelets: 184 10*3/uL (ref 150–400)
RBC: 4.8 MIL/uL (ref 3.87–5.11)
RDW: 13.7 % (ref 11.5–15.5)
WBC: 11.9 10*3/uL — ABNORMAL HIGH (ref 4.0–10.5)
nRBC: 0 % (ref 0.0–0.2)

## 2020-08-05 LAB — BASIC METABOLIC PANEL
Anion gap: 15 (ref 5–15)
BUN: 17 mg/dL (ref 8–23)
CO2: 22 mmol/L (ref 22–32)
Calcium: 9.2 mg/dL (ref 8.9–10.3)
Chloride: 100 mmol/L (ref 98–111)
Creatinine, Ser: 0.87 mg/dL (ref 0.44–1.00)
GFR, Estimated: >60 mL/min (ref >60)
Glucose, Bld: 102 mg/dL — ABNORMAL HIGH (ref 70–99)
Potassium: 4 mmol/L (ref 3.5–5.1)
Sodium: 137 mmol/L (ref 135–145)

## 2020-08-05 MED ORDER — DICLOFENAC EPOLAMINE 1.3 % EX PTCH: 1.0000 | MEDICATED_PATCH | Freq: Two times a day (BID) | CUTANEOUS | 0 refills | Status: DC

## 2020-08-05 MED ORDER — METOCLOPRAMIDE HCL 5 MG/ML IJ SOLN
10.0000 mg | Freq: Once | INTRAMUSCULAR | Status: AC
  Administered 2020-08-05: 10 mg via INTRAVENOUS
  Filled 2020-08-05: qty 2

## 2020-08-05 MED ORDER — DIPHENHYDRAMINE HCL 50 MG/ML IJ SOLN
25.0000 mg | Freq: Once | INTRAMUSCULAR | Status: AC
  Administered 2020-08-05: 25 mg via INTRAVENOUS
  Filled 2020-08-05: qty 1

## 2020-08-05 NOTE — Discharge Instructions (Addendum)
You came to the hospital today to be evaluated for your headache.  Your CT scan did not show any head bleed or acute traumatic injury.  I have given you a prescription for diclofenac patches.  Please apply the patch to your neck to help alleviate neck pain.  If your insurance does not cover this or it is too expensive, please speak with the pharmacist as there are over-the-counter gels that can be used as well.  Please follow-up with your primary care provider as needed if her symptoms persist.    Today you received medications that may make you sleepy or impair your ability to make decisions.  For the next 24 hours please do not drive, operate heavy machinery, care for a small child with out another adult present, or perform any activities that may cause harm to you or someone else if you were to fall asleep or be impaired.   Get help right away if: Your headache becomes severe quickly. Your headache gets worse after moderate to intense physical activity. You have repeated vomiting. You have a stiff neck. You have a loss of vision. You have problems with speech. You have pain in the eye or ear. You have muscular weakness or loss of muscle control. You lose your balance or have trouble walking. You feel faint or pass out. You have confusion. You have a seizure.

## 2020-08-05 NOTE — ED Provider Notes (Signed)
[facility]-EMERGENCY DEPT Provider Note   CSN: [id number] Arrival date & time: 08/05/20  1304     History Chief Complaint  Patient presents with  . Neck Pain  . Headache    Linda Briggs is a 64 y.o. female history of diabetes, hypertension, hyperlipidemia, and bulging disc.  Patient presents to the emergency department with a chief complaint of a headache.  Patient reports that her headache began suddenly this morning 02:30.  Patient reports that she rolled over and bed, helped her " whole body cramp up," and then developed a headache, neck pain and left trapezius pain.  Patient stated the pain originally was 9/10, constant, and worse with movement of her head or neck.  Patient states that she felt like her "head was exploding."  Patient reports improvement in her headache, now rating it as 6/10 on pain scale.  Patient reports that she took ibuprofen around noon.  Patient also endorses nausea, lightheadedness, and chills.  Patient patient reports peripheral neuropathy and reports no new numbness or tingling in her extremities.  Patient denies any syncopal episodes or seizures.  Patient denies any traumatic injuries or falls.  HPI     Past Medical History:  Diagnosis Date  . Arthritis    feet & toes  . Bulging disc   . Diabetes mellitus   . Gastroparesis   . History of hepatitis B   . Hyperlipidemia   . Hypertension   . Neuromuscular disorder (HCC)    in feet and fingers   . Neuropathy    feet  . Nonproliferative diabetic retinopathy (HCC)   . 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 Peripheral vascular disease (HCC)   . Shortness of breath dyspnea    on lopressor   . Sleep apnea    pt had study done but it was incomplete due to blood sugar issues     Patient Active Problem List   Diagnosis Date Noted  . Abnormal barium swallow   . Nonproliferative diabetic retinopathy (HCC)   . Paroxysmal tachycardia (HCC) 01/07/2018  . Morbid obesity (HCC)  01/13/2017  . Pure hypercholesterolemia 01/11/2017  . Localized edema 01/11/2017  . Diabetic retinopathy (HCC) 05/29/2016  . Abdominal pain 03/28/2014  . Type 2 diabetes mellitus with complications (HCC) 03/28/2014  . Weakness 07/26/2013  . Hyperlipidemia   . Essential hypertension   . Shortness of breath 03/27/2012  . Edema 03/27/2012  . Near syncope 03/27/2012  . Palpitations 03/27/2012    Past Surgical History:  Procedure Laterality Date  . BIOPSY  04/28/2018   Procedure: BIOPSY;  Surgeon: Meryl DareStark, Malcolm T, MD;  [facility];  Service: Endoscopy;;  . BIOPSY THYROID     january 2016  . ESOPHAGOGASTRODUODENOSCOPY (EGD) WITH PROPOFOL N/A 04/28/2018   Procedure: ESOPHAGOGASTRODUODENOSCOPY (EGD) WITH PROPOFOL;  Surgeon: Meryl DareStark, Malcolm T, MD;  Location: [facility];  Service: Endoscopy;  Laterality: N/A;  . HYSTEROSCOPY WITH D & C N/A 02/15/2015   Procedure: DILATATION AND CURETTAGE /HYSTEROSCOPY;  Surgeon: Mitchel HonourMegan Morris, DO;  Location: [facility];  Service: Gynecology;  Laterality: N/A;  . TONSILLECTOMY       OB History   No obstetric history on file.     Family History  Problem Relation Age of Onset  . Hyperlipidemia Mother   . Heart disease Father   . Hyperlipidemia Father   . Hypertension Father   . Heart disease Brother   . Stroke Brother   . Kidney disease Sister   .  Kidney cancer Sister   . Ovarian cancer Maternal Aunt   . Kidney disease Maternal Aunt        x 2  . Kidney disease Maternal Uncle        x 2  . Colon cancer Neg Hx   . Stomach cancer Neg Hx     Social History   Tobacco Use  . Smoking status: Never Smoker  . Smokeless tobacco: Never Used  Vaping Use  . Vaping Use: Never used  Substance Use Topics  . Alcohol use: No    Comment: social drinker.  . Drug use: No    Home Medications Prior to Admission medications   Medication Sig Start Date End Date Taking? Authorizing Provider  Alcohol Swabs (B-D SINGLE USE SWABS REGULAR) PADS Use to  monitor FSBS 3x daily for fluctuating FSBS. DX: 250.00 02/29/20   Donita Brooks, MD  aspirin EC 81 MG tablet Take 81 mg by mouth daily.    [provider]  Blood Glucose Calibration (TRUE METRIX LEVEL 2) Normal SOLN Use as directed to monitor FSBS 3x daily. Dx: E11.65 02/10/18   Salley Scarlet, MD  Blood Glucose Monitoring Suppl (TRUE METRIX AIR GLUCOSE METER) DEVI 1 each by Does not apply route daily. Use as directed to monitor FSBS 3x daily. Dx: E11.65 02/08/20   Donita Brooks, MD  diclofenac (FLECTOR) 1.3 % PTCH Place 1 patch onto the skin 2 (two) times daily. 08/05/20   Haskel Schroeder, PA-C  FARXIGA 10 MG TABS tablet TAKE 1 TABLET EVERY DAY BEFORE BREAKFAST 06/05/20   Donita Brooks, MD  furosemide (LASIX) 20 MG tablet TAKE 1 TABLET EVERY DAY 07/31/20   Donita Brooks, MD  gabapentin (NEURONTIN) 300 MG capsule TAKE 1 CAPSULE THREE TIMES DAILY 02/04/20   Donita Brooks, MD  glipiZIDE (GLUCOTROL XL) 5 MG 24 hr tablet TAKE 1 TABLET  DAILY WITH BREAKFAST. 07/04/20   Donita Brooks, MD  glucose blood (TRUE METRIX BLOOD GLUCOSE TEST) test strip Use as directed to monitor FSBS 3x daily. Dx: E11.65 02/08/20   Donita Brooks, MD  lisinopril (ZESTRIL) 5 MG tablet Take 1 tablet (5 mg total) by mouth daily. 07/07/20   Donita Brooks, MD  metFORMIN (GLUCOPHAGE) 1000 MG tablet TAKE 1 TABLET TWICE DAILY WITH A MEAL 06/16/20   Blackshear, Velna Hatchet, MD  metoprolol tartrate (LOPRESSOR) 50 MG tablet Take 0.5 tablet (25MG ) by mouth Twice a day    [provider]  Multiple Vitamin (MULTIVITAMIN) tablet Take 1 tablet by mouth daily.    [provider]  ondansetron (ZOFRAN) 4 MG tablet Take 4 mg by mouth every 8 (eight) hours as needed for nausea or vomiting.    [provider]  pantoprazole (PROTONIX) 40 MG tablet Take 1 tablet (40 mg total) by mouth daily. 07/11/20   13/9/21, MD  pioglitazone (ACTOS) 30 MG tablet TAKE 1 TABLET EVERY DAY 07/31/20    08/02/20, MD  potassium chloride (KLOR-CON) 10 MEQ tablet TAKE 1 TABLET EVERY DAY 02/18/20   02/20/20, MD  pravastatin (PRAVACHOL) 40 MG tablet TAKE 1 TABLET EVERY DAY 01/14/20   01/16/20, MD  traMADol (ULTRAM) 50 MG tablet TAKE 1 TABLET BY MOUTH EVERY 6 HOURS AS NEEDED FOR  CHRONIC  BACK  PAIN 10/20/18   10/22/18, MD  TRUEplus Lancets 30G MISC Use as directed to monitor FSBS 3x daily. Dx: E11.65 02/29/20   Pickard,  Priscille Heidelberg, MD  TRULICITY 1.5 MG/0.5ML SOPN INJECT 1.5MG  (1 PEN) SUBCUTANEOUSLY EVERY WEEK AS DIRECTED 08/04/20   Donita Brooks, MD    Allergies    Codeine, Penicillins, Ciprofloxacin, Food, Januvia [sitagliptin], and Latex  Review of Systems   Review of Systems  Constitutional: Positive for chills. Negative for fever.  Eyes: Negative for photophobia and visual disturbance.  Respiratory: Negative for shortness of breath (pt reports SOB at baseline since having Covid).   Cardiovascular: Negative for chest pain.  Gastrointestinal: Positive for nausea. Negative for abdominal pain and vomiting.  Genitourinary: Negative for difficulty urinating and dysuria.  Musculoskeletal: Positive for neck pain. Negative for back pain and neck stiffness.  Skin: Negative for color change and rash.  Neurological: Positive for light-headedness and headaches. Negative for dizziness, tremors, seizures, syncope, facial asymmetry, speech difficulty, weakness and numbness.  Psychiatric/Behavioral: Negative for confusion.    Physical Exam Updated Vital Signs BP (!) 141/84 (BP Location: Right Arm)   Pulse 86   Temp 98.5 F (36.9 C)   Resp 19   SpO2 96%   Physical Exam Constitutional:      General: She is not in acute distress.    Appearance: She is obese. She is not ill-appearing, toxic-appearing or diaphoretic.  HENT:     Head: Normocephalic.  Eyes:     General: Vision grossly intact.     Extraocular Movements: Extraocular movements intact.     Pupils:  Pupils are equal, round, and reactive to light.  Cardiovascular:     Rate and Rhythm: Normal rate and regular rhythm.     Heart sounds: Normal heart sounds.  Pulmonary:     Effort: Pulmonary effort is normal.     Breath sounds: Normal breath sounds.  Musculoskeletal:     Cervical back: Neck supple. No rigidity. No pain with movement, spinous process tenderness or muscular tenderness.     Thoracic back: No tenderness or bony tenderness.     Lumbar back: No tenderness or bony tenderness.  Skin:    General: Skin is warm and dry.  Neurological:     General: No focal deficit present.     Mental Status: She is alert.     GCS: GCS eye subscore is 4. GCS verbal subscore is 5. GCS motor subscore is 6.     Cranial Nerves: No cranial nerve deficit or facial asymmetry.     Motor: No weakness, tremor or seizure activity.  Psychiatric:        Behavior: Behavior is cooperative.     ED Results / Procedures / Treatments   Labs (all labs ordered are listed, but only abnormal results are displayed) Labs Reviewed  BASIC METABOLIC PANEL - Abnormal; Notable for the following components:      Result Value   Glucose, Bld 102 (*)    All other components within normal limits  CBC - Abnormal; Notable for the following components:   WBC 11.9 (*)    All other components within normal limits    EKG None  Radiology CT Head Wo Contrast  Result Date: 08/05/2020 CLINICAL DATA:  Headache since last night. EXAM: CT HEAD WITHOUT CONTRAST TECHNIQUE: Contiguous axial images were obtained from the base of the skull through the vertex without intravenous contrast. COMPARISON:  None. FINDINGS: Brain: There is no evidence for acute hemorrhage, hydrocephalus, mass lesion, or abnormal extra-axial fluid collection. No definite CT evidence for acute infarction. Vascular: No hyperdense vessel or unexpected calcification. Skull: No evidence for fracture. No worrisome  lytic or sclerotic lesion. Sinuses/Orbits: The  visualized paranasal sinuses and mastoid air cells are clear. Visualized portions of the globes and intraorbital fat are unremarkable. Other: None. IMPRESSION: Unremarkable exam.  No acute intracranial abnormality. Electronically Signed   By: Kennith Center M.D.   On: 08/05/2020 19:25    Procedures Procedures (including critical care time)  Medications Ordered in ED Medications  metoCLOPramide (REGLAN) injection 10 mg (10 mg Intravenous Given 08/05/20 1958)  diphenhydrAMINE (BENADRYL) injection 25 mg (25 mg Intravenous Given 08/05/20 1959)    ED Course  I have reviewed the triage vital signs and the nursing notes.  Pertinent labs & imaging results that were available during my care of the patient were reviewed by me and considered in my medical decision making (see chart for details).    MDM Rules/Calculators/A&P                          Alert 64 year old female in no acute distress with a history of  diabetes, hypertension, hyperlipidemia, and bulging disc.  Patient presents with a chief complaint of sudden onset of headache and neck pain that started this morning at 02:30 after rolling over in bed.  Patient reports headache onset was sudden, felt like her "head was exploding," 9/10 on the pain scale, worse with movement of head or neck.  Patient indicates that her pain was located in the occipital region and across her forehead.  Patient endorses chills, nausea, and lightheadedness.  Patient denies any syncope, falls, or recent traumatic injuries.  Patient reports her brother had to aneurysms diagnosed in his 13s.  At present patient reports her headache is currently 6 out of 10 on the pain scale and denies any neck pain or nausea.  Patient has no focal deficits on his exam, no weakness, pupils are PERRL, no tenderness to cervical, thoracic, lumbar spinous process.    Due to patient's sudden onset of severe headache, family history of aneurysm, a Noncontrast head CT was ordered to evaluate for  any cranial hemorrhage.  BMP and CBC pending.    CBC shows slightly elevated white count at 11.9, BMP was unremarkable.  CAT scan showed no acute intracranial abnormality.  Patient received Reglan and Benadryl for her headache.  She reports improvement in her pain.  Patient was given prescription for diclofenac patch.  Patient was given information to follow up with PCP if symptoms persist.  Patient was given strict return precautions.  Patient expressed understanding of all instructions.    Final Clinical Impression(s) / ED Diagnoses Final diagnoses:  Acute nonintractable headache, unspecified headache type    Rx / DC Orders ED Discharge Orders         Ordered    diclofenac (FLECTOR) 1.3 % PTCH  2 times daily        08/05/20 2104           Haskel Schroeder, PA-C 08/05/20 2259    Gerhard Munch, MD 08/05/20 [phone]

## 2020-08-05 NOTE — ED Triage Notes (Signed)
Pt presents with c/o neck pain and a headache since 2:30 this am. Pt denies any recent injuries or falls. Pt reports she has taken some ibuprofen with minimal relief. Pt also c/o chills and some nausea.

## 2020-08-11 ENCOUNTER — Ambulatory Visit (INDEPENDENT_AMBULATORY_CARE_PROVIDER_SITE_OTHER): Payer: Medicare HMO

## 2020-08-11 ENCOUNTER — Other Ambulatory Visit: Payer: Self-pay

## 2020-08-11 ENCOUNTER — Ambulatory Visit: Payer: Medicare HMO | Admitting: Podiatry

## 2020-08-11 DIAGNOSIS — E11621 Type 2 diabetes mellitus with foot ulcer: Secondary | ICD-10-CM

## 2020-08-11 DIAGNOSIS — L97512 Non-pressure chronic ulcer of other part of right foot with fat layer exposed: Secondary | ICD-10-CM

## 2020-08-11 DIAGNOSIS — L03031 Cellulitis of right toe: Secondary | ICD-10-CM

## 2020-08-11 DIAGNOSIS — E1142 Type 2 diabetes mellitus with diabetic polyneuropathy: Secondary | ICD-10-CM | POA: Diagnosis not present

## 2020-08-11 DIAGNOSIS — L02611 Cutaneous abscess of right foot: Secondary | ICD-10-CM | POA: Diagnosis not present

## 2020-08-11 DIAGNOSIS — M2041 Other hammer toe(s) (acquired), right foot: Secondary | ICD-10-CM | POA: Diagnosis not present

## 2020-08-11 DIAGNOSIS — E11622 Type 2 diabetes mellitus with other skin ulcer: Secondary | ICD-10-CM

## 2020-08-11 MED ORDER — SULFAMETHOXAZOLE-TRIMETHOPRIM 800-160 MG PO TABS: 1.0000 | ORAL_TABLET | Freq: Two times a day (BID) | ORAL | 1 refills | Status: DC

## 2020-08-11 MED ORDER — MUPIROCIN 2 % EX OINT: 1.0000 "application " | TOPICAL_OINTMENT | Freq: Two times a day (BID) | CUTANEOUS | 2 refills | Status: DC

## 2020-08-14 NOTE — Progress Notes (Signed)
  Subjective:  Patient ID: Linda Briggs, female    DOB: [birth year],  MRN: [id number]  Chief Complaint  Patient presents with  . Blister    Right 5th digit- pt states she noticed blister a few weeks ago- mentioned when blister bursted there was drainage/reddness/swelling-     64 y.o. female presents with the above complaint. History confirmed with patient.  She was concerned because she is diabetic.  She has an appointment with vascular doctor next Friday she has a history of type 2 diabetes.  Her last A1c was about 7.2%.  She is due to have it checked again next month.  Objective:  Physical Exam: warm, good capillary refill, no trophic changes or ulcerative lesions and normal DP and PT pulses.  Abnormal monofilament exam with loss of protective sensation  Right Foot: There is a full-thickness ulceration over the dorsal lateral PIPJ of the fifth toe measuring 1.7 cm in width and 0.6 cm in length and 0.2 cm in depth.  Mild cellulitis of the fourth and fifth toes to the level of the MTPJ.  No gross purulence.  No exposed bone tendon or capsule.    Radiographs: X-ray of the right foot: no fracture, dislocation, swelling or degenerative changes noted and no soft tissue emphysema, no signs of osteomyelitis Assessment:   1. 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  Patient educated on diabetes. Discussed proper diabetic foot care and discussed risks and complications of disease. Educated patient in depth on reasons to return to the office immediately should he/she discover anything concerning or new on the feet. All questions answered. Discussed proper shoes as well.   Ulcer right fifth toe -Debridement as below. -10 days Bactrim sent to pharmacy for cellulitis.  No current evidence of osteomyelitis clinically radiographically -Dressed with Iodosorb, DSD. -Continue off-loading with surgical  shoe. -Surgical shoe was dispensed today  Procedure: Excisional Debridement of Wound Rationale: Removal of non-viable soft tissue from the wound to promote healing.  Anesthesia: none Pre-Debridement Wound Measurements: 1.7 cm x 0.6 cm x 0.2 cm  Post-Debridement Wound Measurements: 1.8 cm x 0.8 cm x 0.3 cm  Type of Debridement: Sharp Excisional Tissue Removed: Non-viable soft tissue Depth of Debridement: subcutaneous tissue. Technique: Sharp excisional debridement to bleeding, viable wound base.  Dressing: Dry, sterile, compression dressing. Disposition: Patient tolerated procedure well.        Return in about 2 weeks (around 08/25/2020).

## 2020-08-16 ENCOUNTER — Other Ambulatory Visit: Payer: Self-pay

## 2020-08-16 ENCOUNTER — Ambulatory Visit (INDEPENDENT_AMBULATORY_CARE_PROVIDER_SITE_OTHER): Payer: Medicare HMO

## 2020-08-16 DIAGNOSIS — R0600 Dyspnea, unspecified: Secondary | ICD-10-CM

## 2020-08-16 DIAGNOSIS — R0609 Other forms of dyspnea: Secondary | ICD-10-CM

## 2020-08-16 LAB — ECHOCARDIOGRAM COMPLETE
AR max vel: 2.19 cm2
AV Area VTI: 1.89 cm2
AV Area mean vel: 1.92 cm2
AV Mean grad: 8 mmHg
AV Peak grad: 12.7 mmHg
Ao pk vel: 1.79 m/s
Area-P 1/2: 4.24 cm2

## 2020-08-16 MED ORDER — PERFLUTREN LIPID MICROSPHERE
1.0000 mL | INTRAVENOUS | Status: AC | PRN
  Administered 2020-08-16: 2 mL via INTRAVENOUS

## 2020-08-17 ENCOUNTER — Telehealth: Payer: Self-pay | Admitting: *Deleted

## 2020-08-17 DIAGNOSIS — R0609 Other forms of dyspnea: Secondary | ICD-10-CM

## 2020-08-17 NOTE — Telephone Encounter (Signed)
-----   Message from Sondra Barges, PA-C sent at 08/17/2020  7:08 AM EST ----- Echo showed normal pump function, normal wall motion, slightly stiffened heart, normal pressure in the right side of the heart, small pericardial effusion, and no significant valvular abnormalities.  As discussed at her office visit, please schedule patient for Lexiscan MPI for dyspnea.  Following this, she should follow-up with her primary cardiologist as scheduled next month

## 2020-08-17 NOTE — Telephone Encounter (Signed)
Spoke to pt, notified of echo results in message below. Pt needs Lexiscan Myoview scheduled at [facility] for dyspnea per Eula Listen, PA.  Pt currently has follow up with Dr. Mariah Milling 09/05/20. Follow up needs to be after Myoview w/ Dr. Mariah Milling.   I reviewed pt instructions below with pt. Pt verbalized understanding. Forwarding to schedueling to have scheduled. Pt aware to receive call back with date/time. Advised pt to call with any questions regarding instructions in the meantime.   [facility] MYOVIEW  Your caregiver has ordered a Stress Test with nuclear imaging. The purpose of this test is to evaluate the blood supply to your heart muscle. This procedure is referred to as a "Non-Invasive Stress Test." This is because other than having an IV started in your vein, nothing is inserted or "invades" your body. Cardiac stress tests are done to find areas of poor blood flow to the heart by determining the extent of coronary artery disease (CAD). Some patients exercise on a treadmill, which naturally increases the blood flow to your heart, while others who are  unable to walk on a treadmill due to physical limitations have a pharmacologic/chemical stress agent called Lexiscan . This medicine will mimic walking on a treadmill by temporarily increasing your coronary blood flow.   Please note: these test may take anywhere between 2-4 hours to complete  PLEASE REPORT TO [facility] ENTRANCE  THE VOLUNTEERS AT THE FIRST DESK WILL DIRECT YOU WHERE TO GO  Date of Procedure:  Arrival Time for Procedure:  Instructions regarding medication:   __X__ : Hold diabetes medication morning of procedure              >>Glipizide, Metformin, Pioglitazone (Actos), Farxega   __X__:  Hold other medications as follows:               >>Furosemide morning of procedure   PLEASE NOTIFY THE OFFICE AT LEAST 24 HOURS IN ADVANCE IF YOU ARE UNABLE TO KEEP YOUR APPOINTMENT.  [phone] AND  PLEASE NOTIFY NUCLEAR MEDICINE AT [facility] AT  LEAST 24 HOURS IN ADVANCE IF YOU ARE UNABLE TO KEEP YOUR APPOINTMENT. [phone]  How to prepare for your Myoview test:  1. Do not eat or drink after midnight 2. No caffeine for 24 hours prior to test 3. No smoking 24 hours prior to test. 4. Your medication may be taken with water.  If your doctor stopped a medication because of this test, do not take that medication. 5. Ladies, please do not wear dresses.  Skirts or pants are appropriate. Please wear a short sleeve shirt. 6. No perfume, cologne or lotion. 7. Wear comfortable walking shoes. No heels!

## 2020-08-18 ENCOUNTER — Other Ambulatory Visit: Payer: Self-pay

## 2020-08-18 ENCOUNTER — Ambulatory Visit (INDEPENDENT_AMBULATORY_CARE_PROVIDER_SITE_OTHER): Payer: Medicare HMO | Admitting: Vascular Surgery

## 2020-08-18 VITALS — BP 127/84 | HR 66 | Ht 66.0 in | Wt 306.0 lb

## 2020-08-18 DIAGNOSIS — I10 Essential (primary) hypertension: Secondary | ICD-10-CM

## 2020-08-18 DIAGNOSIS — R609 Edema, unspecified: Secondary | ICD-10-CM | POA: Diagnosis not present

## 2020-08-18 DIAGNOSIS — L97512 Non-pressure chronic ulcer of other part of right foot with fat layer exposed: Secondary | ICD-10-CM | POA: Diagnosis not present

## 2020-08-18 DIAGNOSIS — E7800 Pure hypercholesterolemia, unspecified: Secondary | ICD-10-CM | POA: Diagnosis not present

## 2020-08-18 DIAGNOSIS — E118 Type 2 diabetes mellitus with unspecified complications: Secondary | ICD-10-CM

## 2020-08-18 NOTE — Assessment & Plan Note (Signed)
blood pressure control important in reducing the progression of atherosclerotic disease. On appropriate oral medications.  

## 2020-08-18 NOTE — Assessment & Plan Note (Signed)
blood glucose control important in reducing the progression of atherosclerotic disease. Also, involved in wound healing. On appropriate medications.  

## 2020-08-18 NOTE — Assessment & Plan Note (Signed)
The patient has multiple atherosclerotic risk factors and a nonhealing ulceration on the right foot with nonpalpable pedal pulses.  Arterial assessment be done with noninvasive studies in the near future at her convenience.  This is potentially a limb threatening situation.

## 2020-08-18 NOTE — Assessment & Plan Note (Signed)

## 2020-08-18 NOTE — Assessment & Plan Note (Signed)
lipid control important in reducing the progression of atherosclerotic disease. Continue statin therapy  

## 2020-08-18 NOTE — Progress Notes (Signed)
Patient ID: Linda Briggs, female   DOB: [birth year], 64 y.o.   MRN: [id number]  Chief Complaint  Patient presents with  . New Patient (Initial Visit)    lymphedema    HPI Linda Briggs is a 64 y.o. female.  I am asked to see the patient by R. Dunn for evaluation of leg swelling.  The patient reports she has had problems with leg swelling since she was a teenager.  She is always had swollen legs.  This has exacerbated over the last several years that she has gained more weight, become much more immobile due to breathing and back issues, and generally become more sedentary.  Both legs are quite swollen but the left is the more severely affected of the 2.  She has had many episodes where she has had weeping with fluid drainage and sometimes bloody drainage from her legs.  No known history of DVT or superficial thrombophlebitis to her knowledge.  Also, she has a nonhealing ulceration on her right foot.  This is being managed by podiatry.  She has longstanding diabetes.  She has not had an arterial assessment that I can see.  She walks only short distances due to pain and heaviness in her legs as well as shortness of breath and back pain.  No fever or chills.  She did notice that her leg swelling got worse after she was started on Actos.  She has now stopped that.   Past Medical History:  Diagnosis Date  . Arthritis    feet & toes  . Bulging disc   . Diabetes mellitus   . Gastroparesis   . History of hepatitis B   . Hyperlipidemia   . Hypertension   . Neuromuscular disorder (HCC)    in feet and fingers   . Neuropathy    feet  . Nonproliferative diabetic retinopathy (HCC)   . 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 Peripheral vascular disease (HCC)   . Shortness of breath dyspnea    on lopressor   . Sleep apnea    pt had study done but it was incomplete due to blood sugar issues     Past Surgical History:  Procedure Laterality Date  . BIOPSY  04/28/2018   Procedure: BIOPSY;   Surgeon: Meryl Dare, MD;  Location: [facility];  Service: Endoscopy;;  . BIOPSY THYROID     january 2016  . ESOPHAGOGASTRODUODENOSCOPY (EGD) WITH PROPOFOL N/A 04/28/2018   Procedure: ESOPHAGOGASTRODUODENOSCOPY (EGD) WITH PROPOFOL;  Surgeon: Meryl Dare, MD;  Location: [facility];  Service: Endoscopy;  Laterality: N/A;  . HYSTEROSCOPY WITH D & C N/A 02/15/2015   Procedure: DILATATION AND CURETTAGE /HYSTEROSCOPY;  Surgeon: Mitchel Honour, DO;  Location: [facility];  Service: Gynecology;  Laterality: N/A;  . TONSILLECTOMY       Family History  Problem Relation Age of Onset  . Hyperlipidemia Mother   . Heart disease Father   . Hyperlipidemia Father   . Hypertension Father   . Heart disease Brother   . Stroke Brother   . Kidney disease Sister   . Kidney cancer Sister   . Ovarian cancer Maternal Aunt   . Kidney disease Maternal Aunt        x 2  . Kidney disease Maternal Uncle        x 2  . Colon cancer Neg Hx   . Stomach cancer Neg Hx       Social History  Tobacco Use  . Smoking status: Never Smoker  . Smokeless tobacco: Never Used  Vaping Use  . Vaping Use: Never used  Substance Use Topics  . Alcohol use: No    Comment: social drinker.  . Drug use: No     Allergies  Allergen Reactions  . Codeine Anaphylaxis and Hives  . Penicillins Anaphylaxis, Hives and Other (See Comments)    Has patient had a PCN reaction causing immediate rash, facial/tongue/throat swelling, SOB or lightheadedness with hypotension: No Has patient had a PCN reaction causing severe rash involving mucus membranes or skin necrosis: No Has patient had a PCN reaction that required hospitalization: No Has patient had a PCN reaction occurring within the last 10 years: No If all of the above answers are "NO", then may proceed with Cephalosporin use.   . Ciprofloxacin Other (See Comments)    Tongue Swelling  . Food Nausea And Vomiting and Other (See Comments)    Olives   . Januvia  [Sitagliptin] Other (See Comments)    Stomach cramps  . Latex Hives    Current Outpatient Medications  Medication Sig Dispense Refill  . Alcohol Swabs (B-D SINGLE USE SWABS REGULAR) PADS Use to monitor FSBS 3x daily for fluctuating FSBS. DX: 250.00 300 each 1  . aspirin EC 81 MG tablet Take 81 mg by mouth daily.    . Blood Glucose Calibration (TRUE METRIX LEVEL 2) Normal SOLN Use as directed to monitor FSBS 3x daily. Dx: E11.65 1 each 1  . Blood Glucose Monitoring Suppl (TRUE METRIX AIR GLUCOSE METER) DEVI 1 each by Does not apply route daily. Use as directed to monitor FSBS 3x daily. Dx: E11.65 1 each 5  . FARXIGA 10 MG TABS tablet TAKE 1 TABLET EVERY DAY BEFORE BREAKFAST 90 tablet 3  . furosemide (LASIX) 20 MG tablet TAKE 1 TABLET EVERY DAY 90 tablet 0  . gabapentin (NEURONTIN) 300 MG capsule TAKE 1 CAPSULE THREE TIMES DAILY 270 capsule 1  . glipiZIDE (GLUCOTROL XL) 5 MG 24 hr tablet TAKE 1 TABLET  DAILY WITH BREAKFAST. 90 tablet 1  . glucose blood (TRUE METRIX BLOOD GLUCOSE TEST) test strip Use as directed to monitor FSBS 3x daily. Dx: E11.65 300 each 3  . lisinopril (ZESTRIL) 5 MG tablet Take 1 tablet (5 mg total) by mouth daily. 90 tablet 3  . metFORMIN (GLUCOPHAGE) 1000 MG tablet TAKE 1 TABLET TWICE DAILY WITH A MEAL 180 tablet 1  . metoprolol tartrate (LOPRESSOR) 50 MG tablet Take 0.5 tablet ( ) by mouth Twice a day    . Multiple Vitamin (MULTIVITAMIN) tablet Take 1 tablet by mouth daily.    . mupirocin ointment (BACTROBAN) 2 % Apply 1 application topically 2 (two) times daily. 30 g 2  . ondansetron (ZOFRAN) 4 MG tablet Take 4 mg by mouth every 8 (eight) hours as needed for nausea or vomiting.    . pantoprazole (PROTONIX) 40 MG tablet Take 1 tablet (40 mg total) by mouth daily. 60 tablet 2  . potassium chloride (KLOR-CON) 10 MEQ tablet TAKE 1 TABLET EVERY DAY 90 tablet 1  . pravastatin (PRAVACHOL) 40 MG tablet TAKE 1 TABLET EVERY DAY 90 tablet 3  . sulfamethoxazole-trimethoprim  (BACTRIM DS) 800-160 MG tablet Take 1 tablet by mouth 2 (two) times daily. 20 tablet 1  . traMADol (ULTRAM) 50 MG tablet TAKE 1 TABLET BY MOUTH EVERY 6 HOURS AS NEEDED FOR  CHRONIC  BACK  PAIN 60 tablet 0  . TRUEplus Lancets 30G MISC Use as  directed to monitor FSBS 3x daily. Dx: E11.65 200 each 3  . TRULICITY 1.5 MG/0.5ML SOPN INJECT 1.5MG  (1 PEN) SUBCUTANEOUSLY EVERY WEEK AS DIRECTED 12 mL 1  . diclofenac (FLECTOR) 1.3 % PTCH Place 1 patch onto the skin 2 (two) times daily. (Patient not taking: Reported on 08/18/2020) 60 patch 0  . pioglitazone (ACTOS) 30 MG tablet TAKE 1 TABLET EVERY DAY (Patient not taking: Reported on 08/18/2020) 90 tablet 1   No current facility-administered medications for this visit.      REVIEW OF SYSTEMS (Negative unless checked)  Constitutional: [] Weight loss  [] Fever  [] Chills Cardiac: [] Chest pain   [] Chest pressure   [] Palpitations   [] Shortness of breath when laying flat   [x] Shortness of breath at rest   [x] Shortness of breath with exertion. Vascular:  [] Pain in legs with walking   [] Pain in legs at rest   [] Pain in legs when laying flat   [] Claudication   [] Pain in feet when walking  [] Pain in feet at rest  [] Pain in feet when laying flat   [] History of DVT   [] Phlebitis   [x] Swelling in legs   [] Varicose veins   [] Non-healing ulcers Pulmonary:   [] Uses home oxygen   [] Productive cough   [] Hemoptysis   [] Wheeze  [] COPD   [] Asthma Neurologic:  [] Dizziness  [] Blackouts   [] Seizures   [] History of stroke   [] History of TIA  [] Aphasia   [] Temporary blindness   [] Dysphagia   [] Weakness or numbness in arms   [] Weakness or numbness in legs Musculoskeletal:  [x] Arthritis   [] Joint swelling   [] Joint pain   [x] Low back pain Hematologic:  [] Easy bruising  [] Easy bleeding   [] Hypercoagulable state   [] Anemic  [] Hepatitis Gastrointestinal:  [] Blood in stool   [] Vomiting blood  [] Gastroesophageal reflux/heartburn   [] Abdominal pain Genitourinary:  [] Chronic kidney disease    [] Difficult urination  [] Frequent urination  [] Burning with urination   [] Hematuria Skin:  [] Rashes   [] Ulcers   [] Wounds Psychological:  [] History of anxiety   []  History of major depression.    Physical Exam BP 127/84   Pulse 66   Ht 5\' 6"  (1.676 m)   Wt (!) 306 lb (138.8 kg)   BMI 49.39 kg/m  Gen:  WD/WN, NAD.  Obese Head: [place]/AT, No temporalis wasting. Ear/Nose/Throat: Hearing grossly intact, nares w/o erythema or drainage, oropharynx w/o Erythema/Exudate Eyes: Conjunctiva clear, sclera non-icteric  Neck: trachea midline.  No JVD.  Pulmonary:  Good air movement, respirations not labored, no use of accessory muscles  Cardiac: Somewhat irregular Vascular:  Vessel Right Left  Radial Palpable Palpable                          DP  not palpable  not palpable  PT  not palpable  not palpable   Gastrointestinal:. No masses, surgical incisions, or scars. Musculoskeletal: M/S 5/5 throughout.  Extremities without ischemic changes.  No deformity or atrophy.  Severe stasis dermatitis changes are present bilaterally with thick woody induration of the skin bilaterally.  2+ right lower extremity edema, 2-3+ left lower extremity edema. Neurologic: Sensation grossly intact in extremities.  Symmetrical.  Speech is fluent. Motor exam as listed above. Psychiatric: Judgment intact, Mood & affect appropriate for pt's clinical situation. Dermatologic: No rashes or ulcers noted.  Severe stasis dermatitis changes as described above    Radiology CT Head Wo Contrast  Result Date: 08/05/2020 CLINICAL DATA:  Headache since last night. EXAM: CT  HEAD WITHOUT CONTRAST TECHNIQUE: Contiguous axial images were obtained from the base of the skull through the vertex without intravenous contrast. COMPARISON:  None. FINDINGS: Brain: There is no evidence for acute hemorrhage, hydrocephalus, mass lesion, or abnormal extra-axial fluid collection. No definite CT evidence for acute infarction. Vascular: No  hyperdense vessel or unexpected calcification. Skull: No evidence for fracture. No worrisome lytic or sclerotic lesion. Sinuses/Orbits: The visualized paranasal sinuses and mastoid air cells are clear. Visualized portions of the globes and intraorbital fat are unremarkable. Other: None. IMPRESSION: Unremarkable exam.  No acute intracranial abnormality. Electronically Signed   By: Kennith Center M.D.   On: 08/05/2020 19:25   ECHOCARDIOGRAM COMPLETE  Result Date: 08/16/2020    ECHOCARDIOGRAM REPORT   Patient Name:   Linda Briggs Date of Exam: 08/16/2020 Medical Rec #:  [id number]       Height:       66.0 in Accession #:    [id number]      Weight:       308.0 lb Date of Birth:  09-10-1955       BSA:          2.403 m Patient Age:    64 years        BP:           120/74 mmHg Patient Gender: F               HR:           73 bpm. Exam Location:  [place] Procedure: 2D Echo, Cardiac Doppler, Color Doppler and Intracardiac            Opacification Agent Indications:    R06.02 SOB; R55 Syncope  History:        Patient has prior history of Echocardiogram examinations, most                 recent 04/07/2012. Arrythmias:Tachycardia,                 Signs/Symptoms:Shortness of Breath, Syncope and Edema; Risk                 Factors:Diabetes, Dyslipidemia, Hypertension, Non-Smoker and                 Covid 11/2018.  Sonographer:    Quentin Ore RDMS, RVT, RDCS Referring Phys: [id number] Raymon Mutton DUNN IMPRESSIONS  1. Left ventricular ejection fraction, by estimation, is 55 to 60%. The left ventricle has normal function. The left ventricle has no regional wall motion abnormalities. Left ventricular diastolic parameters are consistent with Grade II diastolic dysfunction (pseudonormalization).  2. Right ventricular systolic function is normal. The right ventricular size is normal. There is normal pulmonary artery systolic pressure.  3. The pericardial effusion is posterior and lateral to the left ventricle.  4. The mitral valve is  grossly normal. No evidence of mitral valve regurgitation.  5. The aortic valve was not well visualized. Aortic valve regurgitation is not visualized.  6. The inferior vena cava is normal in size with greater than 50% respiratory variability, suggesting right atrial pressure of 3 mmHg. FINDINGS  Left Ventricle: Left ventricular ejection fraction, by estimation, is 55 to 60%. The left ventricle has normal function. The left ventricle has no regional wall motion abnormalities. The left ventricular internal cavity size was normal in size. There is  no left ventricular hypertrophy. Left ventricular diastolic parameters are consistent with Grade II diastolic dysfunction (pseudonormalization). Right Ventricle: The right ventricular size is normal. Right  vetricular wall thickness was not well visualized. Right ventricular systolic function is normal. There is normal pulmonary artery systolic pressure. The tricuspid regurgitant velocity is 1.99 m/s, and with an assumed right atrial pressure of 3 mmHg, the estimated right ventricular systolic pressure is 18.8 mmHg. Left Atrium: Left atrial size was normal in size. Right Atrium: Right atrial size was normal in size. Pericardium: Trivial pericardial effusion is present. The pericardial effusion is posterior and lateral to the left ventricle. Mitral Valve: The mitral valve is grossly normal. No evidence of mitral valve regurgitation. Tricuspid Valve: The tricuspid valve is grossly normal. Tricuspid valve regurgitation is not demonstrated. Aortic Valve: The aortic valve was not well visualized. Aortic valve regurgitation is not visualized. Aortic valve mean gradient measures 8.0 mmHg. Aortic valve peak gradient measures 12.7 mmHg. Aortic valve area, by VTI measures 1.89 cm. Pulmonic Valve: The pulmonic valve was not well visualized. Pulmonic valve regurgitation is not visualized. Aorta: The aortic root is normal in size and structure. Venous: The inferior vena cava is normal in  size with greater than 50% respiratory variability, suggesting right atrial pressure of 3 mmHg. IAS/Shunts: No atrial level shunt detected by color flow Doppler.  LEFT VENTRICLE PLAX 2D LVOT diam:     2.20 cm  Diastology LV SV:         79       LV e' medial:    5.55 cm/s LV SV Index:   33       LV E/e' medial:  20.4 LVOT Area:     3.80 cm LV e' lateral:   6.31 cm/s                         LV E/e' lateral: 17.9  RIGHT VENTRICLE             IVC RV S prime:     13.90 cm/s  IVC diam: 1.80 cm TAPSE (M-mode): 2.1 cm LEFT ATRIUM             Index LA diam:        4.40 cm 1.83 cm/m LA Vol (A2C):   45.2 ml 18.81 ml/m LA Vol (A4C):   34.8 ml 14.48 ml/m LA Biplane Vol: 39.6 ml 16.48 ml/m  AORTIC VALVE                    PULMONIC VALVE AV Area (Vmax):    2.19 cm     PV Vmax:       0.84 m/s AV Area (Vmean):   1.92 cm     PV Peak grad:  2.8 mmHg AV Area (VTI):     1.89 cm AV Vmax:           178.50 cm/s AV Vmean:          131.000 cm/s AV VTI:            0.418 m AV Peak Grad:      12.7 mmHg AV Mean Grad:      8.0 mmHg LVOT Vmax:         103.00 cm/s LVOT Vmean:        66.200 cm/s LVOT VTI:          0.208 m LVOT/AV VTI ratio: 0.50  AORTA Ao Arch diam: 2.1 cm MITRAL VALVE                TRICUSPID VALVE MV Area (PHT): 4.24 cm  TR Peak grad:   15.8 mmHg MV Decel Time: 179 msec     TR Vmax:        199.00 cm/s MV E velocity: 113.00 cm/s MV A velocity: 102.00 cm/s  SHUNTS MV E/A ratio:  1.11         Systemic VTI:  0.21 m                             Systemic Diam: 2.20 cm Debbe Odea MD Electronically signed by Debbe Odea MD Signature Date/Time: 08/16/2020/5:42:37 PM    Final     Labs Recent Results (from the past 2160 hour(s))  Basic metabolic panel     Status: Abnormal   Collection Time: 08/05/20  5:52 PM  Result Value Ref Range   Sodium 137 135 - 145 mmol/L   Potassium 4.0 3.5 - 5.1 mmol/L   Chloride 100 98 - 111 mmol/L   CO2 22 22 - 32 mmol/L   Glucose, Bld 102 (H) 70 - 99 mg/dL    Comment: Glucose  reference range applies only to samples taken after fasting for at least 8 hours.   BUN 17 8 - 23 mg/dL   Creatinine, Ser 4.09 0.44 - 1.00 mg/dL   Calcium 9.2 8.9 - 81.1 mg/dL   GFR, Estimated >91 >47 mL/min    Comment: (NOTE) Calculated using the CKD-EPI Creatinine Equation (2021)    Anion gap 15 5 - 15    Comment: Performed at [facility], [address], [place], [place] [address]  CBC     Status: Abnormal   Collection Time: 08/05/20  5:52 PM  Result Value Ref Range   WBC 11.9 (H) 4.0 - 10.5 K/uL   RBC 4.80 3.87 - 5.11 MIL/uL   Hemoglobin 14.8 12.0 - 15.0 g/dL   HCT 21.3 08.6 - 57.8 %   MCV 94.0 80.0 - 100.0 fL   MCH 30.8 26.0 - 34.0 pg   MCHC 32.8 30.0 - 36.0 g/dL   RDW 46.9 62.9 - 52.8 %   Platelets 184 150 - 400 K/uL   nRBC 0.0 0.0 - 0.2 %    Comment: Performed at [facility], [address] [place], [place] [address]  ECHOCARDIOGRAM COMPLETE     Status: None   Collection Time: 08/16/20  3:34 PM  Result Value Ref Range   AR max vel 2.19 cm2   AV Peak grad 12.7 mmHg   Ao pk vel 1.79 m/s   Area-P 1/2 4.24 cm2   AV Area VTI 1.89 cm2   AV Mean grad 8.0 mmHg   AV Area mean vel 1.92 cm2    Assessment/Plan:  Edema I have had a long discussion with the patient regarding swelling and why it  causes symptoms.  Patient will begin wearing graduated compression stockings class 1 (20-30 mmHg) on a daily basis a prescription was given. The patient will  beginning wearing the stockings first thing in the morning and removing them in the evening. The patient is instructed specifically not to sleep in the stockings.   In addition, behavioral modification will be initiated.  This will include frequent elevation, use of over the counter pain medications and exercise such as walking.  I have reviewed systemic causes for chronic edema such as liver, kidney and cardiac etiologies.  The patient denies problems with these organ systems.     Consideration for a lymph pump will also be  made based upon the effectiveness of conservative therapy.  This would help to improve the edema control and prevent sequela such as ulcers and infections   Patient should undergo duplex ultrasound of the venous system to ensure that DVT or reflux is not present.  The patient will follow-up with me after the ultrasound.    Hyperlipidemia lipid control important in reducing the progression of atherosclerotic disease. Continue statin therapy   Type 2 diabetes mellitus with complications blood glucose control important in reducing the progression of atherosclerotic disease. Also, involved in wound healing. On appropriate medications.   Essential hypertension blood pressure control important in reducing the progression of atherosclerotic disease. On appropriate oral medications.   Ulcerated, foot, right, with fat layer exposed (HCC) The patient has multiple atherosclerotic risk factors and a nonhealing ulceration on the right foot with nonpalpable pedal pulses.  Arterial assessment be done with noninvasive studies in the near future at her convenience.  This is potentially a limb threatening situation.      Linda Briggs 08/18/2020, 10:46 AM   This note was created with Dragon medical transcription system.  Any errors from dictation are unintentional.

## 2020-08-23 ENCOUNTER — Encounter
Admission: RE | Admit: 2020-08-23 | Discharge: 2020-08-23 | Disposition: A | Discharge status: Home / Self Care | Payer: Medicare HMO | Source: Ambulatory Visit | Attending: Physician Assistant | Admitting: Physician Assistant

## 2020-08-23 ENCOUNTER — Other Ambulatory Visit: Payer: Self-pay

## 2020-08-23 DIAGNOSIS — R0600 Dyspnea, unspecified: Secondary | ICD-10-CM | POA: Insufficient documentation

## 2020-08-23 DIAGNOSIS — R0609 Other forms of dyspnea: Secondary | ICD-10-CM

## 2020-08-28 ENCOUNTER — Other Ambulatory Visit: Payer: Self-pay

## 2020-08-28 ENCOUNTER — Ambulatory Visit (INDEPENDENT_AMBULATORY_CARE_PROVIDER_SITE_OTHER): Payer: Medicare HMO

## 2020-08-28 ENCOUNTER — Encounter (INDEPENDENT_AMBULATORY_CARE_PROVIDER_SITE_OTHER): Payer: Self-pay | Admitting: Nurse Practitioner

## 2020-08-28 ENCOUNTER — Ambulatory Visit (INDEPENDENT_AMBULATORY_CARE_PROVIDER_SITE_OTHER): Payer: Medicare HMO | Admitting: Nurse Practitioner

## 2020-08-28 VITALS — BP 118/73 | HR 78 | Resp 16 | Wt 302.0 lb

## 2020-08-28 DIAGNOSIS — I890 Lymphedema, not elsewhere classified: Secondary | ICD-10-CM | POA: Diagnosis not present

## 2020-08-28 DIAGNOSIS — R609 Edema, unspecified: Secondary | ICD-10-CM | POA: Diagnosis not present

## 2020-08-28 DIAGNOSIS — I10 Essential (primary) hypertension: Secondary | ICD-10-CM | POA: Diagnosis not present

## 2020-08-28 DIAGNOSIS — L97512 Non-pressure chronic ulcer of other part of right foot with fat layer exposed: Secondary | ICD-10-CM | POA: Diagnosis not present

## 2020-08-28 DIAGNOSIS — E7800 Pure hypercholesterolemia, unspecified: Secondary | ICD-10-CM | POA: Diagnosis not present

## 2020-08-30 ENCOUNTER — Other Ambulatory Visit: Payer: Self-pay

## 2020-08-30 ENCOUNTER — Telehealth: Payer: Self-pay | Admitting: *Deleted

## 2020-08-30 ENCOUNTER — Encounter
Admission: RE | Admit: 2020-08-30 | Discharge: 2020-08-30 | Disposition: A | Discharge status: Home / Self Care | Payer: Medicare HMO | Source: Ambulatory Visit | Attending: Physician Assistant | Admitting: Physician Assistant

## 2020-08-30 DIAGNOSIS — R0600 Dyspnea, unspecified: Secondary | ICD-10-CM

## 2020-08-30 MED ORDER — REGADENOSON 0.4 MG/5ML IV SOLN
0.4000 mg | Freq: Once | INTRAVENOUS | Status: AC
  Administered 2020-08-30: 09:00:00 0.4 mg via INTRAVENOUS

## 2020-08-30 MED ORDER — TECHNETIUM TC 99M TETROFOSMIN IV KIT
30.0000 | PACK | Freq: Once | INTRAVENOUS | Status: AC | PRN
  Administered 2020-08-30: 09:00:00 31.28 via INTRAVENOUS

## 2020-08-30 NOTE — Telephone Encounter (Signed)
Left voicemail message regarding rescheduled appointment and requested that she please call back.

## 2020-08-30 NOTE — Telephone Encounter (Signed)
-----   Message from Sondra Barges, PA-C sent at 08/30/2020  9:10 AM EST ----- I just ran her stress test. She would like to change her appointment from 1/4 with Dr. Lewie Loron to 1/7 with me at 3:30 PM. Can you please make this change and given her a call? Thanks.

## 2020-08-30 NOTE — Telephone Encounter (Signed)
Spoke with patient and reviewed that appointment was changed for her and confirmed date, time, and to arrive early at the [facility] entrance to allow time for check in. She verbalized understanding with no further questions at this time.

## 2020-08-30 NOTE — Telephone Encounter (Signed)
No answer. No voicemail. 

## 2020-08-31 ENCOUNTER — Encounter
Admission: RE | Admit: 2020-08-31 | Discharge: 2020-08-31 | Disposition: A | Discharge status: Home / Self Care | Payer: Medicare HMO | Source: Ambulatory Visit | Attending: Physician Assistant | Admitting: Physician Assistant

## 2020-08-31 LAB — NM MYOCAR MULTI W/SPECT W/WALL MOTION / EF
LV dias vol: 67 mL (ref 46–106)
LV sys vol: 36 mL
Peak HR: 87 {beats}/min
Percent HR: 55 %
Rest HR: 66 {beats}/min
SDS: 0
SRS: 10
SSS: 6
TID: 1.08

## 2020-08-31 MED ORDER — TECHNETIUM TC 99M TETROFOSMIN IV KIT
30.0000 | PACK | Freq: Once | INTRAVENOUS | Status: AC | PRN
  Administered 2020-08-31: 09:00:00 31.28 via INTRAVENOUS

## 2020-08-31 MED ORDER — TECHNETIUM TC 99M TETROFOSMIN IV KIT: 30.0000 | PACK | Freq: Once | INTRAVENOUS | Status: DC | PRN

## 2020-08-31 NOTE — Telephone Encounter (Signed)
Pt had Myoview 08/30/20. Follow up scheduled w/ Eula Listen, PA 09/08/20.

## 2020-09-04 ENCOUNTER — Encounter: Payer: Self-pay | Admitting: Podiatry

## 2020-09-04 ENCOUNTER — Ambulatory Visit: Payer: Medicare HMO | Admitting: Podiatry

## 2020-09-04 ENCOUNTER — Other Ambulatory Visit: Payer: Self-pay

## 2020-09-04 ENCOUNTER — Ambulatory Visit: Payer: Self-pay

## 2020-09-04 DIAGNOSIS — E7800 Pure hypercholesterolemia, unspecified: Secondary | ICD-10-CM

## 2020-09-04 DIAGNOSIS — E11621 Type 2 diabetes mellitus with foot ulcer: Secondary | ICD-10-CM

## 2020-09-04 DIAGNOSIS — I10 Essential (primary) hypertension: Secondary | ICD-10-CM

## 2020-09-04 DIAGNOSIS — E118 Type 2 diabetes mellitus with unspecified complications: Secondary | ICD-10-CM

## 2020-09-04 DIAGNOSIS — L97512 Non-pressure chronic ulcer of other part of right foot with fat layer exposed: Secondary | ICD-10-CM

## 2020-09-04 DIAGNOSIS — E1142 Type 2 diabetes mellitus with diabetic polyneuropathy: Secondary | ICD-10-CM

## 2020-09-04 NOTE — Progress Notes (Signed)
Cardiology Office Note    Date:  09/08/2020   ID:  Linda, Briggs 06-08-1956, MRN [id number]  PCP:  Donita Brooks, MD  Cardiologist:  Julien Nordmann, MD  Electrophysiologist:  None   Chief Complaint: Follow-up  History of Present Illness:   Linda Briggs is a 65 y.o. female with history of insulin-dependent diabetes mellitus with peripheral neuropathy, HTN, HLD, morbid obesity, chronic lower extremity swelling with lymphedema, reported COVID-19 in 11/2018, and sinus tachycardia who presents for follow-up of recent echo and Lexiscan MPI.  Prior Holter monitor in 03/2012 showed normal sinus rhythm with rare PACs and frequent periods of sinus tachycardia with no significant arrhythmias.  Echo in 04/2012 showed an EF of 55%, no regional wall motion abnormalities, normal LV diastolic function parameters, normal RV systolic function and PASP.  No significant valvular abnormalities.  Carotid artery ultrasound in 2016 showed minimal disease. She reports a clinical diagnosis of COVID-19 in 11/2018 with loss of taste associated with URI symptoms. No formal testing at that time. She was seen in the office in 07/2019 and was doing well from a cardiac perspective noting rare atypical episodes of chest pain.  She indicated her heart rate was well controlled on metoprolol 25 mg twice daily and at times took 50 mg.  She was seen on 07/21/2020 and noted since her clinical diagnosis of COVID-19 in 11/2018 she had progressive exertional dyspnea coupled with worsening lower extremity swelling without frank angina.  In this setting, she underwent echo on 08/16/2020 which showed an EF of 55 to 60%, no regional wall motion abnormalities, grade 2 diastolic dysfunction, normal RV systolic function and ventricular cavity size, normal PASP, pericardial effusion identified posterior lateral to the LV, no significant valvular abnormalities, and an estimated right atrial pressure of 3 mmHg.  Lexiscan MPI in 08/2020  showed no significant ischemia with mild aortic atherosclerosis and minimal coronary artery calcification noted on CT attenuation corrected images.  Overall, this was a low risk scan.  She has subsequently been seen by vascular surgery for lower extremity swelling as well as podiatry for a pedal ulcer.  She comes in today doing reasonably well from a cardiac perspective.  Since she was last seen she denies any chest pain.  She does like her dyspnea is a little improved.  Since self discontinuing Actos she notes her lower extremity swelling has improved.  She has not yet been able to pick up compression stockings recommended by vascular surgery given to the medical supply store without of the zip up kind.  She is awaiting insurance evaluation for possible lymphedema pumps.  She does try and elevate her legs when she is sitting.  Her weight is down 5 pounds when compared to her last visit.  She denies any presyncope or syncope.  She does try and watch her salt intake.  She is trying to cut out carbs.  She was briefly dizzy following her Lexiscan MPI though this has resolved.  She did note some mild intermittent sharp chest discomfort following radionucleotide tracer injection which also has resolved.  She has follow-up with her PCP later this month.   Labs independently reviewed: 08/2020 - Hgb 14.8, PLT 184, BUN 17, serum creatinine 0.87, potassium 4.0 03/2020 - A1c 7.0, TC 168, TG 188, HDL 29, LDL 109, albumin 4.2, AST 47, ALT 43, Hgb 14.7, PLT 135 08/2014 - TSH normal    Past Medical History:  Diagnosis Date  . Arthritis    feet & toes  .  Bulging disc   . Diabetes mellitus   . Gastroparesis   . History of hepatitis B   . Hyperlipidemia   . Hypertension   . Neuromuscular disorder (HCC)    in feet and fingers   . Neuropathy    feet  . Nonproliferative diabetic retinopathy (HCC)   . 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 Peripheral vascular disease (HCC)   . Shortness of breath  dyspnea    on lopressor   . Sleep apnea    pt had study done but it was incomplete due to blood sugar issues     Past Surgical History:  Procedure Laterality Date  . BIOPSY  04/28/2018   Procedure: BIOPSY;  Surgeon: Meryl Dare, MD;  Location: [facility];  Service: Endoscopy;;  . BIOPSY THYROID     january 2016  . ESOPHAGOGASTRODUODENOSCOPY (EGD) WITH PROPOFOL N/A 04/28/2018   Procedure: ESOPHAGOGASTRODUODENOSCOPY (EGD) WITH PROPOFOL;  Surgeon: Meryl Dare, MD;  Location: [facility];  Service: Endoscopy;  Laterality: N/A;  . HYSTEROSCOPY WITH D & C N/A 02/15/2015   Procedure: DILATATION AND CURETTAGE /HYSTEROSCOPY;  Surgeon: Mitchel Honour, DO;  Location: [facility];  Service: Gynecology;  Laterality: N/A;  . TONSILLECTOMY      Current Medications: Current Meds  Medication Sig  . Alcohol Swabs (B-D SINGLE USE SWABS REGULAR) PADS Use to monitor FSBS 3x daily for fluctuating FSBS. DX: 250.00  . aspirin EC 81 MG tablet Take 81 mg by mouth daily.  . Blood Glucose Calibration (TRUE METRIX LEVEL 2) Normal SOLN Use as directed to monitor FSBS 3x daily. Dx: E11.65  . Blood Glucose Monitoring Suppl (TRUE METRIX AIR GLUCOSE METER) DEVI 1 each by Does not apply route daily. Use as directed to monitor FSBS 3x daily. Dx: E11.65  . diclofenac (FLECTOR) 1.3 % PTCH Place 1 patch onto the skin 2 (two) times daily.  Marland Kitchen FARXIGA 10 MG TABS tablet TAKE 1 TABLET EVERY DAY BEFORE BREAKFAST  . furosemide (LASIX) 20 MG tablet TAKE 1 TABLET EVERY DAY  . gabapentin (NEURONTIN) 300 MG capsule TAKE 1 CAPSULE THREE TIMES DAILY  . glipiZIDE (GLUCOTROL XL) 5 MG 24 hr tablet TAKE 1 TABLET  DAILY WITH BREAKFAST.  Marland Kitchen glucose blood (TRUE METRIX BLOOD GLUCOSE TEST) test strip Use as directed to monitor FSBS 3x daily. Dx: E11.65  . lisinopril (ZESTRIL) 5 MG tablet Take 1 tablet (5 mg total) by mouth daily.  . metFORMIN (GLUCOPHAGE) 1000 MG tablet TAKE 1 TABLET TWICE DAILY WITH A MEAL  . metoprolol tartrate  (LOPRESSOR) 50 MG tablet Take 0.5 tablet (25MG ) by mouth Twice a day  . Multiple Vitamin (MULTIVITAMIN) tablet Take 1 tablet by mouth daily.  . mupirocin ointment (BACTROBAN) 2 % Apply 1 application topically 2 (two) times daily.  . ondansetron (ZOFRAN) 4 MG tablet Take 1 tablet (4 mg total) by mouth every 8 (eight) hours as needed for nausea or vomiting.  . pantoprazole (PROTONIX) 40 MG tablet Take 1 tablet (40 mg total) by mouth 2 (two) times daily before a meal.  . pioglitazone (ACTOS) 30 MG tablet TAKE 1 TABLET EVERY DAY  . potassium chloride (KLOR-CON) 10 MEQ tablet TAKE 1 TABLET EVERY DAY  . pravastatin (PRAVACHOL) 40 MG tablet TAKE 1 TABLET EVERY DAY  . traMADol (ULTRAM) 50 MG tablet TAKE 1 TABLET BY MOUTH EVERY 6 HOURS AS NEEDED FOR  CHRONIC  BACK  PAIN  . TRUEplus Lancets 30G MISC Use as directed to monitor FSBS  3x daily. Dx: E11.65  . TRULICITY 1.5 MG/0.5ML SOPN INJECT 1.5MG  (1 PEN) SUBCUTANEOUSLY EVERY WEEK AS DIRECTED    Allergies:   Codeine, Penicillins, Ciprofloxacin, Food, Januvia [sitagliptin], and Latex   Social History   Socioeconomic History  . Marital status: Single    Spouse name: Not on file  . Number of children: 0  . Years of education: Not on file  . Highest education level: Not on file  Occupational History  . Occupation: retired  Tobacco Use  . Smoking status: Never Smoker  . Smokeless tobacco: Never Used  Vaping Use  . Vaping Use: Never used  Substance and Sexual Activity  . Alcohol use: No    Comment: social drinker.  . Drug use: No  . Sexual activity: Not on file  Other Topics Concern  . Not on file  Social History Narrative  . Not on file   Social Determinants of Health   Financial Resource Strain: Low Risk   . Difficulty of Paying Living Expenses: Not very hard  Food Insecurity: Not on file  Transportation Needs: Not on file  Physical Activity: Not on file  Stress: Not on file  Social Connections: Not on file     Family History:  The  patient's family history includes Heart disease in her brother, brother, and father; Hyperlipidemia in her father and mother; Hypertension in her father; Kidney cancer in her sister; Kidney disease in her maternal aunt, maternal uncle, and sister; Ovarian cancer in her maternal aunt; Stroke in her brother. There is no history of Colon cancer or Stomach cancer.  ROS:   Review of Systems  Constitutional: Positive for malaise/fatigue. Negative for chills, diaphoresis, fever and weight loss.  HENT: Negative for congestion.   Eyes: Negative for discharge and redness.  Respiratory: Positive for shortness of breath. Negative for cough, sputum production and wheezing.   Cardiovascular: Positive for leg swelling. Negative for chest pain, palpitations, orthopnea, claudication and PND.  Gastrointestinal: Negative for abdominal pain, heartburn, nausea and vomiting.  Musculoskeletal: Negative for falls and myalgias.  Skin: Negative for rash.  Neurological: Positive for weakness. Negative for dizziness, tingling, tremors, sensory change, speech change, focal weakness and loss of consciousness.  Endo/Heme/Allergies: Does not bruise/bleed easily.  Psychiatric/Behavioral: Negative for substance abuse. The patient is not nervous/anxious.   All other systems reviewed and are negative.    EKGs/Labs/Other Studies Reviewed:    Studies reviewed were summarized above. The additional studies were reviewed today:  2D echo 04/2012: - Left ventricle: The cavity size was normal. There was mild  concentric hypertrophy. Systolic function was normal. The  estimated ejection fraction was in the range of 55% to  60%. Wall motion was normal; there were no regional wall  motion abnormalities. Left ventricular diastolic function  parameters were normal.  - Right ventricle: Systolic function was normal.  - Pulmonary arteries: Systolic pressure was within the  normal range.  Impressions:   - Normal  study. __________  2D echo 08/16/2020: 1. Left ventricular ejection fraction, by estimation, is 55 to 60%. The  left ventricle has normal function. The left ventricle has no regional  wall motion abnormalities. Left ventricular diastolic parameters are  consistent with Grade II diastolic  dysfunction (pseudonormalization).  2. Right ventricular systolic function is normal. The right ventricular  size is normal. There is normal pulmonary artery systolic pressure.  3. The pericardial effusion is posterior and lateral to the left  ventricle.  4. The mitral valve is grossly normal. No evidence  of mitral valve  regurgitation.  5. The aortic valve was not well visualized. Aortic valve regurgitation  is not visualized.  6. The inferior vena cava is normal in size with greater than 50%  respiratory variability, suggesting right atrial pressure of 3 mmHg.  __________  Carl[facility] Adam MPI 08/31/2020: Pharmacological myocardial perfusion imaging study with no significant  ischemia Normal wall motion, EF estimated at 78% No EKG changes concerning for ischemia at peak stress or in recovery. CT attenuation correction images with mild aortic atherosclerosis, minimal coronary calcification Low risk scan   EKG:  EKG is not ordered today.   Recent Labs: 03/20/2020: ALT 43 08/05/2020: BUN 17; Creatinine, Ser 0.87; Hemoglobin 14.8; Platelets 184; Potassium 4.0; Sodium 137  Recent Lipid Panel    Component Value Date/Time   CHOL 168 03/20/2020 1146   TRIG 188 (H) 03/20/2020 1146   HDL 29 (L) 03/20/2020 1146   CHOLHDL 5.8 (H) 03/20/2020 1146   VLDL 24 02/10/2017 1217   LDLCALC 109 (H) 03/20/2020 1146    PHYSICAL EXAM:    VS:  BP 134/62 (BP Location: Right Arm, Patient Position: Sitting, Cuff Size: Large)   Pulse 77   Ht 5\' 6"  (1.676 m)   Wt (!) 303 lb (137.4 kg)   SpO2 98%   BMI 48.91 kg/m   BMI: Body mass index is 48.91 kg/m.  Physical Exam Vitals reviewed.  Constitutional:       Appearance: She is well-developed and well-nourished.  HENT:     Head: Normocephalic and atraumatic.  Eyes:     General:        Right eye: No discharge.        Left eye: No discharge.  Neck:     Vascular: No JVD.  Cardiovascular:     Rate and Rhythm: Normal rate and regular rhythm.     Pulses: No midsystolic click and no opening snap.          Posterior tibial pulses are 1+ on the right side and 1+ on the left side.     Heart sounds: Normal heart sounds, S1 normal and S2 normal. Heart sounds not distant. No murmur heard. No friction rub.  Pulmonary:     Effort: Pulmonary effort is normal. No respiratory distress.     Breath sounds: Normal breath sounds. No decreased breath sounds, wheezing or rales.  Chest:     Chest wall: No tenderness.  Abdominal:     General: There is no distension.     Palpations: Abdomen is soft.     Tenderness: There is no abdominal tenderness.  Musculoskeletal:     Cervical back: Normal range of motion.     Right lower leg: Edema present.     Left lower leg: Edema present.     Comments: Lymphedema with chronic hyperpigmentation/woody appearance noted of the bilateral lower extremities   Skin:    General: Skin is warm and dry.     Nails: There is no clubbing or cyanosis.  Neurological:     Mental Status: She is alert and oriented to person, place, and time.  Psychiatric:        Mood and Affect: Mood and affect normal.        Speech: Speech normal.        Behavior: Behavior normal.        Thought Content: Thought content normal.        Judgment: Judgment normal.     Wt Readings from Last 3 Encounters:  09/08/20 Marland Kitchen)  303 lb (137.4 kg)  08/28/20 (!) 302 lb (137 kg)  08/18/20 (!) 306 lb (138.8 kg)     ASSESSMENT & PLAN:   1. Dyspnea/lower extremity swelling/lymphedema: Cardiac work-up including echo and Lexiscan MPI were reassuring as outlined above.  Suspect her symptoms of dyspnea and lower extremity swelling/lymphedema are multifactorial  including morbid obesity with dependent edema and with significant physical deconditioning, coupled with possible prior Covid infection.  Referral to pulmonary rehab.  Recommend continued leg elevation, compression stockings, and lymphedema pumps as directed by vascular surgery.  2. Sinus tachycardia: Heart rate is well controlled in the office today.  She remains on metoprolol.  3. HTN: Blood pressure is reasonably controlled in the office.  Continue current medications including furosemide, lisinopril, and metoprolol.  Low-sodium diet recommended.  4. HLD: LDL 109 from 03/2020 with mildly elevated LFTs at that time on pravastatin.  Followed by PCP.  5. Morbid obesity with physical deconditioning:  Disposition: F/u with Dr. Mariah Milling or an APP in 6 months.   Medication Adjustments/Labs and Tests Ordered: Current medicines are reviewed at length with the patient today.  Concerns regarding medicines are outlined above. Medication changes, Labs and Tests ordered today are summarized above and listed in the Patient Instructions accessible in Encounters.   Signed, Eula Listen, PA-C 09/08/2020 4:10 PM     [facility] [address] [place], [place] [address] [phone]

## 2020-09-04 NOTE — Chronic Care Management (AMB) (Signed)
Chronic Care Management Pharmacy  Name: Linda Briggs  MRN: [id number] DOB: Dec 02, 1955  Chief Complaint/ HPI  Linda Briggs,  65 y.o. , female presents for their Follow-Up CCM visit with the clinical pharmacist In office.  PCP : Susy Frizzle, MD  Their chronic conditions include: hypertension, Type II DM with retinopathy, hyperlipidemia.  Office Visits: 12/20/2019 ([facility]) - severe retinopathy, severe neuropathy, added Farxiga 77m daily, recheck A1c in 3 months  09/20/2019 ([facility]) - virus, symptoms x 6 days, recommended to hold furosemide, glipizide until appetite and diarrhea subsides to avoid dehydration and hypoglycemia  Consult Visit:none available  Medications: Outpatient Encounter Medications as of 09/04/2020  Medication Sig   Alcohol Swabs (B-D SINGLE USE SWABS REGULAR) PADS Use to monitor FSBS 3x daily for fluctuating FSBS. DX: 250.00   aspirin EC 81 MG tablet Take 81 mg by mouth daily.   Blood Glucose Calibration (TRUE METRIX LEVEL 2) Normal SOLN Use as directed to monitor FSBS 3x daily. Dx: E11.65   Blood Glucose Monitoring Suppl (TRUE METRIX AIR GLUCOSE METER) DEVI 1 each by Does not apply route daily. Use as directed to monitor FSBS 3x daily. Dx: E11.65   diclofenac (FLECTOR) 1.3 % PTCH Place 1 patch onto the skin 2 (two) times daily.   FARXIGA 10 MG TABS tablet TAKE 1 TABLET EVERY DAY BEFORE BREAKFAST   furosemide (LASIX) 20 MG tablet TAKE 1 TABLET EVERY DAY   gabapentin (NEURONTIN) 300 MG capsule TAKE 1 CAPSULE THREE TIMES DAILY   glipiZIDE (GLUCOTROL XL) 5 MG 24 hr tablet TAKE 1 TABLET  DAILY WITH BREAKFAST.   glucose blood (TRUE METRIX BLOOD GLUCOSE TEST) test strip Use as directed to monitor FSBS 3x daily. Dx: E11.65   lisinopril (ZESTRIL) 5 MG tablet Take 1 tablet (5 mg total) by mouth daily.   metFORMIN (GLUCOPHAGE) 1000 MG tablet TAKE 1 TABLET TWICE DAILY WITH A MEAL   metoprolol tartrate (LOPRESSOR) 50 MG tablet Take 0.5 tablet (25MG)  by mouth Twice a day   Multiple Vitamin (MULTIVITAMIN) tablet Take 1 tablet by mouth daily.   mupirocin ointment (BACTROBAN) 2 % Apply 1 application topically 2 (two) times daily.   ondansetron (ZOFRAN) 4 MG tablet Take 4 mg by mouth every 8 (eight) hours as needed for nausea or vomiting.   pantoprazole (PROTONIX) 40 MG tablet Take 1 tablet (40 mg total) by mouth daily.   pioglitazone (ACTOS) 30 MG tablet TAKE 1 TABLET EVERY DAY   potassium chloride (KLOR-CON) 10 MEQ tablet TAKE 1 TABLET EVERY DAY   pravastatin (PRAVACHOL) 40 MG tablet TAKE 1 TABLET EVERY DAY   sulfamethoxazole-trimethoprim (BACTRIM DS) 800-160 MG tablet Take 1 tablet by mouth 2 (two) times daily.   traMADol (ULTRAM) 50 MG tablet TAKE 1 TABLET BY MOUTH EVERY 6 HOURS AS NEEDED FOR  CHRONIC  BACK  PAIN   TRUEplus Lancets 30G MISC Use as directed to monitor FSBS 3x daily. Dx: EK93.81  TRULICITY 1.5 MWE/9.9BZSOPN INJECT 1.5MG (1 PEN) SUBCUTANEOUSLY EVERY WEEK AS DIRECTED   No facility-administered encounter medications on file as of 09/04/2020.     Current Diagnosis/Assessment:   Goals Addressed            This Visit's Progress    Pharmacy Care Plan:       CARE PLAN ENTRY (see longitudinal plan of care for additional care plan information)  Current Barriers:   Chronic Disease Management support, education, and care coordination needs related to Hypertension, Hyperlipidemia, and Diabetes  Hypertension BP Readings from Last 3 Encounters:  08/28/20 118/73  08/18/20 127/84  08/05/20 (!) 141/84    Pharmacist Clinical Goal(s): o Over the next 120 days, patient will work with PharmD and providers to maintain BP goal <140/90  Current regimen:  o Lisinopril 102m o Metoprolol 572mtwice daily  Interventions: o Reviewed home blood pressure readings o Comprehensive medication review o Recommended periodic home monitoring  Patient self care activities - Over the next 120 days, patient will: o Check BP  daily, document, and provide at future appointments o Ensure daily salt intake < 2300 mg/day   Hyperlipidemia Lab Results  Component Value Date/Time   LDLCALC 109 (H) 03/20/2020 11:46 AM    Pharmacist Clinical Goal(s): o Over the next 120 days, patient will work with PharmD and providers to achieve LDL goal < 100  Current regimen:  o Pravastating 4064maily  Interventions: o Reviewed most recent lipid panel o Counsel on medication adherence  Patient self care activities - Over the next 120 days, patient will: o Continue to take medication as directed o Focus on medication adherence by pill box.  Diabetes Lab Results  Component Value Date/Time   HGBA1C 7.0 (H) 03/20/2020 11:46 AM   HGBA1C 7.8 (H) 12/15/2019 11:17 AM    Pharmacist Clinical Goal(s): o Over the next 120 days, patient will work with PharmD and providers to maintain A1c goal <7%  Current regimen:  o Farxiga 10m44IHTrulicity 1.54.7QQr week o Glipizide XL 5mg20mblet o Metformin 1000mg29mce daily  Interventions: o Reviewed home blood sugar logs o Counseled on diet beneficial to blood sugar control o Encouraged continued lifestyle modifications  Patient self care activities - Over the next 120 days, patient will: o Check blood sugar once daily, document, and provide at future appointments o Contact provider with any episodes of hypoglycemia o Continue limitation of carbs and sugary beverages  Please see past updates related to this goal by clicking on the "Past Updates" button in the selected goal         Diabetes   Recent Relevant Labs: Lab Results  Component Value Date/Time   HGBA1C 7.0 (H) 03/20/2020 11:46 AM   HGBA1C 7.8 (H) 12/15/2019 11:17 AM   MICROALBUR 0.6 06/21/2019 11:59 AM   MICROALBUR 2.3 08/18/2017 12:08 PM     Checking BG: Daily  Recent FBG Readings: 120-140s, occasional 180s depending on diet  Patient has failed these meds in past: Januvia (cramps)  Patient is currently  uncontrolled on the following medications:   Farxiga 10mg 59DGlicity 1.5mg3.8VFweek  glipizide XL 5mg t52met  metformin 1000mg t44m daily    Last diabetic Foot exam:  Lab Results  Component Value Date/Time   HMDIABEYEEXA Retinopathy (A) 03/25/2019 12:00 AM    Last diabetic Eye exam: No results found for: HMDIABFOOTEX   We discussed:  Patient has stopped taking Actos due to leg swelling  She reports about 9 pound weight loss since last visit with us.  ShKoreais not drinking as much sodas, limiting her juice and carbohydrates.  Patient is really working hard on her dietary choices which has led to improved fasting blood sugar levels with occasional exception when she splurges on something she knows she shouldn't eat.  Denies any hypoglycemia  She has follow up with Dr PickardDennard Schaumanneeks, recommend repeat A1c to see how she has been off the Actos    Plan  Continue current medications.  Recheck A1c at visit in a  few weeks.   Hypertension     Office blood pressures are  BP Readings from Last 3 Encounters:  08/28/20 118/73  08/18/20 127/84  08/05/20 (!) 141/84    Patient has failed these meds in the past: none noted  Patient checks BP at home infrequently  Patient home BP readings are ranging: 99/64 at stress test recently Patient is currently controlled on the following medications:   lisinopril 59m   metoprolol 528mtwice daily We discussed:  Need for regular home monitoring  Patient being followed by cardiology post COVID to ensure no structural damage to hear  Plan  Continue current medications.  Contact PharmD if BP is consistently < 100/60.     Hyperlipidemia   LDL goal < 100  Lipid Panel     Component Value Date/Time   CHOL 168 03/20/2020 1146   TRIG 188 (H) 03/20/2020 1146   HDL 29 (L) 03/20/2020 1146   LDLCALC 109 (H) 03/20/2020 1146    Hepatic Function Latest Ref Rng & Units 03/20/2020 12/15/2019 06/21/2019  Total Protein 6.1 - 8.1 g/dL  7.0 6.5 6.6  Albumin 3.6 - 5.1 g/dL - - -  AST 10 - 35 U/L 47(H) 21 29  ALT 6 - 29 U/L 43(H) 25 22  Alk Phosphatase 33 - 130 U/L - - -  Total Bilirubin 0.2 - 1.2 mg/dL 0.8 0.6 0.6     The 10-year ASCVD risk score (GMikey BussingC Jr., et al., 2013) is: 12.1%   Values used to calculate the score:     Age: 89ears     Sex: Female     Is Non-Hispanic African American: No     Diabetic: Yes     Tobacco smoker: No     Systolic Blood Pressure: 11824mHg     Is BP treated: Yes     HDL Cholesterol: 29 mg/dL     Total Cholesterol: 168 mg/dL   Patient has failed these meds in past: none noted Patient is currently uncontrolled on the following medications:   Pravastatin 4096mWe discussed:    Reports adherence, discussed dietary changes to lower LDL.    If she continues to lose weight and focus on lifestyle mods, I believe this statin dose will bring lipids to goal  Recommended updated lipid panel at f/u visit in 3 weeks  Plan  Continue current medications, recheck lipid panel at next visit.  Consider increase on statin medication if cholesterol still elevated. Vaccines   Reviewed and discussed patient's vaccination history.    Immunization History  Administered Date(s) Administered   Influenza,inj,Quad PF,6+ Mos 05/30/2014, 05/29/2015, 07/02/2017, 05/22/2018   Pneumococcal Conjugate-13 08/18/2017   Pneumococcal Polysaccharide-23 08/29/2014    Plan  Recommended patient receive Shingrix vaccine in office.  Medication Management    Miscellaneous medications: potassium 56m77mfurosemide 20mg54mbapentin 300mg 69m's: ASA 81mg, 50m Patient currently uses [employer] #  [phone] reports using pill box method to organize medications and promote adherence.  Patient denies missed doses of medication.   ChristiBeverly MilchD Clinical Pharmacist [phone]

## 2020-09-05 ENCOUNTER — Telehealth: Payer: Self-pay | Admitting: *Deleted

## 2020-09-05 ENCOUNTER — Ambulatory Visit: Payer: Medicare HMO | Admitting: Cardiovascular Disease

## 2020-09-05 MED ORDER — ONDANSETRON HCL 4 MG PO TABS
4.0000 mg | ORAL_TABLET | Freq: Three times a day (TID) | ORAL | 3 refills | Status: DC | PRN
Start: 2020-09-05 — End: 2021-03-26

## 2020-09-05 MED ORDER — PANTOPRAZOLE SODIUM 40 MG PO TBEC
40.0000 mg | DELAYED_RELEASE_TABLET | Freq: Two times a day (BID) | ORAL | 2 refills | Status: DC
Start: 2020-09-05 — End: 2021-04-04

## 2020-09-05 NOTE — Telephone Encounter (Signed)
-----   Message from Erroll Luna, [facility] sent at 09/05/2020  9:41 AM EST ----- Mrs. Wolfer has requested we send a refill for Zofran to her [facility] mail order pharmacy for refill.  She has also asked that we update her Rx directions for Pantoprazole 40mg  to one tablet TWICE daily.  Apparently she used to take twice daily but this most recent rx was for once daily.  She would like this at [address] [facility] mail order as well.  THANKS!   [facility], PharmD Clinical Pharmacist [facility] [phone]

## 2020-09-05 NOTE — Telephone Encounter (Signed)
Call placed to patient and patient made aware.   Prescription sent to pharmacy.   Patient states taht she and sister Burna Mortimer have F/U appointments with PCP on 09/25/2020. Reports that sister will not come into office any sooner.   Also reports that patient sister knows she needs colonoscopy, but is refusing at this time. States that sister also has a lump to breast, but is refusing mammogram as well.   PCP to be made aware.

## 2020-09-05 NOTE — Patient Instructions (Addendum)
Visit Information  Goals Addressed            This Visit's Progress   . Pharmacy Care Plan:       CARE PLAN ENTRY (see longitudinal plan of care for additional care plan information)  Current Barriers:  . Chronic Disease Management support, education, and care coordination needs related to Hypertension, Hyperlipidemia, and Diabetes   Hypertension BP Readings from Last 3 Encounters:  08/28/20 118/73  08/18/20 127/84  08/05/20 (!) 141/84   . Pharmacist Clinical Goal(s): o Over the next 120 days, patient will work with PharmD and providers to maintain BP goal <140/90 . Current regimen:  o Lisinopril 5mg  o Metoprolol 50mg  twice daily . Interventions: o Reviewed home blood pressure readings o Comprehensive medication review o Recommended periodic home monitoring . Patient self care activities - Over the next 120 days, patient will: o Check BP daily, document, and provide at future appointments o Ensure daily salt intake < 2300 mg/day   Hyperlipidemia Lab Results  Component Value Date/Time   LDLCALC 109 (H) 03/20/2020 11:46 AM   . Pharmacist Clinical Goal(s): o Over the next 120 days, patient will work with PharmD and providers to achieve LDL goal < 100 . Current regimen:  o Pravastating 40mg  daily . Interventions: o Reviewed most recent lipid panel o Counsel on medication adherence . Patient self care activities - Over the next 120 days, patient will: o Continue to take medication as directed o Focus on medication adherence by pill box.  Diabetes Lab Results  Component Value Date/Time   HGBA1C 7.0 (H) 03/20/2020 11:46 AM   HGBA1C 7.8 (H) 12/15/2019 11:17 AM   . Pharmacist Clinical Goal(s): o Over the next 120 days, patient will work with PharmD and providers to maintain A1c goal <7% . Current regimen:  o Farxiga 10mg  o Trulicity 1.5mg  per week o Glipizide XL 5mg  tablet o Metformin 1000mg  twice daily . Interventions: o Reviewed home blood sugar  logs o Counseled on diet beneficial to blood sugar control o Encouraged continued lifestyle modifications . Patient self care activities - Over the next 120 days, patient will: o Check blood sugar once daily, document, and provide at future appointments o Contact provider with any episodes of hypoglycemia o Continue limitation of carbs and sugary beverages  Please see past updates related to this goal by clicking on the "Past Updates" button in the selected goal         The patient verbalized understanding of instructions, educational materials, and care plan provided today and agreed to receive a mailed copy of patient instructions, educational materials, and care plan.   Telephone follow up appointment with pharmacy team member scheduled for: 4 months  03/22/2020, PharmD Clinical Pharmacist 12/17/2019 Family Medicine [phone]   Hypertension, Adult High blood pressure (hypertension) is when the force of blood pumping through the arteries is too strong. The arteries are the blood vessels that carry blood from the heart throughout the body. Hypertension forces the heart to work harder to pump blood and may cause arteries to become narrow or stiff. Untreated or uncontrolled hypertension can cause a heart attack, heart failure, a stroke, kidney disease, and other problems. A blood pressure reading consists of a higher number over a lower number. Ideally, your blood pressure should be below 120/80. The first ("top") number is called the systolic pressure. It is a measure of the pressure in your arteries as your heart beats. The second ("bottom") number is called the diastolic pressure.  It is a measure of the pressure in your arteries as the heart relaxes. What are the causes? The exact cause of this condition is not known. There are some conditions that result in or are related to high blood pressure. What increases the risk? Some risk factors for high blood pressure are under  your control. The following factors may make you more likely to develop this condition:  Smoking.  Having type 2 diabetes mellitus, high cholesterol, or both.  Not getting enough exercise or physical activity.  Being overweight.  Having too much fat, sugar, calories, or salt (sodium) in your diet.  Drinking too much alcohol. Some risk factors for high blood pressure may be difficult or impossible to change. Some of these factors include:  Having chronic kidney disease.  Having a family history of high blood pressure.  Age. Risk increases with age.  Race. You may be at higher risk if you are African American.  Gender. Men are at higher risk than women before age 2. After age 70, women are at higher risk than men.  Having obstructive sleep apnea.  Stress. What are the signs or symptoms? High blood pressure may not cause symptoms. Very high blood pressure (hypertensive crisis) may cause:  Headache.  Anxiety.  Shortness of breath.  Nosebleed.  Nausea and vomiting.  Vision changes.  Severe chest pain.  Seizures. How is this diagnosed? This condition is diagnosed by measuring your blood pressure while you are seated, with your arm resting on a flat surface, your legs uncrossed, and your feet flat on the floor. The cuff of the blood pressure monitor will be placed directly against the skin of your upper arm at the level of your heart. It should be measured at least twice using the same arm. Certain conditions can cause a difference in blood pressure between your right and left arms. Certain factors can cause blood pressure readings to be lower or higher than normal for a short period of time:  When your blood pressure is higher when you are in a health care provider's office than when you are at home, this is called white coat hypertension. Most people with this condition do not need medicines.  When your blood pressure is higher at home than when you are in a health  care provider's office, this is called masked hypertension. Most people with this condition may need medicines to control blood pressure. If you have a high blood pressure reading during one visit or you have normal blood pressure with other risk factors, you may be asked to:  Return on a different day to have your blood pressure checked again.  Monitor your blood pressure at home for 1 week or longer. If you are diagnosed with hypertension, you may have other blood or imaging tests to help your health care provider understand your overall risk for other conditions. How is this treated? This condition is treated by making healthy lifestyle changes, such as eating healthy foods, exercising more, and reducing your alcohol intake. Your health care provider may prescribe medicine if lifestyle changes are not enough to get your blood pressure under control, and if:  Your systolic blood pressure is above 130.  Your diastolic blood pressure is above 80. Your personal target blood pressure may vary depending on your medical conditions, your age, and other factors. Follow these instructions at home: Eating and drinking   Eat a diet that is high in fiber and potassium, and low in sodium, added sugar, and fat.  An example eating plan is called the DASH (Dietary Approaches to Stop Hypertension) diet. To eat this way: ? Eat plenty of fresh fruits and vegetables. Try to fill one half of your plate at each meal with fruits and vegetables. ? Eat whole grains, such as whole-wheat pasta, brown rice, or whole-grain bread. Fill about one fourth of your plate with whole grains. ? Eat or drink low-fat dairy products, such as skim milk or low-fat yogurt. ? Avoid fatty cuts of meat, processed or cured meats, and poultry with skin. Fill about one fourth of your plate with lean proteins, such as fish, chicken without skin, beans, eggs, or tofu. ? Avoid pre-made and processed foods. These tend to be higher in sodium,  added sugar, and fat.  Reduce your daily sodium intake. Most people with hypertension should eat less than 1,500 mg of sodium a day.  Do not drink alcohol if: ? Your health care provider tells you not to drink. ? You are pregnant, may be pregnant, or are planning to become pregnant.  If you drink alcohol: ? Limit how much you use to:  0-1 drink a day for women.  0-2 drinks a day for men. ? Be aware of how much alcohol is in your drink. In the [place] one drink equals one 12 oz bottle of beer (355 mL), one 5 oz glass of wine (148 mL), or one 1 oz glass of hard liquor (44 mL). Lifestyle   Work with your health care provider to maintain a healthy body weight or to lose weight. Ask what an ideal weight is for you.  Get at least 30 minutes of exercise most days of the week. Activities may include walking, swimming, or biking.  Include exercise to strengthen your muscles (resistance exercise), such as Pilates or lifting weights, as part of your weekly exercise routine. Try to do these types of exercises for 30 minutes at least 3 days a week.  Do not use any products that contain nicotine or tobacco, such as cigarettes, e-cigarettes, and chewing tobacco. If you need help quitting, ask your health care provider.  Monitor your blood pressure at home as told by your health care provider.  Keep all follow-up visits as told by your health care provider. This is important. Medicines  Take over-the-counter and prescription medicines only as told by your health care provider. Follow directions carefully. Blood pressure medicines must be taken as prescribed.  Do not skip doses of blood pressure medicine. Doing this puts you at risk for problems and can make the medicine less effective.  Ask your health care provider about side effects or reactions to medicines that you should watch for. Contact a health care provider if you:  Think you are having a reaction to a medicine you are taking.  Have  headaches that keep coming back (recurring).  Feel dizzy.  Have swelling in your ankles.  Have trouble with your vision. Get help right away if you:  Develop a severe headache or confusion.  Have unusual weakness or numbness.  Feel faint.  Have severe pain in your chest or abdomen.  Vomit repeatedly.  Have trouble breathing. Summary  Hypertension is when the force of blood pumping through your arteries is too strong. If this condition is not controlled, it may put you at risk for serious complications.  Your personal target blood pressure may vary depending on your medical conditions, your age, and other factors. For most people, a normal blood pressure is less than 120/80.  Hypertension is treated with lifestyle changes, medicines, or a combination of both. Lifestyle changes include losing weight, eating a healthy, low-sodium diet, exercising more, and limiting alcohol. This information is not intended to replace advice given to you by your health care provider. Make sure you discuss any questions you have with your health care provider. Document Revised: 04/29/2018 Document Reviewed: 04/29/2018 Elsevier Patient Education  2020 [employer].

## 2020-09-05 NOTE — Telephone Encounter (Signed)
Please advise 

## 2020-09-05 NOTE — Telephone Encounter (Signed)
I am ok with both but I also need to see her sister Linda Briggs in clinic to discuss her CT results.  This is urgent and no one has scheduled the follow up.

## 2020-09-05 NOTE — Progress Notes (Signed)
  Subjective:  Patient ID: Linda Briggs, female    DOB: Jun 18, 1956,  MRN: [id number]  Chief Complaint  Patient presents with  . Foot Ulcer    "I think its better.  It calloused over"    65 y.o. female presents with the above complaint. History confirmed with patient.  She was concerned because she is diabetic.  She completed her vascular testing.  She has been applying mupirocin ointment and a bandage daily.  She completed the Bactrim course  Objective:  Physical Exam: warm, good capillary refill, no trophic changes or ulcerative lesions and normal DP and PT pulses.  Abnormal monofilament exam with loss of protective sensation  Right Foot: There is a full-thickness ulceration over the dorsal lateral PIPJ of the fifth toe, improved in dimensions now measuring 0.7 cm in width and 0.4 cm in length and 0.2 cm in depth.  Mild erythema of the fourth and fifth toes to the level of the MTPJ.  No gross purulence.  No exposed bone tendon or capsule.      Radiographs: X-ray of the right foot: no fracture, dislocation, swelling or degenerative changes noted and no soft tissue emphysema, no signs of osteomyelitis Assessment:   No diagnosis found.   Plan:  Patient was evaluated and treated and all questions answered.  Patient educated on diabetes. Discussed proper diabetic foot care and discussed risks and complications of disease. Educated patient in depth on reasons to return to the office immediately should he/she discover anything concerning or new on the feet. All questions answered. Discussed proper shoes as well.   Ulcer right fifth toe -Debridement as below. -Dressed with Iodosorb, DSD. -Continue off-loading with surgical shoe.   Procedure: Excisional Debridement of Wound Rationale: Removal of non-viable soft tissue from the wound to promote healing.  Anesthesia: none Pre-Debridement Wound Measurements: 0.7 x 0.4 x 0.2 Post-Debridement Wound Measurements: Same as  predebridement Type of Debridement: Sharp selective Tissue Removed: Non-viable soft tissue Depth of Debridement: subcutaneous tissue. Technique: Sharp selective debridement to bleeding, viable wound base.  Dressing: Dry, sterile, compression dressing. Disposition: Patient tolerated procedure well.        Return in about 3 weeks (around 09/25/2020).

## 2020-09-08 ENCOUNTER — Encounter: Payer: Self-pay | Admitting: Physician Assistant

## 2020-09-08 ENCOUNTER — Ambulatory Visit: Payer: Medicare HMO | Admitting: Physician Assistant

## 2020-09-08 ENCOUNTER — Other Ambulatory Visit: Payer: Self-pay

## 2020-09-08 VITALS — BP 134/62 | HR 77 | Ht 66.0 in | Wt 303.0 lb

## 2020-09-08 DIAGNOSIS — I890 Lymphedema, not elsewhere classified: Secondary | ICD-10-CM | POA: Diagnosis not present

## 2020-09-08 DIAGNOSIS — R0609 Other forms of dyspnea: Secondary | ICD-10-CM

## 2020-09-08 DIAGNOSIS — R000 Tachycardia, unspecified: Secondary | ICD-10-CM | POA: Diagnosis not present

## 2020-09-08 DIAGNOSIS — I10 Essential (primary) hypertension: Secondary | ICD-10-CM | POA: Diagnosis not present

## 2020-09-08 DIAGNOSIS — E782 Mixed hyperlipidemia: Secondary | ICD-10-CM

## 2020-09-08 DIAGNOSIS — R0600 Dyspnea, unspecified: Secondary | ICD-10-CM | POA: Diagnosis not present

## 2020-09-08 NOTE — Patient Instructions (Signed)
Medication Instructions:  No changes  *If you need a refill on your cardiac medications before your next appointment, please call your pharmacy*   Lab Work: None  If you have labs (blood work) drawn today and your tests are completely normal, you will receive your results only by: Marland Kitchen MyChart Message (if you have MyChart) OR . A paper copy in the mail If you have any lab test that is abnormal or we need to change your treatment, we will call you to review the results.   Testing/Procedures: None   Follow-Up: At [facility], you and your health needs are our priority.  As part of our continuing mission to provide you with exceptional heart care, we have created designated Provider Care Teams.  These Care Teams include your primary Cardiologist (physician) and Advanced Practice Providers (APPs -  Physician Assistants and Nurse Practitioners) who all work together to provide you with the care you need, when you need it.  We recommend signing up for the patient portal called "MyChart".  Sign up information is provided on this After Visit Summary.  MyChart is used to connect with patients for Virtual Visits (Telemedicine).  Patients are able to view lab/test results, encounter notes, upcoming appointments, etc.  Non-urgent messages can be sent to your provider as well.   To learn more about what you can do with MyChart, go to [email or web address].    Your next appointment:   6 month(s)  The format for your next appointment:   In Person  Provider:   Julien Nordmann, MD   Other Instructions Referral made to pulmonary rehab. They will review with your insurance and then reach out to schedule appointment. Sometimes this process can take a few weeks but if you don't hear anything after 3 weeks you can call [phone]

## 2020-09-10 NOTE — Progress Notes (Signed)
Subjective:    Patient ID: Linda Briggs, female    DOB: 07/25/1956, 65 y.o.   MRN: [id number] Chief Complaint  Patient presents with  . Follow-up    Ultrasound follow up    Linda SalinesCynthia K Yonts is a 65 y.o. female.  Presents today for evaluation of lower extremity edema.  The patient has had problems with lower extremity edema for years.  Over the last several years it is gotten worse due to the fact that she is gained weight in addition to not being as active as possible due to breathing and back issues.  She notes that she was started on Actos and this made her swelling become much worse and when she stopped Actos the swelling greatly decreased.  She notes that she does have weeping and bloody drainage from her legs however currently she has not had any recently.  She also has a nonhealing ulceration on her right foot that is currently being managed by podiatry.  She denies any fever, chills, nausea, vomiting or diarrhea.  The patient has worn compression in the past however it is difficult when she is at her most swollen.  Today noninvasive study showed no evidence of DVT or superficial venous thrombosis bilaterally.  There is evidence of reflux in the great saphenous vein at the proximal calf on the right lower extremity with reflux in the great saphenous vein at the proximal thigh and mid thigh on the left.  The patient has no evidence of deep venous insufficiency seen bilaterally.  Today the patient also underwent bilateral arterial studies.  The studies are noncompressible bilaterally.  The patient has a TBI 0.94 on the right and 1.03 on the left.  The patient has strong triphasic tibial artery waveforms bilaterally.    Review of Systems  Cardiovascular: Positive for leg swelling.  Skin: Positive for wound.  All other systems reviewed and are negative.      Objective:   Physical Exam Vitals reviewed.  HENT:     Head: Normocephalic.  Cardiovascular:     Rate and Rhythm: Normal  rate.     Pulses: Normal pulses.  Pulmonary:     Effort: Pulmonary effort is normal.  Skin:    General: Skin is warm and dry.  Neurological:     Mental Status: She is alert and oriented to person, place, and time.  Psychiatric:        Mood and Affect: Mood normal.        Behavior: Behavior normal.        Thought Content: Thought content normal.        Judgment: Judgment normal.     BP 118/73 (BP Location: Right Arm)   Pulse 78   Resp 16   Wt (!) 302 lb (137 kg)   BMI 48.74 kg/m   Past Medical History:  Diagnosis Date  . Arthritis    feet & toes  . Bulging disc   . Diabetes mellitus   . Gastroparesis   . History of hepatitis B   . Hyperlipidemia   . Hypertension   . Neuromuscular disorder (HCC)    in feet and fingers   . Neuropathy    feet  . Nonproliferative diabetic retinopathy (HCC)   . 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 Peripheral vascular disease (HCC)   . Shortness of breath dyspnea    on lopressor   . Sleep apnea    pt had study done but it was  incomplete due to blood sugar issues     Social History   Socioeconomic History  . Marital status: Single    Spouse name: Not on file  . Number of children: 0  . Years of education: Not on file  . Highest education level: Not on file  Occupational History  . Occupation: retired  Tobacco Use  . Smoking status: Never Smoker  . Smokeless tobacco: Never Used  Vaping Use  . Vaping Use: Never used  Substance and Sexual Activity  . Alcohol use: No    Comment: social drinker.  . Drug use: No  . Sexual activity: Not on file  Other Topics Concern  . Not on file  Social History Narrative  . Not on file   Social Determinants of Health   Financial Resource Strain: Low Risk   . Difficulty of Paying Living Expenses: Not very hard  Food Insecurity: Not on file  Transportation Needs: Not on file  Physical Activity: Not on file  Stress: Not on file  Social Connections: Not on file  Intimate Partner  Violence: Not on file    Past Surgical History:  Procedure Laterality Date  . BIOPSY  04/28/2018   Procedure: BIOPSY;  Surgeon: Meryl Dare, MD;  Location: [facility];  Service: Endoscopy;;  . BIOPSY THYROID     january 2016  . ESOPHAGOGASTRODUODENOSCOPY (EGD) WITH PROPOFOL N/A 04/28/2018   Procedure: ESOPHAGOGASTRODUODENOSCOPY (EGD) WITH PROPOFOL;  Surgeon: Meryl Dare, MD;  Location: [facility];  Service: Endoscopy;  Laterality: N/A;  . HYSTEROSCOPY WITH D & C N/A 02/15/2015   Procedure: DILATATION AND CURETTAGE /HYSTEROSCOPY;  Surgeon: Mitchel Honour, DO;  Location: [facility];  Service: Gynecology;  Laterality: N/A;  . TONSILLECTOMY      Family History  Problem Relation Age of Onset  . Hyperlipidemia Mother   . Heart disease Father   . Hyperlipidemia Father   . Hypertension Father   . Heart disease Brother   . Kidney disease Sister   . Kidney cancer Sister   . Ovarian cancer Maternal Aunt   . Kidney disease Maternal Aunt        x 2  . Kidney disease Maternal Uncle        x 2  . Stroke Brother   . Heart disease Brother   . Colon cancer Neg Hx   . Stomach cancer Neg Hx     Allergies  Allergen Reactions  . Codeine Anaphylaxis and Hives  . Penicillins Anaphylaxis, Hives and Other (See Comments)    Has patient had a PCN reaction causing immediate rash, facial/tongue/throat swelling, SOB or lightheadedness with hypotension: No Has patient had a PCN reaction causing severe rash involving mucus membranes or skin necrosis: No Has patient had a PCN reaction that required hospitalization: No Has patient had a PCN reaction occurring within the last 10 years: No If all of the above answers are "NO", then may proceed with Cephalosporin use.   . Ciprofloxacin Other (See Comments)    Tongue Swelling  . Food Nausea And Vomiting and Other (See Comments)    Olives   . Januvia [Sitagliptin] Other (See Comments)    Stomach cramps  . Latex Hives    CBC Latest Ref Rng & Units  08/05/2020 03/20/2020 12/15/2019  WBC 4.0 - 10.5 K/uL 11.9(H) 10.4 9.0  Hemoglobin 12.0 - 15.0 g/dL 16.1 09.6 04.5  Hematocrit 36.0 - 46.0 % 45.1 44.5 39.3  Platelets 150 - 400 K/uL 184 135(L) 126(L)  CMP     Component Value Date/Time   NA 137 08/05/2020 1752   K 4.0 08/05/2020 1752   CL 100 08/05/2020 1752   CO2 22 08/05/2020 1752   GLUCOSE 102 (H) 08/05/2020 1752   BUN 17 08/05/2020 1752   CREATININE 0.87 08/05/2020 1752   CREATININE 1.17 (H) 03/20/2020 1146   CALCIUM 9.2 08/05/2020 1752   PROT 7.0 03/20/2020 1146   ALBUMIN 3.9 02/10/2017 1217   AST 47 (H) 03/20/2020 1146   ALT 43 (H) 03/20/2020 1146   ALKPHOS 69 02/10/2017 1217   BILITOT 0.8 03/20/2020 1146   GFRNONAA >60 08/05/2020 1752   GFRNONAA 50 (L) 03/20/2020 1146   GFRAA 58 (L) 03/20/2020 1146     VAS [place] ABI WITH/WO TBI  Result Date: 09/05/2020 LOWER EXTREMITY DOPPLER STUDY Indications: Severe cellulitis moderate swelling.  Performing Technologist: Salvadore Farber RVT  Examination Guidelines: A complete evaluation includes at minimum, Doppler waveform signals and systolic blood pressure reading at the level of bilateral brachial, anterior tibial, and posterior tibial arteries, when vessel segments are accessible. Bilateral testing is considered an integral part of a complete examination. Photoelectric Plethysmograph (PPG) waveforms and toe systolic pressure readings are included as required and additional duplex testing as needed. Limited examinations for reoccurring indications may be performed as noted.  ABI Findings: +---------+------------------+-----+---------+--------+ Right    Rt Pressure (mmHg)IndexWaveform Comment  +---------+------------------+-----+---------+--------+ Brachial 118                                      +---------+------------------+-----+---------+--------+ ATA                             triphasicNC       +---------+------------------+-----+---------+--------+ PTA                              triphasicNC       +---------+------------------+-----+---------+--------+ Great Toe111               0.94 Normal            +---------+------------------+-----+---------+--------+ +---------+------------------+-----+---------+-------+ Left     Lt Pressure (mmHg)IndexWaveform Comment +---------+------------------+-----+---------+-------+ Brachial 118                                     +---------+------------------+-----+---------+-------+ ATA                             triphasicNC      +---------+------------------+-----+---------+-------+ PTA                             triphasicNC      +---------+------------------+-----+---------+-------+ Great Toe121               1.03 Normal           +---------+------------------+-----+---------+-------+  Summary: Right: Resting right ankle-brachial index indicates noncompressible right lower extremity arteries. The right toe-brachial index is normal. Non-compressible waveforms due to swelling. Triphasic flow identified. Left: Resting left ankle-brachial index indicates noncompressible left lower extremity arteries. The left toe-brachial index is normal. Non-compressible waveforms due to swelling. Triphasic flow identified.  *See table(s) above for measurements and observations.  Electronically signed by Festus Barren  MD on 09/05/2020 at 8:46:42 AM.   Final        Assessment & Plan:   1. Lymphedema Recommend:  No surgery or intervention at this point in time.    I have reviewed my previous discussion with the patient regarding swelling and why it causes symptoms.  Patient will continue wearing graduated compression stockings class 1 (20-30 mmHg) on a daily basis. The patient will  beginning wearing the stockings first thing in the morning and removing them in the evening. The patient is instructed specifically not to sleep in the stockings.    In addition, behavioral modification including several periods of  elevation of the lower extremities during the day will be continued.  This was reviewed with the patient during the initial visit.  The patient will also continue routine exercise, especially walking on a daily basis as was discussed during the initial visit.    Despite conservative treatments of at least four weeks, including graduated compression therapy class 1 and behavioral modification including exercise and elevation the patient  has not obtained adequate control of the lymphedema.  The patient still has stage 3 lymphedema and therefore, I believe that a lymph pump should be added to improve the control of the patient's lymphedema.  Additionally, a lymph pump is warranted because it will reduce the risk of cellulitis and ulceration in the future.  Patient should follow-up in six months    2. Ulcerated, foot, right, with fat layer exposed (HCC) Noninvasive studies today show adequate perfusion for wound healing.  No role for vascular intervention that would improve patient's wound healing.  3. Pure hypercholesterolemia Continue statin as ordered and reviewed, no changes at this time   4. Essential hypertension Continue antihypertensive medications as already ordered, these medications have been reviewed and there are no changes at this time.    Current Outpatient Medications on File Prior to Visit  Medication Sig Dispense Refill  . Alcohol Swabs (B-D SINGLE USE SWABS REGULAR) PADS Use to monitor FSBS 3x daily for fluctuating FSBS. DX: 250.00 300 each 1  . aspirin EC 81 MG tablet Take 81 mg by mouth daily.    . Blood Glucose Calibration (TRUE METRIX LEVEL 2) Normal SOLN Use as directed to monitor FSBS 3x daily. Dx: E11.65 1 each 1  . Blood Glucose Monitoring Suppl (TRUE METRIX AIR GLUCOSE METER) DEVI 1 each by Does not apply route daily. Use as directed to monitor FSBS 3x daily. Dx: E11.65 1 each 5  . FARXIGA 10 MG TABS tablet TAKE 1 TABLET EVERY DAY BEFORE BREAKFAST 90 tablet 3  .  furosemide (LASIX) 20 MG tablet TAKE 1 TABLET EVERY DAY 90 tablet 0  . gabapentin (NEURONTIN) 300 MG capsule TAKE 1 CAPSULE THREE TIMES DAILY 270 capsule 1  . glipiZIDE (GLUCOTROL XL) 5 MG 24 hr tablet TAKE 1 TABLET  DAILY WITH BREAKFAST. 90 tablet 1  . glucose blood (TRUE METRIX BLOOD GLUCOSE TEST) test strip Use as directed to monitor FSBS 3x daily. Dx: E11.65 300 each 3  . lisinopril (ZESTRIL) 5 MG tablet Take 1 tablet (5 mg total) by mouth daily. 90 tablet 3  . metFORMIN (GLUCOPHAGE) 1000 MG tablet TAKE 1 TABLET TWICE DAILY WITH A MEAL 180 tablet 1  . metoprolol tartrate (LOPRESSOR) 50 MG tablet Take 0.5 tablet (25MG ) by mouth Twice a day    . Multiple Vitamin (MULTIVITAMIN) tablet Take 1 tablet by mouth daily.    . mupirocin ointment (BACTROBAN) 2 % Apply 1 application  topically 2 (two) times daily. 30 g 2  . potassium chloride (KLOR-CON) 10 MEQ tablet TAKE 1 TABLET EVERY DAY 90 tablet 1  . pravastatin (PRAVACHOL) 40 MG tablet TAKE 1 TABLET EVERY DAY 90 tablet 3  . traMADol (ULTRAM) 50 MG tablet TAKE 1 TABLET BY MOUTH EVERY 6 HOURS AS NEEDED FOR  CHRONIC  BACK  PAIN 60 tablet 0  . TRUEplus Lancets 30G MISC Use as directed to monitor FSBS 3x daily. Dx: E11.65 200 each 3  . TRULICITY 1.5 MG/0.5ML SOPN INJECT 1.5MG  (1 PEN) SUBCUTANEOUSLY EVERY WEEK AS DIRECTED 12 mL 1  . diclofenac (FLECTOR) 1.3 % PTCH Place 1 patch onto the skin 2 (two) times daily. 60 patch 0  . pioglitazone (ACTOS) 30 MG tablet TAKE 1 TABLET EVERY DAY 90 tablet 1   No current facility-administered medications on file prior to visit.    There are no Patient Instructions on file for this visit. No follow-ups on file.   Georgiana SpinnerFallon E Annais Crafts, NP

## 2020-09-11 ENCOUNTER — Encounter (INDEPENDENT_AMBULATORY_CARE_PROVIDER_SITE_OTHER): Payer: Self-pay | Admitting: Nurse Practitioner

## 2020-09-11 ENCOUNTER — Other Ambulatory Visit: Payer: Self-pay | Admitting: *Deleted

## 2020-09-11 DIAGNOSIS — R0600 Dyspnea, unspecified: Secondary | ICD-10-CM

## 2020-09-11 DIAGNOSIS — R0609 Other forms of dyspnea: Secondary | ICD-10-CM

## 2020-09-25 ENCOUNTER — Ambulatory Visit (INDEPENDENT_AMBULATORY_CARE_PROVIDER_SITE_OTHER): Payer: Medicare HMO | Admitting: Family Medicine

## 2020-09-25 ENCOUNTER — Other Ambulatory Visit: Payer: Self-pay

## 2020-09-25 ENCOUNTER — Ambulatory Visit: Payer: Medicare HMO | Admitting: Podiatry

## 2020-09-25 VITALS — BP 136/74 | HR 85 | Temp 98.0°F | Resp 18 | Wt 302.0 lb

## 2020-09-25 DIAGNOSIS — I10 Essential (primary) hypertension: Secondary | ICD-10-CM

## 2020-09-25 DIAGNOSIS — E7800 Pure hypercholesterolemia, unspecified: Secondary | ICD-10-CM | POA: Diagnosis not present

## 2020-09-25 DIAGNOSIS — E118 Type 2 diabetes mellitus with unspecified complications: Secondary | ICD-10-CM | POA: Diagnosis not present

## 2020-09-25 NOTE — Progress Notes (Signed)
Subjective:    Patient ID: Linda Briggs, female    DOB: Jul 06, 1956, 65 y.o.   MRN: [id number] Since I last saw the patient, she had to stop Actos due to weeping ulcers in her legs from her lymphedema.  However she has been checking her sugars and she states that her sugars are typically between 110 and 120 in the mornings.  The lowest she seen has been 87.  They are typically under 180 in the afternoons.  She has not had any random sugar over 200.  She denies any hypoglycemia.  She does have numbness in her feet which is chronic.  She has weakly palpable pulses in the dorsalis pedis and posterior tibialis bilaterally.  She has her chronic fibrous changes to the skin due to her severe lymphedema with discoloration and hyperpigmentation of the skin and subcutaneous nodularity and fibrosis.  However these are all chronic findings.  Her blood pressure today is well controlled.  She denies any chest pain shortness of breath or dyspnea on exertion.  Past Medical History:  Diagnosis Date  . Arthritis    feet & toes  . Bulging disc   . Diabetes mellitus   . Gastroparesis   . History of hepatitis B   . Hyperlipidemia   . Hypertension   . Neuromuscular disorder (HCC)    in feet and fingers   . Neuropathy    feet  . Nonproliferative diabetic retinopathy (HCC)   . 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 Peripheral vascular disease (HCC)   . Shortness of breath dyspnea    on lopressor   . Sleep apnea    pt had study done but it was incomplete due to blood sugar issues    Past Surgical History:  Procedure Laterality Date  . BIOPSY  04/28/2018   Procedure: BIOPSY;  Surgeon: Meryl Dare, MD;  Location: [facility];  Service: Endoscopy;;  . BIOPSY THYROID     january 2016  . ESOPHAGOGASTRODUODENOSCOPY (EGD) WITH PROPOFOL N/A 04/28/2018   Procedure: ESOPHAGOGASTRODUODENOSCOPY (EGD) WITH PROPOFOL;  Surgeon: Meryl Dare, MD;  Location: [facility];  Service: Endoscopy;  Laterality:  N/A;  . HYSTEROSCOPY WITH D & C N/A 02/15/2015   Procedure: DILATATION AND CURETTAGE /HYSTEROSCOPY;  Surgeon: Mitchel Honour, DO;  Location: [facility];  Service: Gynecology;  Laterality: N/A;  . TONSILLECTOMY     Current Outpatient Medications on File Prior to Visit  Medication Sig Dispense Refill  . Alcohol Swabs (B-D SINGLE USE SWABS REGULAR) PADS Use to monitor FSBS 3x daily for fluctuating FSBS. DX: 250.00 300 each 1  . aspirin EC 81 MG tablet Take 81 mg by mouth daily.    . Blood Glucose Calibration (TRUE METRIX LEVEL 2) Normal SOLN Use as directed to monitor FSBS 3x daily. Dx: E11.65 1 each 1  . Blood Glucose Monitoring Suppl (TRUE METRIX AIR GLUCOSE METER) DEVI 1 each by Does not apply route daily. Use as directed to monitor FSBS 3x daily. Dx: E11.65 1 each 5  . diclofenac (FLECTOR) 1.3 % PTCH Place 1 patch onto the skin 2 (two) times daily. 60 patch 0  . FARXIGA 10 MG TABS tablet TAKE 1 TABLET EVERY DAY BEFORE BREAKFAST 90 tablet 3  . furosemide (LASIX) 20 MG tablet TAKE 1 TABLET EVERY DAY 90 tablet 0  . gabapentin (NEURONTIN) 300 MG capsule TAKE 1 CAPSULE THREE TIMES DAILY 270 capsule 1  . glipiZIDE (GLUCOTROL XL) 5 MG 24 hr tablet TAKE  1 TABLET  DAILY WITH BREAKFAST. 90 tablet 1  . glucose blood (TRUE METRIX BLOOD GLUCOSE TEST) test strip Use as directed to monitor FSBS 3x daily. Dx: E11.65 300 each 3  . lisinopril (ZESTRIL) 5 MG tablet Take 1 tablet (5 mg total) by mouth daily. 90 tablet 3  . metFORMIN (GLUCOPHAGE) 1000 MG tablet TAKE 1 TABLET TWICE DAILY WITH A MEAL 180 tablet 1  . metoprolol tartrate (LOPRESSOR) 50 MG tablet Take 0.5 tablet (25MG ) by mouth Twice a day    . Multiple Vitamin (MULTIVITAMIN) tablet Take 1 tablet by mouth daily.    . mupirocin ointment (BACTROBAN) 2 % Apply 1 application topically 2 (two) times daily. 30 g 2  . ondansetron (ZOFRAN) 4 MG tablet Take 1 tablet (4 mg total) by mouth every 8 (eight) hours as needed for nausea or vomiting. 60 tablet 3  .  pantoprazole (PROTONIX) 40 MG tablet Take 1 tablet (40 mg total) by mouth 2 (two) times daily before a meal. 180 tablet 2  . pioglitazone (ACTOS) 30 MG tablet TAKE 1 TABLET EVERY DAY 90 tablet 1  . potassium chloride (KLOR-CON) 10 MEQ tablet TAKE 1 TABLET EVERY DAY 90 tablet 1  . pravastatin (PRAVACHOL) 40 MG tablet TAKE 1 TABLET EVERY DAY 90 tablet 3  . traMADol (ULTRAM) 50 MG tablet TAKE 1 TABLET BY MOUTH EVERY 6 HOURS AS NEEDED FOR  CHRONIC  BACK  PAIN 60 tablet 0  . TRUEplus Lancets 30G MISC Use as directed to monitor FSBS 3x daily. Dx: E11.65 200 each 3  . TRULICITY 1.5 MG/0.5ML SOPN INJECT 1.5MG  (1 PEN) SUBCUTANEOUSLY EVERY WEEK AS DIRECTED 12 mL 1   No current facility-administered medications on file prior to visit.   Allergies  Allergen Reactions  . Codeine Anaphylaxis and Hives  . Penicillins Anaphylaxis, Hives and Other (See Comments)    Has patient had a PCN reaction causing immediate rash, facial/tongue/throat swelling, SOB or lightheadedness with hypotension: No Has patient had a PCN reaction causing severe rash involving mucus membranes or skin necrosis: No Has patient had a PCN reaction that required hospitalization: No Has patient had a PCN reaction occurring within the last 10 years: No If all of the above answers are "NO", then may proceed with Cephalosporin use.   . Ciprofloxacin Other (See Comments)    Tongue Swelling  . Food Nausea And Vomiting and Other (See Comments)    Olives   . Januvia [Sitagliptin] Other (See Comments)    Stomach cramps  . Latex Hives   Social History   Socioeconomic History  . Marital status: Single    Spouse name: Not on file  . Number of children: 0  . Years of education: Not on file  . Highest education level: Not on file  Occupational History  . Occupation: retired  Tobacco Use  . Smoking status: Never Smoker  . Smokeless tobacco: Never Used  Vaping Use  . Vaping Use: Never used  Substance and Sexual Activity  . Alcohol  use: No    Comment: social drinker.  . Drug use: No  . Sexual activity: Not on file  Other Topics Concern  . Not on file  Social History Narrative  . Not on file   Social Determinants of Health   Financial Resource Strain: Low Risk   . Difficulty of Paying Living Expenses: Not very hard  Food Insecurity: Not on file  Transportation Needs: Not on file  Physical Activity: Not on file  Stress: Not on  file  Social Connections: Not on file  Intimate Partner Violence: Not on file   Family History  Problem Relation Age of Onset  . Hyperlipidemia Mother   . Heart disease Father   . Hyperlipidemia Father   . Hypertension Father   . Heart disease Brother   . Kidney disease Sister   . Kidney cancer Sister   . Ovarian cancer Maternal Aunt   . Kidney disease Maternal Aunt        x 2  . Kidney disease Maternal Uncle        x 2  . Stroke Brother   . Heart disease Brother   . Colon cancer Neg Hx   . Stomach cancer Neg Hx       Review of Systems  All other systems reviewed and are negative.      Objective:   Physical Exam Vitals reviewed.  Constitutional:      General: She is not in acute distress.    Appearance: She is well-developed. She is not diaphoretic.  HENT:     Head: Normocephalic and atraumatic.     Right Ear: External ear normal.     Left Ear: External ear normal.     Nose: Nose normal.     Mouth/Throat:     Pharynx: No oropharyngeal exudate.  Eyes:     General: No scleral icterus.       Right eye: No discharge.        Left eye: No discharge.     Conjunctiva/sclera: Conjunctivae normal.     Pupils: Pupils are equal, round, and reactive to light.  Neck:     Thyroid: No thyromegaly.     Vascular: No JVD.     Trachea: No tracheal deviation.  Cardiovascular:     Rate and Rhythm: Normal rate and regular rhythm.     Heart sounds: Normal heart sounds. No murmur heard. No friction rub. No gallop.   Pulmonary:     Effort: Pulmonary effort is normal. No  respiratory distress.     Breath sounds: Normal breath sounds. No stridor. No wheezing or rales.  Chest:     Chest wall: No tenderness.  Abdominal:     General: Bowel sounds are normal. There is no distension.     Palpations: Abdomen is soft. There is no mass.     Tenderness: There is no abdominal tenderness. There is no guarding or rebound.  Musculoskeletal:        General: No tenderness. Normal range of motion.     Cervical back: Normal range of motion and neck supple.  Lymphadenopathy:     Cervical: No cervical adenopathy.  Skin:    General: Skin is warm.     Coloration: Skin is not pale.     Findings: No erythema or rash.  Neurological:     Mental Status: She is alert and oriented to person, place, and time.     Cranial Nerves: No cranial nerve deficit.     Motor: No abnormal muscle tone.     Coordination: Coordination normal.     Deep Tendon Reflexes: Reflexes are normal and symmetric.  Psychiatric:        Behavior: Behavior normal.        Thought Content: Thought content normal.        Judgment: Judgment normal.      Wt Readings from Last 3 Encounters:  09/25/20 (!) 302 lb (137 kg)  09/08/20 (!) 303 lb (137.4 kg)  08/28/20 Marland Kitchen)  302 lb (137 kg)        Assessment & Plan:   Type 2 diabetes mellitus with complications (HCC) - Plan: Hemoglobin A1c, CBC with Differential/Platelet, COMPLETE METABOLIC PANEL WITH GFR, Lipid panel, Microalbumin, urine  Pure hypercholesterolemia  Morbid obesity (HCC)  Essential hypertension  Blood pressures well controlled.  Check fasting lipid panel.  Goal LDL cholesterol is less than 100.  Check urine microalbumin.  Goal albumin to creatinine ratio is less than 30.  Check A1c.  Goal A1c is less than 7.  Spent more than 15 minutes today with the patient trying to encourage her to get the Covid vaccine because she is extremely high risk if she were to catch Covid.  Patient is very hesitant and declines a Covid shot.  Strongly encouraged her  to reconsider

## 2020-09-26 LAB — MICROALBUMIN, URINE: Microalb, Ur: 0.7 mg/dL

## 2020-09-26 LAB — COMPLETE METABOLIC PANEL WITH GFR
AG Ratio: 1.6 (calc) (ref 1.0–2.5)
ALT: 20 U/L (ref 6–29)
AST: 23 U/L (ref 10–35)
Albumin: 4 g/dL (ref 3.6–5.1)
Alkaline phosphatase (APISO): 64 U/L (ref 37–153)
BUN/Creatinine Ratio: 15 (calc) (ref 6–22)
BUN: 15 mg/dL (ref 7–25)
CO2: 24 mmol/L (ref 20–32)
Calcium: 9.3 mg/dL (ref 8.6–10.4)
Chloride: 104 mmol/L (ref 98–110)
Creat: 1.01 mg/dL — ABNORMAL HIGH (ref 0.50–0.99)
GFR, Est African American: 69 mL/min/{1.73_m2} (ref >=60)
GFR, Est Non African American: 59 mL/min/{1.73_m2} — ABNORMAL LOW (ref >=60)
Globulin: 2.5 g/dL (calc) (ref 1.9–3.7)
Glucose, Bld: 143 mg/dL — ABNORMAL HIGH (ref 65–99)
Potassium: 4.3 mmol/L (ref 3.5–5.3)
Sodium: 139 mmol/L (ref 135–146)
Total Bilirubin: 0.6 mg/dL (ref 0.2–1.2)
Total Protein: 6.5 g/dL (ref 6.1–8.1)

## 2020-09-26 LAB — CBC WITH DIFFERENTIAL/PLATELET
Absolute Monocytes: 412 cells/uL (ref 200–950)
Basophils Absolute: 72 cells/uL (ref 0–200)
Basophils Relative: 0.7 %
Eosinophils Absolute: 134 cells/uL (ref 15–500)
Eosinophils Relative: 1.3 %
HCT: 42.3 % (ref 35.0–45.0)
Hemoglobin: 14.3 g/dL (ref 11.7–15.5)
Lymphs Abs: 2029 cells/uL (ref 850–3900)
MCH: 30.6 pg (ref 27.0–33.0)
MCHC: 33.8 g/dL (ref 32.0–36.0)
MCV: 90.6 fL (ref 80.0–100.0)
MPV: 10.4 fL (ref 7.5–12.5)
Monocytes Relative: 4 %
Neutro Abs: 7653 cells/uL (ref 1500–7800)
Neutrophils Relative %: 74.3 %
Platelets: 144 10*3/uL (ref 140–400)
RBC: 4.67 10*6/uL (ref 3.80–5.10)
RDW: 13.5 % (ref 11.0–15.0)
Total Lymphocyte: 19.7 %
WBC: 10.3 10*3/uL (ref 3.8–10.8)

## 2020-09-26 LAB — LIPID PANEL
Cholesterol: 153 mg/dL (ref <200)
HDL: 30 mg/dL — ABNORMAL LOW (ref >=50)
LDL Cholesterol (Calc): 98 mg/dL (calc)
Non-HDL Cholesterol (Calc): 123 mg/dL (calc) (ref <130)
Total CHOL/HDL Ratio: 5.1 (calc) — ABNORMAL HIGH (ref <5.0)
Triglycerides: 155 mg/dL — ABNORMAL HIGH (ref <150)

## 2020-09-26 LAB — HEMOGLOBIN A1C
Hgb A1c MFr Bld: 6.8 % of total Hgb — ABNORMAL HIGH (ref <5.7)
Mean Plasma Glucose: 148 mg/dL
eAG (mmol/L): 8.2 mmol/L

## 2020-09-27 ENCOUNTER — Other Ambulatory Visit: Payer: Self-pay

## 2020-09-27 ENCOUNTER — Encounter: Payer: Self-pay | Admitting: Podiatry

## 2020-09-27 ENCOUNTER — Ambulatory Visit (INDEPENDENT_AMBULATORY_CARE_PROVIDER_SITE_OTHER): Payer: Medicare HMO | Admitting: Podiatry

## 2020-09-27 DIAGNOSIS — E11621 Type 2 diabetes mellitus with foot ulcer: Secondary | ICD-10-CM

## 2020-09-27 DIAGNOSIS — L97512 Non-pressure chronic ulcer of other part of right foot with fat layer exposed: Secondary | ICD-10-CM

## 2020-09-27 DIAGNOSIS — M2041 Other hammer toe(s) (acquired), right foot: Secondary | ICD-10-CM

## 2020-09-27 DIAGNOSIS — E1142 Type 2 diabetes mellitus with diabetic polyneuropathy: Secondary | ICD-10-CM

## 2020-09-27 MED ORDER — MUPIROCIN 2 % EX OINT
1.0000 | TOPICAL_OINTMENT | Freq: Two times a day (BID) | CUTANEOUS | 2 refills | Status: DC
Start: 2020-09-27 — End: 2020-12-11

## 2020-09-28 ENCOUNTER — Other Ambulatory Visit: Payer: Self-pay | Admitting: Family Medicine

## 2020-09-28 ENCOUNTER — Encounter: Payer: Self-pay | Admitting: Podiatry

## 2020-09-28 NOTE — Progress Notes (Signed)
  Subjective:  Patient ID: Linda Briggs, female    DOB: [birth year],  MRN: [id number]  Chief Complaint  Patient presents with  . Foot Ulcer    "I think its better"    65 y.o. female returns with the above complaint. History confirmed with patient.    She has been applying mupirocin ointment and a bandage daily.  Feels like it has been improving  Objective:  Physical Exam: warm, good capillary refill, no trophic changes or ulcerative lesions and normal DP and PT pulses.  Abnormal monofilament exam with loss of protective sensation  Right Foot: There is a full-thickness ulceration over the dorsal lateral PIPJ of the fifth toe, again improved in dimensions now measuring 0.5 cm in width and 0.4 cm in length and 0.1 cm in depth.  Minimal erythema of the fourth and fifth toes to the level of the MTPJ.  No gross purulence.  No exposed bone tendon or capsule.        Radiographs: X-ray of the right foot: no fracture, dislocation, swelling or degenerative changes noted and no soft tissue emphysema, no signs of osteomyelitis Assessment:   No diagnosis found.   Plan:  Patient was evaluated and treated and all questions answered.  Patient educated on diabetes. Discussed proper diabetic foot care and discussed risks and complications of disease. Educated patient in depth on reasons to return to the office immediately should he/she discover anything concerning or new on the feet. All questions answered. Discussed proper shoes as well.   Ulcer right fifth toe -Debridement as below. -Dressed with Iodosorb, DSD. -Continue off-loading with surgical shoe. -Continue daily dressings with mupirocin  Procedure: Excisional Debridement of Wound Rationale: Removal of non-viable soft tissue from the wound to promote healing.  Anesthesia: none Pre-Debridement Wound Measurements: 0.5 x 0.4 x 0.1 Post-Debridement Wound Measurements: Same as predebridement Type of Debridement: Sharp selective Tissue  Removed: Non-viable soft tissue Depth of Debridement: subcutaneous tissue. Technique: Sharp selective debridement to bleeding, viable wound base.  Dressing: Dry, sterile, compression dressing. Disposition: Patient tolerated procedure well.        Return in about 3 weeks (around 10/18/2020) for wound re-check right 5th toe.

## 2020-10-09 ENCOUNTER — Other Ambulatory Visit: Payer: Self-pay | Admitting: Family Medicine

## 2020-10-18 ENCOUNTER — Other Ambulatory Visit: Payer: Self-pay

## 2020-10-18 ENCOUNTER — Ambulatory Visit: Payer: Medicare HMO | Admitting: Orthotics

## 2020-10-18 ENCOUNTER — Ambulatory Visit: Payer: Medicare HMO | Admitting: Podiatry

## 2020-10-18 ENCOUNTER — Encounter: Payer: Self-pay | Admitting: Podiatry

## 2020-10-18 DIAGNOSIS — E1142 Type 2 diabetes mellitus with diabetic polyneuropathy: Secondary | ICD-10-CM

## 2020-10-18 DIAGNOSIS — E11621 Type 2 diabetes mellitus with foot ulcer: Secondary | ICD-10-CM

## 2020-10-18 DIAGNOSIS — L97512 Non-pressure chronic ulcer of other part of right foot with fat layer exposed: Secondary | ICD-10-CM | POA: Diagnosis not present

## 2020-10-18 DIAGNOSIS — M2041 Other hammer toe(s) (acquired), right foot: Secondary | ICD-10-CM

## 2020-10-18 NOTE — Progress Notes (Signed)
Patient had appointment today for definitive and final diabetic shoe fitting and delivery.  Patient was seen by Betha, C.Ped, [facility].   The inserts fit well and accomplished full contact with the plantar surface of the foot bilateral; the shoes fit well and offered forefoot freedom, no noticible heel slippage, and good toe clearance w/ the insert in place.  Patient was advised to monitor of any skin irritation, breakdown.  Patient was satisfied with fit and function. 

## 2020-10-22 ENCOUNTER — Encounter: Payer: Self-pay | Admitting: Podiatry

## 2020-10-22 NOTE — Progress Notes (Signed)
  Subjective:  Patient ID: Linda Briggs, female    DOB: [birth year],  MRN: [id number]  Chief Complaint  Patient presents with  . Diabetic Ulcer    "its doing better"    65 y.o. female returns with the above complaint. History confirmed with patient.    She has been applying mupirocin ointment and a bandage daily.  Feels like it has been improving  Objective:  Physical Exam: warm, good capillary refill, no trophic changes or ulcerative lesions and normal DP and PT pulses.  Abnormal monofilament exam with loss of protective sensation  Right Foot: There is a full-thickness ulceration over the dorsal lateral PIPJ of the fifth toe, measurements similar to last visit, measuring 0.5 cm in width and 0.4 cm in length and 0.1 cm in depth.  Surrounding hyperkeratosis.  minimal erythema of the fourth and fifth toes to the level of the MTPJ.  No gross purulence.  No exposed bone tendon or capsule.   Radiographs: X-ray of the right foot: no fracture, dislocation, swelling or degenerative changes noted and no soft tissue emphysema, no signs of osteomyelitis Assessment:   1. Diabetic ulcer of toe of right foot associated with type 2 diabetes mellitus, with fat layer exposed (HCC)   2. Diabetic peripheral neuropathy (HCC)   3. Hammertoe of right foot      Plan:  Patient was evaluated and treated and all questions answered.  Patient educated on diabetes. Discussed proper diabetic foot care and discussed risks and complications of disease. Educated patient in depth on reasons to return to the office immediately should he/she discover anything concerning or new on the feet. All questions answered. Discussed proper shoes as well.   Ulcer right fifth toe -Debridement as below. -Dressed with Iodosorb, DSD. -Continue off-loading with surgical shoe. -Continue daily dressings with mupirocin  Procedure: Excisional Debridement of Wound Rationale: Removal of non-viable soft tissue from the wound to  promote healing.  Anesthesia: none Pre-Debridement Wound Measurements: 0.5 x 0.4 x 0.1 Post-Debridement Wound Measurements: Same as predebridement Type of Debridement: Sharp selective Tissue Removed: Non-viable soft tissue Depth of Debridement: subcutaneous tissue. Technique: Sharp selective debridement to bleeding, viable wound base.  Dressing: Dry, sterile, compression dressing. Disposition: Patient tolerated procedure well.        Return in about 3 weeks (around 11/08/2020) for wound re-check.

## 2020-10-26 DIAGNOSIS — I890 Lymphedema, not elsewhere classified: Secondary | ICD-10-CM | POA: Diagnosis not present

## 2020-10-31 ENCOUNTER — Other Ambulatory Visit: Payer: Self-pay | Admitting: Family Medicine

## 2020-10-31 ENCOUNTER — Telehealth: Payer: Self-pay

## 2020-10-31 NOTE — Telephone Encounter (Signed)
Forms faxed to [facility] [phone]

## 2020-11-08 ENCOUNTER — Encounter: Payer: Self-pay | Admitting: Podiatry

## 2020-11-08 ENCOUNTER — Ambulatory Visit: Payer: Medicare HMO | Admitting: Podiatry

## 2020-11-08 ENCOUNTER — Other Ambulatory Visit: Payer: Self-pay

## 2020-11-08 DIAGNOSIS — L97512 Non-pressure chronic ulcer of other part of right foot with fat layer exposed: Secondary | ICD-10-CM

## 2020-11-08 DIAGNOSIS — E1142 Type 2 diabetes mellitus with diabetic polyneuropathy: Secondary | ICD-10-CM

## 2020-11-08 DIAGNOSIS — E11621 Type 2 diabetes mellitus with foot ulcer: Secondary | ICD-10-CM | POA: Diagnosis not present

## 2020-11-08 DIAGNOSIS — M2041 Other hammer toe(s) (acquired), right foot: Secondary | ICD-10-CM

## 2020-11-08 NOTE — Progress Notes (Signed)
  Subjective:  Patient ID: Linda Briggs, female    DOB: July 30, 1956,  MRN: [id number]  Chief Complaint  Patient presents with  . Foot Ulcer    "Its a little better"    65 y.o. female returns with the above complaint. History confirmed with patient.    Has been using the collagen dressings which has been helping  Objective:  Physical Exam: warm, good capillary refill, no trophic changes or ulcerative lesions and normal DP and PT pulses.  Abnormal monofilament exam with loss of protective sensation  Right Foot: There is a full-thickness ulceration over the dorsal lateral PIPJ of the fifth toe, measurements improved to last visit, measuring 0.2 cm in width and 0.2 cm in length and 0.1 cm in depth.  Minimal hyperkeratosis.  No signs of infection   Radiographs: X-ray of the right foot: no fracture, dislocation, swelling or degenerative changes noted and no soft tissue emphysema, no signs of osteomyelitis Assessment:   No diagnosis found.   Plan:  Patient was evaluated and treated and all questions answered.  Patient educated on diabetes. Discussed proper diabetic foot care and discussed risks and complications of disease. Educated patient in depth on reasons to return to the office immediately should he/she discover anything concerning or new on the feet. All questions answered. Discussed proper shoes as well.   Ulcer right fifth toe -Debridement as below. -Dressed with Iodosorb, DSD. -Continue off-loading with surgical shoe. -Continue daily dressings with collagen  Procedure: Excisional Debridement of Wound Rationale: Removal of non-viable soft tissue from the wound to promote healing.  Anesthesia: none Pre-Debridement Wound Measurements: 0.2 x 0.2 x 0.1 Post-Debridement Wound Measurements: Same as predebridement Type of Debridement: Sharp selective Tissue Removed: Non-viable soft tissue Depth of Debridement: subcutaneous tissue. Technique: Sharp selective debridement to  bleeding, viable wound base.  Dressing: Dry, sterile, compression dressing. Disposition: Patient tolerated procedure well.        No follow-ups on file.

## 2020-11-14 ENCOUNTER — Telehealth: Payer: Self-pay | Admitting: Pharmacist

## 2020-11-14 NOTE — Progress Notes (Addendum)
Chronic Care Management Pharmacy Assistant   Name: DEVONIA FARRO  MRN: [id number] DOB: [birth year]  Reason for Encounter: Disease State For DM.   Conditions to be addressed/monitored: hypertension, Type II DM with retinopathy, hyperlipidemia  Recent office visits:  09/25/20 Dr. Tanya Nones For Type 2 DM. No medication changes.  Recent consult visits:  11/08/20 Podiatry McDonald, Rachelle Hora, DPM. For right foot ulcer. No medication changes. 10/18/20 Podiatry McDonald, Rachelle Hora, DPM. For right foot ulcer. No medication changes. 09/27/20 Podiatry McDonald, Rachelle Hora, DPM. For right foot ulcer. No medication changes. 09/08/20 Cardiology Dunn, Raymon Mutton, PA-C. For dyspnea on exertion. Referral to pulmonary rehab. STOPPED Sulfamethoxazole   Hospital visits: None since 09/04/20  Medications: Outpatient Encounter Medications as of 11/14/2020  Medication Sig   Alcohol Swabs (B-D SINGLE USE SWABS REGULAR) PADS Use to monitor FSBS 3x daily for fluctuating FSBS. DX: 250.00   aspirin EC 81 MG tablet Take 81 mg by mouth daily.   Blood Glucose Calibration (TRUE METRIX LEVEL 2) Normal SOLN Use as directed to monitor FSBS 3x daily. Dx: E11.65   Blood Glucose Monitoring Suppl (TRUE METRIX AIR GLUCOSE METER) DEVI 1 each by Does not apply route daily. Use as directed to monitor FSBS 3x daily. Dx: E11.65   diclofenac (FLECTOR) 1.3 % PTCH Place 1 patch onto the skin 2 (two) times daily. (Patient not taking: Reported on 09/25/2020)   FARXIGA 10 MG TABS tablet TAKE 1 TABLET EVERY DAY BEFORE BREAKFAST   furosemide (LASIX) 20 MG tablet TAKE 1 TABLET EVERY DAY   gabapentin (NEURONTIN) 300 MG capsule TAKE 1 CAPSULE THREE TIMES DAILY   glipiZIDE (GLUCOTROL XL) 5 MG 24 hr tablet TAKE 1 TABLET  DAILY WITH BREAKFAST.   glucose blood (TRUE METRIX BLOOD GLUCOSE TEST) test strip Use as directed to monitor FSBS 3x daily. Dx: E11.65   lisinopril (ZESTRIL) 5 MG tablet Take 1 tablet (5 mg total) by mouth daily.   metFORMIN (GLUCOPHAGE) 1000  MG tablet TAKE 1 TABLET TWICE DAILY WITH A MEAL   metoprolol tartrate (LOPRESSOR) 50 MG tablet Take 0.5 tablet (25MG ) by mouth Twice a day   Multiple Vitamin (MULTIVITAMIN) tablet Take 1 tablet by mouth daily.   mupirocin ointment (BACTROBAN) 2 % Apply 1 application topically 2 (two) times daily.   ondansetron (ZOFRAN) 4 MG tablet Take 1 tablet (4 mg total) by mouth every 8 (eight) hours as needed for nausea or vomiting. (Patient not taking: Reported on 09/25/2020)   pantoprazole (PROTONIX) 40 MG tablet Take 1 tablet (40 mg total) by mouth 2 (two) times daily before a meal.   pioglitazone (ACTOS) 30 MG tablet TAKE 1 TABLET EVERY DAY (Patient not taking: Reported on 09/25/2020)   potassium chloride (KLOR-CON) 10 MEQ tablet TAKE 1 TABLET EVERY DAY   pravastatin (PRAVACHOL) 40 MG tablet TAKE 1 TABLET EVERY DAY   traMADol (ULTRAM) 50 MG tablet TAKE 1 TABLET BY MOUTH EVERY 6 HOURS AS NEEDED FOR  CHRONIC  BACK  PAIN   TRUEplus Lancets 30G MISC Use as directed to monitor FSBS 3x daily. Dx: E11.65   TRULICITY 1.5 MG/0.5ML SOPN INJECT 1.5MG  (1 PEN) SUBCUTANEOUSLY EVERY WEEK AS DIRECTED   No facility-administered encounter medications on file as of 11/14/2020.   Recent Relevant Labs: Lab Results  Component Value Date/Time   HGBA1C 6.8 (H) 09/25/2020 11:38 AM   HGBA1C 7.0 (H) 03/20/2020 11:46 AM   MICROALBUR 0.7 09/25/2020 11:38 AM   MICROALBUR 0.6 06/21/2019 11:59 AM    Kidney  Function Lab Results  Component Value Date/Time   CREATININE 1.01 (H) 09/25/2020 11:38 AM   CREATININE 0.87 08/05/2020 05:52 PM   CREATININE 1.17 (H) 03/20/2020 11:46 AM   GFRNONAA 59 (L) 09/25/2020 11:38 AM   GFRAA 69 09/25/2020 11:38 AM    Current antihyperglycemic regimen:  Farxiga 10 mg Trulicity 1.5 mg per week Glipizide XL 5 mg tablet daily Metformin 1000 mg twice daily  What recent interventions/DTPs have been made to improve glycemic control:  None.  Have there been any recent hospitalizations or ED visits  since last visit with CPP? Patient stated no  Patient reports hypoglycemic symptoms, including Sweaty and Shaky   Patient denies hyperglycemic symptoms, including none   How often are you checking your blood sugar? once daily   What are your blood sugars ranging? Patient stated her readings has been between [phone]'s. She stated the lowest its been 97 and the highest its been 175. She stated she doesn't write them down.  During the week, how often does your blood glucose drop below 70? Patient stated Never  Are you checking your feet daily/regularly?  Patient stated she checks her feet daily and there is a area on her right pinky to but she does see a podiatrist for this concern.   Adherence Review: Is the patient currently on a STATIN medication? Pravastatin 40 mg  Is the patient currently on ACE/ARB medication? Lisinopril 5 mg daily  Does the patient have >5 day gap between last estimated fill dates? Per misc rpts, no.  Star Rating Drugs: Lisinopril 5 mg daily 07/07/20 90 DS , Metformin 08/26/20 90 DS, Pioglitazone 08/07/20 90 DS, Pravastatin 40 mg 09/06/20 90 DS   Patient stated she's lost about 20 pounds since the last time she seen Dr. Tanya Nones. She states she does not have any questions or concerns about her medications at this time.  Follow-Up: Pharmacist Review  Hulen Luster, RMA Clinical Pharmacist Assistant [phone]  6 minutes spent in review, coordination, and documentation.  Reviewed by: Willa Frater, PharmD Clinical Pharmacist [facility] [phone]

## 2020-11-20 ENCOUNTER — Other Ambulatory Visit: Payer: Self-pay | Admitting: Family Medicine

## 2020-11-29 ENCOUNTER — Other Ambulatory Visit: Payer: Self-pay

## 2020-11-29 ENCOUNTER — Ambulatory Visit: Payer: Medicare HMO | Admitting: Podiatry

## 2020-11-29 ENCOUNTER — Encounter: Payer: Self-pay | Admitting: Podiatry

## 2020-11-29 DIAGNOSIS — M2041 Other hammer toe(s) (acquired), right foot: Secondary | ICD-10-CM

## 2020-11-29 DIAGNOSIS — L97512 Non-pressure chronic ulcer of other part of right foot with fat layer exposed: Secondary | ICD-10-CM | POA: Diagnosis not present

## 2020-11-29 DIAGNOSIS — E1142 Type 2 diabetes mellitus with diabetic polyneuropathy: Secondary | ICD-10-CM

## 2020-11-29 DIAGNOSIS — E11621 Type 2 diabetes mellitus with foot ulcer: Secondary | ICD-10-CM

## 2020-11-29 DIAGNOSIS — L97509 Non-pressure chronic ulcer of other part of unspecified foot with unspecified severity: Secondary | ICD-10-CM | POA: Diagnosis not present

## 2020-11-29 DIAGNOSIS — E1140 Type 2 diabetes mellitus with diabetic neuropathy, unspecified: Secondary | ICD-10-CM | POA: Diagnosis not present

## 2020-11-29 NOTE — Progress Notes (Signed)
  Subjective:  Patient ID: Linda Briggs, female    DOB: 1956-01-23,  MRN: [id number]  Chief Complaint  Patient presents with  . Foot Ulcer    "I think its better.  It was stinging the other night" Patient picked up DM shoes today    65 y.o. female returns with the above complaint. History confirmed with patient.    Has been using the collagen dressings which has been helping  Objective:  Physical Exam: warm, good capillary refill, no trophic changes or ulcerative lesions and normal DP and PT pulses.  Abnormal monofilament exam with loss of protective sensation  Right Foot: There is a full-thickness ulceration over the dorsal lateral PIPJ of the fifth toe, measurements worsened since last visit, measuring 0.5 cm in width and 0.7 cm in length and 0.1 cm in depth.  Minimal hyperkeratosis.  No signs of infection      Radiographs: X-ray of the right foot: no fracture, dislocation, swelling or degenerative changes noted and no soft tissue emphysema, no signs of osteomyelitis Assessment:   1. Diabetic ulcer of toe of right foot associated with type 2 diabetes mellitus, with fat layer exposed (HCC)   2. Diabetic peripheral neuropathy (HCC)   3. Hammertoe of right foot      Plan:  Patient was evaluated and treated and all questions answered.  Patient educated on diabetes. Discussed proper diabetic foot care and discussed risks and complications of disease. Educated patient in depth on reasons to return to the office immediately should he/she discover anything concerning or new on the feet. All questions answered. Discussed proper shoes as well.   Diabetic shoes were assessed for for fit and function and she was pleased with these.  Ulcer right fifth toe -Debridement as below. -Dressed with Iodosorb, DSD. -Continue off-loading with surgical shoe. -Continue daily dressings with collagen  At this point his ulcer has waxed and waned for several months now.  I would order an MRI to  evaluate to see if there is any signs of deeper infection that we are not seeing.  If it is negative for osteomyelitis or deep infection then I think we should consider advanced wound care with biologic grafting.  Procedure: Excisional Debridement of Wound Rationale: Removal of non-viable soft tissue from the wound to promote healing.  Anesthesia: none Pre-Debridement Wound Measurements: 0.5 cm in width and 0.7 cm in length and 0.1 cm in depthPost-Debridement Wound Measurements: Same as predebridement Type of Debridement: Sharp selective Tissue Removed: Non-viable soft tissue Depth of Debridement: subcutaneous tissue. Technique: Sharp selective debridement to bleeding, viable wound base.  Dressing: Dry, sterile, compression dressing. Disposition: Patient tolerated procedure well.        Return in about 3 weeks (around 12/20/2020) for wound care.

## 2020-12-05 ENCOUNTER — Other Ambulatory Visit: Payer: Self-pay | Admitting: Family Medicine

## 2020-12-08 ENCOUNTER — Telehealth: Payer: Self-pay

## 2020-12-08 NOTE — Telephone Encounter (Signed)
[facility] pharmacy is requesting refill of Mupirocin ointment for patient.  Please advise if ok to give refills on this.  Thanks

## 2020-12-08 NOTE — Telephone Encounter (Signed)
Yes that's fine 

## 2020-12-11 MED ORDER — MUPIROCIN 2 % EX OINT: 1.0000 "application " | TOPICAL_OINTMENT | Freq: Two times a day (BID) | CUTANEOUS | 2 refills | Status: DC

## 2020-12-11 NOTE — Addendum Note (Signed)
Addended by: Geraldine Contras D on: 12/11/2020 12:26 PM   Modules accepted: Orders

## 2020-12-11 NOTE — Telephone Encounter (Signed)
Script has been sent to [facility] pharmacy

## 2020-12-12 ENCOUNTER — Other Ambulatory Visit: Payer: Self-pay

## 2020-12-12 ENCOUNTER — Ambulatory Visit
Admission: RE | Admit: 2020-12-12 | Discharge: 2020-12-12 | Disposition: A | Discharge status: Home / Self Care | Payer: Medicare HMO | Source: Ambulatory Visit | Attending: Podiatry | Admitting: Podiatry

## 2020-12-12 DIAGNOSIS — M86171 Other acute osteomyelitis, right ankle and foot: Secondary | ICD-10-CM | POA: Diagnosis not present

## 2020-12-12 DIAGNOSIS — E11621 Type 2 diabetes mellitus with foot ulcer: Secondary | ICD-10-CM

## 2020-12-12 DIAGNOSIS — R600 Localized edema: Secondary | ICD-10-CM | POA: Diagnosis not present

## 2020-12-12 DIAGNOSIS — L97512 Non-pressure chronic ulcer of other part of right foot with fat layer exposed: Secondary | ICD-10-CM

## 2020-12-12 DIAGNOSIS — L97519 Non-pressure chronic ulcer of other part of right foot with unspecified severity: Secondary | ICD-10-CM | POA: Diagnosis not present

## 2020-12-13 ENCOUNTER — Encounter: Payer: Self-pay | Admitting: Family Medicine

## 2020-12-13 DIAGNOSIS — E113291 Type 2 diabetes mellitus with mild nonproliferative diabetic retinopathy without macular edema, right eye: Secondary | ICD-10-CM | POA: Diagnosis not present

## 2020-12-13 DIAGNOSIS — E113392 Type 2 diabetes mellitus with moderate nonproliferative diabetic retinopathy without macular edema, left eye: Secondary | ICD-10-CM | POA: Diagnosis not present

## 2020-12-13 DIAGNOSIS — H40013 Open angle with borderline findings, low risk, bilateral: Secondary | ICD-10-CM | POA: Diagnosis not present

## 2020-12-13 DIAGNOSIS — H353131 Nonexudative age-related macular degeneration, bilateral, early dry stage: Secondary | ICD-10-CM | POA: Diagnosis not present

## 2020-12-13 LAB — HM DIABETES EYE EXAM

## 2020-12-14 ENCOUNTER — Telehealth: Payer: Self-pay | Admitting: Podiatry

## 2020-12-14 NOTE — Telephone Encounter (Signed)
French Ana from [facility] radiology called about Linda Briggs MRI results.   Call back number [phone]

## 2020-12-14 NOTE — Telephone Encounter (Signed)
Do you mind re-sending these to [facility]?s

## 2020-12-15 ENCOUNTER — Other Ambulatory Visit: Payer: Self-pay | Admitting: Family Medicine

## 2020-12-20 ENCOUNTER — Other Ambulatory Visit: Payer: Self-pay

## 2020-12-20 ENCOUNTER — Ambulatory Visit: Payer: Medicare HMO | Admitting: Podiatry

## 2020-12-20 ENCOUNTER — Encounter: Payer: Self-pay | Admitting: Podiatry

## 2020-12-20 DIAGNOSIS — M86671 Other chronic osteomyelitis, right ankle and foot: Secondary | ICD-10-CM | POA: Diagnosis not present

## 2020-12-20 DIAGNOSIS — L97512 Non-pressure chronic ulcer of other part of right foot with fat layer exposed: Secondary | ICD-10-CM

## 2020-12-20 DIAGNOSIS — E1142 Type 2 diabetes mellitus with diabetic polyneuropathy: Secondary | ICD-10-CM | POA: Diagnosis not present

## 2020-12-20 DIAGNOSIS — E11621 Type 2 diabetes mellitus with foot ulcer: Secondary | ICD-10-CM | POA: Diagnosis not present

## 2020-12-20 MED ORDER — OMADACYCLINE TOSYLATE 150 MG PO TABS: ORAL_TABLET | ORAL | 0 refills | Status: DC

## 2020-12-21 ENCOUNTER — Telehealth: Payer: Self-pay | Admitting: *Deleted

## 2020-12-21 MED ORDER — DOXYCYCLINE HYCLATE 100 MG PO TABS: 100.0000 mg | ORAL_TABLET | Freq: Two times a day (BID) | ORAL | 0 refills | Status: DC

## 2020-12-21 NOTE — Telephone Encounter (Signed)
Doxycycline csent

## 2020-12-21 NOTE — Telephone Encounter (Signed)
"  I have a question about a prescription that Dr. Lilian Kapur wrote yesterday.  My pharmacy can't fill it.  He had told me since it's a new antibiotic that I may not be able to get it or afford it.  He told me to let him know.  So, I need to find out if he should write me a prescription for the old antibiotic or should I look for another pharmacy to see if they have it?  Call me back and let me know what I need to do."

## 2020-12-21 NOTE — Telephone Encounter (Signed)
[facility] pharmacy is wanting to let the doctor know that they do not have the Omadacycline Tosylate-150mg  and  do not have any way of getting the prescription. They would like to know if you could call something else in or try a specialty pharmacy. Please advise.

## 2020-12-22 ENCOUNTER — Telehealth: Payer: Self-pay | Admitting: Family Medicine

## 2020-12-22 NOTE — Telephone Encounter (Signed)
I'm calling to let you know that Dr. Lilian Kapur said he sent your old prescription to your pharmacy on yesterday.  He said you should be able to pick that up today.  Please call if you have any questions.

## 2020-12-22 NOTE — Telephone Encounter (Signed)
Patient called to request refill of   predniSONE (DELTASONE) 20 MG tablet [[id number]]  Patient pulled muscle in back picking up crate of pets.   Pharmacy: [facility] [address] [place], [place] - [address]  [address], [place] [address]  Phone:  [phone] Fax:  [phone]  Please advise patient when Rx called in at [phone]  Patient starting antibiotic today. Asking if Rx can be called in asap to avoid 2 trips to the pharmacy.

## 2020-12-22 NOTE — Telephone Encounter (Signed)
I saw, yesterday I re-sent her the older version (doxycycline), should be ready now if you can let her know. Thanks!!

## 2020-12-22 NOTE — Telephone Encounter (Signed)
Patient notified of medication via voice mail 

## 2020-12-22 NOTE — Telephone Encounter (Signed)
Patient will need OV for evaluation.

## 2020-12-24 NOTE — Progress Notes (Signed)
Subjective:  Patient ID: Linda Briggs, female    DOB: 05-Aug-1956,  MRN: [id number]  Chief Complaint  Patient presents with  . Wound Check    65 y.o. female returns with the above complaint. History confirmed with patient.   Completed the MRI  Objective:  Physical Exam: warm, good capillary refill, no trophic changes or ulcerative lesions and normal DP and PT pulses.  Abnormal monofilament exam with loss of protective sensation  Right Foot: There is a full-thickness ulceration over the dorsal lateral PIPJ of the fifth toe, measurements worsened since last visit, measuring 0.2 cm time 0.2 cm times 0.1 cm in depth.  Minimal hyperkeratosis.  No signs of infection     Study Result  Narrative & Impression  CLINICAL DATA:  Diabetic foot ulcer of the right toe  EXAM: MRI OF THE RIGHT TOES WITHOUT CONTRAST  TECHNIQUE: Multiplanar, multisequence MR imaging of the right forefoot was performed. No intravenous contrast was administered.  COMPARISON:  None.  FINDINGS: Bones/Joint/Cartilage  Bone marrow edema with confluent low T1 marrow signal involving the proximal and distal phalanx of the right fifth toe (series 6 and 7, image 14) compatible with acute osteomyelitis. The base of the fifth toe proximal phalanx maintains preserved fatty T1 marrow signal. No fifth MTP joint effusion. Remaining osseous structures are intact with preserved marrow signal. No fracture or dislocation.  Ligaments  Intact Lisfranc ligament. Collateral ligaments of the forefoot appear intact.  Muscles and Tendons  Chronic denervation changes of the intrinsic foot musculature. Flexor and extensor tendons intact.  Soft tissues  Subtle soft tissue ulceration at the plantar lateral aspect of the fifth toe. Surrounding soft tissue edema. Prominent subcutaneous edema over the dorsum of the foot. Slightly more focal fluid collection within the superficial soft tissues overlying the  fourth metatarsal diaphysis measuring 1.8 x 0.4 x 1.1 cm (series 11, image 15; series 8, image 9).  IMPRESSION: 1. Acute osteomyelitis of the proximal and distal phalanx of the right fifth toe. 2. Subtle soft tissue ulceration at the plantar lateral aspect of the fifth toe with surrounding soft tissue edema, likely cellulitis. 3. Dorsal subcutaneous edema with slightly more focal fluid collection within the superficial soft tissues overlying the fourth metatarsal diaphysis measuring 1.8 x 0.4 x 1.1 cm, which may represent a small abscess.  These results will be called to the ordering clinician or representative by the Radiologist Assistant, and communication documented in the PACS or [employer].   Electronically Signed   By: Duanne Guess D.O.   On: 12/14/2020 08:44      Radiographs: X-ray of the right foot: no fracture, dislocation, swelling or degenerative changes noted and no soft tissue emphysema, no signs of osteomyelitis Assessment:   No diagnosis found.   Plan:  Patient was evaluated and treated and all questions answered.  Patient educated on diabetes. Discussed proper diabetic foot care and discussed risks and complications of disease. Educated patient in depth on reasons to return to the office immediately should he/she discover anything concerning or new on the feet. All questions answered. Discussed proper shoes as well.    Ulcer right fifth toe -Debridement as below. -Dressed with Iodosorb, DSD. -Continue off-loading with surgical shoe. -Continue daily dressings with collagen  I also reviewed the MRI findings with her.  I discussed with her that osteomyelitis typically every 3 treated with antibiotics, surgery or both.  Surgery for this would mean amputation of the toe.  Considering the size of the wound and the  fact that it continues to improve with think we should treat her with oral antibiotics for 4 to 6 weeks until the wound closes to see if  we can avoid amputation.  Advised that this could recur in the future.  She understands and wishes to proceed with this plan.  Doxycycline sent to pharmacy  Procedure: Excisional Debridement of Wound Rationale: Removal of non-viable soft tissue from the wound to promote healing.  Anesthesia: none Pre-Debridement Wound Measurements: 0.2 cm time 0.2 cm time 0.1 cm depthPost-Debridement Wound Measurements: Same as predebridement Type of Debridement: Sharp selective Tissue Removed: Non-viable soft tissue Depth of Debridement: subcutaneous tissue. Technique: Sharp selective debridement to bleeding, viable wound base.  Dressing: Dry, sterile, compression dressing. Disposition: Patient tolerated procedure well.        No follow-ups on file.

## 2020-12-26 NOTE — Telephone Encounter (Signed)
Patient taking tramadol which is working for her at this point. Patient will call back to schedule appt if needed.

## 2021-01-03 NOTE — Progress Notes (Deleted)
Chronic Care Management Pharmacy Note  01/03/2021 Name:  Linda Briggs MRN:  [id number] DOB:  [birth year]  Subjective: Linda Briggs is an 65 y.o. year old female who is a primary patient of Pickard, Cammie Mcgee, MD.  The CCM team was consulted for assistance with disease management and care coordination needs.    Engaged with patient by telephone for follow up visit in response to provider referral for pharmacy case management and/or care coordination services.   Consent to Services:  The patient was given the following information about Chronic Care Management services today, agreed to services, and gave verbal consent: 1. CCM service includes personalized support from designated clinical staff supervised by the primary care provider, including individualized plan of care and coordination with other care providers 2. 24/7 contact phone numbers for assistance for urgent and routine care needs. 3. Service will only be billed when office clinical staff spend 20 minutes or more in a month to coordinate care. 4. Only one practitioner may furnish and bill the service in a calendar month. 5.The patient may stop CCM services at any time (effective at the end of the month) by phone call to the office staff. 6. The patient will be responsible for cost sharing (co-pay) of up to 20% of the service fee (after annual deductible is met). Patient agreed to services and consent obtained.  Patient Care Team: Susy Frizzle, MD as PCP - General (Family Medicine) Minna Merritts, MD as PCP - Cardiology (Cardiology) Edythe Clarity, [facility] as Pharmacist (Pharmacist)  Recent office visits: 09/25/20 Dr. Dennard Schaumann For Type 2 DM. No medication changes.  Recent consult visits: 11/08/20 Podiatry McDonald, Stephan Minister, DPM. For right foot ulcer. No medication changes. 10/18/20 Podiatry McDonald, Stephan Minister, DPM. For right foot ulcer. No medication changes. 09/27/20 Podiatry McDonald, Stephan Minister, DPM. For right foot ulcer. No  medication changes. 09/08/20 Cardiology Dunn, Areta Haber, PA-C. For dyspnea on exertion. Referral to pulmonary rehab. STOPPED Sulfamethoxazole   Hospital visits: None in previous 6 months  Objective:  Lab Results  Component Value Date   CREATININE 1.01 (H) 09/25/2020   BUN 15 09/25/2020   GFRNONAA 59 (L) 09/25/2020   GFRAA 69 09/25/2020   NA 139 09/25/2020   K 4.3 09/25/2020   CALCIUM 9.3 09/25/2020   CO2 24 09/25/2020   GLUCOSE 143 (H) 09/25/2020    Lab Results  Component Value Date/Time   HGBA1C 6.8 (H) 09/25/2020 11:38 AM   HGBA1C 7.0 (H) 03/20/2020 11:46 AM   MICROALBUR 0.7 09/25/2020 11:38 AM   MICROALBUR 0.6 06/21/2019 11:59 AM    Last diabetic Eye exam:  Lab Results  Component Value Date/Time   HMDIABEYEEXA Retinopathy (A) 03/25/2019 12:00 AM    Last diabetic Foot exam: No results found for: HMDIABFOOTEX   Lab Results  Component Value Date   CHOL 153 09/25/2020   HDL 30 (L) 09/25/2020   LDLCALC 98 09/25/2020   TRIG 155 (H) 09/25/2020   CHOLHDL 5.1 (H) 09/25/2020    Hepatic Function Latest Ref Rng & Units 09/25/2020 03/20/2020 12/15/2019  Total Protein 6.1 - 8.1 g/dL 6.5 7.0 6.5  Albumin 3.6 - 5.1 g/dL - - -  AST 10 - 35 U/L 23 47(H) 21  ALT 6 - 29 U/L 20 43(H) 25  Alk Phosphatase 33 - 130 U/L - - -  Total Bilirubin 0.2 - 1.2 mg/dL 0.6 0.8 0.6    Lab Results  Component Value Date/Time   TSH 3.185 08/29/2014 11:50 AM  CBC Latest Ref Rng & Units 09/25/2020 08/05/2020 03/20/2020  WBC 3.8 - 10.8 Thousand/uL 10.3 11.9(H) 10.4  Hemoglobin 11.7 - 15.5 g/dL 14.3 14.8 14.7  Hematocrit 35.0 - 45.0 % 42.3 45.1 44.5  Platelets 140 - 400 Thousand/uL 144 184 135(L)    No results found for: VD25OH  Clinical ASCVD: {YES/NO:21197} The 10-year ASCVD risk score Mikey Bussing DC Jr., et al., 2013) is: 14.7%   Values used to calculate the score:     Age: 51 years     Sex: Female     Is Non-Hispanic African American: No     Diabetic: Yes     Tobacco smoker: No     Systolic  Blood Pressure: 136 mmHg     Is BP treated: Yes     HDL Cholesterol: 30 mg/dL     Total Cholesterol: 153 mg/dL    Depression screen [facility] 2/9 11/17/2017 08/18/2017 11/05/2016  Decreased Interest 1 1 0  Down, Depressed, Hopeless 0 0 0  PHQ - 2 Score 1 1 0  Altered sleeping - 1 -  Tired, decreased energy - 1 -  Change in appetite - 1 -  Feeling bad or failure about yourself  - 0 -  Trouble concentrating - 0 -  Moving slowly or fidgety/restless - 0 -  Suicidal thoughts - 0 -  PHQ-9 Score - 4 -  Difficult doing work/chores - Not difficult at all -     ***Other: (CHADS2VASc if Afib, MMRC or CAT for COPD, ACT, DEXA)  Social History   Tobacco Use  Smoking Status Never Smoker  Smokeless Tobacco Never Used   BP Readings from Last 3 Encounters:  09/25/20 136/74  09/08/20 134/62  08/28/20 118/73   Pulse Readings from Last 3 Encounters:  09/25/20 85  09/08/20 77  08/28/20 78   Wt Readings from Last 3 Encounters:  09/25/20 (!) 302 lb (137 kg)  09/08/20 (!) 303 lb (137.4 kg)  08/28/20 (!) 302 lb (137 kg)   BMI Readings from Last 3 Encounters:  09/25/20 48.74 kg/m  09/08/20 48.91 kg/m  08/28/20 48.74 kg/m    Assessment/Interventions: Review of patient past medical history, allergies, medications, health status, including review of consultants reports, laboratory and other test data, was performed as part of comprehensive evaluation and provision of chronic care management services.   SDOH:  (Social Determinants of Health) assessments and interventions performed: {yes/no:20286}  SDOH Screenings   Alcohol Screen: Not on file  Depression (QBV6-9): Not on file  Financial Resource Strain: Low Risk   . Difficulty of Paying Living Expenses: Not very hard  Food Insecurity: Not on file  Housing: Not on file  Physical Activity: Not on file  Social Connections: Not on file  Stress: Not on file  Tobacco Use: Low Risk   . Smoking Tobacco Use: Never Smoker  . Smokeless Tobacco Use:  Never Used  Transportation Needs: Not on file    CCM Care Plan  Allergies  Allergen Reactions  . Codeine Anaphylaxis and Hives  . Penicillins Anaphylaxis, Hives and Other (See Comments)    Has patient had a PCN reaction causing immediate rash, facial/tongue/throat swelling, SOB or lightheadedness with hypotension: No Has patient had a PCN reaction causing severe rash involving mucus membranes or skin necrosis: No Has patient had a PCN reaction that required hospitalization: No Has patient had a PCN reaction occurring within the last 10 years: No If all of the above answers are "NO", then may proceed with Cephalosporin use.   Marland Kitchen  Ciprofloxacin Other (See Comments)    Tongue Swelling  . Food Nausea And Vomiting and Other (See Comments)    Olives   . Januvia [Sitagliptin] Other (See Comments)    Stomach cramps  . Latex Hives    Medications Reviewed Today    Reviewed by Mack Hook, LPN (Licensed Practical Nurse) on 12/20/20 at 1506  Med List Status: <None>  Medication Order Taking? Sig Documenting Provider Last Dose Status Informant  ACCU-CHEK GUIDE test strip [id number]  TEST BLOOD SUGAR THREE TIMES DAILY Susy Frizzle, MD  Active   Accu-Chek Softclix Lancets lancets [id number]  TEST BLOOD SUGAR THREE TIMES DAILY Susy Frizzle, MD  Active   Alcohol Swabs (B-D SINGLE USE SWABS REGULAR) PADS [id number]  USE THREE TIMES DAILY Susy Frizzle, MD  Active   aspirin EC 81 MG tablet [id number] No Take 81 mg by mouth daily. [provider] Taking Active Self  Blood Glucose Calibration (TRUE METRIX LEVEL 2) Normal SOLN [id number] No Use as directed to monitor FSBS 3x daily. Dx: E11.65 Alycia Rossetti, MD Taking Active Self  Blood Glucose Monitoring Suppl (TRUE METRIX AIR GLUCOSE METER) DEVI [id number] No 1 each by Does not apply route daily. Use as directed to monitor FSBS 3x daily. Dx: E11.65 Susy Frizzle, MD Taking Active   diclofenac (FLECTOR) 1.3 % PTCH [id number] No  Place 1 patch onto the skin 2 (two) times daily.  Patient not taking: Reported on 09/25/2020   Dyann Ruddle Not Taking Active   FARXIGA 10 MG TABS tablet [id number] No TAKE 1 TABLET EVERY DAY BEFORE BREAKFAST Susy Frizzle, MD Taking Active   furosemide (LASIX) 20 MG tablet [id number]  TAKE 1 TABLET EVERY DAY Susy Frizzle, MD  Active   gabapentin (NEURONTIN) 300 MG capsule [id number]  TAKE 1 CAPSULE THREE TIMES DAILY Susy Frizzle, MD  Active   glipiZIDE (GLUCOTROL XL) 5 MG 24 hr tablet [id number]  TAKE 1 TABLET  DAILY WITH BREAKFAST. Susy Frizzle, MD  Active   lisinopril (ZESTRIL) 5 MG tablet [id number] No Take 1 tablet (5 mg total) by mouth daily. Susy Frizzle, MD Taking Active   metFORMIN (GLUCOPHAGE) 1000 MG tablet [id number]  TAKE 1 TABLET TWICE DAILY WITH A MEAL Martin, Modena Nunnery, MD  Active   metoprolol tartrate (LOPRESSOR) 50 MG tablet [id number] No Take 0.5 tablet (25MG) by mouth Twice a day [provider] Taking Active   Multiple Vitamin (MULTIVITAMIN) tablet [id number] No Take 1 tablet by mouth daily. [provider] Taking Active   mupirocin ointment (BACTROBAN) 2 % [id number]  Apply 1 application topically 2 (two) times daily. Criselda Peaches, DPM  Active   ondansetron (ZOFRAN) 4 MG tablet [id number] No Take 1 tablet (4 mg total) by mouth every 8 (eight) hours as needed for nausea or vomiting.  Patient not taking: Reported on 09/25/2020   Susy Frizzle, MD Not Taking Active   pantoprazole (PROTONIX) 40 MG tablet [id number] No Take 1 tablet (40 mg total) by mouth 2 (two) times daily before a meal. Susy Frizzle, MD Taking Active   pioglitazone (ACTOS) 30 MG tablet [id number] No TAKE 1 TABLET EVERY DAY  Patient not taking: Reported on 09/25/2020   Susy Frizzle, MD Not Taking Active   potassium chloride (KLOR-CON) 10 MEQ tablet [id number]  TAKE 1 TABLET EVERY DAY Susy Frizzle, MD  Active   pravastatin (PRAVACHOL) 40 MG tablet  [id number] No TAKE 1 TABLET  EVERY DAY Susy Frizzle, MD Taking Active   traMADol (ULTRAM) 50 MG tablet [id number] No TAKE 1 TABLET BY MOUTH EVERY 6 HOURS AS NEEDED FOR  CHRONIC  BACK  PAIN Susy Frizzle, MD Taking Active   TRULICITY 1.5 NT/7.0YF SOPN [id number] No INJECT 1.5MG (1 PEN) SUBCUTANEOUSLY EVERY WEEK AS DIRECTED Susy Frizzle, MD Taking Active           Patient Active Problem List   Diagnosis Date Noted  . Ulcerated, foot, right, with fat layer exposed ([place]) 08/18/2020  . Abnormal barium swallow   . Nonproliferative diabetic retinopathy ([place])   . Globus pharyngeus 02/24/2018  . Laryngopharyngeal reflux (LPR) 02/24/2018  . Paroxysmal tachycardia ([place]) 01/07/2018  . Morbid obesity ([place]) 01/13/2017  . Pure hypercholesterolemia 01/11/2017  . Localized edema 01/11/2017  . Diabetic retinopathy ([place]) 05/29/2016  . Abdominal pain 03/28/2014  . Type 2 diabetes mellitus with complications ([place]) 91/63/8466  . Weakness 07/26/2013  . Hyperlipidemia   . Essential hypertension   . Shortness of breath 03/27/2012  . Edema 03/27/2012  . Near syncope 03/27/2012  . Palpitations 03/27/2012    Immunization History  Administered Date(s) Administered  . Influenza,inj,Quad PF,6+ Mos 05/30/2014, 05/29/2015, 07/02/2017, 05/22/2018  . Pneumococcal Conjugate-13 08/18/2017  . Pneumococcal Polysaccharide-23 08/29/2014    Conditions to be addressed/monitored:  {USCCMDZASSESSMENTOPTIONS:23563}  There are no care plans that you recently modified to display for this patient.    Medication Assistance: {MEDASSISTANCEINFO:25044}  Patient's preferred pharmacy is:  [place], [place] - [address] [place] [address] Phone: [phone] Fax: [phone]  [facility] - [place], [address] [place] [address] Phone: [phone] Fax: [phone]  Uses pill box?  {Yes or If no, why not?:20788} Pt endorses ***% compliance  We discussed: {Pharmacy options:24294} Patient decided to: {US Pharmacy Plan:23885}  Care Plan and Follow Up Patient Decision:  {FOLLOWUP:24991}  Plan: {CM FOLLOW UP TLXB:26203}  ***  Current Barriers:  . {pharmacybarriers:24917}  Pharmacist Clinical Goal(s):  Marland Kitchen Patient will {PHARMACYGOALCHOICES:24921} through collaboration with PharmD and provider.   Interventions: . 1:1 collaboration with Susy Frizzle, MD regarding development and update of comprehensive plan of care as evidenced by provider attestation and co-signature . Inter-disciplinary care team collaboration (see longitudinal plan of care) . Comprehensive medication review performed; medication list updated in electronic medical record  {CCM PHARMD DISEASE STATES:25130}  Patient Goals/Self-Care Activities . Patient will:  - {pharmacypatientgoals:24919}  Follow Up Plan: {CM FOLLOW UP TDHR:41638}

## 2021-01-04 ENCOUNTER — Other Ambulatory Visit: Payer: Self-pay | Admitting: Cardiovascular Disease

## 2021-01-08 ENCOUNTER — Telehealth: Payer: Self-pay

## 2021-01-10 ENCOUNTER — Ambulatory Visit: Payer: Medicare HMO | Admitting: Podiatry

## 2021-01-15 ENCOUNTER — Ambulatory Visit: Payer: Medicare HMO | Admitting: Podiatry

## 2021-01-15 ENCOUNTER — Other Ambulatory Visit: Payer: Self-pay

## 2021-01-15 ENCOUNTER — Encounter: Payer: Self-pay | Admitting: Podiatry

## 2021-01-15 DIAGNOSIS — E11621 Type 2 diabetes mellitus with foot ulcer: Secondary | ICD-10-CM | POA: Diagnosis not present

## 2021-01-15 DIAGNOSIS — M86671 Other chronic osteomyelitis, right ankle and foot: Secondary | ICD-10-CM

## 2021-01-15 DIAGNOSIS — E1142 Type 2 diabetes mellitus with diabetic polyneuropathy: Secondary | ICD-10-CM

## 2021-01-15 DIAGNOSIS — L97512 Non-pressure chronic ulcer of other part of right foot with fat layer exposed: Secondary | ICD-10-CM

## 2021-01-15 DIAGNOSIS — M2041 Other hammer toe(s) (acquired), right foot: Secondary | ICD-10-CM

## 2021-01-15 MED ORDER — DOXYCYCLINE HYCLATE 100 MG PO TABS: 100.0000 mg | ORAL_TABLET | Freq: Two times a day (BID) | ORAL | 0 refills | Status: AC

## 2021-01-15 NOTE — Progress Notes (Signed)
Subjective:  Patient ID: Linda Briggs, female    DOB: 1955-11-04,  MRN: [id number]  Chief Complaint  Patient presents with  . Wound Check    "I think its doing good.  It looks like a callus to me"    65 y.o. female returns with the above complaint. History confirmed with patient.   She has been taking the doxycycline   Objective:  Physical Exam: warm, good capillary refill, no trophic changes or ulcerative lesions and normal DP and PT pulses.  Abnormal monofilament exam with loss of protective sensation  Right Foot: There is a full-thickness ulceration over the dorsal lateral PIPJ of the fifth toe, measurements worsened since last visit, measuring 0.1 cm time 0.1 cm times 0.1 cm in depth.  Minimal hyperkeratosis.  No signs of infection.  Nearly fully healed       Study Result  Narrative & Impression  CLINICAL DATA:  Diabetic foot ulcer of the right toe  EXAM: MRI OF THE RIGHT TOES WITHOUT CONTRAST  TECHNIQUE: Multiplanar, multisequence MR imaging of the right forefoot was performed. No intravenous contrast was administered.  COMPARISON:  None.  FINDINGS: Bones/Joint/Cartilage  Bone marrow edema with confluent low T1 marrow signal involving the proximal and distal phalanx of the right fifth toe (series 6 and 7, image 14) compatible with acute osteomyelitis. The base of the fifth toe proximal phalanx maintains preserved fatty T1 marrow signal. No fifth MTP joint effusion. Remaining osseous structures are intact with preserved marrow signal. No fracture or dislocation.  Ligaments  Intact Lisfranc ligament. Collateral ligaments of the forefoot appear intact.  Muscles and Tendons  Chronic denervation changes of the intrinsic foot musculature. Flexor and extensor tendons intact.  Soft tissues  Subtle soft tissue ulceration at the plantar lateral aspect of the fifth toe. Surrounding soft tissue edema. Prominent subcutaneous edema over the dorsum of  the foot. Slightly more focal fluid collection within the superficial soft tissues overlying the fourth metatarsal diaphysis measuring 1.8 x 0.4 x 1.1 cm (series 11, image 15; series 8, image 9).  IMPRESSION: 1. Acute osteomyelitis of the proximal and distal phalanx of the right fifth toe. 2. Subtle soft tissue ulceration at the plantar lateral aspect of the fifth toe with surrounding soft tissue edema, likely cellulitis. 3. Dorsal subcutaneous edema with slightly more focal fluid collection within the superficial soft tissues overlying the fourth metatarsal diaphysis measuring 1.8 x 0.4 x 1.1 cm, which may represent a small abscess.  These results will be called to the ordering clinician or representative by the Radiologist Assistant, and communication documented in the PACS or [employer].   Electronically Signed   By: Duanne Guess D.O.   On: 12/14/2020 08:44      Radiographs: X-ray of the right foot: no fracture, dislocation, swelling or degenerative changes noted and no soft tissue emphysema, no signs of osteomyelitis Assessment:   1. Diabetic ulcer of toe of right foot associated with type 2 diabetes mellitus, with fat layer exposed (HCC)   2. Diabetic peripheral neuropathy (HCC)   3. 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 Hammertoe of right foot      Plan:  Patient was evaluated and treated and all questions answered.  Patient educated on diabetes. Discussed proper diabetic foot care and discussed risks and complications of disease. Educated patient in depth on reasons to return to the office immediately should he/she discover anything concerning or new on the feet. All questions answered. Discussed proper shoes  as well.    Ulcer right fifth toe -Debridement as below. -Dressed with Iodosorb, DSD. -Continue off-loading with surgical shoe. -Continue daily dressings with mupirocin -It is nearly fully healed.  I would like to continue her  doxycycline for the 2 weeks for total of 6 weeks.  I will see her back in 4 weeks.  If it worsens or opens back up and I think we should consider surgical resection of the infected bone.  We discussed this today and the risk of amputation.  Procedure: Excisional Debridement of Wound Rationale: Removal of non-viable soft tissue from the wound to promote healing.  Anesthesia: none Pre-Debridement Wound Measurements: 0.1 cm time 0.1 cm time 0.1 cm depthPost-Debridement Wound Measurements: Same as predebridement Type of Debridement: Sharp selective Tissue Removed: Non-viable soft tissue Depth of Debridement: subcutaneous tissue. Technique: Sharp selective debridement to bleeding, viable wound base.  Dressing: Dry, sterile, compression dressing. Disposition: Patient tolerated procedure well.        Return in about 4 weeks (around 02/12/2021) for wound care.

## 2021-02-12 ENCOUNTER — Encounter: Payer: Self-pay | Admitting: Podiatry

## 2021-02-12 ENCOUNTER — Ambulatory Visit: Payer: Medicare HMO | Admitting: Podiatry

## 2021-02-12 ENCOUNTER — Other Ambulatory Visit: Payer: Self-pay

## 2021-02-12 DIAGNOSIS — L97512 Non-pressure chronic ulcer of other part of right foot with fat layer exposed: Secondary | ICD-10-CM

## 2021-02-12 DIAGNOSIS — E11621 Type 2 diabetes mellitus with foot ulcer: Secondary | ICD-10-CM | POA: Diagnosis not present

## 2021-02-12 NOTE — Progress Notes (Signed)
Subjective:  Patient ID: Linda Briggs, female    DOB: Apr 09, 1956,  MRN: [id number]  Chief Complaint  Patient presents with   Diabetic Ulcer    4 week follow up for wound care/ at risk diabetic foot care    65 y.o. female returns with the above complaint. History confirmed with patient.   She has completed the doxycycline   Objective:  Physical Exam: warm, good capillary refill, no trophic changes or ulcerative lesions and normal DP and PT pulses.  Abnormal monofilament exam with loss of protective sensation  Right Foot: Fourth and fifth toes wounds have fully healed completely      Study Result  Narrative & Impression  CLINICAL DATA:  Diabetic foot ulcer of the right toe   EXAM: MRI OF THE RIGHT TOES WITHOUT CONTRAST   TECHNIQUE: Multiplanar, multisequence MR imaging of the right forefoot was performed. No intravenous contrast was administered.   COMPARISON:  None.   FINDINGS: Bones/Joint/Cartilage   Bone marrow edema with confluent low T1 marrow signal involving the proximal and distal phalanx of the right fifth toe (series 6 and 7, image 14) compatible with acute osteomyelitis. The base of the fifth toe proximal phalanx maintains preserved fatty T1 marrow signal. No fifth MTP joint effusion. Remaining osseous structures are intact with preserved marrow signal. No fracture or dislocation.   Ligaments   Intact Lisfranc ligament. Collateral ligaments of the forefoot appear intact.   Muscles and Tendons   Chronic denervation changes of the intrinsic foot musculature. Flexor and extensor tendons intact.   Soft tissues   Subtle soft tissue ulceration at the plantar lateral aspect of the fifth toe. Surrounding soft tissue edema. Prominent subcutaneous edema over the dorsum of the foot. Slightly more focal fluid collection within the superficial soft tissues overlying the fourth metatarsal diaphysis measuring 1.8 x 0.4 x 1.1 cm (series 11, image 15; series  8, image 9).   IMPRESSION: 1. Acute osteomyelitis of the proximal and distal phalanx of the right fifth toe. 2. Subtle soft tissue ulceration at the plantar lateral aspect of the fifth toe with surrounding soft tissue edema, likely cellulitis. 3. Dorsal subcutaneous edema with slightly more focal fluid collection within the superficial soft tissues overlying the fourth metatarsal diaphysis measuring 1.8 x 0.4 x 1.1 cm, which may represent a small abscess.   These results will be called to the ordering clinician or representative by the Radiologist Assistant, and communication documented in the PACS or [employer].     Electronically Signed   By: Duanne Guess D.O.   On: 12/14/2020 08:44      Radiographs: X-ray of the right foot: no fracture, dislocation, swelling or degenerative changes noted and no soft tissue emphysema, no signs of osteomyelitis Assessment:   1. Diabetic ulcer of toe of right foot associated with type 2 diabetes mellitus, with fat layer exposed (HCC)       Plan:  Patient was evaluated and treated and all questions answered.  Patient educated on diabetes. Discussed proper diabetic foot care and discussed risks and complications of disease. Educated patient in depth on reasons to return to the office immediately should he/she discover anything concerning or new on the feet. All questions answered. Discussed proper shoes as well.    Ulcer right fifth toe -Wound has healed, continue to monitor for recurrence or issues with either fourth or fifth toe.  Return as needed        Return if symptoms worsen or fail to improve.

## 2021-02-19 ENCOUNTER — Other Ambulatory Visit: Payer: Self-pay | Admitting: Podiatry

## 2021-02-27 ENCOUNTER — Ambulatory Visit (INDEPENDENT_AMBULATORY_CARE_PROVIDER_SITE_OTHER): Payer: Medicare HMO | Admitting: Vascular Surgery

## 2021-02-28 DIAGNOSIS — M654 Radial styloid tenosynovitis [de Quervain]: Secondary | ICD-10-CM | POA: Diagnosis not present

## 2021-03-02 ENCOUNTER — Other Ambulatory Visit: Payer: Self-pay | Admitting: Family Medicine

## 2021-03-13 ENCOUNTER — Other Ambulatory Visit: Payer: Self-pay

## 2021-03-13 ENCOUNTER — Encounter (INDEPENDENT_AMBULATORY_CARE_PROVIDER_SITE_OTHER): Payer: Self-pay | Admitting: Vascular Surgery

## 2021-03-13 ENCOUNTER — Ambulatory Visit (INDEPENDENT_AMBULATORY_CARE_PROVIDER_SITE_OTHER): Payer: Medicare HMO | Admitting: Vascular Surgery

## 2021-03-13 VITALS — BP 156/86 | HR 76 | Resp 16 | Wt 280.4 lb

## 2021-03-13 DIAGNOSIS — E7800 Pure hypercholesterolemia, unspecified: Secondary | ICD-10-CM

## 2021-03-13 DIAGNOSIS — I10 Essential (primary) hypertension: Secondary | ICD-10-CM | POA: Diagnosis not present

## 2021-03-13 DIAGNOSIS — E118 Type 2 diabetes mellitus with unspecified complications: Secondary | ICD-10-CM | POA: Diagnosis not present

## 2021-03-13 DIAGNOSIS — I890 Lymphedema, not elsewhere classified: Secondary | ICD-10-CM

## 2021-03-13 NOTE — Progress Notes (Signed)
MRN : [id number]  Linda Briggs is a 65 y.o. (1956/07/18) female who presents with chief complaint of  Chief Complaint  Patient presents with   Follow-up    6 month follow up  . History of Present Illness: Patient returns today in follow up of her lymphedema. She has been using her lymphedema pump regular. Stockings intermittently. Her legs are better.  Not perfect, but noticeably less swollen.  She also says a recent course of steroids helped her feet and legs feel better as well.  Current Outpatient Medications  Medication Sig Dispense Refill   ACCU-CHEK GUIDE test strip TEST BLOOD SUGAR THREE TIMES DAILY 300 strip 1   Accu-Chek Softclix Lancets lancets TEST BLOOD SUGAR THREE TIMES DAILY 300 each 1   Alcohol Swabs (B-D SINGLE USE SWABS REGULAR) PADS USE THREE TIMES DAILY 300 each 1   aspirin EC 81 MG tablet Take 81 mg by mouth daily.     Blood Glucose Calibration (TRUE METRIX LEVEL 2) Normal SOLN Use as directed to monitor FSBS 3x daily. Dx: E11.65 1 each 1   Blood Glucose Monitoring Suppl (TRUE METRIX AIR GLUCOSE METER) DEVI 1 each by Does not apply route daily. Use as directed to monitor FSBS 3x daily. Dx: E11.65 1 each 5   FARXIGA 10 MG TABS tablet TAKE 1 TABLET EVERY DAY BEFORE BREAKFAST 90 tablet 3   furosemide (LASIX) 20 MG tablet TAKE 1 TABLET EVERY DAY 90 tablet 0   gabapentin (NEURONTIN) 300 MG capsule TAKE 1 CAPSULE THREE TIMES DAILY 270 capsule 1   glipiZIDE (GLUCOTROL XL) 5 MG 24 hr tablet TAKE 1 TABLET  DAILY WITH BREAKFAST. 90 tablet 1   lisinopril (ZESTRIL) 5 MG tablet Take 1 tablet (5 mg total) by mouth daily. 90 tablet 3   metFORMIN (GLUCOPHAGE) 1000 MG tablet TAKE 1 TABLET TWICE DAILY WITH A MEAL 180 tablet 1   metoprolol tartrate (LOPRESSOR) 50 MG tablet Take 0.5 tablets (25 mg total) by mouth 2 (two) times daily. 90 tablet 0   Multiple Vitamin (MULTIVITAMIN) tablet Take 1 tablet by mouth daily.     mupirocin ointment (BACTROBAN) 2 % Apply 1 application topically  2 (two) times daily. 30 g 2   pantoprazole (PROTONIX) 40 MG tablet Take 1 tablet (40 mg total) by mouth 2 (two) times daily before a meal. 180 tablet 2   potassium chloride (KLOR-CON) 10 MEQ tablet TAKE 1 TABLET EVERY DAY 90 tablet 1   pravastatin (PRAVACHOL) 40 MG tablet TAKE 1 TABLET EVERY DAY 90 tablet 3   traMADol (ULTRAM) 50 MG tablet TAKE 1 TABLET BY MOUTH EVERY 6 HOURS AS NEEDED FOR  CHRONIC  BACK  PAIN 60 tablet 0   TRULICITY 1.5 MG/0.5ML SOPN INJECT 1.5MG  (1 PEN) SUBCUTANEOUSLY EVERY WEEK AS DIRECTED 12 mL 1   diclofenac (FLECTOR) 1.3 % PTCH Place 1 patch onto the skin 2 (two) times daily. (Patient not taking: No sig reported) 60 patch 0   ondansetron (ZOFRAN) 4 MG tablet Take 1 tablet (4 mg total) by mouth every 8 (eight) hours as needed for nausea or vomiting. (Patient not taking: No sig reported) 60 tablet 3   pioglitazone (ACTOS) 30 MG tablet TAKE 1 TABLET EVERY DAY (Patient not taking: No sig reported) 90 tablet 1   No current facility-administered medications for this visit.    Past Medical History:  Diagnosis Date   Arthritis    feet & toes   Bulging disc    Diabetes mellitus  Gastroparesis    History of hepatitis B    Hyperlipidemia    Hypertension    Neuromuscular disorder (HCC)    in feet and fingers    Neuropathy    feet   Nonproliferative diabetic retinopathy (HCC)    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    Peripheral vascular disease (HCC)    Shortness of breath dyspnea    on lopressor    Sleep apnea    pt had study done but it was incomplete due to blood sugar issues     Past Surgical History:  Procedure Laterality Date   BIOPSY  04/28/2018   Procedure: BIOPSY;  Surgeon: Meryl Dare, MD;  Location: [facility];  Service: Endoscopy;;   BIOPSY THYROID     january 2016   ESOPHAGOGASTRODUODENOSCOPY (EGD) WITH PROPOFOL N/A 04/28/2018   Procedure: ESOPHAGOGASTRODUODENOSCOPY (EGD) WITH PROPOFOL;  Surgeon: Meryl Dare, MD;  Location: [facility];  Service: Endoscopy;  Laterality: N/A;   HYSTEROSCOPY WITH D & C N/A 02/15/2015   Procedure: DILATATION AND CURETTAGE /HYSTEROSCOPY;  Surgeon: Mitchel Honour, DO;  Location: [facility];  Service: Gynecology;  Laterality: N/A;   TONSILLECTOMY       Social History   Tobacco Use   Smoking status: Never   Smokeless tobacco: Never  Vaping Use   Vaping Use: Never used  Substance Use Topics   Alcohol use: No    Comment: social drinker.   Drug use: No      Family History  Problem Relation Age of Onset   Hyperlipidemia Mother    Heart disease Father    Hyperlipidemia Father    Hypertension Father    Heart disease Brother    Kidney disease Sister    Kidney cancer Sister    Ovarian cancer Maternal Aunt    Kidney disease Maternal Aunt        x 2   Kidney disease Maternal Uncle        x 2   Stroke Brother    Heart disease Brother    Colon cancer Neg Hx    Stomach cancer Neg Hx      Allergies  Allergen Reactions   Codeine Anaphylaxis and Hives   Penicillins Anaphylaxis, Hives and Other (See Comments)    Has patient had a PCN reaction causing immediate rash, facial/tongue/throat swelling, SOB or lightheadedness with hypotension: No Has patient had a PCN reaction causing severe rash involving mucus membranes or skin necrosis: No Has patient had a PCN reaction that required hospitalization: No Has patient had a PCN reaction occurring within the last 10 years: No If all of the above answers are "NO", then may proceed with Cephalosporin use.    Ciprofloxacin Other (See Comments)    Tongue Swelling   Food Nausea And Vomiting and Other (See Comments)    Olives    Januvia [Sitagliptin] Other (See Comments)    Stomach cramps   Latex Hives      REVIEW OF SYSTEMS (Negative unless checked)   Constitutional: [] Weight loss  [] Fever  [] Chills Cardiac: [] Chest pain   [] Chest pressure   [] Palpitations   [] Shortness of breath when laying flat   [x] Shortness of breath at rest    [x] Shortness of breath with exertion. Vascular:  [] Pain in legs with walking   [] Pain in legs at rest   [] Pain in legs when laying flat   [] Claudication   [] Pain in feet when walking  [] Pain in feet at rest  [] Pain  in feet when laying flat   [] History of DVT   [] Phlebitis   [x] Swelling in legs   [] Varicose veins   [] Non-healing ulcers Pulmonary:   [] Uses home oxygen   [] Productive cough   [] Hemoptysis   [] Wheeze  [] COPD   [] Asthma Neurologic:  [] Dizziness  [] Blackouts   [] Seizures   [] History of stroke   [] History of TIA  [] Aphasia   [] Temporary blindness   [] Dysphagia   [] Weakness or numbness in arms   [] Weakness or numbness in legs Musculoskeletal:  [x] Arthritis   [] Joint swelling   [] Joint pain   [x] Low back pain Hematologic:  [] Easy bruising  [] Easy bleeding   [] Hypercoagulable state   [] Anemic  [] Hepatitis Gastrointestinal:  [] Blood in stool   [] Vomiting blood  [] Gastroesophageal reflux/heartburn   [] Abdominal pain Genitourinary:  [] Chronic kidney disease   [] Difficult urination  [] Frequent urination  [] Burning with urination   [] Hematuria Skin:  [] Rashes   [] Ulcers   [] Wounds Psychological:  [] History of anxiety   []  History of major depression.  Physical Examination  BP (!) 156/86 (BP Location: Right Arm)   Pulse 76   Resp 16   Wt 280 lb 6.4 oz (127.2 kg)   BMI 45.26 kg/m  Gen:  WD/WN, NAD Head: [place]/AT, No temporalis wasting. Ear/Nose/Throat: Hearing grossly intact, nares w/o erythema or drainage Eyes: Conjunctiva clear. Sclera non-icteric Neck: Supple.  Trachea midline Pulmonary:  Good air movement, no use of accessory muscles.  Cardiac: RRR, no JVD Vascular:  Vessel Right Left  Radial Palpable Palpable           Musculoskeletal: M/S 5/5 throughout.  No deformity or atrophy. 1+ BLE edema. Neurologic: Sensation grossly intact in extremities.  Symmetrical.  Speech is fluent.  Psychiatric: Judgment intact, Mood & affect appropriate for pt's clinical situation. Dermatologic: No  rashes or ulcers noted.  No cellulitis or open wounds.      Labs No results found for this or any previous visit (from the past 2160 hour(s)).  Radiology No results found.  Assessment/Plan Hyperlipidemia lipid control important in reducing the progression of atherosclerotic disease. Continue statin therapy     Type 2 diabetes mellitus with complications blood glucose control important in reducing the progression of atherosclerotic disease. Also, involved in wound healing. On appropriate medications.     Essential hypertension blood pressure control important in reducing the progression of atherosclerotic disease. On appropriate oral medications.  Lymphedema Symptoms are overall improved.  This correlates with weight loss, the use of her pumps, and increasing activity.  Continue these regimens and get compression as tolerated more frequently.  Return to clinic in 1 year    , MD  03/13/2021 2:08 PM    This note was created with Dragon medical transcription system.  Any errors from dictation are purely unintentional

## 2021-03-13 NOTE — Assessment & Plan Note (Signed)
Symptoms are overall improved.  This correlates with weight loss, the use of her pumps, and increasing activity.  Continue these regimens and get compression as tolerated more frequently.  Return to clinic in 1 year

## 2021-03-20 ENCOUNTER — Other Ambulatory Visit: Payer: Self-pay

## 2021-03-20 ENCOUNTER — Other Ambulatory Visit: Payer: Medicare HMO

## 2021-03-20 DIAGNOSIS — E041 Nontoxic single thyroid nodule: Secondary | ICD-10-CM | POA: Diagnosis not present

## 2021-03-20 DIAGNOSIS — Z794 Long term (current) use of insulin: Secondary | ICD-10-CM | POA: Diagnosis not present

## 2021-03-20 DIAGNOSIS — I10 Essential (primary) hypertension: Secondary | ICD-10-CM

## 2021-03-20 DIAGNOSIS — Z13220 Encounter for screening for lipoid disorders: Secondary | ICD-10-CM

## 2021-03-20 DIAGNOSIS — E118 Type 2 diabetes mellitus with unspecified complications: Secondary | ICD-10-CM

## 2021-03-20 DIAGNOSIS — E7800 Pure hypercholesterolemia, unspecified: Secondary | ICD-10-CM

## 2021-03-20 DIAGNOSIS — Z136 Encounter for screening for cardiovascular disorders: Secondary | ICD-10-CM | POA: Diagnosis not present

## 2021-03-21 ENCOUNTER — Other Ambulatory Visit: Payer: Self-pay | Admitting: Family Medicine

## 2021-03-21 LAB — COMPLETE METABOLIC PANEL WITH GFR
AG Ratio: 1.7 (calc) (ref 1.0–2.5)
ALT: 13 U/L (ref 6–29)
AST: 15 U/L (ref 10–35)
Albumin: 3.9 g/dL (ref 3.6–5.1)
Alkaline phosphatase (APISO): 59 U/L (ref 37–153)
BUN: 12 mg/dL (ref 7–25)
CO2: 25 mmol/L (ref 20–32)
Calcium: 8.7 mg/dL (ref 8.6–10.4)
Chloride: 104 mmol/L (ref 98–110)
Creat: 0.95 mg/dL (ref 0.50–1.05)
Globulin: 2.3 g/dL (calc) (ref 1.9–3.7)
Glucose, Bld: 154 mg/dL — ABNORMAL HIGH (ref 65–99)
Potassium: 3.9 mmol/L (ref 3.5–5.3)
Sodium: 140 mmol/L (ref 135–146)
Total Bilirubin: 0.9 mg/dL (ref 0.2–1.2)
Total Protein: 6.2 g/dL (ref 6.1–8.1)
eGFR: 67 mL/min/{1.73_m2} (ref >=60)

## 2021-03-21 LAB — LIPID PANEL
Cholesterol: 161 mg/dL (ref <200)
HDL: 29 mg/dL — ABNORMAL LOW (ref >=50)
LDL Cholesterol (Calc): 100 mg/dL (calc) — ABNORMAL HIGH
Non-HDL Cholesterol (Calc): 132 mg/dL (calc) — ABNORMAL HIGH (ref <130)
Total CHOL/HDL Ratio: 5.6 (calc) — ABNORMAL HIGH (ref <5.0)
Triglycerides: 197 mg/dL — ABNORMAL HIGH (ref <150)

## 2021-03-21 LAB — CBC WITH DIFFERENTIAL/PLATELET
Absolute Monocytes: 400 cells/uL (ref 200–950)
Basophils Absolute: 64 cells/uL (ref 0–200)
Basophils Relative: 0.7 %
Eosinophils Absolute: 273 cells/uL (ref 15–500)
Eosinophils Relative: 3 %
HCT: 40.7 % (ref 35.0–45.0)
Hemoglobin: 13.5 g/dL (ref 11.7–15.5)
Lymphs Abs: 2184 cells/uL (ref 850–3900)
MCH: 31 pg (ref 27.0–33.0)
MCHC: 33.2 g/dL (ref 32.0–36.0)
MCV: 93.3 fL (ref 80.0–100.0)
MPV: 10.3 fL (ref 7.5–12.5)
Monocytes Relative: 4.4 %
Neutro Abs: 6179 cells/uL (ref 1500–7800)
Neutrophils Relative %: 67.9 %
Platelets: 113 10*3/uL — ABNORMAL LOW (ref 140–400)
RBC: 4.36 10*6/uL (ref 3.80–5.10)
RDW: 13.4 % (ref 11.0–15.0)
Total Lymphocyte: 24 %
WBC: 9.1 10*3/uL (ref 3.8–10.8)

## 2021-03-21 LAB — HEMOGLOBIN A1C
Hgb A1c MFr Bld: 5.9 % of total Hgb — ABNORMAL HIGH (ref <5.7)
Mean Plasma Glucose: 123 mg/dL
eAG (mmol/L): 6.8 mmol/L

## 2021-03-21 LAB — TSH: TSH: 2.41 mIU/L (ref 0.40–4.50)

## 2021-03-26 ENCOUNTER — Encounter: Payer: Self-pay | Admitting: Family Medicine

## 2021-03-26 ENCOUNTER — Other Ambulatory Visit: Payer: Self-pay

## 2021-03-26 ENCOUNTER — Ambulatory Visit (INDEPENDENT_AMBULATORY_CARE_PROVIDER_SITE_OTHER): Payer: Medicare HMO | Admitting: Family Medicine

## 2021-03-26 VITALS — BP 136/76 | HR 82 | Temp 98.1°F | Resp 16 | Ht 66.0 in | Wt 276.0 lb

## 2021-03-26 DIAGNOSIS — E7800 Pure hypercholesterolemia, unspecified: Secondary | ICD-10-CM

## 2021-03-26 DIAGNOSIS — I10 Essential (primary) hypertension: Secondary | ICD-10-CM | POA: Diagnosis not present

## 2021-03-26 DIAGNOSIS — E118 Type 2 diabetes mellitus with unspecified complications: Secondary | ICD-10-CM

## 2021-03-26 NOTE — Progress Notes (Signed)
Subjective:    Patient ID: Linda Briggs, female    DOB: 1956-01-30, 65 y.o.   MRN: [id number]  Since I last saw the patient, her sister, Mariann Laster, passed away.  She was battling aggressive colon cancer and then suffered a major stroke.  Patient is still grieving.  However she is doing remarkably well from a physical standpoint.  She is exercising now and has lost almost 30 pounds!  Her hemoglobin A1c has dropped dramatically from 6.8-5.9.  She is occasionally having hypoglycemic episodes.  She is currently using a combination of Farxiga, Trelegy, and glipizide.  She denies any chest pain shortness of breath or dyspnea on exertion.  She is overdue for a mammogram.  She is long overdue for a colonoscopy.  She declines both of these today despite my insistence. Lab on 03/20/2021  Component Date Value Ref Range Status   WBC 03/20/2021 9.1  3.8 - 10.8 Thousand/uL Final   RBC 03/20/2021 4.36  3.80 - 5.10 Million/uL Final   Hemoglobin 03/20/2021 13.5  11.7 - 15.5 g/dL Final   HCT 03/20/2021 40.7  35.0 - 45.0 % Final   MCV 03/20/2021 93.3  80.0 - 100.0 fL Final   MCH 03/20/2021 31.0  27.0 - 33.0 pg Final   MCHC 03/20/2021 33.2  32.0 - 36.0 g/dL Final   RDW 03/20/2021 13.4  11.0 - 15.0 % Final   Platelets 03/20/2021 113 (A) 140 - 400 Thousand/uL Final   MPV 03/20/2021 10.3  7.5 - 12.5 fL Final   Neutro Abs 03/20/2021 6,179  1,500 - 7,800 cells/uL Final   Lymphs Abs 03/20/2021 2,184  850 - 3,900 cells/uL Final   Absolute Monocytes 03/20/2021 400  200 - 950 cells/uL Final   Eosinophils Absolute 03/20/2021 273  15 - 500 cells/uL Final   Basophils Absolute 03/20/2021 64  0 - 200 cells/uL Final   Neutrophils Relative % 03/20/2021 67.9  % Final   Total Lymphocyte 03/20/2021 24.0  % Final   Monocytes Relative 03/20/2021 4.4  % Final   Eosinophils Relative 03/20/2021 3.0  % Final   Basophils Relative 03/20/2021 0.7  % Final   Smear Review 03/20/2021    Final   Comment: No platelet clumps seen. Review  of peripheral smear confirms automated results.    Glucose, Bld 03/20/2021 154 (A) 65 - 99 mg/dL Final   Comment: .            Fasting reference interval . For someone without known diabetes, a glucose value >125 mg/dL indicates that they may have diabetes and this should be confirmed with a follow-up test. .    BUN 03/20/2021 12  7 - 25 mg/dL Final   Creat 03/20/2021 0.95  0.50 - 1.05 mg/dL Final   eGFR 03/20/2021 67  > OR = 60 mL/min/1.58m Final   Comment: The eGFR is based on the CKD-EPI 2021 equation. To calculate  the new eGFR from a previous Creatinine or Cystatin C result, go to https://www.kidney.org/professionals/ kdoqi/gfr%5Fcalculator    BUN/Creatinine Ratio 033/35/4562NOT APPLICABLE  6 - 22 (calc) Final   Sodium 03/20/2021 140  135 - 146 mmol/L Final   Potassium 03/20/2021 3.9  3.5 - 5.3 mmol/L Final   Chloride 03/20/2021 104  98 - 110 mmol/L Final   CO2 03/20/2021 25  20 - 32 mmol/L Final   Calcium 03/20/2021 8.7  8.6 - 10.4 mg/dL Final   Total Protein 03/20/2021 6.2  6.1 - 8.1 g/dL Final   Albumin 03/20/2021 3.9  3.6 - 5.1 g/dL Final   Globulin 03/20/2021 2.3  1.9 - 3.7 g/dL (calc) Final   AG Ratio 03/20/2021 1.7  1.0 - 2.5 (calc) Final   Total Bilirubin 03/20/2021 0.9  0.2 - 1.2 mg/dL Final   Alkaline phosphatase (APISO) 03/20/2021 59  37 - 153 U/L Final   AST 03/20/2021 15  10 - 35 U/L Final   ALT 03/20/2021 13  6 - 29 U/L Final   Hgb A1c MFr Bld 03/20/2021 5.9 (A) <5.7 % of total Hgb Final   Comment: For someone without known diabetes, a hemoglobin  A1c value between 5.7% and 6.4% is consistent with prediabetes and should be confirmed with a  follow-up test. . For someone with known diabetes, a value <7% indicates that their diabetes is well controlled. A1c targets should be individualized based on duration of diabetes, age, comorbid conditions, and other considerations. . This assay result is consistent with an increased risk of  diabetes. . Currently, no consensus exists regarding use of hemoglobin A1c for diagnosis of diabetes for children. .    Mean Plasma Glucose 03/20/2021 123  mg/dL Final   eAG (mmol/L) 03/20/2021 6.8  mmol/L Final   Cholesterol 03/20/2021 161  <200 mg/dL Final   HDL 03/20/2021 29 (A) > OR = 50 mg/dL Final   Triglycerides 03/20/2021 197 (A) <150 mg/dL Final   LDL Cholesterol (Calc) 03/20/2021 100 (A) mg/dL (calc) Final   Comment: Reference range: <100 . Desirable range <100 mg/dL for primary prevention;   <70 mg/dL for patients with CHD or diabetic patients  with > or = 2 CHD risk factors. Marland Kitchen LDL-C is now calculated using the Martin-Hopkins  calculation, which is a validated novel method providing  better accuracy than the Friedewald equation in the  estimation of LDL-C.  Cresenciano Genre et al. Annamaria Helling. 2725;366(44): 2061-2068  (http://education.[facility].com/faq/FAQ164)    Total CHOL/HDL Ratio 03/20/2021 5.6 (A) <5.0 (calc) Final   Non-HDL Cholesterol (Calc) 03/20/2021 132 (A) <130 mg/dL (calc) Final   Comment: For patients with diabetes plus 1 major ASCVD risk  factor, treating to a non-HDL-C goal of <100 mg/dL  (LDL-C of <70 mg/dL) is considered a therapeutic  option.    TSH 03/20/2021 2.41  0.40 - 4.50 mIU/L Final    Past Medical History:  Diagnosis Date   Arthritis    feet & toes   Bulging disc    Diabetes mellitus    Gastroparesis    History of hepatitis B    Hyperlipidemia    Hypertension    Neuromuscular disorder (HCC)    in feet and fingers    Neuropathy    feet   Nonproliferative diabetic retinopathy (HCC)    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    Peripheral vascular disease (HCC)    Shortness of breath dyspnea    on lopressor    Sleep apnea    pt had study done but it was incomplete due to blood sugar issues    Past Surgical History:  Procedure Laterality Date   BIOPSY  04/28/2018   Procedure: BIOPSY;  Surgeon: Ladene Artist, MD;  Location: [facility]  ENDOSCOPY;  Service: Endoscopy;;   BIOPSY THYROID     january 2016   ESOPHAGOGASTRODUODENOSCOPY (EGD) WITH PROPOFOL N/A 04/28/2018   Procedure: ESOPHAGOGASTRODUODENOSCOPY (EGD) WITH PROPOFOL;  Surgeon: Ladene Artist, MD;  Location: [facility];  Service: Endoscopy;  Laterality: N/A;   HYSTEROSCOPY WITH D & C N/A 02/15/2015   Procedure: DILATATION AND CURETTAGE /  HYSTEROSCOPY;  Surgeon: Linda Hedges, DO;  Location: [facility];  Service: Gynecology;  Laterality: N/A;   TONSILLECTOMY     Current Outpatient Medications on File Prior to Visit  Medication Sig Dispense Refill   ACCU-CHEK GUIDE test strip TEST BLOOD SUGAR THREE TIMES DAILY 300 strip 1   Accu-Chek Softclix Lancets lancets TEST BLOOD SUGAR THREE TIMES DAILY 300 each 1   Alcohol Swabs (B-D SINGLE USE SWABS REGULAR) PADS USE THREE TIMES DAILY 300 each 1   aspirin EC 81 MG tablet Take 81 mg by mouth daily.     Blood Glucose Calibration (TRUE METRIX LEVEL 2) Normal SOLN Use as directed to monitor FSBS 3x daily. Dx: E11.65 1 each 1   Blood Glucose Monitoring Suppl (TRUE METRIX AIR GLUCOSE METER) DEVI 1 each by Does not apply route daily. Use as directed to monitor FSBS 3x daily. Dx: E11.65 1 each 5   diclofenac (FLECTOR) 1.3 % PTCH Place 1 patch onto the skin 2 (two) times daily. (Patient not taking: No sig reported) 60 patch 0   FARXIGA 10 MG TABS tablet TAKE 1 TABLET EVERY DAY BEFORE BREAKFAST 90 tablet 3   furosemide (LASIX) 20 MG tablet TAKE 1 TABLET EVERY DAY 90 tablet 0   gabapentin (NEURONTIN) 300 MG capsule TAKE 1 CAPSULE THREE TIMES DAILY 270 capsule 1   glipiZIDE (GLUCOTROL XL) 5 MG 24 hr tablet TAKE 1 TABLET  DAILY WITH BREAKFAST. 90 tablet 1   lisinopril (ZESTRIL) 5 MG tablet Take 1 tablet (5 mg total) by mouth daily. 90 tablet 3   metFORMIN (GLUCOPHAGE) 1000 MG tablet TAKE 1 TABLET TWICE DAILY WITH A MEAL 180 tablet 1   metoprolol tartrate (LOPRESSOR) 50 MG tablet Take 0.5 tablets (25 mg total) by mouth 2 (two) times daily. 90  tablet 0   Multiple Vitamin (MULTIVITAMIN) tablet Take 1 tablet by mouth daily.     mupirocin ointment (BACTROBAN) 2 % Apply 1 application topically 2 (two) times daily. 30 g 2   ondansetron (ZOFRAN) 4 MG tablet Take 1 tablet (4 mg total) by mouth every 8 (eight) hours as needed for nausea or vomiting. (Patient not taking: No sig reported) 60 tablet 3   pantoprazole (PROTONIX) 40 MG tablet Take 1 tablet (40 mg total) by mouth 2 (two) times daily before a meal. 180 tablet 2   pioglitazone (ACTOS) 30 MG tablet TAKE 1 TABLET EVERY DAY (Patient not taking: No sig reported) 90 tablet 1   potassium chloride (KLOR-CON) 10 MEQ tablet TAKE 1 TABLET EVERY DAY 90 tablet 1   pravastatin (PRAVACHOL) 40 MG tablet TAKE 1 TABLET EVERY DAY 90 tablet 3   traMADol (ULTRAM) 50 MG tablet TAKE 1 TABLET BY MOUTH EVERY 6 HOURS AS NEEDED FOR  CHRONIC  BACK  PAIN 60 tablet 0   TRULICITY 1.5 JX/9.1YN SOPN INJECT 1.5MG (1 PEN) SUBCUTANEOUSLY EVERY WEEK AS DIRECTED 12 mL 1   No current facility-administered medications on file prior to visit.   Allergies  Allergen Reactions   Codeine Anaphylaxis and Hives   Penicillins Anaphylaxis, Hives and Other (See Comments)    Has patient had a PCN reaction causing immediate rash, facial/tongue/throat swelling, SOB or lightheadedness with hypotension: No Has patient had a PCN reaction causing severe rash involving mucus membranes or skin necrosis: No Has patient had a PCN reaction that required hospitalization: No Has patient had a PCN reaction occurring within the last 10 years: No If all of the above answers are "NO", then may proceed  with Cephalosporin use.    Ciprofloxacin Other (See Comments)    Tongue Swelling   Food Nausea And Vomiting and Other (See Comments)    Quentin Cornwall    Januvia [Sitagliptin] Other (See Comments)    Stomach cramps   Latex Hives   Social History   Socioeconomic History   Marital status: Single    Spouse name: Not on file   Number of children: 0    Years of education: Not on file   Highest education level: Not on file  Occupational History   Occupation: retired  Tobacco Use   Smoking status: Never   Smokeless tobacco: Never  Vaping Use   Vaping Use: Never used  Substance and Sexual Activity   Alcohol use: No    Comment: social drinker.   Drug use: No   Sexual activity: Not on file  Other Topics Concern   Not on file  Social History Narrative   Not on file   Social Determinants of Health   Financial Resource Strain: Not on file  Food Insecurity: Not on file  Transportation Needs: Not on file  Physical Activity: Not on file  Stress: Not on file  Social Connections: Not on file  Intimate Partner Violence: Not on file   Family History  Problem Relation Age of Onset   Hyperlipidemia Mother    Heart disease Father    Hyperlipidemia Father    Hypertension Father    Heart disease Brother    Kidney disease Sister    Kidney cancer Sister    Ovarian cancer Maternal Aunt    Kidney disease Maternal Aunt        x 2   Kidney disease Maternal Uncle        x 2   Stroke Brother    Heart disease Brother    Colon cancer Neg Hx    Stomach cancer Neg Hx       Review of Systems  All other systems reviewed and are negative.     Objective:   Physical Exam Vitals reviewed.  Constitutional:      General: She is not in acute distress.    Appearance: She is well-developed. She is not diaphoretic.  HENT:     Head: Normocephalic and atraumatic.     Right Ear: External ear normal.     Left Ear: External ear normal.     Nose: Nose normal.     Mouth/Throat:     Pharynx: No oropharyngeal exudate.  Eyes:     General: No scleral icterus.       Right eye: No discharge.        Left eye: No discharge.     Conjunctiva/sclera: Conjunctivae normal.     Pupils: Pupils are equal, round, and reactive to light.  Neck:     Thyroid: No thyromegaly.     Vascular: No JVD.     Trachea: No tracheal deviation.  Cardiovascular:      Rate and Rhythm: Normal rate and regular rhythm.     Heart sounds: Normal heart sounds. No murmur heard.   No friction rub. No gallop.  Pulmonary:     Effort: Pulmonary effort is normal. No respiratory distress.     Breath sounds: Normal breath sounds. No stridor. No wheezing or rales.  Chest:     Chest wall: No tenderness.  Abdominal:     General: Bowel sounds are normal. There is no distension.     Palpations: Abdomen is soft. There is no  mass.     Tenderness: There is no abdominal tenderness. There is no guarding or rebound.  Musculoskeletal:        General: No tenderness. Normal range of motion.     Cervical back: Normal range of motion and neck supple.  Lymphadenopathy:     Cervical: No cervical adenopathy.  Skin:    General: Skin is warm.     Coloration: Skin is not pale.     Findings: No erythema or rash.  Neurological:     Mental Status: She is alert and oriented to person, place, and time.     Cranial Nerves: No cranial nerve deficit.     Motor: No abnormal muscle tone.     Coordination: Coordination normal.     Deep Tendon Reflexes: Reflexes are normal and symmetric.  Psychiatric:        Behavior: Behavior normal.        Thought Content: Thought content normal.        Judgment: Judgment normal.     Wt Readings from Last 3 Encounters:  03/13/21 280 lb 6.4 oz (127.2 kg)  09/25/20 (!) 302 lb (137 kg)  09/08/20 (!) 303 lb (137.4 kg)        Assessment & Plan:   Type 2 diabetes mellitus with complications ([place])  Pure hypercholesterolemia  Morbid obesity ([place])  Essential hypertension I offer the patient my condolences over the death of her sister.  However I congratulated her on how hard she is worked on her weight loss and the success that she has had.  As result, discontinue glipizide due to hypoglycemia as well as a well-controlled hemoglobin A1c continue [place] and Trulicity.  Blood pressure today is outstanding.  Cholesterol is acceptable with an LDL of 100.   HDL remains low and I continue to encourage aerobic exercise to achieve better control of this.  Recommended a mammogram and a colonoscopy but the patient declined

## 2021-03-29 ENCOUNTER — Other Ambulatory Visit: Payer: Self-pay | Admitting: Family Medicine

## 2021-04-03 ENCOUNTER — Other Ambulatory Visit: Payer: Self-pay | Admitting: Family Medicine

## 2021-04-05 ENCOUNTER — Other Ambulatory Visit: Payer: Self-pay | Admitting: *Deleted

## 2021-04-05 MED ORDER — METFORMIN HCL 1000 MG PO TABS: ORAL_TABLET | ORAL | 1 refills | Status: DC

## 2021-05-04 ENCOUNTER — Other Ambulatory Visit: Payer: Self-pay | Admitting: Family Medicine

## 2021-05-09 ENCOUNTER — Other Ambulatory Visit: Payer: Self-pay | Admitting: Family Medicine

## 2021-05-16 ENCOUNTER — Telehealth: Payer: Self-pay | Admitting: *Deleted

## 2021-05-16 ENCOUNTER — Telehealth: Payer: Self-pay | Admitting: Pharmacist

## 2021-05-16 NOTE — Progress Notes (Addendum)
Chronic Care Management Pharmacy Assistant   Name: Linda Briggs  MRN: [id number] DOB: 22-Jun-1956  Reason for Encounter: Disease State For DM.    Conditions to be addressed/monitored: Hypertension, Type II DM with retinopathy, hyperlipidemia  Recent office visits:  03/26/21 Dr. Tanya Nones For follow-up. No medication changes.   Recent consult visits: 03/13/21 Vascular Surgery Dew, Marlow Baars, MD. For follow-up. No medication changes.  02/28/21 Orthopaedic Surgery Ross Marcus. For Tenosynovitis of Left Radial Styloid. No medication changes.  02/12/21 Podiatry McDonald, Rachelle Hora, DPM. For Diabetic Ulcer. No medication changes.  01/15/21 Podiatry McDonald, Rachelle Hora, DPM. For wound check. No medication changes.  12/20/20 Podiatry McDonald, Rachelle Hora, DPM. For foot ulcer. STARTED Doxycycline 100 mg 2 times daily.  12/13/20 Ophthalmology Wynell Balloon D.    11/29/20 Podiatry McDonald, Rachelle Hora, DPM. For foot ulcer. No medication changes.   Hospital visits:  None since 11/14/20.   Medications: Outpatient Encounter Medications as of 05/16/2021  Medication Sig   ACCU-CHEK GUIDE test strip TEST BLOOD SUGAR THREE TIMES DAILY   Accu-Chek Softclix Lancets lancets TEST BLOOD SUGAR THREE TIMES DAILY   Alcohol Swabs (DROPSAFE ALCOHOL PREP) 70 % PADS USE THREE TIMES DAILY   aspirin EC 81 MG tablet Take 81 mg by mouth daily.   Blood Glucose Calibration (TRUE METRIX LEVEL 2) Normal SOLN Use as directed to monitor FSBS 3x daily. Dx: E11.65   Blood Glucose Monitoring Suppl (TRUE METRIX AIR GLUCOSE METER) DEVI 1 each by Does not apply route daily. Use as directed to monitor FSBS 3x daily. Dx: E11.65   FARXIGA 10 MG TABS tablet TAKE 1 TABLET EVERY DAY BEFORE BREAKFAST   furosemide (LASIX) 20 MG tablet TAKE 1 TABLET EVERY DAY   gabapentin (NEURONTIN) 300 MG capsule TAKE 1 CAPSULE THREE TIMES DAILY   glipiZIDE (GLUCOTROL XL) 5 MG 24 hr tablet TAKE 1 TABLET  DAILY WITH BREAKFAST.   lisinopril (ZESTRIL) 5 MG  tablet TAKE 1 TABLET (5 MG TOTAL) BY MOUTH DAILY.   metFORMIN (GLUCOPHAGE) 1000 MG tablet TAKE 1 TABLET TWICE DAILY WITH A MEAL   metoprolol tartrate (LOPRESSOR) 50 MG tablet Take 0.5 tablets (25 mg total) by mouth 2 (two) times daily.   Multiple Vitamin (MULTIVITAMIN) tablet Take 1 tablet by mouth daily.   pantoprazole (PROTONIX) 40 MG tablet TAKE 1 TABLET (40 MG TOTAL) BY MOUTH 2 (TWO) TIMES DAILY BEFORE  MEALS   potassium chloride (KLOR-CON) 10 MEQ tablet TAKE 1 TABLET EVERY DAY   pravastatin (PRAVACHOL) 40 MG tablet TAKE 1 TABLET EVERY DAY   traMADol (ULTRAM) 50 MG tablet TAKE 1 TABLET BY MOUTH EVERY 6 HOURS AS NEEDED FOR  CHRONIC  BACK  PAIN   TRULICITY 1.5 MG/0.5ML SOPN INJECT 1.5MG  (1 PEN) SUBCUTANEOUSLY EVERY WEEK AS DIRECTED   No facility-administered encounter medications on file as of 05/16/2021.   Recent Relevant Labs: Lab Results  Component Value Date/Time   HGBA1C 5.9 (H) 03/20/2021 10:33 AM   HGBA1C 6.8 (H) 09/25/2020 11:38 AM   MICROALBUR 0.7 09/25/2020 11:38 AM   MICROALBUR 0.6 06/21/2019 11:59 AM    Kidney Function Lab Results  Component Value Date/Time   CREATININE 0.95 03/20/2021 10:33 AM   CREATININE 1.01 (H) 09/25/2020 11:38 AM   GFRNONAA 59 (L) 09/25/2020 11:38 AM   GFRAA 69 09/25/2020 11:38 AM    Current antihyperglycemic regimen:  Farxiga 10 mg Trulicity 1.5 mg per week Metformin 1000 mg twice daily  What recent interventions/DTPs have been made to improve glycemic  control:  STOPPED Glipizide XL 5 mg tablet daily  Have there been any recent hospitalizations or ED visits since last visit with CPP? Patient stated no.   Patient denies hypoglycemic symptoms, including None  Patient reports hyperglycemic symptoms, including none  How often are you checking your blood sugar? Patient stated 3-4 times daily  What are your blood sugars ranging?  171 is the highest reading she has recently had.   During the week, how often does your blood glucose drop  below 70?  Patient stated Never  Are you checking your feet daily/regularly?  Patient stated she checks her feet regularly.   Adherence Review: Is the patient currently on a STATIN medication? Pravastatin 40 mg  Is the patient currently on ACE/ARB medication? Lisinopril 5 mg  Does the patient have >5 day gap between last estimated fill dates? Per misc rpts, no.   Care Gaps:Patient is due for her AWV. Patient is over due for her Colonoscopy, Mammogram and PAP Smear.  Star Rating Drugs: Lisinopril 5 mg 03/08/21 90 DS, Metformin 1000 mg 04/06/21 90 DS, Farxiga 10 mg 04/02/21 90 DS, Pravastatin 40 mg 03/06/21 90 DS, Glipizide 5 mg 02/09/21 90 DS, Trulicity 1.5 mg/0.10ml 04/05/21 84 DS.   Follow-Up:Pharmacist Review   Hulen Luster, RMA Clinical Pharmacist Assistant [phone]  10 minutes spent in review, coordination, and documentation.  Reviewed by: Willa Frater, PharmD Clinical Pharmacist [phone]

## 2021-05-16 NOTE — Telephone Encounter (Signed)
Received call from patient.   Reports that Sx began on 05/15/2021 and include fever (T Max 102), dizziness, nausea, body aches, and HA.  Reports that she has been exposed to influenza. States that she does not feel like she has COVID,but has not tested.   Advised to continue symptom management with OTC medications: Tylenol/ Ibuprofen for fever/ body aches, Mucinex/ Delsym for cough/ chest congestion, Afrin/Sudafed/nasal saline for sinus pressure/ nasal congestion.  If chest pain, shortness of breath, fever >104 that is unresponsive to antipyretics noted, or if unable to tolerate fluids, advised to go to ER for evaluation.   Appointment scheduled for virtual visit.

## 2021-05-17 ENCOUNTER — Telehealth (INDEPENDENT_AMBULATORY_CARE_PROVIDER_SITE_OTHER): Payer: Medicare HMO | Admitting: Nurse Practitioner

## 2021-05-17 ENCOUNTER — Other Ambulatory Visit: Payer: Self-pay

## 2021-05-17 VITALS — Temp 98.5°F | Ht 66.0 in | Wt 271.0 lb

## 2021-05-17 DIAGNOSIS — R5381 Other malaise: Secondary | ICD-10-CM

## 2021-05-17 DIAGNOSIS — R319 Hematuria, unspecified: Secondary | ICD-10-CM | POA: Diagnosis not present

## 2021-05-17 DIAGNOSIS — R509 Fever, unspecified: Secondary | ICD-10-CM | POA: Diagnosis not present

## 2021-05-17 NOTE — Progress Notes (Signed)
Subjective:    Patient ID: Linda Briggs, female    DOB: [birth year], 65 y.o.   MRN: [id number]  HPI: Linda Briggs is a 65 y.o. female presenting virtually for feeling bad.  Chief Complaint  Patient presents with   chills, sweatin    Possible covid , flu, having night sweats, had fever, body ache, or possible uti   , notice blood in urine    FEVER Has had long COVID since 2020.   She gets bouts of days where her long COVID symptoms act up.  These had gotten better until about 1 week ago.  Her symptoms have since mostly improved with exception of fatigue. Onset: days Fever: yes; 101.7 Chills: yes Hot sweats: yes Body aches: yes; better now Cough: no Shortness of breath: no Wheezing: no Chest pain: no Chest tightness: no Chest congestion: no Nasal congestion: no Runny nose: no Post nasal drip: no Sneezing: no Sore throat: no Swollen glands: no Sinus pressure: no Headache: yes; better now Face pain: no Toothache: no Ear pain: no  Ear pressure: no  Eyes red/itching:no Eye drainage/crusting: no  Nausea: yes ; better now Vomiting: no Diarrhea: no  Change in appetite: yes ; decreased - wanted water only Loss of taste/smell: no  Rash: no Fatigue: yes  URINARY SYMPTOMS Reports she was wiping the other day and noticed some blood on the toilet paper.  She has had UTI in the past, never kidney stones. Dysuria: no Urinary frequency: yes Urgency: no; was but this is better Small volume voids: no Symptom severity: better Urinary incontinence: no Foul odor: no Hematuria: yes Abdominal pain: no Back pain: no Suprapubic pain/pressure: no Flank pain: no Fever:  yes Vomiting: no Previous urinary tract infection: yes Recurrent urinary tract infection: no Treatments attempted: increasing fluids  Allergies  Allergen Reactions   Codeine Anaphylaxis and Hives   Penicillins Anaphylaxis, Hives and Other (See Comments)    Has patient had a PCN reaction causing  immediate rash, facial/tongue/throat swelling, SOB or lightheadedness with hypotension: No Has patient had a PCN reaction causing severe rash involving mucus membranes or skin necrosis: No Has patient had a PCN reaction that required hospitalization: No Has patient had a PCN reaction occurring within the last 10 years: No If all of the above answers are "NO", then may proceed with Cephalosporin use.    Ciprofloxacin Other (See Comments)    Tongue Swelling   Food Nausea And Vomiting and Other (See Comments)    Marquis Buggy    Januvia [Sitagliptin] Other (See Comments)    Stomach cramps   Latex Hives    Outpatient Encounter Medications as of 05/17/2021  Medication Sig   ACCU-CHEK GUIDE test strip TEST BLOOD SUGAR THREE TIMES DAILY   Accu-Chek Softclix Lancets lancets TEST BLOOD SUGAR THREE TIMES DAILY   Alcohol Swabs (DROPSAFE ALCOHOL PREP) 70 % PADS USE THREE TIMES DAILY   aspirin EC 81 MG tablet Take 81 mg by mouth daily.   Blood Glucose Calibration (TRUE METRIX LEVEL 2) Normal SOLN Use as directed to monitor FSBS 3x daily. Dx: E11.65   Blood Glucose Monitoring Suppl (TRUE METRIX AIR GLUCOSE METER) DEVI 1 each by Does not apply route daily. Use as directed to monitor FSBS 3x daily. Dx: E11.65   FARXIGA 10 MG TABS tablet TAKE 1 TABLET EVERY DAY BEFORE BREAKFAST   furosemide (LASIX) 20 MG tablet TAKE 1 TABLET EVERY DAY   gabapentin (NEURONTIN) 300 MG capsule TAKE 1 CAPSULE THREE TIMES DAILY  lisinopril (ZESTRIL) 5 MG tablet TAKE 1 TABLET (5 MG TOTAL) BY MOUTH DAILY.   metFORMIN (GLUCOPHAGE) 1000 MG tablet TAKE 1 TABLET TWICE DAILY WITH A MEAL   metoprolol tartrate (LOPRESSOR) 50 MG tablet Take 0.5 tablets (25 mg total) by mouth 2 (two) times daily.   Multiple Vitamin (MULTIVITAMIN) tablet Take 1 tablet by mouth daily.   pantoprazole (PROTONIX) 40 MG tablet TAKE 1 TABLET (40 MG TOTAL) BY MOUTH 2 (TWO) TIMES DAILY BEFORE  MEALS   potassium chloride (KLOR-CON) 10 MEQ tablet TAKE 1 TABLET EVERY  DAY   pravastatin (PRAVACHOL) 40 MG tablet TAKE 1 TABLET EVERY DAY   traMADol (ULTRAM) 50 MG tablet TAKE 1 TABLET BY MOUTH EVERY 6 HOURS AS NEEDED FOR  CHRONIC  BACK  PAIN   TRULICITY 1.5 MG/0.5ML SOPN INJECT 1.5MG  (1 PEN) SUBCUTANEOUSLY EVERY WEEK AS DIRECTED   glipiZIDE (GLUCOTROL XL) 5 MG 24 hr tablet TAKE 1 TABLET  DAILY WITH BREAKFAST. (Patient not taking: Reported on 05/17/2021)   No facility-administered encounter medications on file as of 05/17/2021.    Patient Active Problem List   Diagnosis Date Noted   Lymphedema 03/13/2021   Ulcerated, foot, right, with fat layer exposed (HCC) 08/18/2020   Abnormal barium swallow    Nonproliferative diabetic retinopathy (HCC)    Globus pharyngeus 02/24/2018   Laryngopharyngeal reflux (LPR) 02/24/2018   Paroxysmal tachycardia (HCC) 01/07/2018   Morbid obesity (HCC) 01/13/2017   Pure hypercholesterolemia 01/11/2017   Localized edema 01/11/2017   Diabetic retinopathy (HCC) 05/29/2016   Abdominal pain 03/28/2014   Type 2 diabetes mellitus with complications (HCC) 03/28/2014   Weakness 07/26/2013   Hyperlipidemia    Essential hypertension    Shortness of breath 03/27/2012   Edema 03/27/2012   Near syncope 03/27/2012   Palpitations 03/27/2012    Past Medical History:  Diagnosis Date   Arthritis    feet & toes   Bulging disc    Diabetes mellitus    Gastroparesis    History of hepatitis B    Hyperlipidemia    Hypertension    Neuromuscular disorder (HCC)    in feet and fingers    Neuropathy    feet   Nonproliferative diabetic retinopathy (HCC)    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 Interim medical history since our last visit reviewed.  Review of Systems Per  HPI unless specifically indicated above     Objective:    Temp 98.5 F (36.9 C)   Ht 5\' 6"  (1.676 m)   Wt 271 lb (122.9 kg)   BMI 43.74 kg/m   Wt Readings from Last 3 Encounters:  05/17/21 271 lb (122.9 kg)  03/26/21 276 lb (125.2 kg)  03/13/21 280 lb 6.4 oz (127.2 kg)    Physical Exam Physical examination unable to be performed due to lack of equipment.     Assessment & Plan:  1. Fever, unspecified fever cause Acute, appears to be resolved now.  The patient never had upper respiratory symptoms, it is unclear what caused the fever although given the reports of hematuria, I would like to check a urine sample to rule out an acute urinary tract infection.  Since her symptoms are greatly improved, I do not think she needs to be evaluated in person, however if they worsen I have asked her to seek urgent care.  - Urinalysis, Routine w reflex microscopic; Future - Urine Culture; Future  2. Malaise  - Urinalysis, Routine w reflex microscopic; Future - Urine Culture; Future  3. Hematuria, unspecified type  - Urinalysis, Routine w reflex microscopic; Future - Urine Culture; Future    Follow up plan: No follow-ups on file.  This visit was completed via telephone due to the restrictions of the COVID-19 pandemic. All issues as above were discussed and addressed but no physical exam was performed. If it was felt that the patient should be evaluated in the office, they were directed there. The patient verbally consented to this visit. Patient was unable to complete an audio/visual visit due to Technical difficulties.  Patient got new smart phone and does not know how to work well. Location of the patient: home Location of the provider: work Those involved with this call:  Provider: Cathlean Marseilles, DNP, FNP-C CMA: Annabell Sabal, CMA Front Desk/Registration: Percival Spanish  Time spent on call:  12 minutes on the phone discussing health concerns. 25 minutes total spent in  review of patient's record and preparation of their chart. I verified patient identity using two factors (patient name and date of birth). Patient consents verbally to being seen via telemedicine visit today.

## 2021-05-18 ENCOUNTER — Other Ambulatory Visit: Payer: Medicare HMO

## 2021-05-18 ENCOUNTER — Other Ambulatory Visit: Payer: Self-pay

## 2021-05-18 DIAGNOSIS — R5381 Other malaise: Secondary | ICD-10-CM | POA: Diagnosis not present

## 2021-05-18 DIAGNOSIS — R509 Fever, unspecified: Secondary | ICD-10-CM

## 2021-05-18 DIAGNOSIS — R319 Hematuria, unspecified: Secondary | ICD-10-CM | POA: Diagnosis not present

## 2021-05-18 LAB — URINALYSIS, ROUTINE W REFLEX MICROSCOPIC
Bilirubin Urine: NEGATIVE
Hgb urine dipstick: NEGATIVE
Ketones, ur: NEGATIVE
Leukocytes,Ua: NEGATIVE
Nitrite: NEGATIVE
Protein, ur: NEGATIVE
Specific Gravity, Urine: 1.015 (ref 1.001–1.035)
pH: 5.5 (ref 5.0–8.0)

## 2021-05-19 LAB — URINE CULTURE
MICRO NUMBER:: 12384953
SPECIMEN QUALITY:: ADEQUATE

## 2021-05-25 ENCOUNTER — Other Ambulatory Visit: Payer: Self-pay | Admitting: Family Medicine

## 2021-06-06 ENCOUNTER — Other Ambulatory Visit: Payer: Self-pay | Admitting: Family Medicine

## 2021-06-15 ENCOUNTER — Other Ambulatory Visit: Payer: Self-pay | Admitting: *Deleted

## 2021-06-15 MED ORDER — ACCU-CHEK GUIDE VI STRP: ORAL_STRIP | 1 refills | Status: DC

## 2021-06-27 ENCOUNTER — Other Ambulatory Visit: Payer: Self-pay | Admitting: Physician Assistant

## 2021-06-27 NOTE — Telephone Encounter (Signed)
Patient needs an appointment for refills. Last seen 09/2020

## 2021-06-28 NOTE — Telephone Encounter (Signed)
LMOV  

## 2021-07-04 ENCOUNTER — Telehealth: Payer: Self-pay | Admitting: Family Medicine

## 2021-07-04 NOTE — Telephone Encounter (Signed)
Left message for patient to call back and schedule Medicare Annual Wellness Visit (AWV) in office.  ° °If not able to come in office, please offer to do virtually or by telephone.  Left office number and my jabber #[phone]. ° °Due for AWVI ° °Please schedule at anytime with Nurse Health Advisor. °  °

## 2021-08-09 ENCOUNTER — Ambulatory Visit (INDEPENDENT_AMBULATORY_CARE_PROVIDER_SITE_OTHER): Payer: Medicare HMO

## 2021-08-09 ENCOUNTER — Other Ambulatory Visit: Payer: Self-pay

## 2021-08-09 VITALS — Ht 66.0 in | Wt 271.0 lb

## 2021-08-09 DIAGNOSIS — Z0000 Encounter for general adult medical examination without abnormal findings: Secondary | ICD-10-CM

## 2021-08-09 NOTE — Patient Instructions (Signed)
Ms. Ratti , Thank you for taking time to come for your Medicare Wellness Visit. I appreciate your ongoing commitment to your health goals. Please review the following plan we discussed and let me know if I can assist you in the future.   Screening recommendations/referrals: Colonoscopy: Please complete Cologuard that you have at home. Mammogram: Schedule for after first of year.  Bone Density: Due at age 65.  Recommended yearly ophthalmology/optometry visit for glaucoma screening and checkup Recommended yearly dental visit for hygiene and checkup  Vaccinations: Influenza vaccine: Declined. Pneumococcal vaccine: Done 08/29/2014 and 08/18/2017 Tdap vaccine: Due. Repeat in 10 years  Shingles vaccine: Shingrix discussed. Please contact your pharmacy for coverage information.    Covid-19: Declined  Advanced directives: Please bring a copy of your health care power of attorney and living will to the office to be added to your chart at your convenience.   Conditions/risks identified: Aim for 30 minutes of exercise or brisk walking each day, drink 6-8 glasses of water and eat lots of fruits and vegetables.   Next appointment: Follow up in one year for your annual wellness visit. 2023.  Preventive Care 40-64 Years, Female Preventive care refers to lifestyle choices and visits with your health care provider that can promote health and wellness. What does preventive care include? A yearly physical exam. This is also called an annual well check. Dental exams once or twice a year. Routine eye exams. Ask your health care provider how often you should have your eyes checked. Personal lifestyle choices, including: Daily care of your teeth and gums. Regular physical activity. Eating a healthy diet. Avoiding tobacco and drug use. Limiting alcohol use. Practicing safe sex. Taking low-dose aspirin daily starting at age 70. Taking vitamin and mineral supplements as recommended by your health  care provider. What happens during an annual well check? The services and screenings done by your health care provider during your annual well check will depend on your age, overall health, lifestyle risk factors, and family history of disease. Counseling  Your health care provider may ask you questions about your: Alcohol use. Tobacco use. Drug use. Emotional well-being. Home and relationship well-being. Sexual activity. Eating habits. Work and work Statistician. Method of birth control. Menstrual cycle. Pregnancy history. Screening  You may have the following tests or measurements: Height, weight, and BMI. Blood pressure. Lipid and cholesterol levels. These may be checked every 5 years, or more frequently if you are over 7 years old. Skin check. Lung cancer screening. You may have this screening every year starting at age 45 if you have a 30-pack-year history of smoking and currently smoke or have quit within the past 15 years. Fecal occult blood test (FOBT) of the stool. You may have this test every year starting at age 61. Flexible sigmoidoscopy or colonoscopy. You may have a sigmoidoscopy every 5 years or a colonoscopy every 10 years starting at age 9. Hepatitis C blood test. Hepatitis B blood test. Sexually transmitted disease (STD) testing. Diabetes screening. This is done by checking your blood sugar (glucose) after you have not eaten for a while (fasting). You may have this done every 1-3 years. Mammogram. This may be done every 1-2 years. Talk to your health care provider about when you should start having regular mammograms. This may depend on whether you have a family history of breast cancer. BRCA-related cancer screening. This may be done if you have a family history of breast, ovarian, tubal, or peritoneal cancers. Pelvic exam and Pap test.  This may be done every 3 years starting at age 98. Starting at age 65, this may be done every 5 years if you have a Pap test in  combination with an HPV test. Bone density scan. This is done to screen for osteoporosis. You may have this scan if you are at high risk for osteoporosis. Discuss your test results, treatment options, and if necessary, the need for more tests with your health care provider. Vaccines  Your health care provider may recommend certain vaccines, such as: Influenza vaccine. This is recommended every year. Tetanus, diphtheria, and acellular pertussis (Tdap, Td) vaccine. You may need a Td booster every 10 years. Zoster vaccine. You may need this after age 74. Pneumococcal 13-valent conjugate (PCV13) vaccine. You may need this if you have certain conditions and were not previously vaccinated. Pneumococcal polysaccharide (PPSV23) vaccine. You may need one or two doses if you smoke cigarettes or if you have certain conditions. Talk to your health care provider about which screenings and vaccines you need and how often you need them. This information is not intended to replace advice given to you by your health care provider. Make sure you discuss any questions you have with your health care provider. Document Released: 09/15/2015 Document Revised: 05/08/2016 Document Reviewed: 06/20/2015 Elsevier Interactive Patient Education  2017 [employer] Prevention in the Home Falls can cause injuries. They can happen to people of all ages. There are many things you can do to make your home safe and to help prevent falls. What can I do on the outside of my home? Regularly fix the edges of walkways and driveways and fix any cracks. Remove anything that might make you trip as you walk through a door, such as a raised step or threshold. Trim any bushes or trees on the path to your home. Use bright outdoor lighting. Clear any walking paths of anything that might make someone trip, such as rocks or tools. Regularly check to see if handrails are loose or broken. Make sure that both sides of any steps have  handrails. Any raised decks and porches should have guardrails on the edges. Have any leaves, snow, or ice cleared regularly. Use sand or salt on walking paths during winter. Clean up any spills in your garage right away. This includes oil or grease spills. What can I do in the bathroom? Use night lights. Install grab bars by the toilet and in the tub and shower. Do not use towel bars as grab bars. Use non-skid mats or decals in the tub or shower. If you need to sit down in the shower, use a plastic, non-slip stool. Keep the floor dry. Clean up any water that spills on the floor as soon as it happens. Remove soap buildup in the tub or shower regularly. Attach bath mats securely with double-sided non-slip rug tape. Do not have throw rugs and other things on the floor that can make you trip. What can I do in the bedroom? Use night lights. Make sure that you have a light by your bed that is easy to reach. Do not use any sheets or blankets that are too big for your bed. They should not hang down onto the floor. Have a firm chair that has side arms. You can use this for support while you get dressed. Do not have throw rugs and other things on the floor that can make you trip. What can I do in the kitchen? Clean up any spills right  away. Avoid walking on wet floors. Keep items that you use a lot in easy-to-reach places. If you need to reach something above you, use a strong step stool that has a grab bar. Keep electrical cords out of the way. Do not use floor polish or wax that makes floors slippery. If you must use wax, use non-skid floor wax. Do not have throw rugs and other things on the floor that can make you trip. What can I do with my stairs? Do not leave any items on the stairs. Make sure that there are handrails on both sides of the stairs and use them. Fix handrails that are broken or loose. Make sure that handrails are as long as the stairways. Check any carpeting to make sure  that it is firmly attached to the stairs. Fix any carpet that is loose or worn. Avoid having throw rugs at the top or bottom of the stairs. If you do have throw rugs, attach them to the floor with carpet tape. Make sure that you have a light switch at the top of the stairs and the bottom of the stairs. If you do not have them, ask someone to add them for you. What else can I do to help prevent falls? Wear shoes that: Do not have high heels. Have rubber bottoms. Are comfortable and fit you well. Are closed at the toe. Do not wear sandals. If you use a stepladder: Make sure that it is fully opened. Do not climb a closed stepladder. Make sure that both sides of the stepladder are locked into place. Ask someone to hold it for you, if possible. Clearly mark and make sure that you can see: Any grab bars or handrails. First and last steps. Where the edge of each step is. Use tools that help you move around (mobility aids) if they are needed. These include: Canes. Walkers. Scooters. Crutches. Turn on the lights when you go into a dark area. Replace any light bulbs as soon as they burn out. Set up your furniture so you have a clear path. Avoid moving your furniture around. If any of your floors are uneven, fix them. If there are any pets around you, be aware of where they are. Review your medicines with your doctor. Some medicines can make you feel dizzy. This can increase your chance of falling. Ask your doctor what other things that you can do to help prevent falls. This information is not intended to replace advice given to you by your health care provider. Make sure you discuss any questions you have with your health care provider. Document Released: 06/15/2009 Document Revised: 01/25/2016 Document Reviewed: 09/23/2014 Elsevier Interactive Patient Education  2017 [employer].

## 2021-08-09 NOTE — Progress Notes (Signed)
Subjective:   Linda Briggs is a 65 y.o. female who presents for an Initial Medicare Annual Wellness Visit. Virtual Visit via Telephone Note  I connected with  Linda Briggs on 08/09/21 at  2:00 PM EST by telephone and verified that I am speaking with the correct person using two identifiers.  Location: Patient: Home Provider: [facility] Persons participating in the virtual visit: patient/Nurse Health Advisor   I discussed the limitations, risks, security and privacy concerns of performing an evaluation and management service by telephone and the availability of in person appointments. The patient expressed understanding and agreed to proceed.  Interactive audio and video telecommunications were attempted between this nurse and patient, however failed, due to patient having technical difficulties OR patient did not have access to video capability.  We continued and completed visit with audio only.  Some vital signs may be absent or patient reported.   Darral Dash, LPN  Review of Systems     Cardiac Risk Factors include: diabetes mellitus;hypertension;dyslipidemia;obesity (BMI >30kg/m2);sedentary lifestyle     Objective:    Today's Vitals   08/09/21 1355  Weight: 271 lb (122.9 kg)  Height: 5\' 6"  (1.676 m)   Body mass index is 43.74 kg/m.  Advanced Directives 08/09/2021 08/05/2020 04/28/2018 02/09/2015 10/19/2014  Does Patient Have a Medical Advance Directive? Yes Yes Yes Yes Yes  Type of 10/21/2014 of [place];Living will Healthcare Power of [place];Living will Healthcare Power of [place];Living will;Advance instruction for mental health treatment  Does patient want to make changes to medical advance directive? - - - No - Patient declined No - Patient declined  Copy of Healthcare Power of Attorney in Chart? No - copy requested - Yes No - copy requested No - copy requested  Would patient like  information on creating a medical advance directive? No - Patient declined - - - -    Current Medications (verified) Outpatient Encounter Medications as of 08/09/2021  Medication Sig   Accu-Chek Softclix Lancets lancets TEST BLOOD SUGAR THREE TIMES DAILY   Alcohol Swabs (DROPSAFE ALCOHOL PREP) 70 % PADS USE THREE TIMES DAILY   aspirin EC 81 MG tablet Take 81 mg by mouth daily.   Blood Glucose Calibration (TRUE METRIX LEVEL 2) Normal SOLN Use as directed to monitor FSBS 3x daily. Dx: E11.65   Blood Glucose Monitoring Suppl (TRUE METRIX AIR GLUCOSE METER) DEVI 1 each by Does not apply route daily. Use as directed to monitor FSBS 3x daily. Dx: E11.65   FARXIGA 10 MG TABS tablet TAKE 1 TABLET EVERY DAY BEFORE BREAKFAST   furosemide (LASIX) 20 MG tablet TAKE 1 TABLET EVERY DAY   gabapentin (NEURONTIN) 300 MG capsule TAKE 1 CAPSULE THREE TIMES DAILY   glucose blood (ACCU-CHEK GUIDE) test strip TEST BLOOD SUGAR THREE TIMES DAILY. DX: E11.9.   lisinopril (ZESTRIL) 5 MG tablet TAKE 1 TABLET (5 MG TOTAL) BY MOUTH DAILY.   metFORMIN (GLUCOPHAGE) 1000 MG tablet TAKE 1 TABLET TWICE DAILY WITH A MEAL   metoprolol tartrate (LOPRESSOR) 50 MG tablet Take 0.5 tablets (25 mg total) by mouth 2 (two) times daily.   Multiple Vitamin (MULTIVITAMIN) tablet Take 1 tablet by mouth daily.   ondansetron (ZOFRAN) 4 MG tablet TAKE 1 TABLET (4 MG TOTAL) BY MOUTH EVERY 8 (EIGHT) HOURS AS NEEDED FOR NAUSEA OR VOMITING.   pantoprazole (PROTONIX) 40 MG tablet TAKE 1 TABLET (40 MG TOTAL) BY MOUTH 2 (TWO) TIMES DAILY BEFORE  MEALS   potassium chloride (KLOR-CON) 10 MEQ tablet TAKE 1 TABLET EVERY DAY   pravastatin (PRAVACHOL) 40 MG tablet TAKE 1 TABLET EVERY DAY   traMADol (ULTRAM) 50 MG tablet TAKE 1 TABLET BY MOUTH EVERY 6 HOURS AS NEEDED FOR  CHRONIC  BACK  PAIN   TRULICITY 1.5 MG/0.5ML SOPN INJECT 1.5MG  (1 PEN) SUBCUTANEOUSLY EVERY WEEK AS DIRECTED   glipiZIDE (GLUCOTROL XL) 5 MG 24 hr tablet TAKE 1 TABLET  DAILY WITH  BREAKFAST. (Patient not taking: Reported on 08/09/2021)   No facility-administered encounter medications on file as of 08/09/2021.    Allergies (verified) Codeine, Penicillins, Ciprofloxacin, Food, Januvia [sitagliptin], and Latex   History: Past Medical History:  Diagnosis Date   Arthritis    feet & toes   Bulging disc    Diabetes mellitus    Gastroparesis    History of hepatitis B    Hyperlipidemia    Hypertension    Neuromuscular disorder (HCC)    in feet and fingers    Neuropathy    feet   Nonproliferative diabetic retinopathy (HCC)    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    Peripheral vascular disease (HCC)    Shortness of breath dyspnea    on lopressor    Sleep apnea    pt had study done but it was incomplete due to blood sugar issues    Past Surgical History:  Procedure Laterality Date   BIOPSY  04/28/2018   Procedure: BIOPSY;  Surgeon: Meryl Dare, MD;  Location: [facility];  Service: Endoscopy;;   BIOPSY THYROID     january 2016   ESOPHAGOGASTRODUODENOSCOPY (EGD) WITH PROPOFOL N/A 04/28/2018   Procedure: ESOPHAGOGASTRODUODENOSCOPY (EGD) WITH PROPOFOL;  Surgeon: Meryl Dare, MD;  Location: [facility];  Service: Endoscopy;  Laterality: N/A;   HYSTEROSCOPY WITH D & C N/A 02/15/2015   Procedure: DILATATION AND CURETTAGE /HYSTEROSCOPY;  Surgeon: Mitchel Honour, DO;  Location: [facility];  Service: Gynecology;  Laterality: N/A;   TONSILLECTOMY     Family History  Problem Relation Age of Onset   Hyperlipidemia Mother    Heart disease Father    Hyperlipidemia Father    Hypertension Father    Heart disease Brother    Kidney disease Sister    Kidney cancer Sister    Ovarian cancer Maternal Aunt    Kidney disease Maternal Aunt        x 2   Kidney disease Maternal Uncle        x 2   Stroke Brother    Heart disease Brother    Colon cancer Neg Hx    Stomach cancer Neg Hx    Social History   Socioeconomic History   Marital status: Single    Spouse  name: Not on file   Number of children: 0   Years of education: Not on file   Highest education level: Not on file  Occupational History   Occupation: retired  Tobacco Use   Smoking status: Never   Smokeless tobacco: Never  Vaping Use   Vaping Use: Never used  Substance and Sexual Activity   Alcohol use: No    Comment: social drinker.   Drug use: No   Sexual activity: Not on file  Other Topics Concern   Not on file  Social History Narrative   Lives alone. 1 brother living.    Social Determinants of Health   Financial Resource Strain: Low Risk    Difficulty of Paying Living Expenses:  Not very hard  Food Insecurity: No Food Insecurity   Worried About Programme researcher, broadcasting/film/video in the Last Year: Never true   Ran Out of Food in the Last Year: Never true  Transportation Needs: No Transportation Needs   Lack of Transportation (Medical): No   Lack of Transportation (Non-Medical): No  Physical Activity: Inactive   Days of Exercise per Week: 0 days   Minutes of Exercise per Session: 0 min  Stress: No Stress Concern Present   Feeling of Stress : Not at all  Social Connections: Moderately Integrated   Frequency of Communication with Friends and Family: More than three times a week   Frequency of Social Gatherings with Friends and Family: More than three times a week   Attends Religious Services: More than 4 times per year   Active Member of [employer] or Organizations: Yes   Attends Engineer, structural: More than 4 times per year   Marital Status: Never married    Tobacco Counseling Counseling given: Not Answered   Clinical Intake:  Pre-visit preparation completed: Yes  Pain : No/denies pain     BMI - recorded: 43.74 Nutritional Status: BMI > 30  Obese Nutritional Risks: None Diabetes: Yes  How often do you need to have someone help you when you read instructions, pamphlets, or other written materials from your doctor or pharmacy?: 1 - Never  Diabetic?Nutrition Risk  Assessment:  Has the patient had any N/V/D within the last 2 months?  No  Does the patient have any non-healing wounds?  No  Has the patient had any unintentional weight loss or weight gain?  No   Diabetes:  Is the patient diabetic?  Yes  If diabetic, was a CBG obtained today?  No  Did the patient bring in their glucometer from home?  No  Phone visit.  How often do you monitor your CBG's? Daily.   Financial Strains and Diabetes Management:  Are you having any financial strains with the device, your supplies or your medication? No .  Does the patient want to be seen by Chronic Care Management for management of their diabetes?  No  Would the patient like to be referred to a Nutritionist or for Diabetic Management?  No   Diabetic Exams:  Diabetic Eye Exam: Completed 12/13/2020.  Pt has been advised about the importance in completing this exam. Diabetic Foot Exam: Completed 03/26/2021. Pt has been advised about the importance in completing this exam.   Interpreter Needed?: No  Information entered by :: MJ Jaydn Fincher,LPN   Activities of Daily Living In your present state of health, do you have any difficulty performing the following activities: 08/09/2021  Hearing? N  Vision? N  Difficulty concentrating or making decisions? N  Walking or climbing stairs? N  Dressing or bathing? N  Doing errands, shopping? N  Preparing Food and eating ? N  Using the Toilet? N  In the past six months, have you accidently leaked urine? N  Managing your Medications? N  Managing your Finances? N  Housekeeping or managing your Housekeeping? N  Some recent data might be hidden    Patient Care Team: Donita Brooks, MD as PCP - General (Family Medicine) Antonieta Iba, MD as PCP - Cardiology (Cardiology) Erroll Luna, [facility] as Pharmacist (Pharmacist)  Indicate any recent Medical Services you may have received from other than [facility] providers in the past year (date may be approximate).      Assessment:   This is a  routine wellness examination for Linda Briggs.  Hearing/Vision screen Hearing Screening - Comments:: Some hearing issues. Vision Screening - Comments:: Readers. Dr. Elmer Picker. 2022.  Dietary issues and exercise activities discussed: Current Exercise Habits: The patient does not participate in regular exercise at present, Exercise limited by: cardiac condition(s);orthopedic condition(s)   Goals Addressed             This Visit's Progress    Exercise 3x per week (30 min per time)       Encouraged pt to return to [facility] to exercise.       Depression Screen PHQ 2/9 Scores 08/09/2021 05/17/2021 03/26/2021 11/17/2017 08/18/2017 11/05/2016  PHQ - 2 Score 0 0 0 1 1 0  PHQ- 9 Score - - - - 4 -    Fall Risk Fall Risk  08/09/2021 05/17/2021 03/26/2021  Falls in the past year? 0 0 1  Number falls in past yr: 0 0 1  Injury with Fall? 0 0 0  Risk for fall due to : Impaired balance/gait;Impaired mobility - History of fall(s);Impaired mobility  Follow up Falls prevention discussed Falls evaluation completed Falls evaluation completed    FALL RISK PREVENTION PERTAINING TO THE HOME:  Any stairs in or around the home? No  If so, are there any without handrails? No  Home free of loose throw rugs in walkways, pet beds, electrical cords, etc? Yes  Adequate lighting in your home to reduce risk of falls? Yes   ASSISTIVE DEVICES UTILIZED TO PREVENT FALLS:  Life alert? No  Use of a cane, walker or w/c? Yes  Grab bars in the bathroom? No  Shower chair or bench in shower? Yes  Elevated toilet seat or a handicapped toilet? No   TIMED UP AND GO:  Was the test performed? No .    Cognitive Function:     6CIT Screen 08/09/2021  What Year? 0 points  What month? 0 points  What time? 0 points  Count back from 20 0 points  Months in reverse 0 points  Repeat phrase 0 points  Total Score 0    Immunizations Immunization History  Administered Date(s) Administered    Influenza,inj,Quad PF,6+ Mos 05/30/2014, 05/29/2015, 07/02/2017, 05/22/2018   Pneumococcal Conjugate-13 08/18/2017   Pneumococcal Polysaccharide-23 08/29/2014    TDAP status: Due, Education has been provided regarding the importance of this vaccine. Advised may receive this vaccine at local pharmacy or Health Dept. Aware to provide a copy of the vaccination record if obtained from local pharmacy or Health Dept. Verbalized acceptance and understanding.  Flu Vaccine status: Due, Education has been provided regarding the importance of this vaccine. Advised may receive this vaccine at local pharmacy or Health Dept. Aware to provide a copy of the vaccination record if obtained from local pharmacy or Health Dept. Verbalized acceptance and understanding.  Pneumococcal vaccine status: Completed during today's visit.  Covid-19 vaccine status: Information provided on how to obtain vaccines.   Qualifies for Shingles Vaccine? Yes   Zostavax completed No   Shingrix Completed?: No.    Education has been provided regarding the importance of this vaccine. Patient has been advised to call insurance company to determine out of pocket expense if they have not yet received this vaccine. Advised may also receive vaccine at local pharmacy or Health Dept. Verbalized acceptance and understanding.  Screening Tests Health Maintenance  Topic Date Due   TETANUS/TDAP  Never done   Zoster Vaccines- Shingrix (1 of 2) Never done   COLONOSCOPY (Pts 45-36yrs Insurance coverage  will need to be confirmed)  Never done   MAMMOGRAM  02/18/2018   PAP SMEAR-Modifier  02/19/2018   Pneumococcal Vaccine 4-26 Years old (3 - PPSV23 if available, else PCV20) 08/30/2019   INFLUENZA VACCINE  04/02/2021   HEMOGLOBIN A1C  09/20/2021   OPHTHALMOLOGY EXAM  12/13/2021   FOOT EXAM  03/26/2022   HPV VACCINES  Aged Out   COVID-19 Vaccine  Discontinued   Hepatitis C Screening  Discontinued   HIV Screening  Discontinued    Health  Maintenance  Health Maintenance Due  Topic Date Due   TETANUS/TDAP  Never done   Zoster Vaccines- Shingrix (1 of 2) Never done   COLONOSCOPY (Pts 45-5yrs Insurance coverage will need to be confirmed)  Never done   MAMMOGRAM  02/18/2018   PAP SMEAR-Modifier  02/19/2018   Pneumococcal Vaccine 67-84 Years old (3 - PPSV23 if available, else PCV20) 08/30/2019   INFLUENZA VACCINE  04/02/2021    Colorectal cancer screening: Referral to GI placed PT HAS COLOGUARD. Pt aware the office will call re: appt.  Mammogram status: Ordered 08/09/2021. Pt provided with contact info and advised to call to schedule appt.   Bone Density status: Ordered PT WOULD LIKE TO WAIT UNTIL AFTER AGE 39. Pt provided with contact info and advised to call to schedule appt.  Lung Cancer Screening: (Low Dose CT Chest recommended if Age 80-80 years, 30 pack-year currently smoking OR have quit w/in 15years.) does not qualify.  NON SMOKER.  Additional Screening:  Hepatitis C Screening: does qualify; Completed DECLINED.  Vision Screening: Recommended annual ophthalmology exams for early detection of glaucoma and other disorders of the eye. Is the patient up to date with their annual eye exam?  Yes  Who is the provider or what is the name of the office in which the patient attends annual eye exams? Dr. Elmer Picker If pt is not established with a provider, would they like to be referred to a provider to establish care? No .   Dental Screening: Recommended annual dental exams for proper oral hygiene  Community Resource Referral / Chronic Care Management: CRR required this visit?  No   CCM required this visit?  No      Plan:     I have personally reviewed and noted the following in the patient's chart:   Medical and social history Use of alcohol, tobacco or illicit drugs  Current medications and supplements including opioid prescriptions. Patient is not currently taking opioid prescriptions. Functional ability and  status Nutritional status Physical activity Advanced directives List of other physicians Hospitalizations, surgeries, and ER visits in previous 12 months Vitals Screenings to include cognitive, depression, and falls Referrals and appointments  In addition, I have reviewed and discussed with patient certain preventive protocols, quality metrics, and best practice recommendations. A written personalized care plan for preventive services as well as general preventive health recommendations were provided to patient.     Darral Dash, LPN   [id number]   Nurse Notes: PHONE VISIT. PT AT HOME. NURSE AT [facility].  Pt declined vaccines. Pt declines scheduling mammogram or Dexa a this time. Pt has Cologuard at home. Encouraged pt to fil out and send back.

## 2021-08-19 NOTE — Progress Notes (Signed)
Cardiology Office Note  Date:  08/20/2021   ID:  Gracin, Soohoo 10/27/1955, MRN [id number]  PCP:  Donita Brooks, MD   Chief Complaint  Patient presents with   12 month follow up     "Doing well." Medications reviewed by the patient verbally.     HPI:  Ms. Cude is a very pleasant 65 year old woman with  diabetes, on insulin.  hemoglobin A1c  5.9  Episodes of sinus tachycardia morbid obesity,  hyperlipidemia,  hypertension,  chronic lower extremity swelling,   dizziness.   Neuropathy in her feet Mom with dementia, lives with her Covid 12/21 Lymphedema, has pumps She presents for routine followup of her hypertension,  leg swelling, sinus tachycardia  In follow-up today reports feeling relatively well Troubled by low blood pressure but no significant orthostasis symptoms Just feels tired at times at home  Gait instability, uses a cane, no falls  Continues to take metoprolol tartrate 25 twice daily  Lab work reviewed A1C 5.9 Total chol 161 LDL 100  prior carotid ultrasound showing minimal disease.   Not using lymphedema compression pumps Followed by Dr. Wyn Quaker for lymphedema  EKG personally reviewed by myself on todays visit Shows normal sinus rhythm rate 71 bpm left axis deviation no significant ST-T wave changes  Other past medical hx reviewed  Workup for covid 2021 08/31/20 Pharmacological myocardial perfusion imaging study with no significant  ischemia Normal wall motion, EF estimated at 78% No EKG changes concerning for ischemia at peak stress or in recovery. CT attenuation correction images with mild aortic atherosclerosis, minimal coronary calcification Low risk scan  Echo 08/16/20 1. Left ventricular ejection fraction, by estimation, is 55 to 60%. The  left ventricle has normal function. The left ventricle has no regional  wall motion abnormalities. Left ventricular diastolic parameters are  consistent with Grade II diastolic  dysfunction  (pseudonormalization).   2. Right ventricular systolic function is normal. The right ventricular  size is normal. There is normal pulmonary artery systolic pressure.   3. The pericardial effusion is posterior and lateral to the left  ventricle.   4. The mitral valve is grossly normal. No evidence of mitral valve  regurgitation.   5. The aortic valve was not well visualized. Aortic valve regurgitation  is not visualized.   6. The inferior vena cava is normal in size with greater than 50%  respiratory variability, suggesting right atrial pressure of 3 mmHg.    Holter monitor in the past that showed elevated baseline heart rate with frequent periods of sinus tachycardia. Improvement in dizzy episodes and fluttering with metoprolol.   PMH:   has a past medical history of Arthritis, Bulging disc, Diabetes mellitus, Gastroparesis, History of hepatitis B, Hyperlipidemia, Hypertension, Neuromuscular disorder (HCC), Neuropathy, Nonproliferative diabetic retinopathy (HCC), Periodic heart flutter, Peripheral vascular disease (HCC), Shortness of breath dyspnea, and Sleep apnea.  PSH:    Past Surgical History:  Procedure Laterality Date   BIOPSY  04/28/2018   Procedure: BIOPSY;  Surgeon: Meryl Dare, MD;  Location: [facility];  Service: Endoscopy;;   BIOPSY THYROID     january 2016   ESOPHAGOGASTRODUODENOSCOPY (EGD) WITH PROPOFOL N/A 04/28/2018   Procedure: ESOPHAGOGASTRODUODENOSCOPY (EGD) WITH PROPOFOL;  Surgeon: Meryl Dare, MD;  Location: [facility];  Service: Endoscopy;  Laterality: N/A;   HYSTEROSCOPY WITH D & C N/A 02/15/2015   Procedure: DILATATION AND CURETTAGE /HYSTEROSCOPY;  Surgeon: Mitchel Honour, DO;  Location: [facility];  Service: Gynecology;  Laterality: N/A;   TONSILLECTOMY  Outpatient Encounter Medications as of 08/20/2021  Medication Sig   Accu-Chek Softclix Lancets lancets TEST BLOOD SUGAR THREE TIMES DAILY   Alcohol Swabs (DROPSAFE ALCOHOL PREP) 70 % PADS USE  THREE TIMES DAILY   aspirin EC 81 MG tablet Take 81 mg by mouth daily.   Blood Glucose Calibration (TRUE METRIX LEVEL 2) Normal SOLN Use as directed to monitor FSBS 3x daily. Dx: E11.65   Blood Glucose Monitoring Suppl (TRUE METRIX AIR GLUCOSE METER) DEVI 1 each by Does not apply route daily. Use as directed to monitor FSBS 3x daily. Dx: E11.65   FARXIGA 10 MG TABS tablet TAKE 1 TABLET EVERY DAY BEFORE BREAKFAST   furosemide (LASIX) 20 MG tablet TAKE 1 TABLET EVERY DAY   gabapentin (NEURONTIN) 300 MG capsule TAKE 1 CAPSULE THREE TIMES DAILY   glucose blood (ACCU-CHEK GUIDE) test strip TEST BLOOD SUGAR THREE TIMES DAILY. DX: E11.9.   lisinopril (ZESTRIL) 5 MG tablet TAKE 1 TABLET (5 MG TOTAL) BY MOUTH DAILY.   metFORMIN (GLUCOPHAGE) 1000 MG tablet TAKE 1 TABLET TWICE DAILY WITH A MEAL   metoprolol tartrate (LOPRESSOR) 50 MG tablet Take 0.5 tablets (25 mg total) by mouth 2 (two) times daily.   Multiple Vitamin (MULTIVITAMIN) tablet Take 1 tablet by mouth daily.   ondansetron (ZOFRAN) 4 MG tablet TAKE 1 TABLET (4 MG TOTAL) BY MOUTH EVERY 8 (EIGHT) HOURS AS NEEDED FOR NAUSEA OR VOMITING.   pantoprazole (PROTONIX) 40 MG tablet TAKE 1 TABLET (40 MG TOTAL) BY MOUTH 2 (TWO) TIMES DAILY BEFORE  MEALS   potassium chloride (KLOR-CON) 10 MEQ tablet TAKE 1 TABLET EVERY DAY   pravastatin (PRAVACHOL) 40 MG tablet TAKE 1 TABLET EVERY DAY   traMADol (ULTRAM) 50 MG tablet TAKE 1 TABLET BY MOUTH EVERY 6 HOURS AS NEEDED FOR  CHRONIC  BACK  PAIN   TRULICITY 1.5 MG/0.5ML SOPN INJECT 1.5MG  (1 PEN) SUBCUTANEOUSLY EVERY WEEK AS DIRECTED   glipiZIDE (GLUCOTROL XL) 5 MG 24 hr tablet TAKE 1 TABLET  DAILY WITH BREAKFAST. (Patient not taking: Reported on 08/09/2021)   No facility-administered encounter medications on file as of 08/20/2021.   Allergies:   Codeine, Penicillins, Ciprofloxacin, Food, Januvia [sitagliptin], and Latex   Social History:  The patient  reports that she has never smoked. She has never used smokeless  tobacco. She reports that she does not drink alcohol and does not use drugs.   Family History:   family history includes Heart disease in her brother, brother, and father; Hyperlipidemia in her father and mother; Hypertension in her father; Kidney cancer in her sister; Kidney disease in her maternal aunt, maternal uncle, and sister; Ovarian cancer in her maternal aunt; Stroke in her brother.    Review of Systems: Review of Systems  Constitutional: Negative.   HENT: Negative.    Respiratory: Negative.    Cardiovascular:  Positive for leg swelling.  Gastrointestinal: Negative.   Musculoskeletal: Negative.        Leg weakness, balance instability  Neurological: Negative.   Psychiatric/Behavioral: Negative.    All other systems reviewed and are negative.  PHYSICAL EXAM: VS:  BP 110/60 (BP Location: Left Arm, Patient Position: Sitting, Cuff Size: Large)    Pulse 71    Ht 5\' 6"  (1.676 m)    Wt 274 lb 2 oz (124.3 kg)    SpO2 98%    BMI 44.24 kg/m  , BMI Body mass index is 44.24 kg/m. Constitutional:  oriented to person, place, and time. No distress.  Obese HENT:  Head: Grossly normal Eyes:  no discharge. No scleral icterus.  Neck: No JVD, no carotid bruits  Cardiovascular: Regular rate and rhythm, no murmurs appreciated Pulmonary/Chest: Clear to auscultation bilaterally, no wheezes or rails Abdominal: Soft.  no distension.  no tenderness.  Musculoskeletal: Normal range of motion Neurological:  normal muscle tone. Coordination normal. No atrophy Skin: Skin warm and dry Psychiatric: normal affect, pleasant   Recent Labs: 03/20/2021: ALT 13; BUN 12; Creat 0.95; Hemoglobin 13.5; Platelets 113; Potassium 3.9; Sodium 140; TSH 2.41    Lipid Panel Lab Results  Component Value Date   CHOL 161 03/20/2021   HDL 29 (L) 03/20/2021   LDLCALC 100 (H) 03/20/2021   TRIG 197 (H) 03/20/2021      Wt Readings from Last 3 Encounters:  08/20/21 274 lb 2 oz (124.3 kg)  08/09/21 271 lb (122.9 kg)   05/17/21 271 lb (122.9 kg)      ASSESSMENT AND PLAN:  Shortness of breath - Plan: EKG 12-Lead Obesity, deconditioning Weight trending down  Type 2 diabetes mellitus with complication, with long-term current use of insulin (HCC) - Plan: EKG 12-Lead We have encouraged continued exercise, careful diet management in an effort to lose weight. Recommended avoiding sweet tea, sodas Recommend she move the lisinopril 5 mg to the evening given fatigue in the day  Paroxysmal tachycardia Controlled on metoprolol tartrate 25 twice daily She is concerned about low blood pressure causing fatigue Recommend she could try metoprolol 12.5 mg twice daily with extra metoprolol as needed  Essential hypertension - Plan: EKG 12-Lead Stay on metoprolol, rate and rhythm well controlled Blood pressure numbers well controlled  Pure hypercholesterolemia - Plan: EKG 12-Lead Stay on pravastain  Localized edema - Plan: EKG 12-Lead Chronic venous stasis/lymphedema Needs ace wraps, compression pumps Followed by Dr. Wyn Quaker  Morbid obesity [facility]) Unable to exercise Stressed importance of low carbohydrates   Total encounter time more than 25 minutes  Greater than 50% was spent in counseling and coordination of care with the patient    Orders Placed This Encounter  Procedures   EKG 12-Lead     Signed, Dossie Arbour, M.D., Ph.D. 08/20/2021  [facility] [place], [place] [phone]

## 2021-08-20 ENCOUNTER — Other Ambulatory Visit: Payer: Self-pay

## 2021-08-20 ENCOUNTER — Encounter: Payer: Self-pay | Admitting: Cardiovascular Disease

## 2021-08-20 ENCOUNTER — Ambulatory Visit: Payer: Medicare HMO | Admitting: Cardiovascular Disease

## 2021-08-20 VITALS — BP 110/60 | HR 71 | Ht 66.0 in | Wt 274.1 lb

## 2021-08-20 DIAGNOSIS — I10 Essential (primary) hypertension: Secondary | ICD-10-CM

## 2021-08-20 DIAGNOSIS — I890 Lymphedema, not elsewhere classified: Secondary | ICD-10-CM

## 2021-08-20 DIAGNOSIS — R0609 Other forms of dyspnea: Secondary | ICD-10-CM

## 2021-08-20 DIAGNOSIS — R000 Tachycardia, unspecified: Secondary | ICD-10-CM | POA: Diagnosis not present

## 2021-08-20 NOTE — Patient Instructions (Addendum)
Medication Instructions:  No changes  If you need a refill on your cardiac medications before your next appointment, please call your pharmacy.   Lab work: No new labs needed  Testing/Procedures: No new testing needed  Follow-Up: At [facility], you and your health needs are our priority.  As part of our continuing mission to provide you with exceptional heart care, we have created designated Provider Care Teams.  These Care Teams include your primary Cardiologist (physician) and Advanced Practice Providers (APPs -  Physician Assistants and Nurse Practitioners) who all work together to provide you with the care you need, when you need it.  You will need a follow up appointment in 12 months  Providers on your designated Care Team:   Christopher Berge, NP Ryan Dunn, PA-C Cadence Furth, PA-C  COVID-19 Vaccine Information can be found at: [email or web address] For questions related to vaccine distribution or appointments, please email [email or web address] or call [phone].   

## 2021-09-19 ENCOUNTER — Other Ambulatory Visit: Payer: Medicare HMO

## 2021-09-19 ENCOUNTER — Other Ambulatory Visit: Payer: Self-pay

## 2021-09-19 DIAGNOSIS — I10 Essential (primary) hypertension: Secondary | ICD-10-CM | POA: Diagnosis not present

## 2021-09-19 DIAGNOSIS — R319 Hematuria, unspecified: Secondary | ICD-10-CM | POA: Diagnosis not present

## 2021-09-19 DIAGNOSIS — E7800 Pure hypercholesterolemia, unspecified: Secondary | ICD-10-CM

## 2021-09-19 DIAGNOSIS — Z79899 Other long term (current) drug therapy: Secondary | ICD-10-CM

## 2021-09-19 DIAGNOSIS — R5381 Other malaise: Secondary | ICD-10-CM | POA: Diagnosis not present

## 2021-09-19 DIAGNOSIS — E041 Nontoxic single thyroid nodule: Secondary | ICD-10-CM | POA: Diagnosis not present

## 2021-09-19 DIAGNOSIS — E118 Type 2 diabetes mellitus with unspecified complications: Secondary | ICD-10-CM

## 2021-09-20 LAB — COMPREHENSIVE METABOLIC PANEL
AG Ratio: 1.8 (calc) (ref 1.0–2.5)
ALT: 13 U/L (ref 6–29)
AST: 13 U/L (ref 10–35)
Albumin: 4.1 g/dL (ref 3.6–5.1)
Alkaline phosphatase (APISO): 73 U/L (ref 37–153)
BUN: 16 mg/dL (ref 7–25)
CO2: 32 mmol/L (ref 20–32)
Calcium: 9.5 mg/dL (ref 8.6–10.4)
Chloride: 102 mmol/L (ref 98–110)
Creat: 0.99 mg/dL (ref 0.50–1.05)
Globulin: 2.3 g/dL (calc) (ref 1.9–3.7)
Glucose, Bld: 155 mg/dL — ABNORMAL HIGH (ref 65–99)
Potassium: 4.3 mmol/L (ref 3.5–5.3)
Sodium: 141 mmol/L (ref 135–146)
Total Bilirubin: 0.6 mg/dL (ref 0.2–1.2)
Total Protein: 6.4 g/dL (ref 6.1–8.1)

## 2021-09-20 LAB — CBC WITH DIFFERENTIAL/PLATELET
Absolute Monocytes: 412 cells/uL (ref 200–950)
Basophils Absolute: 59 cells/uL (ref 0–200)
Basophils Relative: 0.6 %
Eosinophils Absolute: 137 cells/uL (ref 15–500)
Eosinophils Relative: 1.4 %
HCT: 41.6 % (ref 35.0–45.0)
Hemoglobin: 14.1 g/dL (ref 11.7–15.5)
Lymphs Abs: 2607 cells/uL (ref 850–3900)
MCH: 30.8 pg (ref 27.0–33.0)
MCHC: 33.9 g/dL (ref 32.0–36.0)
MCV: 90.8 fL (ref 80.0–100.0)
MPV: 10.3 fL (ref 7.5–12.5)
Monocytes Relative: 4.2 %
Neutro Abs: 6586 cells/uL (ref 1500–7800)
Neutrophils Relative %: 67.2 %
Platelets: 106 10*3/uL — ABNORMAL LOW (ref 140–400)
RBC: 4.58 10*6/uL (ref 3.80–5.10)
RDW: 13.2 % (ref 11.0–15.0)
Total Lymphocyte: 26.6 %
WBC: 9.8 10*3/uL (ref 3.8–10.8)

## 2021-09-20 LAB — LIPID PANEL
Cholesterol: 182 mg/dL (ref <200)
HDL: 34 mg/dL — ABNORMAL LOW (ref >=50)
LDL Cholesterol (Calc): 114 mg/dL (calc) — ABNORMAL HIGH
Non-HDL Cholesterol (Calc): 148 mg/dL (calc) — ABNORMAL HIGH (ref <130)
Total CHOL/HDL Ratio: 5.4 (calc) — ABNORMAL HIGH (ref <5.0)
Triglycerides: 220 mg/dL — ABNORMAL HIGH (ref <150)

## 2021-09-20 LAB — HEMOGLOBIN A1C
Hgb A1c MFr Bld: 6.8 % of total Hgb — ABNORMAL HIGH (ref <5.7)
Mean Plasma Glucose: 148 mg/dL
eAG (mmol/L): 8.2 mmol/L

## 2021-09-20 LAB — TSH: TSH: 2.57 mIU/L (ref 0.40–4.50)

## 2021-09-24 ENCOUNTER — Encounter: Payer: Self-pay | Admitting: Family Medicine

## 2021-09-24 ENCOUNTER — Other Ambulatory Visit: Payer: Self-pay

## 2021-09-24 ENCOUNTER — Ambulatory Visit (INDEPENDENT_AMBULATORY_CARE_PROVIDER_SITE_OTHER): Payer: Medicare HMO | Admitting: Family Medicine

## 2021-09-24 VITALS — BP 108/58 | HR 68 | Temp 97.2°F | Resp 18 | Ht 66.0 in | Wt 274.0 lb

## 2021-09-24 DIAGNOSIS — Z1231 Encounter for screening mammogram for malignant neoplasm of breast: Secondary | ICD-10-CM | POA: Diagnosis not present

## 2021-09-24 DIAGNOSIS — I890 Lymphedema, not elsewhere classified: Secondary | ICD-10-CM | POA: Diagnosis not present

## 2021-09-24 DIAGNOSIS — E7800 Pure hypercholesterolemia, unspecified: Secondary | ICD-10-CM | POA: Diagnosis not present

## 2021-09-24 DIAGNOSIS — E113299 Type 2 diabetes mellitus with mild nonproliferative diabetic retinopathy without macular edema, unspecified eye: Secondary | ICD-10-CM

## 2021-09-24 DIAGNOSIS — E118 Type 2 diabetes mellitus with unspecified complications: Secondary | ICD-10-CM

## 2021-09-24 DIAGNOSIS — I10 Essential (primary) hypertension: Secondary | ICD-10-CM

## 2021-09-24 MED ORDER — ROSUVASTATIN CALCIUM 20 MG PO TABS: 20.0000 mg | ORAL_TABLET | Freq: Every day | ORAL | 3 refills | Status: DC

## 2021-09-24 NOTE — Progress Notes (Signed)
Subjective:    Patient ID: Linda Briggs, female    DOB: 12/07/1955, 66 y.o.   MRN: [id number]  Patient is here today for a follow-up on her diabetes.  She reports hypotensive episodes at home.  Her systolic blood pressure can get into the low 90s.  She denies any dizziness or syncope or near syncope.  She is taking metoprolol for palpitations which occur primarily in the sleeping and also lisinopril for renal protection.  Today her blood pressure is low.  However she is asymptomatic.  I informed the patient that if her systolic blood pressure drops below 100 I want her to discontinue lisinopril.  She is still receiving some renal protection from [place].  She continues to have numbness in both of her feet and fibrotic skin changes secondary to chronic lymphedema in both legs.  She is seeing the podiatrist.  She denies any active sores or lesions on her feet.  She denies any chest pain shortness of breath or dyspnea on exertion Lab on 09/19/2021  Component Date Value Ref Range Status   TSH 09/19/2021 2.57  0.40 - 4.50 mIU/L Final   Cholesterol 09/19/2021 182  <200 mg/dL Final   HDL 40/98/119101/18/2023 34 (L)  > OR = 50 mg/dL Final   Triglycerides 47/82/956201/18/2023 220 (H)  <150 mg/dL Final   Comment: . If a non-fasting specimen was collected, consider repeat triglyceride testing on a fasting specimen if clinically indicated.  Perry MountJacobson et al. J. of Clin. Lipidol. 2015;9:129-169. Marland Kitchen.    LDL Cholesterol (Calc) 09/19/2021 114 (H)  mg/dL (calc) Final   Comment: Reference range: <100 . Desirable range <100 mg/dL for primary prevention;   <70 mg/dL for patients with CHD or diabetic patients  with > or = 2 CHD risk factors. Marland Kitchen. LDL-C is now calculated using the Martin-Hopkins  calculation, which is a validated novel method providing  better accuracy than the Friedewald equation in the  estimation of LDL-C.  Horald PollenMartin SS et al. Lenox AhrJAMA. 1308;657(842013;310(19): 2061-2068  (http://education.[facility].com/faq/FAQ164)     Total CHOL/HDL Ratio 09/19/2021 5.4 (H)  <5.0 (calc) Final   Non-HDL Cholesterol (Calc) 09/19/2021 148 (H)  <130 mg/dL (calc) Final   Comment: For patients with diabetes plus 1 major ASCVD risk  factor, treating to a non-HDL-C goal of <100 mg/dL  (LDL-C of <69<70 mg/dL) is considered a therapeutic  option.    Hgb A1c MFr Bld 09/19/2021 6.8 (H)  <5.7 % of total Hgb Final   Comment: For someone without known diabetes, a hemoglobin A1c value of 6.5% or greater indicates that they may have  diabetes and this should be confirmed with a follow-up  test. . For someone with known diabetes, a value <7% indicates  that their diabetes is well controlled and a value  greater than or equal to 7% indicates suboptimal  control. A1c targets should be individualized based on  duration of diabetes, age, comorbid conditions, and  other considerations. . Currently, no consensus exists regarding use of hemoglobin A1c for diagnosis of diabetes for children. .    Mean Plasma Glucose 09/19/2021 148  mg/dL Final   eAG (mmol/L) 62/95/284101/18/2023 8.2  mmol/L Final   Glucose, Bld 09/19/2021 155 (H)  65 - 99 mg/dL Final   Comment: .            Fasting reference interval . For someone without known diabetes, a glucose value >125 mg/dL indicates that they may have diabetes and this should be confirmed with a follow-up test. .  BUN 09/19/2021 16  7 - 25 mg/dL Final   Creat 62/37/6283 0.99  0.50 - 1.05 mg/dL Final   BUN/Creatinine Ratio 09/19/2021 NOT APPLICABLE  6 - 22 (calc) Final   Sodium 09/19/2021 141  135 - 146 mmol/L Final   Potassium 09/19/2021 4.3  3.5 - 5.3 mmol/L Final   Chloride 09/19/2021 102  98 - 110 mmol/L Final   CO2 09/19/2021 32  20 - 32 mmol/L Final   Calcium 09/19/2021 9.5  8.6 - 10.4 mg/dL Final   Total Protein 15/17/6160 6.4  6.1 - 8.1 g/dL Final   Albumin 73/71/0626 4.1  3.6 - 5.1 g/dL Final   Globulin 94/85/4627 2.3  1.9 - 3.7 g/dL (calc) Final   AG Ratio 09/19/2021 1.8  1.0 - 2.5  (calc) Final   Total Bilirubin 09/19/2021 0.6  0.2 - 1.2 mg/dL Final   Alkaline phosphatase (APISO) 09/19/2021 73  37 - 153 U/L Final   AST 09/19/2021 13  10 - 35 U/L Final   ALT 09/19/2021 13  6 - 29 U/L Final   WBC 09/19/2021 9.8  3.8 - 10.8 Thousand/uL Final   RBC 09/19/2021 4.58  3.80 - 5.10 Million/uL Final   Hemoglobin 09/19/2021 14.1  11.7 - 15.5 g/dL Final   HCT 03/50/0938 41.6  35.0 - 45.0 % Final   MCV 09/19/2021 90.8  80.0 - 100.0 fL Final   MCH 09/19/2021 30.8  27.0 - 33.0 pg Final   MCHC 09/19/2021 33.9  32.0 - 36.0 g/dL Final   RDW 18/29/9371 13.2  11.0 - 15.0 % Final   Platelets 09/19/2021 106 (L)  140 - 400 Thousand/uL Final   MPV 09/19/2021 10.3  7.5 - 12.5 fL Final   Neutro Abs 09/19/2021 6,586  1,500 - 7,800 cells/uL Final   Lymphs Abs 09/19/2021 2,607  850 - 3,900 cells/uL Final   Absolute Monocytes 09/19/2021 412  200 - 950 cells/uL Final   Eosinophils Absolute 09/19/2021 137  15 - 500 cells/uL Final   Basophils Absolute 09/19/2021 59  0 - 200 cells/uL Final   Neutrophils Relative % 09/19/2021 67.2  % Final   Total Lymphocyte 09/19/2021 26.6  % Final   Monocytes Relative 09/19/2021 4.2  % Final   Eosinophils Relative 09/19/2021 1.4  % Final   Basophils Relative 09/19/2021 0.6  % Final    Past Medical History:  Diagnosis Date   Arthritis    feet & toes   Bulging disc    Diabetes mellitus    Gastroparesis    History of hepatitis B    Hyperlipidemia    Hypertension    Neuromuscular disorder (HCC)    in feet and fingers    Neuropathy    feet   Nonproliferative diabetic retinopathy (HCC)    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    Peripheral vascular disease (HCC)    Shortness of breath dyspnea    on lopressor    Sleep apnea    pt had study done but it was incomplete due to blood sugar issues    Past Surgical History:  Procedure Laterality Date   BIOPSY  04/28/2018   Procedure: BIOPSY;  Surgeon: Meryl Dare, MD;  Location: [facility];   Service: Endoscopy;;   BIOPSY THYROID     january 2016   ESOPHAGOGASTRODUODENOSCOPY (EGD) WITH PROPOFOL N/A 04/28/2018   Procedure: ESOPHAGOGASTRODUODENOSCOPY (EGD) WITH PROPOFOL;  Surgeon: Meryl Dare, MD;  Location: [facility];  Service: Endoscopy;  Laterality: N/A;  HYSTEROSCOPY WITH D & C N/A 02/15/2015   Procedure: DILATATION AND CURETTAGE /HYSTEROSCOPY;  Surgeon: Mitchel Honour, DO;  Location: [facility];  Service: Gynecology;  Laterality: N/A;   TONSILLECTOMY     Current Outpatient Medications on File Prior to Visit  Medication Sig Dispense Refill   Accu-Chek Softclix Lancets lancets TEST BLOOD SUGAR THREE TIMES DAILY 300 each 1   Alcohol Swabs (DROPSAFE ALCOHOL PREP) 70 % PADS USE THREE TIMES DAILY 300 each 1   aspirin EC 81 MG tablet Take 81 mg by mouth daily.     Blood Glucose Calibration (TRUE METRIX LEVEL 2) Normal SOLN Use as directed to monitor FSBS 3x daily. Dx: E11.65 1 each 1   Blood Glucose Monitoring Suppl (TRUE METRIX AIR GLUCOSE METER) DEVI 1 each by Does not apply route daily. Use as directed to monitor FSBS 3x daily. Dx: E11.65 1 each 5   FARXIGA 10 MG TABS tablet TAKE 1 TABLET EVERY DAY BEFORE BREAKFAST 90 tablet 3   furosemide (LASIX) 20 MG tablet TAKE 1 TABLET EVERY DAY 90 tablet 0   gabapentin (NEURONTIN) 300 MG capsule TAKE 1 CAPSULE THREE TIMES DAILY 270 capsule 1   glipiZIDE (GLUCOTROL XL) 5 MG 24 hr tablet TAKE 1 TABLET  DAILY WITH BREAKFAST. (Patient not taking: Reported on 08/09/2021) 90 tablet 1   glucose blood (ACCU-CHEK GUIDE) test strip TEST BLOOD SUGAR THREE TIMES DAILY. DX: E11.9. 300 strip 1   lisinopril (ZESTRIL) 5 MG tablet TAKE 1 TABLET (5 MG TOTAL) BY MOUTH DAILY. 90 tablet 3   metFORMIN (GLUCOPHAGE) 1000 MG tablet TAKE 1 TABLET TWICE DAILY WITH A MEAL 180 tablet 1   metoprolol tartrate (LOPRESSOR) 50 MG tablet Take 0.5 tablets (25 mg total) by mouth 2 (two) times daily. 90 tablet 0   Multiple Vitamin (MULTIVITAMIN) tablet Take 1 tablet by mouth  daily.     ondansetron (ZOFRAN) 4 MG tablet TAKE 1 TABLET (4 MG TOTAL) BY MOUTH EVERY 8 (EIGHT) HOURS AS NEEDED FOR NAUSEA OR VOMITING. 60 tablet 3   pantoprazole (PROTONIX) 40 MG tablet TAKE 1 TABLET (40 MG TOTAL) BY MOUTH 2 (TWO) TIMES DAILY BEFORE  MEALS 180 tablet 2   potassium chloride (KLOR-CON) 10 MEQ tablet TAKE 1 TABLET EVERY DAY 90 tablet 1   pravastatin (PRAVACHOL) 40 MG tablet TAKE 1 TABLET EVERY DAY 90 tablet 3   traMADol (ULTRAM) 50 MG tablet TAKE 1 TABLET BY MOUTH EVERY 6 HOURS AS NEEDED FOR  CHRONIC  BACK  PAIN 60 tablet 0   TRULICITY 1.5 MG/0.5ML SOPN INJECT 1.5MG  (1 PEN) SUBCUTANEOUSLY EVERY WEEK AS DIRECTED 12 mL 1   No current facility-administered medications on file prior to visit.   Allergies  Allergen Reactions   Codeine Anaphylaxis and Hives   Penicillins Anaphylaxis, Hives and Other (See Comments)    Has patient had a PCN reaction causing immediate rash, facial/tongue/throat swelling, SOB or lightheadedness with hypotension: No Has patient had a PCN reaction causing severe rash involving mucus membranes or skin necrosis: No Has patient had a PCN reaction that required hospitalization: No Has patient had a PCN reaction occurring within the last 10 years: No If all of the above answers are "NO", then may proceed with Cephalosporin use.    Ciprofloxacin Other (See Comments)    Tongue Swelling   Food Nausea And Vomiting and Other (See Comments)    Magnus Ivan [Sitagliptin] Other (See Comments)    Stomach cramps   Latex Hives  Social History   Socioeconomic History   Marital status: Single    Spouse name: Not on file   Number of children: 0   Years of education: Not on file   Highest education level: Not on file  Occupational History   Occupation: retired  Tobacco Use   Smoking status: Never   Smokeless tobacco: Never  Vaping Use   Vaping Use: Never used  Substance and Sexual Activity   Alcohol use: No    Comment: social drinker.   Drug use:  No   Sexual activity: Not on file  Other Topics Concern   Not on file  Social History Narrative   Lives alone. 1 brother living.    Social Determinants of Health   Financial Resource Strain: Low Risk    Difficulty of Paying Living Expenses: Not very hard  Food Insecurity: No Food Insecurity   Worried About Programme researcher, broadcasting/film/videounning Out of Food in the Last Year: Never true   Ran Out of Food in the Last Year: Never true  Transportation Needs: No Transportation Needs   Lack of Transportation (Medical): No   Lack of Transportation (Non-Medical): No  Physical Activity: Inactive   Days of Exercise per Week: 0 days   Minutes of Exercise per Session: 0 min  Stress: No Stress Concern Present   Feeling of Stress : Not at all  Social Connections: Moderately Integrated   Frequency of Communication with Friends and Family: More than three times a week   Frequency of Social Gatherings with Friends and Family: More than three times a week   Attends Religious Services: More than 4 times per year   Active Member of [employer] Financial[employer]ubs or Organizations: Yes   Attends Engineer, structuralClub or Organization Meetings: More than 4 times per year   Marital Status: Never married  Catering managerntimate Partner Violence: Not At Risk   Fear of Current or Ex-Partner: No   Emotionally Abused: No   Physically Abused: No   Sexually Abused: No   Family History  Problem Relation Age of Onset   Hyperlipidemia Mother    Heart disease Father    Hyperlipidemia Father    Hypertension Father    Heart disease Brother    Kidney disease Sister    Kidney cancer Sister    Ovarian cancer Maternal Aunt    Kidney disease Maternal Aunt        x 2   Kidney disease Maternal Uncle        x 2   Stroke Brother    Heart disease Brother    Colon cancer Neg Hx    Stomach cancer Neg Hx       Review of Systems  All other systems reviewed and are negative.     Objective:   Physical Exam Vitals reviewed.  Constitutional:      General: She is not in acute distress.     Appearance: She is well-developed. She is not diaphoretic.  HENT:     Head: Normocephalic and atraumatic.     Right Ear: External ear normal.     Left Ear: External ear normal.     Nose: Nose normal.     Mouth/Throat:     Pharynx: No oropharyngeal exudate.  Eyes:     General: No scleral icterus.       Right eye: No discharge.        Left eye: No discharge.     Conjunctiva/sclera: Conjunctivae normal.     Pupils: Pupils are equal, round, and reactive to  light.  Neck:     Thyroid: No thyromegaly.     Vascular: No JVD.     Trachea: No tracheal deviation.  Cardiovascular:     Rate and Rhythm: Normal rate and regular rhythm.     Heart sounds: Normal heart sounds. No murmur heard.   No friction rub. No gallop.  Pulmonary:     Effort: Pulmonary effort is normal. No respiratory distress.     Breath sounds: Normal breath sounds. No stridor. No wheezing or rales.  Chest:     Chest wall: No tenderness.  Abdominal:     General: Bowel sounds are normal. There is no distension.     Palpations: Abdomen is soft. There is no mass.     Tenderness: There is no abdominal tenderness. There is no guarding or rebound.  Musculoskeletal:        General: No tenderness. Normal range of motion.     Cervical back: Normal range of motion and neck supple.  Lymphadenopathy:     Cervical: No cervical adenopathy.  Skin:    General: Skin is warm.     Coloration: Skin is not pale.     Findings: No erythema or rash.  Neurological:     Mental Status: She is alert and oriented to person, place, and time.     Cranial Nerves: No cranial nerve deficit.     Motor: No abnormal muscle tone.     Coordination: Coordination normal.     Deep Tendon Reflexes: Reflexes are normal and symmetric.  Psychiatric:        Behavior: Behavior normal.        Thought Content: Thought content normal.        Judgment: Judgment normal.        Assessment & Plan:   Type 2 diabetes mellitus with complications (HCC)  Pure  hypercholesterolemia  Essential hypertension  Lymphedema  Morbid obesity (HCC)  Nonproliferative diabetic retinopathy (HCC) Patient is due for mammogram.  She will allow me to schedule this for her but she continues to decline colonoscopy.  Blood pressure today is low.  If the patient's systolic blood pressure drops below 100, I want her to discontinue lisinopril.  LDL cholesterol is elevated.  I recommended switching the patient from pravastatin 40 mg a day to Crestor 20 mg a day.  I will try to drop her LDL cholesterol below 100 given the diabetes.  A1c is excellent at 6.8.  Continue to encourage exercise and weight loss.

## 2021-10-02 ENCOUNTER — Other Ambulatory Visit: Payer: Self-pay | Admitting: Family Medicine

## 2021-11-14 ENCOUNTER — Other Ambulatory Visit: Payer: Self-pay | Admitting: Family Medicine

## 2021-12-10 ENCOUNTER — Telehealth: Payer: Self-pay

## 2021-12-10 NOTE — Telephone Encounter (Signed)
Called pt. Pt voiced underestanding. ?

## 2021-12-10 NOTE — Telephone Encounter (Signed)
Pt called and stated that she is having severe reactions to the new chol meds-Rosuastatin, such fatigue and muscle pains. Per pt tried taking meds at 3 different times, however believes that its the medication. ? ?Please advise ?

## 2021-12-13 DIAGNOSIS — E113392 Type 2 diabetes mellitus with moderate nonproliferative diabetic retinopathy without macular edema, left eye: Secondary | ICD-10-CM | POA: Diagnosis not present

## 2021-12-13 DIAGNOSIS — E113291 Type 2 diabetes mellitus with mild nonproliferative diabetic retinopathy without macular edema, right eye: Secondary | ICD-10-CM | POA: Diagnosis not present

## 2021-12-13 DIAGNOSIS — H40013 Open angle with borderline findings, low risk, bilateral: Secondary | ICD-10-CM | POA: Diagnosis not present

## 2021-12-13 DIAGNOSIS — H353131 Nonexudative age-related macular degeneration, bilateral, early dry stage: Secondary | ICD-10-CM | POA: Diagnosis not present

## 2021-12-13 LAB — HM DIABETES EYE EXAM

## 2021-12-24 NOTE — Progress Notes (Signed)
?[facility] - Clinic Note ? ?12/27/2021 ? ?  ? ?CHIEF COMPLAINT ?Patient presents for Retina Evaluation ? ? ?HISTORY OF PRESENT ILLNESS: ?Linda Briggs is a 66 y.o. female who presents to the clinic today for:  ? ?HPI   ? ? Retina Evaluation   ?In both eyes.  I, the attending physician,  performed the HPI with the patient and updated documentation appropriately. ? ?  ?  ? ? Comments   ?Retina eval per Dr. Zenaida Niece for a bump or raised are in retina unsure which eye. Vision stable, no symptoms going along with this.  She had a history of FOLs about 8-12 months ago.  This has resolved.   ?DM II x10 years.  BS controlled, A1C 6.8 (was taken off 2 of her diabetic medications).  ? ?  ?  ?Last edited by Rennis Chris, MD on 01/01/2022  7:58 PM.  ?  ?Pt is here on the referral of Dr. Zenaida Niece for concern of choroidal lesion, pt states she sees her every 8-12 months for diabetic checks, pt states Dr. Zenaida Niece told her she had bleeding in her eye that wasn't typical of diabetics, so she sent the pt to have her carotid arteries scanned, pt states they were clear, pt saw Dr. Zenaida Niece on April 13 when she saw a "bump" in her right eye, pt states her last A1c was 6.8, she has been taken off 2 of her diabetic medications since her A1c is below 7.0 ? ?Referring physician: ?Linda Foley, MD ?[name] ?[name],  [place] [address] ? ?HISTORICAL INFORMATION:  ? ?Selected notes from the MEDICAL RECORD NUMBER ?Referred by Dr. Zenaida Niece for retinal eval ?LEE:  ?Ocular Hx- ?PMH- ?  ? ?CURRENT MEDICATIONS: ?No current outpatient medications on file. (Ophthalmic Drugs)  ? ?No current facility-administered medications for this visit. (Ophthalmic Drugs)  ? ?Current Outpatient Medications (Other)  ?Medication Sig  ? aspirin EC 81 MG tablet Take 81 mg by mouth daily.  ? FARXIGA 10 MG TABS tablet TAKE 1 TABLET EVERY DAY BEFORE BREAKFAST  ? furosemide (LASIX) 20 MG tablet TAKE 1 TABLET EVERY DAY  ? gabapentin (NEURONTIN) 300 MG capsule TAKE 1  CAPSULE THREE TIMES DAILY  ? lisinopril (ZESTRIL) 5 MG tablet TAKE 1 TABLET (5 MG TOTAL) BY MOUTH DAILY.  ? metFORMIN (GLUCOPHAGE) 1000 MG tablet TAKE 1 TABLET TWICE DAILY WITH A MEAL  ? metoprolol tartrate (LOPRESSOR) 50 MG tablet TAKE 1/2 TABLET TWICE DAILY  ? Multiple Vitamin (MULTIVITAMIN) tablet Take 1 tablet by mouth daily.  ? ondansetron (ZOFRAN) 4 MG tablet TAKE 1 TABLET (4 MG TOTAL) BY MOUTH EVERY 8 (EIGHT) HOURS AS NEEDED FOR NAUSEA OR VOMITING.  ? pantoprazole (PROTONIX) 40 MG tablet TAKE 1 TABLET (40 MG TOTAL) BY MOUTH 2 (TWO) TIMES DAILY BEFORE  MEALS  ? potassium chloride (KLOR-CON) 10 MEQ tablet TAKE 1 TABLET EVERY DAY  ? traMADol (ULTRAM) 50 MG tablet TAKE 1 TABLET BY MOUTH EVERY 6 HOURS AS NEEDED FOR  CHRONIC  BACK  PAIN  ? TRULICITY 1.5 MG/0.5ML SOPN INJECT 1.5MG  (1 PEN) SUBCUTANEOUSLY EVERY WEEK AS DIRECTED  ? Accu-Chek Softclix Lancets lancets TEST BLOOD SUGAR THREE TIMES DAILY  ? Alcohol Swabs (DROPSAFE ALCOHOL PREP) 70 % PADS USE THREE TIMES DAILY  ? Blood Glucose Calibration (TRUE METRIX LEVEL 2) Normal SOLN Use as directed to monitor FSBS 3x daily. Dx: E11.65  ? Blood Glucose Monitoring Suppl (TRUE METRIX AIR GLUCOSE METER) DEVI 1 each by Does  not apply route daily. Use as directed to monitor FSBS 3x daily. Dx: E11.65  ? glucose blood (ACCU-CHEK GUIDE) test strip TEST BLOOD SUGAR THREE TIMES DAILY.  ? pravastatin (PRAVACHOL) 40 MG tablet TAKE 1 TABLET EVERY DAY (Patient not taking: Reported on 12/27/2021)  ? rosuvastatin (CRESTOR) 20 MG tablet Take 1 tablet (20 mg total) by mouth daily. Stop pravastatin (Patient not taking: Reported on 12/27/2021)  ? ?No current facility-administered medications for this visit. (Other)  ? ?REVIEW OF SYSTEMS: ?ROS   ?Positive for: Endocrine, Eyes ?Negative for: Constitutional, Gastrointestinal, Neurological, Skin, Genitourinary, Musculoskeletal, HENT, Cardiovascular, Respiratory, Psychiatric, Allergic/Imm, Heme/Lymph ?Last edited by Joni ReiningHodges, Robin, COA on  12/27/2021  1:37 PM.  ?  ? ?ALLERGIES ?Allergies  ?Allergen Reactions  ? Codeine Anaphylaxis and Hives  ? Penicillins Anaphylaxis, Hives and Other (See Comments)  ?  Has patient had a PCN reaction causing immediate rash, facial/tongue/throat swelling, SOB or lightheadedness with hypotension: No ?Has patient had a PCN reaction causing severe rash involving mucus membranes or skin necrosis: No ?Has patient had a PCN reaction that required hospitalization: No ?Has patient had a PCN reaction occurring within the last 10 years: No ?If all of the above answers are "NO", then may proceed with Cephalosporin use. ?  ? Ciprofloxacin Other (See Comments)  ?  Tongue Swelling  ? Food Nausea And Vomiting and Other (See Comments)  ?  Olives   ? Januvia [Sitagliptin] Other (See Comments)  ?  Stomach cramps  ? Latex Hives  ? ?PAST MEDICAL HISTORY ?Past Medical History:  ?Diagnosis Date  ? Arthritis   ? feet & toes  ? Bulging disc   ? Diabetes mellitus   ? Gastroparesis   ? History of hepatitis B   ? Hyperlipidemia   ? Hypertension   ? Neuromuscular disorder (HCC)   ? in feet and fingers   ? Neuropathy   ? feet  ? Nonproliferative diabetic retinopathy (HCC)   ? Periodic heart flutter   ? on lopressor   ? Peripheral vascular disease (HCC)   ? Shortness of breath dyspnea   ? on lopressor   ? Sleep apnea   ? pt had study done but it was incomplete due to blood sugar issues   ? ?Past Surgical History:  ?Procedure Laterality Date  ? BIOPSY  04/28/2018  ? Procedure: BIOPSY;  Surgeon: Meryl DareStark, Malcolm T, MD;  [facility];  Service: Endoscopy;;  ? BIOPSY THYROID    ? january 2016  ? ESOPHAGOGASTRODUODENOSCOPY (EGD) WITH PROPOFOL N/A 04/28/2018  ? Procedure: ESOPHAGOGASTRODUODENOSCOPY (EGD) WITH PROPOFOL;  Surgeon: Meryl DareStark, Malcolm T, MD;  Location: [facility];  Service: Endoscopy;  Laterality: N/A;  ? HYSTEROSCOPY WITH D & C N/A 02/15/2015  ? Procedure: DILATATION AND CURETTAGE /HYSTEROSCOPY;  Surgeon: Mitchel HonourMegan Morris, DO;  Location: [facility];  Service: Gynecology;  Laterality: N/A;  ? TONSILLECTOMY    ? ?FAMILY HISTORY ?Family History  ?Problem Relation Age of Onset  ? Hyperlipidemia Mother   ? Heart disease Father   ? Hyperlipidemia Father   ? Hypertension Father   ? Heart disease Brother   ? Kidney disease Sister   ? Kidney cancer Sister   ? Ovarian cancer Maternal Aunt   ? Kidney disease Maternal Aunt   ?     x 2  ? Kidney disease Maternal Uncle   ?     x 2  ? Stroke Brother   ? Heart disease Brother   ? Colon cancer Neg Hx   ?  Stomach cancer Neg Hx   ? ?SOCIAL HISTORY ?Social History  ? ?Tobacco Use  ? Smoking status: Never  ? Smokeless tobacco: Never  ?Vaping Use  ? Vaping Use: Never used  ?Substance Use Topics  ? Alcohol use: No  ?  Comment: social drinker.  ? Drug use: No  ?  ? ?  ?OPHTHALMIC EXAM: ? ?Base Eye Exam   ? ? Visual Acuity (Snellen - Linear)   ? ?   Right Left  ? Dist [place] 20/30 +2 20/25 +2  ? Dist ph [place] 20/20 20/20  ? ?  ?  ? ? Tonometry (Tonopen, 1:45 PM)   ? ?   Right Left  ? Pressure 18 16  ? ?  ?  ? ? Pupils   ? ?   Dark Light Shape React APD  ? Right 3 2 Round Brisk None  ? Left 3 2 Round Brisk None  ? ?  ?  ? ? Visual Fields (Counting fingers)   ? ?   Left Right  ?  Full Full  ? ?  ?  ? ? Extraocular Movement   ? ?   Right Left  ?  Full Full  ? ?  ?  ? ? Neuro/Psych   ? ? Oriented x3: Yes  ? Mood/Affect: Normal  ? ?  ?  ? ? Dilation   ? ? Both eyes: 1.0% Mydriacyl, 2.5% Phenylephrine @ 1:45 PM  ? ?  ?  ? ?  ? ?Slit Lamp and Fundus Exam   ? ? Slit Lamp Exam   ? ?   Right Left  ? Lids/Lashes Dermatochalasis - upper lid Dermatochalasis - upper lid  ? Conjunctiva/Sclera White and quiet temporal pinguecula  ? Cornea 1+ Punctate epithelial erosions 1+ Punctate epithelial erosions  ? Anterior Chamber Deep and quiet Deep and quiet  ? Iris Round and dilated, No NVI Round and dilated, No NVI  ? Lens 2+ Nuclear sclerosis, 2+ Cortical cataract 2+ Nuclear sclerosis, 2+ Cortical cataract  ? Anterior Vitreous Vitreous syneresis Vitreous  syneresis  ? ?  ?  ? ? Fundus Exam   ? ?   Right Left  ? Disc Pink and Sharp Pink and Sharp  ? C/D Ratio 0.6 0.6  ? Macula Blunted foveal reflex, [place], RPE mottling, focal MA nasal to fovea Blunted foveal reflex

## 2021-12-25 NOTE — Telephone Encounter (Signed)
Seen in December unsigned rx unable to close encounter.  ?

## 2021-12-27 ENCOUNTER — Encounter (INDEPENDENT_AMBULATORY_CARE_PROVIDER_SITE_OTHER): Payer: Self-pay | Admitting: Ophthalmology

## 2021-12-27 ENCOUNTER — Ambulatory Visit (INDEPENDENT_AMBULATORY_CARE_PROVIDER_SITE_OTHER): Payer: Medicare HMO | Admitting: Ophthalmology

## 2021-12-27 DIAGNOSIS — H25813 Combined forms of age-related cataract, bilateral: Secondary | ICD-10-CM

## 2021-12-27 DIAGNOSIS — E119 Type 2 diabetes mellitus without complications: Secondary | ICD-10-CM

## 2021-12-27 DIAGNOSIS — H3581 Retinal edema: Secondary | ICD-10-CM

## 2021-12-27 DIAGNOSIS — H318 Other specified disorders of choroid: Secondary | ICD-10-CM

## 2021-12-27 DIAGNOSIS — I10 Essential (primary) hypertension: Secondary | ICD-10-CM

## 2021-12-27 DIAGNOSIS — H353131 Nonexudative age-related macular degeneration, bilateral, early dry stage: Secondary | ICD-10-CM | POA: Diagnosis not present

## 2021-12-27 DIAGNOSIS — H35033 Hypertensive retinopathy, bilateral: Secondary | ICD-10-CM

## 2021-12-27 DIAGNOSIS — H35413 Lattice degeneration of retina, bilateral: Secondary | ICD-10-CM

## 2022-01-01 ENCOUNTER — Encounter (INDEPENDENT_AMBULATORY_CARE_PROVIDER_SITE_OTHER): Payer: Self-pay | Admitting: Ophthalmology

## 2022-01-07 ENCOUNTER — Other Ambulatory Visit: Payer: Self-pay | Admitting: Family Medicine

## 2022-01-08 ENCOUNTER — Other Ambulatory Visit: Payer: Self-pay | Admitting: Family Medicine

## 2022-01-08 NOTE — Telephone Encounter (Signed)
Requested medication (s) are due for refill today - yes ? ?Requested medication (s) are on the active medication list -yes ? ?Future visit scheduled -yes ? ?Last refill: 05/11/21 #300 1RF ? ?Notes to clinic: Off protocol supply ? ?Requested Prescriptions  ?Pending Prescriptions Disp Refills  ? Alcohol Swabs (DROPSAFE ALCOHOL PREP) 70 % PADS [Pharmacy Med Name: DROPSAFE ALCOHOL PREP PADS 70 % Pad] 300 each 1  ?  Sig: USE THREE TIMES DAILY  ?  ? Off-Protocol Failed - 01/07/2022  1:05 PM  ?  ?  Failed - Medication not assigned to a protocol, review manually.  ?  ?  Passed - Valid encounter within last 12 months  ?  Recent Outpatient Visits   ? ?      ? 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 [facility] Pickard, Cammie Mcgee, MD  ? 7 months ago Fever, unspecified fever cause  ? [place], Jessica A, NP  ? 9 months ago Type 2 diabetes mellitus with complications ([place])  ? [facility] Pickard, Cammie Mcgee, MD  ? 1 year ago Type 2 diabetes mellitus with complications [facility])  ? [facility] Pickard, Cammie Mcgee, MD  ? 1 year ago Type 2 diabetes mellitus with complications [facility])  ? [facility] Pickard, Cammie Mcgee, MD  ? ?  ?  ?Future Appointments   ? ?        ? In 2 months Pickard, Cammie Mcgee, MD [place], [facility]  ? ?  ? ? ?  ?  ?  ?Signed Prescriptions Disp Refills  ? TRULICITY 1.5 VO/3.5KK SOPN 12 mL 1  ?  Sig: INJECT 1.5MG (1 PEN) SUBCUTANEOUSLY EVERY WEEK AS DIRECTED  ?  ? Endocrinology:  Diabetes - GLP-1 Receptor Agonists Passed - 01/07/2022  1:05 PM  ?  ?  Passed - HBA1C is between 0 and 7.9 and within 180 days  ?  Hgb A1c MFr Bld  ?Date Value Ref Range Status  ?09/19/2021 6.8 (H) <5.7 % of total Hgb Final  ?  Comment:  ?  For someone without known diabetes, a hemoglobin A1c ?value of 6.5% or greater indicates that they may have  ?diabetes and this should be confirmed with a follow-up  ?test. ?. ?For  someone with known diabetes, a value <7% indicates  ?that their diabetes is well controlled and a value  ?greater than or equal to 7% indicates suboptimal  ?control. A1c targets should be individualized based on  ?duration of diabetes, age, comorbid conditions, and  ?other considerations. ?. ?Currently, no consensus exists regarding use of ?hemoglobin A1c for diagnosis of diabetes for children. ?. ?  ?  ?  ?  ?  Passed - Valid encounter within last 6 months  ?  Recent Outpatient Visits   ? ?      ? 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 [facility] Pickard, Cammie Mcgee, MD  ? 7 months ago Fever, unspecified fever cause  ? [place], Jessica A, NP  ? 9 months ago Type 2 diabetes mellitus with complications ([place])  ? [facility] Pickard, Cammie Mcgee, MD  ? 1 year ago Type 2 diabetes mellitus with complications [facility])  ? [facility] Pickard, Cammie Mcgee, MD  ? 1 year ago Type 2 diabetes mellitus with complications [facility])  ?  [facility] Pickard, Cammie Mcgee, MD  ? ?  ?  ?Future Appointments   ? ?        ? In 2 months Pickard, Cammie Mcgee, MD [place], [facility]  ? ?  ? ? ?  ?  ?  ? metFORMIN (GLUCOPHAGE) 1000 MG tablet 180 tablet 1  ?  Sig: TAKE 1 TABLET TWICE DAILY WITH MEALS  ?  ? Endocrinology:  Diabetes - Biguanides Failed - 01/07/2022  1:05 PM  ?  ?  Failed - B12 Level in normal range and within 720 days  ?  No results found for: VITAMINB12  ?  ?  ?  Passed - Cr in normal range and within 360 days  ?  Creat  ?Date Value Ref Range Status  ?09/19/2021 0.99 0.50 - 1.05 mg/dL Final  ?  ?  ?  ?  Passed - HBA1C is between 0 and 7.9 and within 180 days  ?  Hgb A1c MFr Bld  ?Date Value Ref Range Status  ?09/19/2021 6.8 (H) <5.7 % of total Hgb Final  ?  Comment:  ?  For someone without known diabetes, a hemoglobin A1c ?value of 6.5% or greater indicates that they may have  ?diabetes and this should be  confirmed with a follow-up  ?test. ?. ?For someone with known diabetes, a value <7% indicates  ?that their diabetes is well controlled and a value  ?greater than or equal to 7% indicates suboptimal  ?control. A1c targets should be individualized based on  ?duration of diabetes, age, comorbid conditions, and  ?other considerations. ?. ?Currently, no consensus exists regarding use of ?hemoglobin A1c for diagnosis of diabetes for children. ?. ?  ?  ?  ?  ?  Passed - eGFR in normal range and within 360 days  ?  GFR, Est African American  ?Date Value Ref Range Status  ?09/25/2020 69 > OR = 60 mL/min/1.31m Final  ? ?GFR, Est Non African American  ?Date Value Ref Range Status  ?09/25/2020 59 (L) > OR = 60 mL/min/1.776mFinal  ? ?eGFR  ?Date Value Ref Range Status  ?03/20/2021 67 > OR = 60 mL/min/1.7379minal  ?  Comment:  ?  The eGFR is based on the CKD-EPI 2021 equation. To calculate  ?the new eGFR from a previous Creatinine or Cystatin C ?result, go to https://www.kidney.org/professionals/ ?kdoqi/gfr%5Fcalculator ?  ?  ?  ?  ?  Passed - Valid encounter within last 6 months  ?  Recent Outpatient Visits   ? ?      ? 3 months ago Type 2 diabetes mellitus with complications ([place]CC[place]? [facility] Pickard, WarCammie McgeeD  ? 7 months ago Fever, unspecified fever cause  ? BroDamonessica A, NP  ? 9 months ago Type 2 diabetes mellitus with complications ([place]? [facility] Pickard, WarCammie McgeeD  ? 1 year [facility], WarCammie McgeeD  ? 1 year ago [facility] Pickard, WarCammie McgeeD  ? ?  ?  ?Future Appointments   ? ?        ? In 2 months Pickard, WarCammie McgeeD BroCameronEC  ? ?  ? ? ?  ?  ?  Passed - CBC within normal limits  and completed in the last 12 months  ?  WBC  ?Date Value Ref Range Status   ?09/19/2021 9.8 3.8 - 10.8 Thousand/uL Final  ? ?RBC  ?Date Value Ref Range Status  ?09/19/2021 4.58 3.80 - 5.10 Million/uL Final  ? ?Hemoglobin  ?Date Value Ref Range Status  ?09/19/2021 14.1 11.7 - 15.5 g/dL Final  ? ?HCT  ?Date Value Ref Range Status  ?09/19/2021 41.6 35.0 - 45.0 % Final  ? ?MCHC  ?Date Value Ref Range Status  ?09/19/2021 33.9 32.0 - 36.0 g/dL Final  ? ?MCH  ?Date Value Ref Range Status  ?09/19/2021 30.8 27.0 - 33.0 pg Final  ? ?MCV  ?Date Value Ref Range Status  ?09/19/2021 90.8 80.0 - 100.0 fL Final  ? ?No results found for: PLTCOUNTKUC, LABPLAT, [place] ?RDW  ?Date Value Ref Range Status  ?09/19/2021 13.2 11.0 - 15.0 % Final  ? ?  ?  ?  ? ? ? ?Requested Prescriptions  ?Pending Prescriptions Disp Refills  ? Alcohol Swabs (DROPSAFE ALCOHOL PREP) 70 % PADS [Pharmacy Med Name: DROPSAFE ALCOHOL PREP PADS 70 % Pad] 300 each 1  ?  Sig: USE THREE TIMES DAILY  ?  ? Off-Protocol Failed - 01/07/2022  1:05 PM  ?  ?  Failed - Medication not assigned to a protocol, review manually.  ?  ?  Passed - Valid encounter within last 12 months  ?  Recent Outpatient Visits   ? ?      ? 3 months ago Type 2 diabetes mellitus with complications ([place])  ? [facility] Pickard, Cammie Mcgee, MD  ? 7 months ago Fever, unspecified fever cause  ? [place], Jessica A, NP  ? 9 months ago Type 2 diabetes mellitus with complications ([place])  ? [facility] Pickard, Cammie Mcgee, MD  ? 1 year ago Type 2 diabetes mellitus with complications [facility])  ? [facility] Pickard, Cammie Mcgee, MD  ? 1 year ago Type 2 diabetes mellitus with complications [facility]  ? [facility] Pickard, Cammie Mcgee, MD  ? ?  ?  ?Future Appointments   ? ?        ? In 2 months Pickard, Cammie Mcgee, MD [place], [facility]  ? ?  ? ? ?  ?  ?  ?Signed Prescriptions Disp Refills  ? TRULICITY 1.5 BT/5.1VO SOPN 12 mL 1  ?  Sig: INJECT 1.5MG (1 PEN) SUBCUTANEOUSLY EVERY WEEK AS DIRECTED   ?  ? Endocrinology:  Diabetes - GLP-1 Receptor Agonists Passed - 01/07/2022  1:05 PM  ?  ?  Passed - HBA1C is between 0 and 7.9 and within 180 days  ?  Hgb A1c MFr Bld  ?Date Value Ref Range Status  ?09/19/2021 6.8

## 2022-01-08 NOTE — Telephone Encounter (Signed)
Requested Prescriptions  ?Pending Prescriptions Disp Refills  ?? Alcohol Swabs (DROPSAFE ALCOHOL PREP) 70 % PADS [Pharmacy Med Name: DROPSAFE ALCOHOL PREP PADS 70 % Pad] 300 each 1  ?  Sig: USE THREE TIMES DAILY  ?  ? Off-Protocol Failed - 01/07/2022  1:05 PM  ?  ?  Failed - Medication not assigned to a protocol, review manually.  ?  ?  Passed - Valid encounter within last 12 months  ?  Recent Outpatient Visits   ?      ? 3 months ago Type 2 diabetes mellitus with complications ([place])  ? [facility] Pickard, Cammie Mcgee, MD  ? 7 months ago Fever, unspecified fever cause  ? [place], Jessica A, NP  ? 9 months ago Type 2 diabetes mellitus with complications ([place])  ? [facility] Pickard, Cammie Mcgee, MD  ? 1 year ago Type 2 diabetes mellitus with complications [facility])  ? [facility] Pickard, Cammie Mcgee, MD  ? 1 year ago Type 2 diabetes mellitus with complications [facility])  ? [facility] Pickard, Cammie Mcgee, MD  ?  ?  ?Future Appointments   ?        ? In 2 months Pickard, Cammie Mcgee, MD [place], [facility]  ?  ? ?  ?  ?  ?? TRULICITY 1.5 JO/8.7OM SOPN [Pharmacy Med Name: TRULICITY 1.5 VE/7.2CN Solution Pen-injector] 12 mL 1  ?  Sig: INJECT 1.5MG (1 PEN) SUBCUTANEOUSLY EVERY WEEK AS DIRECTED  ?  ? Endocrinology:  Diabetes - GLP-1 Receptor Agonists Passed - 01/07/2022  1:05 PM  ?  ?  Passed - HBA1C is between 0 and 7.9 and within 180 days  ?  Hgb A1c MFr Bld  ?Date Value Ref Range Status  ?09/19/2021 6.8 (H) <5.7 % of total Hgb Final  ?  Comment:  ?  For someone without known diabetes, a hemoglobin A1c ?value of 6.5% or greater indicates that they may have  ?diabetes and this should be confirmed with a follow-up  ?test. ?. ?For someone with known diabetes, a value <7% indicates  ?that their diabetes is well controlled and a value  ?greater than or equal to 7% indicates suboptimal  ?control. A1c targets should be individualized based  on  ?duration of diabetes, age, comorbid conditions, and  ?other considerations. ?. ?Currently, no consensus exists regarding use of ?hemoglobin A1c for diagnosis of diabetes for children. ?. ?  ?   ?  ?  Passed - Valid encounter within last 6 months  ?  Recent Outpatient Visits   ?      ? 3 months ago Type 2 diabetes mellitus with complications ([place])  ? [facility] Pickard, Cammie Mcgee, MD  ? 7 months ago Fever, unspecified fever cause  ? [place], Jessica A, NP  ? 9 months ago Type 2 diabetes mellitus with complications ([place])  ? [facility] Pickard, Cammie Mcgee, MD  ? 1 year ago Type 2 diabetes mellitus with complications [facility])  ? [facility] Pickard, Cammie Mcgee, MD  ? 1 year ago Type 2 diabetes mellitus with complications [facility])  ? [facility] Pickard, Cammie Mcgee, MD  ?  ?  ?Future Appointments   ?        ? In 2 months Pickard, Cammie Mcgee, MD [place], [facility]  ?  ? ?  ?  ?  ??  metFORMIN (GLUCOPHAGE) 1000 MG tablet [Pharmacy Med Name: METFORMIN HYDROCHLORIDE 1000 MG Tablet] 180 tablet 1  ?  Sig: TAKE 1 TABLET TWICE DAILY WITH MEALS  ?  ? Endocrinology:  Diabetes - Biguanides Failed - 01/07/2022  1:05 PM  ?  ?  Failed - B12 Level in normal range and within 720 days  ?  No results found for: VITAMINB12   ?  ?  Passed - Cr in normal range and within 360 days  ?  Creat  ?Date Value Ref Range Status  ?09/19/2021 0.99 0.50 - 1.05 mg/dL Final  ?   ?  ?  Passed - HBA1C is between 0 and 7.9 and within 180 days  ?  Hgb A1c MFr Bld  ?Date Value Ref Range Status  ?09/19/2021 6.8 (H) <5.7 % of total Hgb Final  ?  Comment:  ?  For someone without known diabetes, a hemoglobin A1c ?value of 6.5% or greater indicates that they may have  ?diabetes and this should be confirmed with a follow-up  ?test. ?. ?For someone with known diabetes, a value <7% indicates  ?that their diabetes is well controlled and a value  ?greater than or equal  to 7% indicates suboptimal  ?control. A1c targets should be individualized based on  ?duration of diabetes, age, comorbid conditions, and  ?other considerations. ?. ?Currently, no consensus exists regarding use of ?hemoglobin A1c for diagnosis of diabetes for children. ?. ?  ?   ?  ?  Passed - eGFR in normal range and within 360 days  ?  GFR, Est African American  ?Date Value Ref Range Status  ?09/25/2020 69 > OR = 60 mL/min/1.73m Final  ? ?GFR, Est Non African American  ?Date Value Ref Range Status  ?09/25/2020 59 (L) > OR = 60 mL/min/1.768mFinal  ? ?eGFR  ?Date Value Ref Range Status  ?03/20/2021 67 > OR = 60 mL/min/1.7370minal  ?  Comment:  ?  The eGFR is based on the CKD-EPI 2021 equation. To calculate  ?the new eGFR from a previous Creatinine or Cystatin C ?result, go to https://www.kidney.org/professionals/ ?kdoqi/gfr%5Fcalculator ?  ?   ?  ?  Passed - Valid encounter within last 6 months  ?  Recent Outpatient Visits   ?      ? 3 months ago Type 2 diabetes mellitus with [facility] ([facility]? [facility] Pick[facility], WarCammie McgeeD  ? 7 months ago Fever, unspecified fever cause  ? BroSedgewickvilleessica A, NP  ? 9 months ago Type 2 diabetes mellitus with complications ([place]? [facility] Pickard, WarCammie McgeeD  ? 1 year ago Type [facility] with [facility] Pickard, WarCammie McgeeD  ? 1 year ago Type 2 diabetes mellitus with complications ([facility]? [facility] Pickard, WarCammie McgeeD  ?  ?  ?Future Appointments   ?        ? In 2 months Pickard, WarCammie McgeeD BroNew ProvidenceEC  ?  ? ?  ?  ?  Passed - CBC within normal limits and completed in the last 12 months  ?  WBC  ?Date Value Ref Range Status  ?09/19/2021 9.8 3.8 - 10.8 Thousand/uL Final  ? ?RBC  ?Date Value Ref Range Status  ?09/19/2021 4.58 3.80 - 5.10 Million/uL Final  ? ?Hemoglobin  ?Date Value Ref Range Status  ?09/19/2021  14.1 11.7 - 15.5 g/dL Final  ? ?HCT  ?Date Value Ref Range Status  ?09/19/2021 41.6 35.0 - 45.0 % Final  ? ?MCHC  ?Date Value Ref Range Status  ?09/19/2021 33.9 32.0 - 36.0 g/dL Final  ? ?MCH  ?Date Value Ref Range Status  ?09/19/2021 30.8 27.0 - 33.0 pg Final  ? ?MCV  ?Date Value Ref Range Status  ?09/19/2021 90.8 80.0 - 100.0 fL Final  ? ?No results found for: PLTCOUNTKUC, LABPLAT, [place] ?RDW  ?Date Value Ref Range Status  ?09/19/2021 13.2 11.0 - 15.0 % Final  ? ?  ?  ?  ? ?

## 2022-01-09 NOTE — Telephone Encounter (Signed)
Requested Prescriptions  ?Pending Prescriptions Disp Refills  ?? potassium chloride (KLOR-CON M) 10 MEQ tablet [Pharmacy Med Name: POTASSIUM CHLORIDE ER 10 MEQ Tablet Extended Release] 90 tablet 1  ?  Sig: TAKE 1 TABLET EVERY DAY  ?  ? Endocrinology:  Minerals - Potassium Supplementation Passed - 01/08/2022  2:26 AM  ?  ?  Passed - K in normal range and within 360 days  ?  Potassium  ?Date Value Ref Range Status  ?09/19/2021 4.3 3.5 - 5.3 mmol/L Final  ?   ?  ?  Passed - Cr in normal range and within 360 days  ?  Creat  ?Date Value Ref Range Status  ?09/19/2021 0.99 0.50 - 1.05 mg/dL Final  ?   ?  ?  Passed - Valid encounter within last 12 months  ?  Recent Outpatient Visits   ?      ? 3 months ago Type 2 diabetes mellitus with complications (HCC)  ? [facility] Pickard, Priscille Heidelberg, MD  ? 7 months ago Fever, unspecified fever cause  ? [facility] Medicine Cathlean Marseilles A, NP  ? 9 months ago Type 2 diabetes mellitus with complications (HCC)  ? [facility] Pickard, Priscille Heidelberg, MD  ? 1 year ago Type 2 diabetes mellitus with complications [facility])  ? [facility] Medicine Pickard, Priscille Heidelberg, MD  ? 1 year ago Type 2 diabetes mellitus with complications [facility])  ? [facility] Family Medicine Pickard, Priscille Heidelberg, MD  ?  ?  ?Future Appointments   ?        ? In 2 months Pickard, Priscille Heidelberg, MD [facility], [facility]  ?  ? ?  ?  ?  ? ?

## 2022-01-18 ENCOUNTER — Other Ambulatory Visit: Payer: Self-pay | Admitting: Family Medicine

## 2022-01-18 ENCOUNTER — Telehealth: Payer: Self-pay

## 2022-01-18 MED ORDER — PREDNISONE 20 MG PO TABS: ORAL_TABLET | ORAL | 0 refills | Status: DC

## 2022-01-18 NOTE — Telephone Encounter (Signed)
Requested medication (s) are due for refill today: duplicate signed today   Requested medication (s) are on the active medication list: yes  Last refill:  01/18/22 #12 0 refills  Future visit scheduled: yes in 2 months  Notes to clinic:  not delegated per protocol. Signed today and receipt confirmed by pharmacy 01/18/22 at 2:07 pm.      Requested Prescriptions  Pending Prescriptions Disp Refills   predniSONE (DELTASONE) 20 MG tablet [Pharmacy Med Name: predniSONE 20 MG Oral Tablet] 12 tablet 0    Sig: TAKE 3 TABLETS ON DAY 1-2, THEN TAKE 2 TABLETS ON DAY 3-4, THEN TAKE 1 TABLETS ON DAY 5-6. TAKE ALL DOSES WITH FOOD.     Not Delegated - Endocrinology:  Oral Corticosteroids Failed - 01/18/2022  2:08 PM      Failed - This refill cannot be delegated      Failed - Manual Review: Eye exam for IOP if prolonged treatment      Failed - Glucose (serum) in normal range and within 180 days    Glucose, Bld  Date Value Ref Range Status  09/19/2021 155 (H) 65 - 99 mg/dL Final    Comment:    .            Fasting reference interval . For someone without known diabetes, a glucose value >125 mg/dL indicates that they may have diabetes and this should be confirmed with a follow-up test. .    Glucose-Capillary  Date Value Ref Range Status  04/28/2018 160 (H) 70 - 99 mg/dL Final         Failed - Bone Mineral Density or Dexa Scan completed in the last 2 years      Passed - K in normal range and within 180 days    Potassium  Date Value Ref Range Status  09/19/2021 4.3 3.5 - 5.3 mmol/L Final         Passed - Na in normal range and within 180 days    Sodium  Date Value Ref Range Status  09/19/2021 141 135 - 146 mmol/L Final         Passed - Last BP in normal range    BP Readings from Last 1 Encounters:  09/24/21 (!) 108/58         Passed - Valid encounter within last 6 months    Recent Outpatient Visits           3 months ago Type 2 diabetes mellitus with complications (HCC)    [facility] Medicine Pickard, Priscille Heidelberg, MD   8 months ago Fever, unspecified fever cause   [facility] Cathlean Marseilles A, NP   9 months ago Type 2 diabetes mellitus with complications (HCC)   [facility] Medicine Donita Brooks, MD   1 year ago Type 2 diabetes mellitus with complications (HCC)   [facility] Medicine Donita Brooks, MD   1 year ago Type 2 diabetes mellitus with complications (HCC)   [facility] Family Medicine Pickard, Priscille Heidelberg, MD       Future Appointments             In 2 months Pickard, Priscille Heidelberg, MD [facility] Medicine, [facility]

## 2022-01-18 NOTE — Telephone Encounter (Signed)
Pt called in stating that she is having some severe back pain and is not able to come into to office to see pcp. Pt states that she has spoke with pcp in depth about her issues with her back. Pt states that per pcp she needed to call into office to request that a prednisone treatment be sent in for pt. Please advise.  Cb#: [phone]

## 2022-01-21 NOTE — Telephone Encounter (Addendum)
Rx sent per Dr Tanya Nones, tried to call pt to let them know, however VM is full, can't LM

## 2022-01-29 ENCOUNTER — Other Ambulatory Visit: Payer: Self-pay | Admitting: Family Medicine

## 2022-01-30 NOTE — Telephone Encounter (Signed)
Requested Prescriptions  Pending Prescriptions Disp Refills  . Accu-Chek Softclix Lancets lancets [Pharmacy Med Name: ACCU-CHEK SOFTCLIX LANCETS] 300 each 1    Sig: TEST BLOOD SUGAR THREE TIMES DAILY     Endocrinology: Diabetes - Testing Supplies Passed - 01/29/2022  4:28 PM      Passed - Valid encounter within last 12 months    Recent Outpatient Visits          4 months ago Type 2 diabetes mellitus with complications (HCC)   [facility] Medicine Pickard, Priscille Heidelberg, MD   8 months ago Fever, unspecified fever cause   [facility] Valentino Nose, NP   10 months ago Type 2 diabetes mellitus with complications (HCC)   [facility] Medicine Donita Brooks, MD   1 year ago Type 2 diabetes mellitus with complications (HCC)   [facility] Medicine Donita Brooks, MD   1 year ago Type 2 diabetes mellitus with complications (HCC)   [facility] Pickard, Priscille Heidelberg, MD      Future Appointments            In 1 month Pickard, Priscille Heidelberg, MD [facility], [facility]

## 2022-02-06 NOTE — Progress Notes (Addendum)
[facility] Note  02/19/2022     CHIEF COMPLAINT Patient presents for Retina Follow Up   HISTORY OF PRESENT ILLNESS: Linda Briggs is a 66 y.o. female who presents to the clinic today for:   HPI     Retina Follow Up   Patient presents with  Other.  In both eyes.  Severity is moderate.  Duration of 2 months.  Since onset it is stable.  I, the attending physician,  performed the HPI with the patient and updated documentation appropriately.        Comments   Pt here for 2 mo ret f/u for lattice degen OU, here for possible prophylactic laser OS.       Last edited by Bernarda Caffey, MD on 02/19/2022  5:29 PM.     Referring physician: Susy Frizzle, MD [address] [place],  [place] [address]  HISTORICAL INFORMATION:   Selected notes from the MEDICAL RECORD NUMBER Referred by Dr. Lucianne Lei for retinal eval LEE:  Ocular Hx- PMH-    CURRENT MEDICATIONS: Current Outpatient Medications (Ophthalmic Drugs)  Medication Sig   prednisoLONE acetate (PRED FORTE) 1 % ophthalmic suspension Place 1 drop into the right eye 4 (four) times daily for 7 days.   No current facility-administered medications for this visit. (Ophthalmic Drugs)   Current Outpatient Medications (Other)  Medication Sig   Accu-Chek Softclix Lancets lancets TEST BLOOD SUGAR THREE TIMES DAILY   Alcohol Swabs (DROPSAFE ALCOHOL PREP) 70 % PADS USE THREE TIMES DAILY   aspirin EC 81 MG tablet Take 81 mg by mouth daily.   Blood Glucose Calibration (TRUE METRIX LEVEL 2) Normal SOLN Use as directed to monitor FSBS 3x daily. Dx: E11.65   Blood Glucose Monitoring Suppl (TRUE METRIX AIR GLUCOSE METER) DEVI 1 each by Does not apply route daily. Use as directed to monitor FSBS 3x daily. Dx: E11.65   FARXIGA 10 MG TABS tablet TAKE 1 TABLET EVERY DAY BEFORE BREAKFAST   furosemide (LASIX) 20 MG tablet TAKE 1 TABLET EVERY DAY   gabapentin (NEURONTIN) 300 MG capsule TAKE 1 CAPSULE THREE TIMES  DAILY   glucose blood (ACCU-CHEK GUIDE) test strip TEST BLOOD SUGAR THREE TIMES DAILY.   lisinopril (ZESTRIL) 5 MG tablet TAKE 1 TABLET (5 MG TOTAL) BY MOUTH DAILY.   metFORMIN (GLUCOPHAGE) 1000 MG tablet TAKE 1 TABLET TWICE DAILY WITH MEALS   metoprolol tartrate (LOPRESSOR) 50 MG tablet TAKE 1/2 TABLET TWICE DAILY   Multiple Vitamin (MULTIVITAMIN) tablet Take 1 tablet by mouth daily.   ondansetron (ZOFRAN) 4 MG tablet TAKE 1 TABLET (4 MG TOTAL) BY MOUTH EVERY 8 (EIGHT) HOURS AS NEEDED FOR NAUSEA OR VOMITING.   pantoprazole (PROTONIX) 40 MG tablet TAKE 1 TABLET (40 MG TOTAL) BY MOUTH 2 (TWO) TIMES DAILY BEFORE  MEALS   potassium chloride (KLOR-CON M) 10 MEQ tablet TAKE 1 TABLET EVERY DAY   pravastatin (PRAVACHOL) 40 MG tablet TAKE 1 TABLET EVERY DAY   predniSONE (DELTASONE) 20 MG tablet 3 tabs poqday 1-2, 2 tabs poqday 3-4, 1 tab poqday 5-6   rosuvastatin (CRESTOR) 20 MG tablet Take 1 tablet (20 mg total) by mouth daily. Stop pravastatin   traMADol (ULTRAM) 50 MG tablet TAKE 1 TABLET BY MOUTH EVERY 6 HOURS AS NEEDED FOR  CHRONIC  BACK  PAIN   TRULICITY 1.5 IN/8.6VE SOPN INJECT 1.$RemoveBefor'5MG'OETwdcnVKKRx$  (1 PEN) SUBCUTANEOUSLY EVERY WEEK AS DIRECTED   No current facility-administered medications for this visit. (Other)  REVIEW OF SYSTEMS: ROS   Positive for: Endocrine, Eyes Negative for: Constitutional, Gastrointestinal, Neurological, Skin, Genitourinary, Musculoskeletal, HENT, Cardiovascular, Respiratory, Psychiatric, Allergic/Imm, Heme/Lymph Last edited by Kingsley Spittle, COT on 02/19/2022  1:34 PM.     ALLERGIES Allergies  Allergen Reactions   Codeine Anaphylaxis and Hives   Penicillins Anaphylaxis, Hives and Other (See Comments)    Has patient had a PCN reaction causing immediate rash, facial/tongue/throat swelling, SOB or lightheadedness with hypotension: No Has patient had a PCN reaction causing severe rash involving mucus membranes or skin necrosis: No Has patient had a PCN reaction that  required hospitalization: No Has patient had a PCN reaction occurring within the last 10 years: No If all of the above answers are "NO", then may proceed with Cephalosporin use.    Ciprofloxacin Other (See Comments)    Tongue Swelling   Food Nausea And Vomiting and Other (See Comments)    Olives    Januvia [Sitagliptin] Other (See Comments)    Stomach cramps   Latex Hives   PAST MEDICAL HISTORY Past Medical History:  Diagnosis Date   Arthritis    feet & toes   Bulging disc    Diabetes mellitus    Gastroparesis    History of hepatitis B    Hyperlipidemia    Hypertension    Neuromuscular disorder ([place])    in feet and fingers    Neuropathy    feet   Nonproliferative diabetic retinopathy (HCC)    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    Peripheral vascular disease (HCC)    Shortness of breath dyspnea    on lopressor    Sleep apnea    pt had study done but it was incomplete due to blood sugar issues    Past Surgical History:  Procedure Laterality Date   BIOPSY  04/28/2018   Procedure: BIOPSY;  Surgeon: Ladene Artist, MD;  Location: [facility];  Service: Endoscopy;;   BIOPSY THYROID     january 2016   ESOPHAGOGASTRODUODENOSCOPY (EGD) WITH PROPOFOL N/A 04/28/2018   Procedure: ESOPHAGOGASTRODUODENOSCOPY (EGD) WITH PROPOFOL;  Surgeon: Ladene Artist, MD;  Location: [facility];  Service: Endoscopy;  Laterality: N/A;   HYSTEROSCOPY WITH D & C N/A 02/15/2015   Procedure: DILATATION AND CURETTAGE /HYSTEROSCOPY;  Surgeon: Linda Hedges, DO;  Location: [facility];  Service: Gynecology;  Laterality: N/A;   TONSILLECTOMY     FAMILY HISTORY Family History  Problem Relation Age of Onset   Hyperlipidemia Mother    Heart disease Father    Hyperlipidemia Father    Hypertension Father    Heart disease Brother    Kidney disease Sister    Kidney cancer Sister    Ovarian cancer Maternal Aunt    Kidney disease Maternal Aunt        x 2   Kidney disease Maternal Uncle        x 2    Stroke Brother    Heart disease Brother    Colon cancer Neg Hx    Stomach cancer Neg Hx    SOCIAL HISTORY Social History   Tobacco Use   Smoking status: Never   Smokeless tobacco: Never  Vaping Use   Vaping Use: Never used  Substance Use Topics   Alcohol use: No    Comment: social drinker.   Drug use: No       OPHTHALMIC EXAM:  Base Eye Exam     Visual Acuity (Snellen - Linear)  Right Left   Dist [place] 20/30 +2 20/25   Dist ph [place] 20/20 -1 20/20         Tonometry (Tonopen, 1:46 PM)       Right Left   Pressure 17 15         Pupils       Dark Light Shape React APD   Right 2 1 Round Brisk None   Left 3 2 Round Brisk None         Visual Fields (Counting fingers)       Left Right    Full Full         Extraocular Movement       Right Left    Full, Ortho Full, Ortho         Neuro/Psych     Oriented x3: Yes   Mood/Affect: Normal         Dilation     Both eyes: 1.0% Mydriacyl, 2.5% Phenylephrine @ 1:47 PM           Slit Lamp and Fundus Exam     Slit Lamp Exam       Right Left   Lids/Lashes Dermatochalasis - upper lid Dermatochalasis - upper lid   Conjunctiva/Sclera temporal pinguecula temporal pinguecula   Cornea Trace Punctate epithelial erosions trace PEE   Anterior Chamber Deep and quiet Deep and quiet   Iris Round and dilated, No NVI Round and dilated, No NVI   Lens 2+ Nuclear sclerosis, 2+ Cortical cataract 2+ Nuclear sclerosis, 2+ Cortical cataract   Anterior Vitreous Vitreous syneresis Vitreous syneresis         Fundus Exam       Right Left   Disc Pink and Sharp Pink and Sharp, +cupping   C/D Ratio 0.6 0.6   Macula Blunted foveal reflex, Drusen, RPE mottling, focal MA nasal to fovea, no edema Blunted foveal reflex, Drusen, RPE mottling and clumping, no heme or edema   Vessels attenuated, Tortuous attenuated, Tortuous, copper wiring   Periphery Attached, scattered MA/DBH, scattered peripheral drusen, mild patches  of pigmented lattice from 1000 to 1100 superiorly, and 0430-0700 inferiorly, elevated choroidal lesion at 1230 equator -- collapses under scleral depresson, pigmented paving stone degeneration inferiorly Attached, mild reticular degeneration, peripheral drusen, mild lattice at 0100, pigmented paving stone degeneration inferiorly, scattered MA / DBH, punctate VR tuft at 1130           IMAGING AND PROCEDURES  Imaging and Procedures for 02/19/2022  OCT, Retina - OU - Both Eyes       Right Eye Quality was good. Central Foveal Thickness: 291. Progression has been stable. Findings include normal foveal contour, no IRF, no SRF, retinal drusen , vitreomacular adhesion (Hyporeflective elevated choroidal lesion superior periphery caught on widefield ).   Left Eye Quality was good. Central Foveal Thickness: 289. Progression has been stable. Findings include normal foveal contour, no IRF, no SRF, retinal drusen , vitreomacular adhesion .   Notes *Images captured and stored on drive  Diagnosis / Impression:  NFP; no IRF/SRF OU +drusen OU OD: Hyporeflective elevated choroidal lesion superior periphery caught on widefield -- stable  Clinical management:  See below  Abbreviations: NFP - Normal foveal profile. CME - cystoid macular edema. PED - pigment epithelial detachment. IRF - intraretinal fluid. SRF - subretinal fluid. EZ - ellipsoid zone. ERM - epiretinal membrane. ORA - outer retinal atrophy. ORT - outer retinal tubulation. SRHM - subretinal hyper-reflective material. IRHM - intraretinal hyper-reflective material  Color Fundus Photography Optos - OU - Both Eyes       Right Eye Progression has no prior data. Disc findings include increased cup to disc ratio. Macula : normal observations. Vessels : normal observations. Periphery : mass, lattice, RPE abnormality.   Left Eye Progression has no prior data.   Notes **Images stored on drive**  Impression: OD: pigmented choroidal  lesion at 1200 equator, pigmented lattice ST periphery, midzonal drusen OS: not imaged today      Repair Retinal Breaks, Laser - OD - Right Eye       LASER PROCEDURE NOTE  Procedure:  Barrier laser retinopexy using slit lamp laser, RIGHT eye   Diagnosis:   Lattice degeneration w/ atrophic holes, RIGHT eye                     Patches of lattice: 1000-1100 superiorly; 0430-0700 inferiorly  Surgeon: Bernarda Caffey, MD, PhD  Anesthesia: Topical  Informed consent obtained, operative eye marked, and time out performed prior to initiation of laser.   Laser settings:  Lumenis [id number] laser, slit lamp Lens: [place] PRP 165 Power: 320 mW Spot size: 200 microns Duration: 30 msec  # spots: 520  Placement of laser: Using a [place] PRP 165 contact lens at the slit lamp, laser was placed in three confluent rows around patches of lattice w/ atrophic holes from 1000-1100 and 0430-0700 oclock anterior to equator  Complications: None.  Patient tolerated the procedure well and received written and verbal post-procedure care information/education.            ASSESSMENT/PLAN:    ICD-10-CM   1. 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 Choroidal lesion  H31.8 Color Fundus Photography Optos - OU - Both Eyes    3. Bilateral retinal lattice degeneration  H35.413 Repair Retinal Breaks, Laser - OD - Right Eye    4. Bilateral retinal defect  H33.303 Repair Retinal Breaks, Laser - OD - Right Eye    5. Essential hypertension  I10     6. Hypertensive retinopathy of both eyes  H35.033     7. Diabetes mellitus type 2 without retinopathy ([place])  E11.9     8. Combined forms of age-related cataract of both eyes  H25.813       Age related macular degeneration, non-exudative, both eyes - early stage OU  - The incidence, anatomy, and pathology of dry AMD, risk of progression, and the AREDS and AREDS 2 study  including smoking risks discussed with patient.  - Recommend amsler grid monitoring  - f/u 3 months, DFE, OCT  2. Choroidal lesion OD  - elevated choroidal lesion 1230 equator  - OCT shows hyporeflective elevated choroidal lesion superior periphery caught on widefield -- stable   - scleral depressed exam exhibits collapse of lesion on depression -- consistent with vortex vein varix  - discussed findings, prognosis  - no retinal or ophthalmic interventions indicated or recommended   3,4. Lattice degeneration OU - OD: mild patches of pigmented lattice from 1000-1100 superiorly and 0430-0700 inferiorly - OS: mild lattice at 0100, VR tuft at 1030 - discussed findings, prognosis, and treatment options including observation - recommend laser retinopexy OD today, 06.20.23 - pt wishes to proceed with laser - RBA of procedure discussed, questions answered - informed consent obtained and signed - see procedure note - start PF QID OD x7 days - f/u in 2-3 weeks,  sooner prn -- possible prophylactic laser OS  5,6. Hypertensive retinopathy OU - discussed importance of tight BP control - monitor  7. Diabetes mellitus, type 2 without retinopathy - The incidence, risk factors for progression, natural history and treatment options for diabetic retinopathy  were discussed with patient.   - The need for close monitoring of blood glucose, blood pressure, and serum lipids, avoiding cigarette or any type of tobacco, and the need for long term follow up was also discussed with patient. - f/u in 1 year, sooner prn  8. Mixed Cataract OU - The symptoms of cataract, surgical options, and treatments and risks were discussed with patient. - discussed diagnosis and progression - under the expert management of Dr. Lucianne Lei  Ophthalmic Meds Ordered this visit:  Meds ordered this encounter  Medications   prednisoLONE acetate (PRED FORTE) 1 % ophthalmic suspension    Sig: Place 1 drop into the right eye 4 (four)  times daily for 7 days.    Dispense:  10 mL    Refill:  0     Return for f/u 2-3 weeks lattice OU, DFE, OCT, laser retinopexy OS.  There are no Patient Instructions on file for this visit.   Explained the diagnoses, plan, and follow up with the patient and they expressed understanding.  Patient expressed understanding of the importance of proper follow up care.   This document serves as a record of services personally performed by Gardiner Sleeper, MD, PhD. It was created on their behalf by Renaldo Reel, [place] an ophthalmic technician. The creation of this record is the provider's dictation and/or activities during the visit.    Electronically signed by:  Renaldo Reel, COT 02/06/22 5:37 PM   This document serves as a record of services personally performed by Gardiner Sleeper, MD, PhD. It was created on their behalf by San Jetty. Owens Shark, OA an ophthalmic technician. The creation of this record is the provider's dictation and/or activities during the visit.    Electronically signed by: San Jetty. Marguerita Merles 06.20.2023 5:37 PM  Gardiner Sleeper, M.D., Ph.D. Diseases & Surgery of the Retina and Vitreous [facility]  I have reviewed the above documentation for accuracy and completeness, and I agree with the above. Gardiner Sleeper, M.D., Ph.D. 02/19/22 5:37 PM  Abbreviations: M myopia (nearsighted); A astigmatism; H hyperopia (farsighted); P presbyopia; Mrx spectacle prescription;  CTL contact lenses; OD right eye; OS left eye; OU both eyes  XT exotropia; ET esotropia; PEK punctate epithelial keratitis; PEE punctate epithelial erosions; DES dry eye syndrome; MGD meibomian gland dysfunction; ATs artificial tears; PFAT's preservative free artificial tears; [place] nuclear sclerotic cataract; PSC posterior subcapsular cataract; ERM epi-retinal membrane; PVD posterior vitreous detachment; RD retinal detachment; DM diabetes mellitus; DR diabetic retinopathy; NPDR  non-proliferative diabetic retinopathy; PDR proliferative diabetic retinopathy; CSME clinically significant macular edema; DME diabetic macular edema; dbh dot blot hemorrhages; CWS cotton wool spot; POAG primary open angle glaucoma; C/D cup-to-disc ratio; HVF humphrey visual field; GVF goldmann visual field; OCT optical coherence tomography; IOP intraocular pressure; BRVO Branch retinal vein occlusion; CRVO central retinal vein occlusion; CRAO central retinal artery occlusion; BRAO branch retinal artery occlusion; RT retinal tear; SB scleral buckle; PPV pars plana vitrectomy; VH Vitreous hemorrhage; PRP panretinal laser photocoagulation; IVK intravitreal kenalog; VMT vitreomacular traction; MH Macular hole;  NVD neovascularization of the disc; NVE neovascularization elsewhere; AREDS age related eye disease study; ARMD age related macular degeneration; POAG primary open angle glaucoma; EBMD epithelial/anterior  basement membrane dystrophy; ACIOL anterior chamber intraocular lens; IOL intraocular lens; PCIOL posterior chamber intraocular lens; Phaco/IOL phacoemulsification with intraocular lens placement; [place] photorefractive keratectomy; LASIK laser assisted in situ keratomileusis; HTN hypertension; DM diabetes mellitus; COPD chronic obstructive pulmonary disease

## 2022-02-19 ENCOUNTER — Ambulatory Visit (INDEPENDENT_AMBULATORY_CARE_PROVIDER_SITE_OTHER): Payer: Medicare HMO | Admitting: Ophthalmology

## 2022-02-19 ENCOUNTER — Encounter (INDEPENDENT_AMBULATORY_CARE_PROVIDER_SITE_OTHER): Payer: Self-pay | Admitting: Ophthalmology

## 2022-02-19 DIAGNOSIS — H35033 Hypertensive retinopathy, bilateral: Secondary | ICD-10-CM

## 2022-02-19 DIAGNOSIS — H35413 Lattice degeneration of retina, bilateral: Secondary | ICD-10-CM

## 2022-02-19 DIAGNOSIS — H318 Other specified disorders of choroid: Secondary | ICD-10-CM

## 2022-02-19 DIAGNOSIS — E119 Type 2 diabetes mellitus without complications: Secondary | ICD-10-CM

## 2022-02-19 DIAGNOSIS — H25813 Combined forms of age-related cataract, bilateral: Secondary | ICD-10-CM | POA: Diagnosis not present

## 2022-02-19 DIAGNOSIS — H33303 Unspecified retinal break, bilateral: Secondary | ICD-10-CM

## 2022-02-19 DIAGNOSIS — I10 Essential (primary) hypertension: Secondary | ICD-10-CM

## 2022-02-19 DIAGNOSIS — H353131 Nonexudative age-related macular degeneration, bilateral, early dry stage: Secondary | ICD-10-CM | POA: Diagnosis not present

## 2022-02-19 MED ORDER — PREDNISOLONE ACETATE 1 % OP SUSP: 1.0000 [drp] | Freq: Four times a day (QID) | OPHTHALMIC | 0 refills | Status: AC

## 2022-03-08 NOTE — Progress Notes (Signed)
[facility] Note  03/11/2022     CHIEF COMPLAINT Patient presents for Retina Follow Up   HISTORY OF PRESENT ILLNESS: Linda Briggs is a 66 y.o. female who presents to the clinic today for:   HPI     Retina Follow Up           Diagnosis: Dry AMD   Laterality: both eyes   Severity: moderate   Duration: 3 weeks   Course: stable         Comments   Pt here for 3 wk ret f/u for lattice degen OU. Pt states VA is the same, s/p retinopexy OD 02/19/22. Here today for retinopexy OS. Pt states after each administration of gtt in OD she developed headache.       Last edited by Kingsley Spittle, COT on 03/11/2022  2:14 PM.    Pt is here for laser retinopexy OS today  Referring physician: Susy Frizzle, MD [address] [facility] [place],  [place] [address]  HISTORICAL INFORMATION:   Selected notes from the MEDICAL RECORD NUMBER Referred by Dr. Lucianne Lei for retinal eval LEE:  Ocular Hx- PMH-    CURRENT MEDICATIONS: No current outpatient medications on file. (Ophthalmic Drugs)   No current facility-administered medications for this visit. (Ophthalmic Drugs)   Current Outpatient Medications (Other)  Medication Sig   Accu-Chek Softclix Lancets lancets TEST BLOOD SUGAR THREE TIMES DAILY   Alcohol Swabs (DROPSAFE ALCOHOL PREP) 70 % PADS USE THREE TIMES DAILY   aspirin EC 81 MG tablet Take 81 mg by mouth daily.   Blood Glucose Calibration (TRUE METRIX LEVEL 2) Normal SOLN Use as directed to monitor FSBS 3x daily. Dx: E11.65   Blood Glucose Monitoring Suppl (TRUE METRIX AIR GLUCOSE METER) DEVI 1 each by Does not apply route daily. Use as directed to monitor FSBS 3x daily. Dx: E11.65   FARXIGA 10 MG TABS tablet TAKE 1 TABLET EVERY DAY BEFORE BREAKFAST   furosemide (LASIX) 20 MG tablet TAKE 1 TABLET EVERY DAY   gabapentin (NEURONTIN) 300 MG capsule TAKE 1 CAPSULE THREE TIMES DAILY   glucose blood (ACCU-CHEK GUIDE) test strip TEST BLOOD SUGAR THREE  TIMES DAILY.   lisinopril (ZESTRIL) 5 MG tablet TAKE 1 TABLET (5 MG TOTAL) BY MOUTH DAILY.   metFORMIN (GLUCOPHAGE) 1000 MG tablet TAKE 1 TABLET TWICE DAILY WITH MEALS   metoprolol tartrate (LOPRESSOR) 50 MG tablet TAKE 1/2 TABLET TWICE DAILY   Multiple Vitamin (MULTIVITAMIN) tablet Take 1 tablet by mouth daily.   ondansetron (ZOFRAN) 4 MG tablet TAKE 1 TABLET (4 MG TOTAL) BY MOUTH EVERY 8 (EIGHT) HOURS AS NEEDED FOR NAUSEA OR VOMITING.   pantoprazole (PROTONIX) 40 MG tablet TAKE 1 TABLET (40 MG TOTAL) BY MOUTH 2 (TWO) TIMES DAILY BEFORE  MEALS   potassium chloride (KLOR-CON M) 10 MEQ tablet TAKE 1 TABLET EVERY DAY   pravastatin (PRAVACHOL) 40 MG tablet TAKE 1 TABLET EVERY DAY   predniSONE (DELTASONE) 20 MG tablet 3 tabs poqday 1-2, 2 tabs poqday 3-4, 1 tab poqday 5-6   rosuvastatin (CRESTOR) 20 MG tablet Take 1 tablet (20 mg total) by mouth daily. Stop pravastatin   traMADol (ULTRAM) 50 MG tablet TAKE 1 TABLET BY MOUTH EVERY 6 HOURS AS NEEDED FOR  CHRONIC  BACK  PAIN   TRULICITY 1.5 ZH/0.8MV SOPN INJECT 1.5MG (1 PEN) SUBCUTANEOUSLY EVERY WEEK AS DIRECTED   No current facility-administered medications for this visit. (Other)   REVIEW OF SYSTEMS:  ROS   Positive for: Endocrine, Eyes Negative for: Constitutional, Gastrointestinal, Neurological, Skin, Genitourinary, Musculoskeletal, HENT, Cardiovascular, Respiratory, Psychiatric, Allergic/Imm, Heme/Lymph Last edited by Kingsley Spittle, COT on 03/11/2022  2:11 PM.      ALLERGIES Allergies  Allergen Reactions   Codeine Anaphylaxis and Hives   Penicillins Anaphylaxis, Hives and Other (See Comments)    Has patient had a PCN reaction causing immediate rash, facial/tongue/throat swelling, SOB or lightheadedness with hypotension: No Has patient had a PCN reaction causing severe rash involving mucus membranes or skin necrosis: No Has patient had a PCN reaction that required hospitalization: No Has patient had a PCN reaction occurring within  the last 10 years: No If all of the above answers are "NO", then may proceed with Cephalosporin use.    Ciprofloxacin Other (See Comments)    Tongue Swelling   Food Nausea And Vomiting and Other (See Comments)    Olives    Januvia [Sitagliptin] Other (See Comments)    Stomach cramps   Latex Hives   PAST MEDICAL HISTORY Past Medical History:  Diagnosis Date   Arthritis    feet & toes   Bulging disc    Diabetes mellitus    Gastroparesis    History of hepatitis B    Hyperlipidemia    Hypertension    Neuromuscular disorder ([place])    in feet and fingers    Neuropathy    feet   Nonproliferative diabetic retinopathy (HCC)    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    Peripheral vascular disease (HCC)    Shortness of breath dyspnea    on lopressor    Sleep apnea    pt had study done but it was incomplete due to blood sugar issues    Past Surgical History:  Procedure Laterality Date   BIOPSY  04/28/2018   Procedure: BIOPSY;  Surgeon: Ladene Artist, MD;  Location: [facility];  Service: Endoscopy;;   BIOPSY THYROID     january 2016   ESOPHAGOGASTRODUODENOSCOPY (EGD) WITH PROPOFOL N/A 04/28/2018   Procedure: ESOPHAGOGASTRODUODENOSCOPY (EGD) WITH PROPOFOL;  Surgeon: Ladene Artist, MD;  Location: [facility];  Service: Endoscopy;  Laterality: N/A;   HYSTEROSCOPY WITH D & C N/A 02/15/2015   Procedure: DILATATION AND CURETTAGE /HYSTEROSCOPY;  Surgeon: Linda Hedges, DO;  Location: [facility];  Service: Gynecology;  Laterality: N/A;   TONSILLECTOMY     FAMILY HISTORY Family History  Problem Relation Age of Onset   Hyperlipidemia Mother    Heart disease Father    Hyperlipidemia Father    Hypertension Father    Heart disease Brother    Kidney disease Sister    Kidney cancer Sister    Ovarian cancer Maternal Aunt    Kidney disease Maternal Aunt        x 2   Kidney disease Maternal Uncle        x 2   Stroke Brother    Heart disease Brother    Colon cancer Neg Hx    Stomach  cancer Neg Hx    SOCIAL HISTORY Social History   Tobacco Use   Smoking status: Never   Smokeless tobacco: Never  Vaping Use   Vaping Use: Never used  Substance Use Topics   Alcohol use: No    Comment: social drinker.   Drug use: No       OPHTHALMIC EXAM:  Base Eye Exam     Visual Acuity (Snellen - Linear)  Right Left   Dist [place] 20/25 20/20   Dist ph [place] 20/20 -1          Tonometry (Tonopen, 2:16 PM)       Right Left   Pressure 15 14         Pupils       Dark Light Shape React APD   Right 2 1 Round Brisk None   Left 3 2 Round Brisk None         Visual Fields (Counting fingers)       Left Right    Full Full         Extraocular Movement       Right Left    Full, Ortho Full, Ortho         Neuro/Psych     Oriented x3: Yes   Mood/Affect: Normal         Dilation     Both eyes: 1.0% Mydriacyl, 2.5% Phenylephrine @ 2:16 PM           IMAGING AND PROCEDURES  Imaging and Procedures for 03/11/2022          ASSESSMENT/PLAN:    ICD-10-CM   1. 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 Choroidal lesion  H31.8     3. Bilateral retinal lattice degeneration  H35.413     4. Bilateral retinal defect  H33.303     5. Essential hypertension  I10     6. Hypertensive retinopathy of both eyes  H35.033     7. Diabetes mellitus type 2 without retinopathy ([place])  E11.9     8. Combined forms of age-related cataract of both eyes  H25.813      Age related macular degeneration, non-exudative, both eyes - early stage OU  - The incidence, anatomy, and pathology of dry AMD, risk of progression, and the AREDS and AREDS 2 study including smoking risks discussed with patient.  - Recommend amsler grid monitoring  - f/u 3 months, DFE, OCT  2. Choroidal lesion OD  - elevated choroidal lesion 1230 equator  - OCT shows hyporeflective elevated choroidal lesion superior  periphery caught on widefield -- stable   - scleral depressed exam exhibits collapse of lesion on depression -- consistent with vortex vein varix  - discussed findings, prognosis  - no retinal or ophthalmic interventions were recommended today  3,4. Lattice degeneration OU - OD: mild patches of pigmented lattice from 1000-1100 superiorly and 0430-0700 inferiorly with good laser surrounding around both areas - OS: mild lattice at 0100, VR tuft at 1030 - discussed findings, prognosis, and treatment options - s/p laser retinopexy OD (06.20.23) -- good laser changes in place - recommend laser retinopexy OS today, 07.10.23 - pt wishes to proceed with laser - RBA of procedure discussed, questions answered - informed consent obtained and signed - see procedure note - start PF QID OS x7 days - f/u in 3 weeks, sooner prn  5,6. Hypertensive retinopathy OU - discussed importance of tight BP control - monitor  7. Diabetes mellitus, type 2 without retinopathy - The incidence, risk factors for progression, natural history and treatment options for diabetic retinopathy  were discussed with patient.   - The need for close monitoring of blood glucose, blood pressure, and serum lipids, avoiding cigarette or any type of tobacco, and the need for long term follow up was also discussed with patient. - f/u in 1  year, sooner prn  8. Mixed Cataract OU - The symptoms of cataract, surgical options, and treatments and risks were discussed with patient. - discussed diagnosis and progression - under the expert management of Dr. Lucianne Lei  Ophthalmic Meds Ordered this visit:  No orders of the defined types were placed in this encounter.    No follow-ups on file.  There are no Patient Instructions on file for this visit.   Explained the diagnoses, plan, and follow up with the patient and they expressed understanding.  Patient expressed understanding of the importance of proper follow up care.   This document  serves as a record of services personally performed by Gardiner Sleeper, MD, PhD. It was created on their behalf by Roselee Nova, COMT. The creation of this record is the provider's dictation and/or activities during the visit.  Electronically signed by: Roselee Nova, COMT 03/11/22 3:07 PM  This document serves as a record of services personally performed by Gardiner Sleeper, MD, PhD. It was created on their behalf by Leonie Douglas, an ophthalmic technician. The creation of this record is the provider's dictation and/or activities during the visit.    Electronically signed by: Leonie Douglas COA, 03/11/22  3:24 PM   Gardiner Sleeper, M.D., Ph.D. Diseases & Surgery of the Retina and Vitreous [facility]  I have reviewed the above documentation for accuracy and completeness, and I agree with the above. Gardiner Sleeper, M.D., Ph.D. 03/11/22 6:31 PM   Abbreviations: M myopia (nearsighted); A astigmatism; H hyperopia (farsighted); P presbyopia; Mrx spectacle prescription;  CTL contact lenses; OD right eye; OS left eye; OU both eyes  XT exotropia; ET esotropia; PEK punctate epithelial keratitis; PEE punctate epithelial erosions; DES dry eye syndrome; MGD meibomian gland dysfunction; ATs artificial tears; PFAT's preservative free artificial tears; [place] nuclear sclerotic cataract; PSC posterior subcapsular cataract; ERM epi-retinal membrane; PVD posterior vitreous detachment; RD retinal detachment; DM diabetes mellitus; DR diabetic retinopathy; NPDR non-proliferative diabetic retinopathy; PDR proliferative diabetic retinopathy; CSME clinically significant macular edema; DME diabetic macular edema; dbh dot blot hemorrhages; CWS cotton wool spot; POAG primary open angle glaucoma; C/D cup-to-disc ratio; HVF humphrey visual field; GVF goldmann visual field; OCT optical coherence tomography; IOP intraocular pressure; BRVO Branch retinal vein occlusion; CRVO central retinal vein occlusion; CRAO  central retinal artery occlusion; BRAO branch retinal artery occlusion; RT retinal tear; SB scleral buckle; PPV pars plana vitrectomy; VH Vitreous hemorrhage; PRP panretinal laser photocoagulation; IVK intravitreal kenalog; VMT vitreomacular traction; MH Macular hole;  NVD neovascularization of the disc; NVE neovascularization elsewhere; AREDS age related eye disease study; ARMD age related macular degeneration; POAG primary open angle glaucoma; EBMD epithelial/anterior basement membrane dystrophy; ACIOL anterior chamber intraocular lens; IOL intraocular lens; PCIOL posterior chamber intraocular lens; Phaco/IOL phacoemulsification with intraocular lens placement; [place] photorefractive keratectomy; LASIK laser assisted in situ keratomileusis; HTN hypertension; DM diabetes mellitus; COPD chronic obstructive pulmonary disease

## 2022-03-11 ENCOUNTER — Encounter (INDEPENDENT_AMBULATORY_CARE_PROVIDER_SITE_OTHER): Payer: Self-pay | Admitting: Ophthalmology

## 2022-03-11 ENCOUNTER — Ambulatory Visit (INDEPENDENT_AMBULATORY_CARE_PROVIDER_SITE_OTHER): Payer: Medicare HMO | Admitting: Ophthalmology

## 2022-03-11 DIAGNOSIS — H33302 Unspecified retinal break, left eye: Secondary | ICD-10-CM | POA: Diagnosis not present

## 2022-03-11 DIAGNOSIS — H25813 Combined forms of age-related cataract, bilateral: Secondary | ICD-10-CM

## 2022-03-11 DIAGNOSIS — E119 Type 2 diabetes mellitus without complications: Secondary | ICD-10-CM

## 2022-03-11 DIAGNOSIS — H33303 Unspecified retinal break, bilateral: Secondary | ICD-10-CM | POA: Diagnosis not present

## 2022-03-11 DIAGNOSIS — H353131 Nonexudative age-related macular degeneration, bilateral, early dry stage: Secondary | ICD-10-CM | POA: Diagnosis not present

## 2022-03-11 DIAGNOSIS — I10 Essential (primary) hypertension: Secondary | ICD-10-CM

## 2022-03-11 DIAGNOSIS — H318 Other specified disorders of choroid: Secondary | ICD-10-CM | POA: Diagnosis not present

## 2022-03-11 DIAGNOSIS — H35033 Hypertensive retinopathy, bilateral: Secondary | ICD-10-CM | POA: Diagnosis not present

## 2022-03-11 DIAGNOSIS — H35413 Lattice degeneration of retina, bilateral: Secondary | ICD-10-CM

## 2022-03-12 ENCOUNTER — Encounter (INDEPENDENT_AMBULATORY_CARE_PROVIDER_SITE_OTHER): Payer: Medicare HMO | Admitting: Ophthalmology

## 2022-03-12 ENCOUNTER — Ambulatory Visit (INDEPENDENT_AMBULATORY_CARE_PROVIDER_SITE_OTHER): Payer: Medicare HMO | Admitting: Vascular Surgery

## 2022-03-18 ENCOUNTER — Other Ambulatory Visit: Payer: Medicare HMO

## 2022-03-18 DIAGNOSIS — E118 Type 2 diabetes mellitus with unspecified complications: Secondary | ICD-10-CM

## 2022-03-18 DIAGNOSIS — I10 Essential (primary) hypertension: Secondary | ICD-10-CM

## 2022-03-18 DIAGNOSIS — R531 Weakness: Secondary | ICD-10-CM

## 2022-03-18 DIAGNOSIS — E041 Nontoxic single thyroid nodule: Secondary | ICD-10-CM | POA: Diagnosis not present

## 2022-03-19 ENCOUNTER — Encounter (INDEPENDENT_AMBULATORY_CARE_PROVIDER_SITE_OTHER): Payer: Self-pay | Admitting: Vascular Surgery

## 2022-03-19 ENCOUNTER — Ambulatory Visit (INDEPENDENT_AMBULATORY_CARE_PROVIDER_SITE_OTHER): Payer: Medicare HMO | Admitting: Vascular Surgery

## 2022-03-19 VITALS — BP 135/81 | HR 71 | Resp 16 | Wt 280.0 lb

## 2022-03-19 DIAGNOSIS — E7800 Pure hypercholesterolemia, unspecified: Secondary | ICD-10-CM | POA: Diagnosis not present

## 2022-03-19 DIAGNOSIS — E118 Type 2 diabetes mellitus with unspecified complications: Secondary | ICD-10-CM

## 2022-03-19 DIAGNOSIS — I890 Lymphedema, not elsewhere classified: Secondary | ICD-10-CM

## 2022-03-19 DIAGNOSIS — I10 Essential (primary) hypertension: Secondary | ICD-10-CM

## 2022-03-19 DIAGNOSIS — E119 Type 2 diabetes mellitus without complications: Secondary | ICD-10-CM | POA: Diagnosis not present

## 2022-03-19 LAB — COMPREHENSIVE METABOLIC PANEL
AG Ratio: 1.7 (calc) (ref 1.0–2.5)
ALT: 14 U/L (ref 6–29)
AST: 16 U/L (ref 10–35)
Albumin: 3.9 g/dL (ref 3.6–5.1)
Alkaline phosphatase (APISO): 60 U/L (ref 37–153)
BUN: 14 mg/dL (ref 7–25)
CO2: 26 mmol/L (ref 20–32)
Calcium: 8.9 mg/dL (ref 8.6–10.4)
Chloride: 102 mmol/L (ref 98–110)
Creat: 1.02 mg/dL (ref 0.50–1.05)
Globulin: 2.3 g/dL (calc) (ref 1.9–3.7)
Glucose, Bld: 154 mg/dL — ABNORMAL HIGH (ref 65–99)
Potassium: 3.9 mmol/L (ref 3.5–5.3)
Sodium: 142 mmol/L (ref 135–146)
Total Bilirubin: 0.5 mg/dL (ref 0.2–1.2)
Total Protein: 6.2 g/dL (ref 6.1–8.1)

## 2022-03-19 LAB — CBC WITH DIFFERENTIAL/PLATELET
Absolute Monocytes: 445 cells/uL (ref 200–950)
Basophils Absolute: 70 cells/uL (ref 0–200)
Basophils Relative: 0.9 %
Eosinophils Absolute: 148 cells/uL (ref 15–500)
Eosinophils Relative: 1.9 %
HCT: 40.2 % (ref 35.0–45.0)
Hemoglobin: 13.9 g/dL (ref 11.7–15.5)
Lymphs Abs: 1997 cells/uL (ref 850–3900)
MCH: 31.4 pg (ref 27.0–33.0)
MCHC: 34.6 g/dL (ref 32.0–36.0)
MCV: 91 fL (ref 80.0–100.0)
MPV: 10 fL (ref 7.5–12.5)
Monocytes Relative: 5.7 %
Neutro Abs: 5140 cells/uL (ref 1500–7800)
Neutrophils Relative %: 65.9 %
Platelets: 90 10*3/uL — ABNORMAL LOW (ref 140–400)
RBC: 4.42 10*6/uL (ref 3.80–5.10)
RDW: 13.4 % (ref 11.0–15.0)
Total Lymphocyte: 25.6 %
WBC: 7.8 10*3/uL (ref 3.8–10.8)

## 2022-03-19 LAB — LIPID PANEL
Cholesterol: 206 mg/dL — ABNORMAL HIGH (ref <200)
HDL: 30 mg/dL — ABNORMAL LOW (ref >=50)
LDL Cholesterol (Calc): 141 mg/dL (calc) — ABNORMAL HIGH
Non-HDL Cholesterol (Calc): 176 mg/dL (calc) — ABNORMAL HIGH (ref <130)
Total CHOL/HDL Ratio: 6.9 (calc) — ABNORMAL HIGH (ref <5.0)
Triglycerides: 210 mg/dL — ABNORMAL HIGH (ref <150)

## 2022-03-19 LAB — HEMOGLOBIN A1C
Hgb A1c MFr Bld: 6.3 % of total Hgb — ABNORMAL HIGH (ref <5.7)
Mean Plasma Glucose: 134 mg/dL
eAG (mmol/L): 7.4 mmol/L

## 2022-03-19 LAB — TSH: TSH: 2.65 mIU/L (ref 0.40–4.50)

## 2022-03-19 NOTE — Progress Notes (Signed)
MRN : [id number]  Linda Briggs is a 66 y.o. (1956-07-24) female who presents with chief complaint of  Chief Complaint  Patient presents with   Follow-up    1 yr follow up  .  History of Present Illness: Patient returns today in follow up of her leg swelling and lymphedema.  Over the past few months, she has noticed some increased swelling which is correlated with her not using her lymphedema pump either.  She understands this is a direct correlation but has just not really felt like using it lately.  She understands she needs to start back using this more regularly.  No current weeping or open wounds.  Thick scaling dermatitis is present on both lower extremities.  Current Outpatient Medications  Medication Sig Dispense Refill   Accu-Chek Softclix Lancets lancets TEST BLOOD SUGAR THREE TIMES DAILY 300 each 1   Alcohol Swabs (DROPSAFE ALCOHOL PREP) 70 % PADS USE THREE TIMES DAILY 300 each 1   aspirin EC 81 MG tablet Take 81 mg by mouth daily.     Blood Glucose Calibration (TRUE METRIX LEVEL 2) Normal SOLN Use as directed to monitor FSBS 3x daily. Dx: E11.65 1 each 1   Blood Glucose Monitoring Suppl (TRUE METRIX AIR GLUCOSE METER) DEVI 1 each by Does not apply route daily. Use as directed to monitor FSBS 3x daily. Dx: E11.65 1 each 5   FARXIGA 10 MG TABS tablet TAKE 1 TABLET EVERY DAY BEFORE BREAKFAST 90 tablet 3   furosemide (LASIX) 20 MG tablet TAKE 1 TABLET EVERY DAY 90 tablet 3   gabapentin (NEURONTIN) 300 MG capsule TAKE 1 CAPSULE THREE TIMES DAILY 270 capsule 1   glucose blood (ACCU-CHEK GUIDE) test strip TEST BLOOD SUGAR THREE TIMES DAILY. 300 strip 11   lisinopril (ZESTRIL) 5 MG tablet TAKE 1 TABLET (5 MG TOTAL) BY MOUTH DAILY. 90 tablet 3   metFORMIN (GLUCOPHAGE) 1000 MG tablet TAKE 1 TABLET TWICE DAILY WITH MEALS 180 tablet 1   metoprolol tartrate (LOPRESSOR) 50 MG tablet TAKE 1/2 TABLET TWICE DAILY 90 tablet 1   Multiple Vitamin (MULTIVITAMIN) tablet Take 1 tablet by mouth  daily.     ondansetron (ZOFRAN) 4 MG tablet TAKE 1 TABLET (4 MG TOTAL) BY MOUTH EVERY 8 (EIGHT) HOURS AS NEEDED FOR NAUSEA OR VOMITING. 60 tablet 3   pantoprazole (PROTONIX) 40 MG tablet TAKE 1 TABLET (40 MG TOTAL) BY MOUTH 2 (TWO) TIMES DAILY BEFORE  MEALS 180 tablet 2   potassium chloride (KLOR-CON M) 10 MEQ tablet TAKE 1 TABLET EVERY DAY 90 tablet 1   predniSONE (DELTASONE) 20 MG tablet 3 tabs poqday 1-2, 2 tabs poqday 3-4, 1 tab poqday 5-6 12 tablet 0   traMADol (ULTRAM) 50 MG tablet TAKE 1 TABLET BY MOUTH EVERY 6 HOURS AS NEEDED FOR  CHRONIC  BACK  PAIN 60 tablet 0   TRULICITY 1.5 MG/0.5ML SOPN INJECT 1.5MG  (1 PEN) SUBCUTANEOUSLY EVERY WEEK AS DIRECTED 12 mL 1   pravastatin (PRAVACHOL) 40 MG tablet TAKE 1 TABLET EVERY DAY (Patient not taking: Reported on 03/19/2022) 90 tablet 3   rosuvastatin (CRESTOR) 20 MG tablet Take 1 tablet (20 mg total) by mouth daily. Stop pravastatin (Patient not taking: Reported on 03/19/2022) 90 tablet 3   No current facility-administered medications for this visit.    Past Medical History:  Diagnosis Date   Arthritis    feet & toes   Bulging disc    Diabetes mellitus    Gastroparesis  History of hepatitis B    Hyperlipidemia    Hypertension    Neuromuscular disorder (HCC)    in feet and fingers    Neuropathy    feet   Nonproliferative diabetic retinopathy (HCC)    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    Peripheral vascular disease (HCC)    Shortness of breath dyspnea    on lopressor    Sleep apnea    pt had study done but it was incomplete due to blood sugar issues     Past Surgical History:  Procedure Laterality Date   BIOPSY  04/28/2018   Procedure: BIOPSY;  Surgeon: Meryl DareStark, Malcolm T, MD;  Location: [facility];  Service: Endoscopy;;   BIOPSY THYROID     january 2016   ESOPHAGOGASTRODUODENOSCOPY (EGD) WITH PROPOFOL N/A 04/28/2018   Procedure: ESOPHAGOGASTRODUODENOSCOPY (EGD) WITH PROPOFOL;  Surgeon: Meryl DareStark, Malcolm T, MD;  Location: [facility];  Service: Endoscopy;  Laterality: N/A;   HYSTEROSCOPY WITH D & C N/A 02/15/2015   Procedure: DILATATION AND CURETTAGE /HYSTEROSCOPY;  Surgeon: Mitchel HonourMegan Morris, DO;  Location: [facility];  Service: Gynecology;  Laterality: N/A;   TONSILLECTOMY       Social History   Tobacco Use   Smoking status: Never   Smokeless tobacco: Never  Vaping Use   Vaping Use: Never used  Substance Use Topics   Alcohol use: No    Comment: social drinker.   Drug use: No      Family History  Problem Relation Age of Onset   Hyperlipidemia Mother    Heart disease Father    Hyperlipidemia Father    Hypertension Father    Heart disease Brother    Kidney disease Sister    Kidney cancer Sister    Ovarian cancer Maternal Aunt    Kidney disease Maternal Aunt        x 2   Kidney disease Maternal Uncle        x 2   Stroke Brother    Heart disease Brother    Colon cancer Neg Hx    Stomach cancer Neg Hx      Allergies  Allergen Reactions   Codeine Anaphylaxis and Hives   Penicillins Anaphylaxis, Hives and Other (See Comments)    Has patient had a PCN reaction causing immediate rash, facial/tongue/throat swelling, SOB or lightheadedness with hypotension: No Has patient had a PCN reaction causing severe rash involving mucus membranes or skin necrosis: No Has patient had a PCN reaction that required hospitalization: No Has patient had a PCN reaction occurring within the last 10 years: No If all of the above answers are "NO", then may proceed with Cephalosporin use.    Ciprofloxacin Other (See Comments)    Tongue Swelling   Food Nausea And Vomiting and Other (See Comments)    Olives    Januvia [Sitagliptin] Other (See Comments)    Stomach cramps   Latex Hives    REVIEW OF SYSTEMS (Negative unless checked)   Constitutional: [] Weight loss  [] Fever  [] Chills Cardiac: [] Chest pain   [] Chest pressure   [] Palpitations   [] Shortness of breath when laying flat   [x] Shortness of breath at rest    [x] Shortness of breath with exertion. Vascular:  [] Pain in legs with walking   [] Pain in legs at rest   [] Pain in legs when laying flat   [] Claudication   [] Pain in feet when walking  [] Pain in feet at rest  [] Pain in feet when laying flat   []   History of DVT   [] Phlebitis   [x] Swelling in legs   [] Varicose veins   [] Non-healing ulcers Pulmonary:   [] Uses home oxygen   [] Productive cough   [] Hemoptysis   [] Wheeze  [] COPD   [] Asthma Neurologic:  [] Dizziness  [] Blackouts   [] Seizures   [] History of stroke   [] History of TIA  [] Aphasia   [] Temporary blindness   [] Dysphagia   [] Weakness or numbness in arms   [] Weakness or numbness in legs Musculoskeletal:  [x] Arthritis   [] Joint swelling   [] Joint pain   [x] Low back pain Hematologic:  [] Easy bruising  [] Easy bleeding   [] Hypercoagulable state   [] Anemic  [] Hepatitis Gastrointestinal:  [] Blood in stool   [] Vomiting blood  [] Gastroesophageal reflux/heartburn   [] Abdominal pain Genitourinary:  [] Chronic kidney disease   [] Difficult urination  [] Frequent urination  [] Burning with urination   [] Hematuria Skin:  [] Rashes   [] Ulcers   [] Wounds Psychological:  [] History of anxiety   []  History of major depression.  Physical Examination  BP 135/81 (BP Location: Left Arm)   Pulse 71   Resp 16   Wt 280 lb (127 kg)   BMI 45.19 kg/m  Gen:  WD/WN, NAD Head: [place]/AT, No temporalis wasting. Ear/Nose/Throat: Hearing grossly intact, nares w/o erythema or drainage Eyes: Conjunctiva clear. Sclera non-icteric Neck: Supple.  Trachea midline Pulmonary:  Good air movement, no use of accessory muscles.  Cardiac: RRR, no JVD Vascular:  Vessel Right Left  Radial Palpable Palpable                          PT Not palpable Not palpable  DP 1+ palpable Not palpable   Gastrointestinal: soft, non-tender/non-distended. No guarding/reflex.  Musculoskeletal: M/S 5/5 throughout.  No deformity or atrophy.  Thick scaling stasis dermatitis changes are present extensively  throughout both lower extremities.  1-2+ right lower extremity edema, 2-3+ left lower extremity edema. Neurologic: Sensation grossly intact in extremities.  Symmetrical.  Speech is fluent.  Psychiatric: Judgment intact, Mood & affect appropriate for pt's clinical situation. Dermatologic: No rashes or ulcers noted.  No cellulitis or open wounds.      Labs Recent Results (from the past 2160 hour(s))  Hemoglobin A1c     Status: Abnormal   Collection Time: 03/18/22 10:28 AM  Result Value Ref Range   Hgb A1c MFr Bld 6.3 (H) <5.7 % of total Hgb    Comment: For someone without known diabetes, a hemoglobin  A1c value between 5.7% and 6.4% is consistent with prediabetes and should be confirmed with a  follow-up test. . For someone with known diabetes, a value <7% indicates that their diabetes is well controlled. A1c targets should be individualized based on duration of diabetes, age, comorbid conditions, and other considerations. . This assay result is consistent with an increased risk of diabetes. . Currently, no consensus exists regarding use of hemoglobin A1c for diagnosis of diabetes for children. .    Mean Plasma Glucose 134 mg/dL   eAG (mmol/L) 7.4 mmol/L  TSH     Status: None   Collection Time: 03/18/22 10:28 AM  Result Value Ref Range   TSH 2.65 0.40 - 4.50 mIU/L  Lipid panel     Status: Abnormal   Collection Time: 03/18/22 10:28 AM  Result Value Ref Range   Cholesterol 206 (H) <200 mg/dL   HDL 30 (L) > OR = 50 mg/dL   Triglycerides (H) <150 mg/dL    Comment: . If a non-fasting  specimen was collected, consider repeat triglyceride testing on a fasting specimen if clinically indicated.  Perry Mount et al. J. of Clin. Lipidol. 2015;9:129-169. Marland Kitchen    LDL Cholesterol (Calc) 141 (H) mg/dL (calc)    Comment: Reference range: <100 . Desirable range <100 mg/dL for primary prevention;   <70 mg/dL for patients with CHD or diabetic patients  with > or = 2 CHD risk  factors. Marland Kitchen LDL-C is now calculated using the Martin-Hopkins  calculation, which is a validated novel method providing  better accuracy than the Friedewald equation in the  estimation of LDL-C.  Horald Pollen et al. Lenox Ahr. 1610;960(45): 2061-2068  (http://education.[facility].com/faq/FAQ164)    Total CHOL/HDL Ratio 6.9 (H) <5.0 (calc)   Non-HDL Cholesterol (Calc) 176 (H) <130 mg/dL (calc)    Comment: For patients with diabetes plus 1 major ASCVD risk  factor, treating to a non-HDL-C goal of <100 mg/dL  (LDL-C of <40 mg/dL) is considered a therapeutic  option.   Comprehensive metabolic panel     Status: Abnormal   Collection Time: 03/18/22 10:28 AM  Result Value Ref Range   Glucose, Bld 154 (H) 65 - 99 mg/dL    Comment: .            Fasting reference interval . For someone without known diabetes, a glucose value >125 mg/dL indicates that they may have diabetes and this should be confirmed with a follow-up test. .    BUN 14 7 - 25 mg/dL   Creat 9.81 1.91 - 4.78 mg/dL   BUN/Creatinine Ratio NOT APPLICABLE 6 - 22 (calc)   Sodium 142 135 - 146 mmol/L   Potassium 3.9 3.5 - 5.3 mmol/L   Chloride 102 98 - 110 mmol/L   CO2 26 20 - 32 mmol/L   Calcium 8.9 8.6 - 10.4 mg/dL   Total Protein 6.2 6.1 - 8.1 g/dL   Albumin 3.9 3.6 - 5.1 g/dL   Globulin 2.3 1.9 - 3.7 g/dL (calc)   AG Ratio 1.7 1.0 - 2.5 (calc)   Total Bilirubin 0.5 0.2 - 1.2 mg/dL   Alkaline phosphatase (APISO) 60 37 - 153 U/L   AST 16 10 - 35 U/L   ALT 14 6 - 29 U/L  CBC with Differential/Platelet     Status: Abnormal   Collection Time: 03/18/22 10:28 AM  Result Value Ref Range   WBC 7.8 3.8 - 10.8 Thousand/uL   RBC 4.42 3.80 - 5.10 Million/uL   Hemoglobin 13.9 11.7 - 15.5 g/dL   HCT 29.5 62.1 - 30.8 %   MCV 91.0 80.0 - 100.0 fL   MCH 31.4 27.0 - 33.0 pg   MCHC 34.6 32.0 - 36.0 g/dL   RDW 65.7 84.6 - 96.2 %   Platelets 90 (L) 140 - 400 Thousand/uL   MPV 10.0 7.5 - 12.5 fL   Neutro Abs 5,140 1,500 - 7,800  cells/uL   Lymphs Abs 1,997 850 - 3,900 cells/uL   Absolute Monocytes 445 200 - 950 cells/uL   Eosinophils Absolute 148 15 - 500 cells/uL   Basophils Absolute 70 0 - 200 cells/uL   Neutrophils Relative % 65.9 %   Total Lymphocyte 25.6 %   Monocytes Relative 5.7 %   Eosinophils Relative 1.9 %   Basophils Relative 0.9 %    Radiology Repair Retinal Breaks, Laser - OS - Left Eye  Result Date: 03/11/2022 LASER PROCEDURE NOTE Procedure:  Barrier laser retinopexy using slit lamp laser, LEFT eye Diagnosis:   Lattice degeneration and VR tuft, LEFT  eye                     Patch of lattice at 0100; VR tuft at 1030 Surgeon: Rennis Chris, MD, PhD Anesthesia: Topical Informed consent obtained, operative eye marked, and time out performed prior to initiation of laser. Laser settings: Lumenis Smart532 laser, slit lamp Lens: Mainster PRP 165 Power: 270 mW Spot size: 200 microns Duration: 30 msec # spots: 448 Placement of laser: Using a Mainster PRP 165 contact lens at the slit lamp, laser was placed in three confluent rows around patch of lattice at 0100 and around pigmented VR tuft at 1030 oclock anterior to equator. Complications: None. Patient tolerated the procedure well and received written and verbal post-procedure care information/education.   OCT, Retina - OU - Both Eyes  Result Date: 03/11/2022 Right Eye Quality was good. Central Foveal Thickness: 290. Progression has been stable. Findings include normal foveal contour, no IRF, no SRF, retinal drusen , vitreomacular adhesion (Hyporeflective elevated choroidal lesion superior periphery caught on widefield ). Left Eye Quality was good. Central Foveal Thickness: 289. Progression has been stable. Findings include normal foveal contour, no IRF, no SRF, retinal drusen , vitreomacular adhesion . Notes *Images captured and stored on drive Diagnosis / Impression: NFP; no IRF/SRF OU +drusen OU OD: Hyporeflective elevated choroidal lesion superior periphery caught  on widefield -- stable Clinical management: See below Abbreviations: NFP - Normal foveal profile. CME - cystoid macular edema. PED - pigment epithelial detachment. IRF - intraretinal fluid. SRF - subretinal fluid. EZ - ellipsoid zone. ERM - epiretinal membrane. ORA - outer retinal atrophy. ORT - outer retinal tubulation. SRHM - subretinal hyper-reflective material. IRHM - intraretinal hyper-reflective material   Repair Retinal Breaks, Laser - OD - Right Eye  Result Date: 02/19/2022 LASER PROCEDURE NOTE Procedure:  Barrier laser retinopexy using slit lamp laser, RIGHT eye Diagnosis:   Lattice degeneration w/ atrophic holes, RIGHT eye                     Patches of lattice: 1000-1100 superiorly; 0430-0700 inferiorly Surgeon: Rennis Chris, MD, PhD Anesthesia: Topical Informed consent obtained, operative eye marked, and time out performed prior to initiation of laser. Laser settings: Lumenis Smart532 laser, slit lamp Lens: Mainster PRP 165 Power: 320 mW Spot size: 200 microns Duration: 30 msec # spots: 520 Placement of laser: Using a Mainster PRP 165 contact lens at the slit lamp, laser was placed in three confluent rows around patches of lattice w/ atrophic holes from 1000-1100 and 0430-0700 oclock anterior to equator Complications: None. Patient tolerated the procedure well and received written and verbal post-procedure care information/education.   Color Fundus Photography Optos - OU - Both Eyes  Result Date: 02/19/2022 Right Eye Progression has no prior data. Disc findings include increased cup to disc ratio. Macula : normal observations. Vessels : normal observations. Periphery : mass, lattice, RPE abnormality. Left Eye Progression has no prior data. Notes **Images stored on drive** Impression: OD: pigmented choroidal lesion at 1200 equator, pigmented lattice ST periphery, midzonal drusen OS: not imaged today   OCT, Retina - OU - Both Eyes  Result Date: 02/19/2022 Right Eye Quality was good. Central  Foveal Thickness: 291. Progression has been stable. Findings include normal foveal contour, no IRF, no SRF, retinal drusen , vitreomacular adhesion (Hyporeflective elevated choroidal lesion superior periphery caught on widefield ). Left Eye Quality was good. Central Foveal Thickness: 289. Progression has been stable. Findings include normal foveal contour, no IRF,  no SRF, retinal drusen , vitreomacular adhesion . Notes *Images captured and stored on drive Diagnosis / Impression: NFP; no IRF/SRF OU +drusen OU OD: Hyporeflective elevated choroidal lesion superior periphery caught on widefield -- stable Clinical management: See below Abbreviations: NFP - Normal foveal profile. CME - cystoid macular edema. PED - pigment epithelial detachment. IRF - intraretinal fluid. SRF - subretinal fluid. EZ - ellipsoid zone. ERM - epiretinal membrane. ORA - outer retinal atrophy. ORT - outer retinal tubulation. SRHM - subretinal hyper-reflective material. IRHM - intraretinal hyper-reflective material    Assessment/Plan Hyperlipidemia lipid control important in reducing the progression of atherosclerotic disease. Continue statin therapy     Type 2 diabetes mellitus with complications blood glucose control important in reducing the progression of atherosclerotic disease. Also, involved in wound healing. On appropriate medications.     Essential hypertension blood pressure control important in reducing the progression of atherosclerotic disease. On appropriate oral medications.  Lymphedema Patient was encouraged to get back to using her lymphedema pump more regularly, compression garments, and elevating her legs.  This will be of paramount importance of getting her swelling under better control.  Were going to hold off on Unna boot wraps today, but if she develops weeping or ulceration she will need these.  Otherwise, I will see her annually.    Festus Barren, MD  03/19/2022 2:27 PM    This note was created with  Dragon medical transcription system.  Any errors from dictation are purely unintentional

## 2022-03-19 NOTE — Assessment & Plan Note (Signed)
Patient was encouraged to get back to using her lymphedema pump more regularly, compression garments, and elevating her legs.  This will be of paramount importance of getting her swelling under better control.  Were going to hold off on Unna boot wraps today, but if she develops weeping or ulceration she will need these.  Otherwise, I will see her annually.

## 2022-03-21 ENCOUNTER — Telehealth: Payer: Self-pay | Admitting: Pharmacist

## 2022-03-21 NOTE — Progress Notes (Signed)
Chronic Care Management Pharmacy Assistant   Name: Linda Briggs  MRN: [id number] DOB: [birth year]   Reason for Encounter: Disease State - Diabetes Call    Recent office visits:  09/24/21 Lynnea Ferrier, MD - Family Medicine - Diabetes - rosuvastatin (CRESTOR) 20 MG tablet prescribed. Pravastatin discontinued. Mammogram ordered. Follow up as scheduled.   Recent consult visits:  03/19/22 Festus Barren MD - Vascular surgery - Lymphedema - No changes in regimen. Follow up in 1 year.    Hospital visits:  None in previous 6 months  Medications: Outpatient Encounter Medications as of 03/21/2022  Medication Sig   Accu-Chek Softclix Lancets lancets TEST BLOOD SUGAR THREE TIMES DAILY   Alcohol Swabs (DROPSAFE ALCOHOL PREP) 70 % PADS USE THREE TIMES DAILY   aspirin EC 81 MG tablet Take 81 mg by mouth daily.   Blood Glucose Calibration (TRUE METRIX LEVEL 2) Normal SOLN Use as directed to monitor FSBS 3x daily. Dx: E11.65   Blood Glucose Monitoring Suppl (TRUE METRIX AIR GLUCOSE METER) DEVI 1 each by Does not apply route daily. Use as directed to monitor FSBS 3x daily. Dx: E11.65   FARXIGA 10 MG TABS tablet TAKE 1 TABLET EVERY DAY BEFORE BREAKFAST   furosemide (LASIX) 20 MG tablet TAKE 1 TABLET EVERY DAY   gabapentin (NEURONTIN) 300 MG capsule TAKE 1 CAPSULE THREE TIMES DAILY   glucose blood (ACCU-CHEK GUIDE) test strip TEST BLOOD SUGAR THREE TIMES DAILY.   lisinopril (ZESTRIL) 5 MG tablet TAKE 1 TABLET (5 MG TOTAL) BY MOUTH DAILY.   metFORMIN (GLUCOPHAGE) 1000 MG tablet TAKE 1 TABLET TWICE DAILY WITH MEALS   metoprolol tartrate (LOPRESSOR) 50 MG tablet TAKE 1/2 TABLET TWICE DAILY   Multiple Vitamin (MULTIVITAMIN) tablet Take 1 tablet by mouth daily.   ondansetron (ZOFRAN) 4 MG tablet TAKE 1 TABLET (4 MG TOTAL) BY MOUTH EVERY 8 (EIGHT) HOURS AS NEEDED FOR NAUSEA OR VOMITING.   pantoprazole (PROTONIX) 40 MG tablet TAKE 1 TABLET (40 MG TOTAL) BY MOUTH 2 (TWO) TIMES DAILY BEFORE  MEALS    potassium chloride (KLOR-CON M) 10 MEQ tablet TAKE 1 TABLET EVERY DAY   pravastatin (PRAVACHOL) 40 MG tablet TAKE 1 TABLET EVERY DAY (Patient not taking: Reported on 03/19/2022)   predniSONE (DELTASONE) 20 MG tablet 3 tabs poqday 1-2, 2 tabs poqday 3-4, 1 tab poqday 5-6   rosuvastatin (CRESTOR) 20 MG tablet Take 1 tablet (20 mg total) by mouth daily. Stop pravastatin (Patient not taking: Reported on 03/19/2022)   traMADol (ULTRAM) 50 MG tablet TAKE 1 TABLET BY MOUTH EVERY 6 HOURS AS NEEDED FOR  CHRONIC  BACK  PAIN   TRULICITY 1.5 MG/0.5ML SOPN INJECT 1.5MG  (1 PEN) SUBCUTANEOUSLY EVERY WEEK AS DIRECTED   No facility-administered encounter medications on file as of 03/21/2022.    Current antihyperglycemic regimen:  TRULICITY 1.5 MG/0.5ML SOPN MetFORMIN (GLUCOPHAGE) 1000 MG tablet FARXIGA 10 MG TABS tablet   What recent interventions/DTPs have been made to improve glycemic control:  Patient denied any recent changes in medications since last visit with [facility]  Have there been any recent hospitalizations or ED visits since last visit with [facility]?  Patient has not had any hospitalizations or ED visits since last visit with [facility]  Patient denies hypoglycemic symptoms, including Pale, Sweaty, Shaky, Hungry, Nervous/irritable, and Vision changes   Patient denies hyperglycemic symptoms, including blurry vision, excessive thirst, fatigue, polyuria, and weakness   How often are you checking your blood sugar? Patient reported checking blood sugars regularly  What are your blood sugars ranging?  Fasting:  Before meals:  After meals:  Bedtime:   During the week, how often does your blood glucose drop below 70? Patient reported no readings 70 or below  Are you checking your feet daily/regularly? Patient reported checking her feet regularly. She denied having any concerns currently.     Adherence Review: Is the patient currently on a STATIN medication? Yes Is the patient currently on ACE/ARB  medication? Yes Does the patient have >5 day gap between last estimated fill dates? Yes   Care Gaps  AWV: done 07/04/21 Colonoscopy: EGD done 04/28/18 DM Eye Exam: due 12/14/22 DM Foot Exam: due 03/26/22 Microalbumin: done 09/25/20 HbgAIC: done 03/18/22 (6.3) DEXA: unknown Mammogram: done 02/19/16 (has been ordered)   Star Rating Drugs: TRULICITY 1.5 MG/0.5ML SOPN - last filled 01/16/22 84 days  rosuvastatin (CRESTOR) 20 MG tablet - last filled 09/24/21 90 days  metFORMIN (GLUCOPHAGE) 1000 MG tablet - last filled 01/21/22 90 days  lisinopril (ZESTRIL) 5 MG tablet - last filled 01/26/22 90 days  FARXIGA 10 MG TABS tablet - last filled 02/04/22 90 days    Future Appointments  Date Time Provider Department Center  03/25/2022 11:30 AM Donita Brooks, MD [facility]  04/01/2022  2:00 PM Rennis Chris, MD [facility] None  08/15/2022  2:00 PM [facility]  03/18/2023  1:00 PM Dew, Marlow Baars, MD [facility] None    Eugenie Filler, [facility] Clinical Pharmacist Assistant  [phone]

## 2022-03-25 ENCOUNTER — Ambulatory Visit: Payer: Medicare HMO | Admitting: Family Medicine

## 2022-03-25 VITALS — BP 118/62 | HR 75 | Temp 97.8°F | Ht 66.0 in | Wt 277.8 lb

## 2022-03-25 DIAGNOSIS — E7800 Pure hypercholesterolemia, unspecified: Secondary | ICD-10-CM | POA: Diagnosis not present

## 2022-03-25 DIAGNOSIS — E118 Type 2 diabetes mellitus with unspecified complications: Secondary | ICD-10-CM

## 2022-03-25 DIAGNOSIS — I890 Lymphedema, not elsewhere classified: Secondary | ICD-10-CM

## 2022-03-25 DIAGNOSIS — M5432 Sciatica, left side: Secondary | ICD-10-CM | POA: Diagnosis not present

## 2022-03-25 DIAGNOSIS — I10 Essential (primary) hypertension: Secondary | ICD-10-CM | POA: Diagnosis not present

## 2022-03-25 MED ORDER — PRAVASTATIN SODIUM 40 MG PO TABS: 40.0000 mg | ORAL_TABLET | Freq: Every day | ORAL | 3 refills | Status: DC

## 2022-03-25 NOTE — Progress Notes (Signed)
Subjective:    Patient ID: Linda Briggs, female    DOB: [birth year], 66 y.o.   MRN: [id number]  Patient is here today for a follow-up on her diabetes.  She continues to have numbness in both of her feet and fibrotic skin changes secondary to chronic lymphedema in both legs.   At her last office visit, I stopped pravastatin and replace with rosuvastatin to try to get her LDL cholesterol below 100.  The patient had severe myalgias on rosuvastatin so she discontinued all the statins.  As result, her LDL cholesterol has risen substantially.  She denies any side effects on Farxiga.  She denies any polyuria polydipsia or blurry vision.  She denies any yeast infection.  Her blood pressure today is well controlled at 118/62.  She denies any chest pain shortness of breath or dyspnea on exertion. Lab on 03/18/2022  Component Date Value Ref Range Status   Hgb A1c MFr Bld 03/18/2022 6.3 (H)  <5.7 % of total Hgb Final   Comment: For someone without known diabetes, a hemoglobin  A1c value between 5.7% and 6.4% is consistent with prediabetes and should be confirmed with a  follow-up test. . For someone with known diabetes, a value <7% indicates that their diabetes is well controlled. A1c targets should be individualized based on duration of diabetes, age, comorbid conditions, and other considerations. . This assay result is consistent with an increased risk of diabetes. . Currently, no consensus exists regarding use of hemoglobin A1c for diagnosis of diabetes for children. .    Mean Plasma Glucose 03/18/2022 134  mg/dL Final   eAG (mmol/L) 25/36/6440 7.4  mmol/L Final   TSH 03/18/2022 2.65  0.40 - 4.50 mIU/L Final   Cholesterol 03/18/2022 206 (H)  <200 mg/dL Final   HDL 34/74/2595 30 (L)  > OR = 50 mg/dL Final   Triglycerides 63/87/5643 210 (H)  <150 mg/dL Final   Comment: . If a non-fasting specimen was collected, consider repeat triglyceride testing on a fasting specimen if clinically  indicated.  Perry Mount et al. J. of Clin. Lipidol. 2015;9:129-169. Marland Kitchen    LDL Cholesterol (Calc) 03/18/2022 141 (H)  mg/dL (calc) Final   Comment: Reference range: <100 . Desirable range <100 mg/dL for primary prevention;   <70 mg/dL for patients with CHD or diabetic patients  with > or = 2 CHD risk factors. Marland Kitchen LDL-C is now calculated using the Martin-Hopkins  calculation, which is a validated novel method providing  better accuracy than the Friedewald equation in the  estimation of LDL-C.  Horald Pollen et al. Lenox Ahr. 3295;188(41): 2061-2068  (http://education.[facility].com/faq/FAQ164)    Total CHOL/HDL Ratio 03/18/2022 6.9 (H)  <5.0 (calc) Final   Non-HDL Cholesterol (Calc) 03/18/2022 176 (H)  <130 mg/dL (calc) Final   Comment: For patients with diabetes plus 1 major ASCVD risk  factor, treating to a non-HDL-C goal of <100 mg/dL  (LDL-C of <66 mg/dL) is considered a therapeutic  option.    Glucose, Bld 03/18/2022 154 (H)  65 - 99 mg/dL Final   Comment: .            Fasting reference interval . For someone without known diabetes, a glucose value >125 mg/dL indicates that they may have diabetes and this should be confirmed with a follow-up test. .    BUN 03/18/2022 14  7 - 25 mg/dL Final   Creat 03/01/1600 1.02  0.50 - 1.05 mg/dL Final   BUN/Creatinine Ratio 03/18/2022 NOT APPLICABLE  6 - 22 (calc)  Final   Sodium 03/18/2022 142  135 - 146 mmol/L Final   Potassium 03/18/2022 3.9  3.5 - 5.3 mmol/L Final   Chloride 03/18/2022 102  98 - 110 mmol/L Final   CO2 03/18/2022 26  20 - 32 mmol/L Final   Calcium 03/18/2022 8.9  8.6 - 10.4 mg/dL Final   Total Protein 16/06/9603 6.2  6.1 - 8.1 g/dL Final   Albumin 54/05/8118 3.9  3.6 - 5.1 g/dL Final   Globulin 14/78/2956 2.3  1.9 - 3.7 g/dL (calc) Final   AG Ratio 03/18/2022 1.7  1.0 - 2.5 (calc) Final   Total Bilirubin 03/18/2022 0.5  0.2 - 1.2 mg/dL Final   Alkaline phosphatase (APISO) 03/18/2022 60  37 - 153 U/L Final   AST  03/18/2022 16  10 - 35 U/L Final   ALT 03/18/2022 14  6 - 29 U/L Final   WBC 03/18/2022 7.8  3.8 - 10.8 Thousand/uL Final   RBC 03/18/2022 4.42  3.80 - 5.10 Million/uL Final   Hemoglobin 03/18/2022 13.9  11.7 - 15.5 g/dL Final   HCT 21/30/8657 40.2  35.0 - 45.0 % Final   MCV 03/18/2022 91.0  80.0 - 100.0 fL Final   MCH 03/18/2022 31.4  27.0 - 33.0 pg Final   MCHC 03/18/2022 34.6  32.0 - 36.0 g/dL Final   RDW 84/69/6295 13.4  11.0 - 15.0 % Final   Platelets 03/18/2022 90 (L)  140 - 400 Thousand/uL Final   MPV 03/18/2022 10.0  7.5 - 12.5 fL Final   Neutro Abs 03/18/2022 5,140  1,500 - 7,800 cells/uL Final   Lymphs Abs 03/18/2022 1,997  850 - 3,900 cells/uL Final   Absolute Monocytes 03/18/2022 445  200 - 950 cells/uL Final   Eosinophils Absolute 03/18/2022 148  15 - 500 cells/uL Final   Basophils Absolute 03/18/2022 70  0 - 200 cells/uL Final   Neutrophils Relative % 03/18/2022 65.9  % Final   Total Lymphocyte 03/18/2022 25.6  % Final   Monocytes Relative 03/18/2022 5.7  % Final   Eosinophils Relative 03/18/2022 1.9  % Final   Basophils Relative 03/18/2022 0.9  % Final    Past Medical History:  Diagnosis Date   Arthritis    feet & toes   Bulging disc    Diabetes mellitus    Gastroparesis    History of hepatitis B    Hyperlipidemia    Hypertension    Neuromuscular disorder (HCC)    in feet and fingers    Neuropathy    feet   Nonproliferative diabetic retinopathy (HCC)    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    Peripheral vascular disease (HCC)    Shortness of breath dyspnea    on lopressor    Sleep apnea    pt had study done but it was incomplete due to blood sugar issues    Past Surgical History:  Procedure Laterality Date   BIOPSY  04/28/2018   Procedure: BIOPSY;  Surgeon: Meryl Dare, MD;  Location: [facility];  Service: Endoscopy;;   BIOPSY THYROID     january 2016   ESOPHAGOGASTRODUODENOSCOPY (EGD) WITH PROPOFOL N/A 04/28/2018   Procedure:  ESOPHAGOGASTRODUODENOSCOPY (EGD) WITH PROPOFOL;  Surgeon: Meryl Dare, MD;  Location: [facility];  Service: Endoscopy;  Laterality: N/A;   HYSTEROSCOPY WITH D & C N/A 02/15/2015   Procedure: DILATATION AND CURETTAGE /HYSTEROSCOPY;  Surgeon: Mitchel Honour, DO;  Location: [facility];  Service: Gynecology;  Laterality: N/A;   TONSILLECTOMY  Current Outpatient Medications on File Prior to Visit  Medication Sig Dispense Refill   Accu-Chek Softclix Lancets lancets TEST BLOOD SUGAR THREE TIMES DAILY 300 each 1   Alcohol Swabs (DROPSAFE ALCOHOL PREP) 70 % PADS USE THREE TIMES DAILY 300 each 1   aspirin EC 81 MG tablet Take 81 mg by mouth daily.     Blood Glucose Calibration (TRUE METRIX LEVEL 2) Normal SOLN Use as directed to monitor FSBS 3x daily. Dx: E11.65 1 each 1   Blood Glucose Monitoring Suppl (TRUE METRIX AIR GLUCOSE METER) DEVI 1 each by Does not apply route daily. Use as directed to monitor FSBS 3x daily. Dx: E11.65 1 each 5   FARXIGA 10 MG TABS tablet TAKE 1 TABLET EVERY DAY BEFORE BREAKFAST 90 tablet 3   furosemide (LASIX) 20 MG tablet TAKE 1 TABLET EVERY DAY 90 tablet 3   gabapentin (NEURONTIN) 300 MG capsule TAKE 1 CAPSULE THREE TIMES DAILY 270 capsule 1   glucose blood (ACCU-CHEK GUIDE) test strip TEST BLOOD SUGAR THREE TIMES DAILY. 300 strip 11   lisinopril (ZESTRIL) 5 MG tablet TAKE 1 TABLET (5 MG TOTAL) BY MOUTH DAILY. 90 tablet 3   metFORMIN (GLUCOPHAGE) 1000 MG tablet TAKE 1 TABLET TWICE DAILY WITH MEALS 180 tablet 1   metoprolol tartrate (LOPRESSOR) 50 MG tablet TAKE 1/2 TABLET TWICE DAILY 90 tablet 1   Multiple Vitamin (MULTIVITAMIN) tablet Take 1 tablet by mouth daily.     ondansetron (ZOFRAN) 4 MG tablet TAKE 1 TABLET (4 MG TOTAL) BY MOUTH EVERY 8 (EIGHT) HOURS AS NEEDED FOR NAUSEA OR VOMITING. 60 tablet 3   pantoprazole (PROTONIX) 40 MG tablet TAKE 1 TABLET (40 MG TOTAL) BY MOUTH 2 (TWO) TIMES DAILY BEFORE  MEALS 180 tablet 2   potassium chloride (KLOR-CON M) 10 MEQ tablet  TAKE 1 TABLET EVERY DAY 90 tablet 1   pravastatin (PRAVACHOL) 40 MG tablet TAKE 1 TABLET EVERY DAY (Patient not taking: Reported on 03/19/2022) 90 tablet 3   predniSONE (DELTASONE) 20 MG tablet 3 tabs poqday 1-2, 2 tabs poqday 3-4, 1 tab poqday 5-6 12 tablet 0   rosuvastatin (CRESTOR) 20 MG tablet Take 1 tablet (20 mg total) by mouth daily. Stop pravastatin (Patient not taking: Reported on 03/19/2022) 90 tablet 3   traMADol (ULTRAM) 50 MG tablet TAKE 1 TABLET BY MOUTH EVERY 6 HOURS AS NEEDED FOR  CHRONIC  BACK  PAIN 60 tablet 0   TRULICITY 1.5 MG/0.5ML SOPN INJECT 1.5MG  (1 PEN) SUBCUTANEOUSLY EVERY WEEK AS DIRECTED 12 mL 1   No current facility-administered medications on file prior to visit.   Allergies  Allergen Reactions   Codeine Anaphylaxis and Hives   Penicillins Anaphylaxis, Hives and Other (See Comments)    Has patient had a PCN reaction causing immediate rash, facial/tongue/throat swelling, SOB or lightheadedness with hypotension: No Has patient had a PCN reaction causing severe rash involving mucus membranes or skin necrosis: No Has patient had a PCN reaction that required hospitalization: No Has patient had a PCN reaction occurring within the last 10 years: No If all of the above answers are "NO", then may proceed with Cephalosporin use.    Ciprofloxacin Other (See Comments)    Tongue Swelling   Food Nausea And Vomiting and Other (See Comments)    Magnus Ivan [Sitagliptin] Other (See Comments)    Stomach cramps   Latex Hives   Social History   Socioeconomic History   Marital status: Single    Spouse name:  Not on file   Number of children: 0   Years of education: Not on file   Highest education level: Not on file  Occupational History   Occupation: retired  Tobacco Use   Smoking status: Never   Smokeless tobacco: Never  Vaping Use   Vaping Use: Never used  Substance and Sexual Activity   Alcohol use: No    Comment: social drinker.   Drug use: No   Sexual  activity: Not Currently  Other Topics Concern   Not on file  Social History Narrative   Lives alone. 1 brother living.    Social Determinants of Health   Financial Resource Strain: Low Risk  (08/09/2021)   Overall Financial Resource Strain (CARDIA)    Difficulty of Paying Living Expenses: Not very hard  Food Insecurity: No Food Insecurity (08/09/2021)   Hunger Vital Sign    Worried About Running Out of Food in the Last Year: Never true    Ran Out of Food in the Last Year: Never true  Transportation Needs: No Transportation Needs (08/09/2021)   PRAPARE - Administrator, Civil ServiceTransportation    Lack of Transportation (Medical): No    Lack of Transportation (Non-Medical): No  Physical Activity: Inactive (08/09/2021)   Exercise Vital Sign    Days of Exercise per Week: 0 days    Minutes of Exercise per Session: 0 min  Stress: No Stress Concern Present (08/09/2021)   [employer] - Occupational Stress Questionnaire    Feeling of Stress : Not at all  Social Connections: Moderately Integrated (08/09/2021)   Social Connection and Isolation Panel [NHANES]    Frequency of Communication with Friends and Family: More than three times a week    Frequency of Social Gatherings with Friends and Family: More than three times a week    Attends Religious Services: More than 4 times per year    Active Member of [employer] or Organizations: Yes    Attends BankerClub or Organization Meetings: More than 4 times per year    Marital Status: Never married  Intimate Partner Violence: Not At Risk (08/09/2021)   Humiliation, Afraid, Rape, and Kick questionnaire    Fear of Current or Ex-Partner: No    Emotionally Abused: No    Physically Abused: No    Sexually Abused: No   Family History  Problem Relation Age of Onset   Hyperlipidemia Mother    Heart disease Father    Hyperlipidemia Father    Hypertension Father    Heart disease Brother    Kidney disease Sister    Kidney cancer Sister    Ovarian cancer  Maternal Aunt    Kidney disease Maternal Aunt        x 2   Kidney disease Maternal Uncle        x 2   Stroke Brother    Heart disease Brother    Colon cancer Neg Hx    Stomach cancer Neg Hx       Review of Systems  All other systems reviewed and are negative.      Objective:   Physical Exam Vitals reviewed.  Constitutional:      General: She is not in acute distress.    Appearance: She is well-developed. She is not diaphoretic.  HENT:     Head: Normocephalic and atraumatic.     Right Ear: External ear normal.     Left Ear: External ear normal.     Nose: Nose normal.     Mouth/Throat:  Pharynx: No oropharyngeal exudate.  Eyes:     General: No scleral icterus.       Right eye: No discharge.        Left eye: No discharge.     Conjunctiva/sclera: Conjunctivae normal.     Pupils: Pupils are equal, round, and reactive to light.  Neck:     Thyroid: No thyromegaly.     Vascular: No JVD.     Trachea: No tracheal deviation.  Cardiovascular:     Rate and Rhythm: Normal rate and regular rhythm.     Heart sounds: Normal heart sounds. No murmur heard.    No friction rub. No gallop.  Pulmonary:     Effort: Pulmonary effort is normal. No respiratory distress.     Breath sounds: Normal breath sounds. No stridor. No wheezing or rales.  Chest:     Chest wall: No tenderness.  Abdominal:     General: Bowel sounds are normal. There is no distension.     Palpations: Abdomen is soft. There is no mass.     Tenderness: There is no abdominal tenderness. There is no guarding or rebound.  Musculoskeletal:        General: No tenderness. Normal range of motion.     Cervical back: Normal range of motion and neck supple.  Lymphadenopathy:     Cervical: No cervical adenopathy.  Skin:    General: Skin is warm.     Coloration: Skin is not pale.     Findings: No erythema or rash.  Neurological:     Mental Status: She is alert and oriented to person, place, and time.     Cranial Nerves:  No cranial nerve deficit.     Motor: No abnormal muscle tone.     Coordination: Coordination normal.     Deep Tendon Reflexes: Reflexes are normal and symmetric.  Psychiatric:        Behavior: Behavior normal.        Thought Content: Thought content normal.        Judgment: Judgment normal.         Assessment & Plan:   Left sided sciatica - Plan: DG Lumbar Spine Complete  Pure hypercholesterolemia  Type 2 diabetes mellitus with complications (HCC)  Lymphedema  Essential hypertension I am very happy with the patient's blood pressure.  I am also very happy with her hemoglobin A1c.  I asked her to resume pravastatin 40 mg a day and recheck lab work in 6 months.  Her diabetes is well controlled.  She does complain of back pain with left-sided sciatica.  Is been going on now for about a week.  Earlier this year she had back pain with right-sided sciatica.  As result, I would like to get a x-ray of the lumbar spine to evaluate further.  She denies any symptoms of cauda equina syndrome such as bowel or bladder incontinence or saddle anesthesia but she does have chronic neuropathy and numbness in both feet so symptoms are somewhat confusing.

## 2022-03-28 NOTE — Progress Notes (Signed)
[facility] - Clinic Note  04/01/2022     CHIEF COMPLAINT Patient presents for Retina Follow Up   HISTORY OF PRESENT ILLNESS: Linda Briggs is a 66 y.o. female who presents to the clinic today for:   HPI     Retina Follow Up   Patient presents with  Dry AMD.  In both eyes.  This started months ago.  Severity is moderate.  Duration of 3 weeks.  Since onset it is stable.  I, the attending physician,  performed the HPI with the patient and updated documentation appropriately.        Comments   Patient denies noticing vision changes at this time.       Last edited by Rennis Chris, MD on 04/01/2022  4:06 PM.    Pt states no problems after last laser procedure, her A1c is down to 6.3  Referring physician: Donita Brooks, MD [address] [place],  [place] [address]  HISTORICAL INFORMATION:   Selected notes from the MEDICAL RECORD NUMBER Referred by Dr. Zenaida Niece for retinal eval LEE:  Ocular Hx- PMH-    CURRENT MEDICATIONS: No current outpatient medications on file. (Ophthalmic Drugs)   No current facility-administered medications for this visit. (Ophthalmic Drugs)   Current Outpatient Medications (Other)  Medication Sig   Accu-Chek Softclix Lancets lancets TEST BLOOD SUGAR THREE TIMES DAILY   Alcohol Swabs (DROPSAFE ALCOHOL PREP) 70 % PADS USE THREE TIMES DAILY   aspirin EC 81 MG tablet Take 81 mg by mouth daily.   Blood Glucose Calibration (TRUE METRIX LEVEL 2) Normal SOLN Use as directed to monitor FSBS 3x daily. Dx: E11.65   Blood Glucose Monitoring Suppl (TRUE METRIX AIR GLUCOSE METER) DEVI 1 each by Does not apply route daily. Use as directed to monitor FSBS 3x daily. Dx: E11.65   FARXIGA 10 MG TABS tablet TAKE 1 TABLET EVERY DAY BEFORE BREAKFAST   furosemide (LASIX) 20 MG tablet TAKE 1 TABLET EVERY DAY   gabapentin (NEURONTIN) 300 MG capsule TAKE 1 CAPSULE THREE TIMES DAILY   glucose blood (ACCU-CHEK GUIDE) test strip TEST BLOOD SUGAR  THREE TIMES DAILY.   lisinopril (ZESTRIL) 5 MG tablet TAKE 1 TABLET (5 MG TOTAL) BY MOUTH DAILY. (Patient not taking: Reported on 03/25/2022)   metFORMIN (GLUCOPHAGE) 1000 MG tablet TAKE 1 TABLET TWICE DAILY WITH MEALS   metoprolol tartrate (LOPRESSOR) 50 MG tablet TAKE 1/2 TABLET TWICE DAILY   Multiple Vitamin (MULTIVITAMIN) tablet Take 1 tablet by mouth daily.   ondansetron (ZOFRAN) 4 MG tablet TAKE 1 TABLET (4 MG TOTAL) BY MOUTH EVERY 8 (EIGHT) HOURS AS NEEDED FOR NAUSEA OR VOMITING.   pantoprazole (PROTONIX) 40 MG tablet TAKE 1 TABLET (40 MG TOTAL) BY MOUTH 2 (TWO) TIMES DAILY BEFORE  MEALS   potassium chloride (KLOR-CON M) 10 MEQ tablet TAKE 1 TABLET EVERY DAY   pravastatin (PRAVACHOL) 40 MG tablet Take 1 tablet (40 mg total) by mouth daily.   predniSONE (DELTASONE) 20 MG tablet 3 tabs poqday 1-2, 2 tabs poqday 3-4, 1 tab poqday 5-6   traMADol (ULTRAM) 50 MG tablet TAKE 1 TABLET BY MOUTH EVERY 6 HOURS AS NEEDED FOR  CHRONIC  BACK  PAIN   TRULICITY 1.5 MG/0.5ML SOPN INJECT 1.5MG  (1 PEN) SUBCUTANEOUSLY EVERY WEEK AS DIRECTED   No current facility-administered medications for this visit. (Other)   REVIEW OF SYSTEMS: ROS   Positive for: Endocrine, Eyes Negative for: Constitutional, Gastrointestinal, Neurological, Skin, Genitourinary, Musculoskeletal, HENT, Cardiovascular,  Respiratory, Psychiatric, Allergic/Imm, Heme/Lymph Last edited by Julieanne Cotton, COT on 04/01/2022  1:48 PM.     ALLERGIES Allergies  Allergen Reactions   Codeine Anaphylaxis and Hives   Penicillins Anaphylaxis, Hives and Other (See Comments)    Has patient had a PCN reaction causing immediate rash, facial/tongue/throat swelling, SOB or lightheadedness with hypotension: No Has patient had a PCN reaction causing severe rash involving mucus membranes or skin necrosis: No Has patient had a PCN reaction that required hospitalization: No Has patient had a PCN reaction occurring within the last 10 years: No If all of  the above answers are "NO", then may proceed with Cephalosporin use.    Ciprofloxacin Other (See Comments)    Tongue Swelling   Food Nausea And Vomiting and Other (See Comments)    Olives    Januvia [Sitagliptin] Other (See Comments)    Stomach cramps   Latex Hives   PAST MEDICAL HISTORY Past Medical History:  Diagnosis Date   Arthritis    feet & toes   Bulging disc    Diabetes mellitus    Gastroparesis    History of hepatitis B    Hyperlipidemia    Hypertension    Neuromuscular disorder (HCC)    in feet and fingers    Neuropathy    feet   Nonproliferative diabetic retinopathy (HCC)    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    Peripheral vascular disease (HCC)    Shortness of breath dyspnea    on lopressor    Sleep apnea    pt had study done but it was incomplete due to blood sugar issues    Past Surgical History:  Procedure Laterality Date   BIOPSY  04/28/2018   Procedure: BIOPSY;  Surgeon: Meryl Dare, MD;  Location: [facility];  Service: Endoscopy;;   BIOPSY THYROID     january 2016   ESOPHAGOGASTRODUODENOSCOPY (EGD) WITH PROPOFOL N/A 04/28/2018   Procedure: ESOPHAGOGASTRODUODENOSCOPY (EGD) WITH PROPOFOL;  Surgeon: Meryl Dare, MD;  Location: [facility];  Service: Endoscopy;  Laterality: N/A;   HYSTEROSCOPY WITH D & C N/A 02/15/2015   Procedure: DILATATION AND CURETTAGE /HYSTEROSCOPY;  Surgeon: Mitchel Honour, DO;  Location: [facility];  Service: Gynecology;  Laterality: N/A;   TONSILLECTOMY     FAMILY HISTORY Family History  Problem Relation Age of Onset   Hyperlipidemia Mother    Heart disease Father    Hyperlipidemia Father    Hypertension Father    Heart disease Brother    Kidney disease Sister    Kidney cancer Sister    Ovarian cancer Maternal Aunt    Kidney disease Maternal Aunt        x 2   Kidney disease Maternal Uncle        x 2   Stroke Brother    Heart disease Brother    Colon cancer Neg Hx    Stomach cancer Neg Hx    SOCIAL  HISTORY Social History   Tobacco Use   Smoking status: Never   Smokeless tobacco: Never  Vaping Use   Vaping Use: Never used  Substance Use Topics   Alcohol use: No    Comment: social drinker.   Drug use: No       OPHTHALMIC EXAM:  Base Eye Exam     Visual Acuity (Snellen - Linear)       Right Left   Dist [place] 20/25 +2 20/20   Dist ph [place] 20/20 -1  Tonometry (Tonopen, 1:52 PM)       Right Left   Pressure 8 9         Pupils       Dark Light Shape React APD   Right 2 1 Round Brisk None   Left 2 1 Round Brisk None         Visual Fields       Left Right    Full Full         Extraocular Movement       Right Left    Full, Ortho Full, Ortho         Neuro/Psych     Oriented x3: Yes   Mood/Affect: Normal         Dilation     Both eyes: 1.0% Mydriacyl, 2.5% Phenylephrine @ 1:49 PM           Slit Lamp and Fundus Exam     Slit Lamp Exam       Right Left   Lids/Lashes Dermatochalasis - upper lid Dermatochalasis - upper lid   Conjunctiva/Sclera temporal pinguecula temporal pinguecula   Cornea Trace Punctate epithelial erosions trace PEE   Anterior Chamber Deep and quiet Deep and quiet   Iris Round and dilated, No NVI Round and dilated, No NVI   Lens 2+ Nuclear sclerosis, 2+ Cortical cataract 2+ Nuclear sclerosis, 2+ Cortical cataract   Anterior Vitreous Vitreous syneresis Vitreous syneresis         Fundus Exam       Right Left   Disc Pink and Sharp Pink and Sharp, +cupping   C/D Ratio 0.6 0.5   Macula Blunted foveal reflex, Drusen, RPE mottling, focal MA nasal to fovea, no edema Blunted foveal reflex, Drusen, RPE mottling and clumping, no heme or edema   Vessels attenuated, Tortuous attenuated, Tortuous   Periphery Attached, scattered MA/DBH, scattered peripheral drusen, mild patches of pigmented lattice from 1000 to 1100 superiorly - good laser surrounding, and 0430-0700 inferiorly - good laser surrounding, elevated  choroidal lesion at 1230 equator -- collapses under scleral depresson, pigmented paving stone degeneration inferiorly, no new RT/RD/lattice Attached, mild reticular degeneration, peripheral drusen, mild lattice at 0100 - good laser surrounding, pigmented paving stone degeneration inferiorly, scattered MA / DBH, punctate VR tuft at 1030 - good laser surrounding; scattered peripheral cystoid degeneratoin           IMAGING AND PROCEDURES  Imaging and Procedures for 04/01/2022  OCT, Retina - OU - Both Eyes       Right Eye Quality was good. Central Foveal Thickness: 288. Progression has been stable. Findings include normal foveal contour, no IRF, no SRF, retinal drusen , vitreomacular adhesion (Hyporeflective elevated choroidal lesion superior periphery caught on widefield ).   Left Eye Quality was good. Central Foveal Thickness: 290. Progression has been stable. Findings include normal foveal contour, no IRF, no SRF, retinal drusen , vitreomacular adhesion .   Notes *Images captured and stored on drive  Diagnosis / Impression:  NFP; no IRF/SRF OU +drusen OU OD: Hyporeflective elevated choroidal lesion superior periphery caught on widefield -- stable  Clinical management:  See below  Abbreviations: NFP - Normal foveal profile. CME - cystoid macular edema. PED - pigment epithelial detachment. IRF - intraretinal fluid. SRF - subretinal fluid. EZ - ellipsoid zone. ERM - epiretinal membrane. ORA - outer retinal atrophy. ORT - outer retinal tubulation. SRHM - subretinal hyper-reflective material. IRHM - intraretinal hyper-reflective material  ASSESSMENT/PLAN:    ICD-10-CM   1. 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 Choroidal lesion  H31.8     3. Bilateral retinal lattice degeneration  H35.413     4. Bilateral retinal defect  H33.303     5. Essential hypertension  I10     6.  Hypertensive retinopathy of both eyes  H35.033     7. Diabetes mellitus type 2 without retinopathy (HCC)  E11.9     8. Combined forms of age-related cataract of both eyes  H25.813      Age related macular degeneration, non-exudative, both eyes - early stage OU  - The incidence, anatomy, and pathology of dry AMD, risk of progression, and the AREDS and AREDS 2 study including smoking risks discussed with patient.  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 Lattice degeneration OU - OD: mild patches of pigmented lattice from 1000-1100 superiorly and 0430-0700 inferiorly - OS: mild lattice at 0100, VR tuft at 1030 - s/p laser retinopexy OD (06.20.23) -- good laser changes in place - s/p laser retinopexy OS (07.10.23) -- good laser changes in place - f/u in 3-4 months, sooner prn  5,6. Hypertensive retinopathy OU - discussed importance of tight BP control - monitor  7. Diabetes mellitus, type 2 without retinopathy - The incidence, risk factors for progression, natural history and treatment options for diabetic retinopathy  were discussed with patient.   - The need for close monitoring of blood glucose, blood pressure, and serum lipids, avoiding cigarette or any type of tobacco, and the need for long term follow up was also discussed with patient. - f/u in 1 year, sooner prn  8. Mixed Cataract OU - The symptoms of cataract, surgical options, and treatments and risks were discussed with patient. - discussed diagnosis and progression - under the expert management of Dr.  Zenaida Niece  Ophthalmic Meds Ordered this visit:  No orders of the defined types were placed in this encounter.    Return for f/u 3-4 months, lattice degeneration OU, DFE, OCT.  There are no Patient Instructions on file for this visit.   Explained the diagnoses, plan, and follow up with the patient and they expressed understanding.  Patient expressed understanding of the importance of proper follow up care.   This document serves as a record of services personally performed by Karie Chimera, MD, PhD. It was created on their behalf by Annalee Genta, COMT. The creation of this record is the provider's dictation and/or activities during the visit.  Electronically signed by: Annalee Genta, COMT 04/01/22 4:09 PM  This document serves as a record of services personally performed by Karie Chimera, MD, PhD. It was created on their behalf by Glee Arvin. Manson Passey, OA an ophthalmic technician. The creation of this record is the provider's dictation and/or activities during the visit.    Electronically signed by: Glee Arvin. [place], [place] 07.31.2023 4:09 PM   Karie Chimera, M.D., Ph.D. Diseases & Surgery of the Retina and Vitreous [facility]  I have reviewed the above documentation for accuracy and completeness, and I agree with the above. Karie Chimera, M.D., Ph.D. 04/01/22 4:09 PM   Abbreviations: M myopia (nearsighted); A astigmatism; H hyperopia (farsighted); P presbyopia; Mrx spectacle prescription;  CTL contact lenses; OD right eye; OS left eye; OU both eyes  XT exotropia; ET esotropia; PEK punctate epithelial keratitis; PEE punctate epithelial erosions; DES dry eye syndrome; MGD meibomian gland dysfunction; ATs artificial tears; PFAT's preservative free artificial tears; NSC nuclear sclerotic cataract; PSC posterior subcapsular cataract; ERM epi-retinal membrane; PVD posterior vitreous detachment; RD retinal detachment; DM diabetes mellitus; DR diabetic retinopathy; NPDR  non-proliferative diabetic retinopathy; PDR proliferative diabetic retinopathy; CSME clinically significant macular edema; DME diabetic macular edema; dbh dot blot hemorrhages; CWS cotton wool spot; POAG primary open angle glaucoma; C/D cup-to-disc ratio; HVF humphrey visual field; GVF goldmann visual field; OCT optical coherence tomography; IOP intraocular pressure; BRVO Branch retinal vein occlusion; CRVO central retinal vein occlusion; CRAO central retinal artery occlusion; BRAO branch retinal artery occlusion; RT retinal tear; SB scleral buckle; PPV pars plana vitrectomy; VH Vitreous hemorrhage; PRP panretinal laser photocoagulation; IVK intravitreal kenalog; VMT vitreomacular traction; MH Macular hole;  NVD neovascularization of the disc; NVE neovascularization elsewhere; AREDS age related eye disease study; ARMD age related macular degeneration; POAG primary open angle glaucoma; EBMD epithelial/anterior basement membrane dystrophy; ACIOL anterior chamber intraocular lens; IOL intraocular lens; PCIOL posterior chamber intraocular lens; Phaco/IOL phacoemulsification with intraocular lens placement; PRK photorefractive keratectomy; LASIK laser assisted in situ keratomileusis; HTN hypertension; DM diabetes mellitus; COPD chronic obstructive pulmonary disease

## 2022-04-01 ENCOUNTER — Encounter (INDEPENDENT_AMBULATORY_CARE_PROVIDER_SITE_OTHER): Payer: Self-pay | Admitting: Ophthalmology

## 2022-04-01 ENCOUNTER — Ambulatory Visit (INDEPENDENT_AMBULATORY_CARE_PROVIDER_SITE_OTHER): Payer: Medicare HMO | Admitting: Ophthalmology

## 2022-04-01 DIAGNOSIS — H353131 Nonexudative age-related macular degeneration, bilateral, early dry stage: Secondary | ICD-10-CM

## 2022-04-01 DIAGNOSIS — H33303 Unspecified retinal break, bilateral: Secondary | ICD-10-CM

## 2022-04-01 DIAGNOSIS — H35413 Lattice degeneration of retina, bilateral: Secondary | ICD-10-CM

## 2022-04-01 DIAGNOSIS — H35033 Hypertensive retinopathy, bilateral: Secondary | ICD-10-CM | POA: Diagnosis not present

## 2022-04-01 DIAGNOSIS — H25813 Combined forms of age-related cataract, bilateral: Secondary | ICD-10-CM | POA: Diagnosis not present

## 2022-04-01 DIAGNOSIS — H318 Other specified disorders of choroid: Secondary | ICD-10-CM | POA: Diagnosis not present

## 2022-04-01 DIAGNOSIS — I10 Essential (primary) hypertension: Secondary | ICD-10-CM

## 2022-04-01 DIAGNOSIS — E119 Type 2 diabetes mellitus without complications: Secondary | ICD-10-CM

## 2022-04-04 ENCOUNTER — Other Ambulatory Visit: Payer: Self-pay | Admitting: Family Medicine

## 2022-04-04 NOTE — Telephone Encounter (Signed)
Requested Prescriptions  Pending Prescriptions Disp Refills  . pantoprazole (PROTONIX) 40 MG tablet [Pharmacy Med Name: PANTOPRAZOLE SODIUM 40 MG Tablet Delayed Release] 180 tablet 2    Sig: TAKE 1 TABLET (40 MG TOTAL) BY MOUTH 2 (TWO) TIMES DAILY BEFORE  MEALS     Gastroenterology: Proton Pump Inhibitors Passed - 04/04/2022  2:54 AM      Passed - Valid encounter within last 12 months    Recent Outpatient Visits          6 months ago Type 2 diabetes mellitus with complications (HCC)   [facility] Medicine Pickard, Priscille Heidelberg, MD   10 months ago Fever, unspecified fever cause   [facility] Medicine Valentino Nose, NP   1 year ago Type 2 diabetes mellitus with complications (HCC)   [facility] Medicine Donita Brooks, MD   1 year ago Type 2 diabetes mellitus with complications (HCC)   [facility] Medicine Donita Brooks, MD   2 years ago Type 2 diabetes mellitus with complications (HCC)   [facility] Family Medicine Pickard, Priscille Heidelberg, MD      Future Appointments            In 5 months Pickard, Priscille Heidelberg, MD [facility] Family [facility], [facility]

## 2022-04-18 ENCOUNTER — Other Ambulatory Visit: Payer: Self-pay | Admitting: Family Medicine

## 2022-04-18 NOTE — Telephone Encounter (Signed)
Requested medication (s) are due for refill today: expired medication  Requested medication (s) are on the active medication list: yes  Last refill:  03/30/21 #90 3 refills  Future visit scheduled: yes in 5 months  Notes to clinic:  expired medication, do you want to renew Rx?     Requested Prescriptions  Pending Prescriptions Disp Refills   FARXIGA 10 MG TABS tablet [Pharmacy Med Name: FARXIGA 10 MG Tablet] 90 tablet 3    Sig: TAKE 1 TABLET EVERY [place]     Endocrinology:  Diabetes - SGLT2 Inhibitors Failed - 04/18/2022  2:50 AM      Failed - eGFR in normal range and within 360 days    GFR, Est African American  Date Value Ref Range Status  09/25/2020 69 > OR = 60 mL/min/1.48m Final   GFR, Est Non African American  Date Value Ref Range Status  09/25/2020 59 (L) > OR = 60 mL/min/1.782mFinal   eGFR  Date Value Ref Range Status  03/20/2021 67 > OR = 60 mL/min/1.7358minal    Comment:    The eGFR is based on the CKD-EPI 2021 equation. To calculate  the new eGFR from a previous Creatinine or Cystatin C result, go to https://www.kidney.org/professionals/ kdoqi/gfr%5Fcalculator          Failed - Valid encounter within last 6 months    Recent Outpatient Visits           6 months ago Type 2 diabetes mellitus with complications ([place]CC[place], WarCammie McgeeD   11 months ago Fever, unspecified fever cause   BroSarahsvillerEulogio BearP   1 year ago Type 2 diabetes mellitus with complications ([place]CC[place] BroDennisoncSusy FrizzleD   1 year ago Type 2 diabetes mellitus with complications [facility] Pic[facility]y FrizzleD   2 years ago Type 2 diabetes mellitus with complications ([place]CC[place] BroChesterhillckard, WarCammie McgeeD       Future Appointments             In 5 months Pickard, WarCammie McgeeD BroMagnoliaEC             Passed - Cr in normal range and within 360 days    Creat  Date Value Ref Range Status  03/18/2022 1.02 0.50 - 1.05 mg/dL Final         Passed - HBA1C is between 0 and 7.9 and within 180 days    Hgb A1c MFr Bld  Date Value Ref Range Status  03/18/2022 6.3 (H) <5.7 % of total Hgb Final    Comment:    For someone without known diabetes, a hemoglobin  A1c value between 5.7% and 6.4% is consistent with prediabetes and should be confirmed with a  follow-up test. . For someone with known diabetes, a value <7% indicates that their diabetes is well controlled. A1c targets should be individualized based on duration of diabetes, age, comorbid conditions, and other considerations. . This assay result is consistent with an increased risk of diabetes. . Currently, no consensus exists regarding use of hemoglobin A1c for diagnosis of diabetes for children. .Marland Kitchen

## 2022-05-30 ENCOUNTER — Telehealth: Payer: Self-pay | Admitting: Family Medicine

## 2022-05-30 NOTE — Telephone Encounter (Signed)
Patient called to follow up on last visit; stated provider spoke with her about getting an xray to follow up on L sided sciatica. Patient never went to have xrays done after LOV on .   Patient requesting for referral for xray be sent to [facility].  Please advise at [phone].

## 2022-05-31 ENCOUNTER — Other Ambulatory Visit: Payer: Self-pay | Admitting: Family Medicine

## 2022-05-31 DIAGNOSIS — M5431 Sciatica, right side: Secondary | ICD-10-CM

## 2022-05-31 NOTE — Telephone Encounter (Signed)
Left message to return call; need to inform patient x-ray order has been sent to [facility].

## 2022-06-04 ENCOUNTER — Other Ambulatory Visit: Payer: Self-pay | Admitting: Cardiovascular Disease

## 2022-06-18 NOTE — Progress Notes (Signed)
[facility] - Clinic Note  07/02/2022     CHIEF COMPLAINT Patient presents for Retina Follow Up   HISTORY OF PRESENT ILLNESS: Linda Briggs is a 66 y.o. female who presents to the clinic today for:   HPI     Retina Follow Up   Patient presents with  Dry AMD.  In both eyes.  This started 3 months ago.  I, the attending physician,  performed the HPI with the patient and updated documentation appropriately.        Comments   Patient here for 3 months retina follow up for non exu ARMD OU. Patient states vision doing pretty good. 2 weeks ago OD was hurting like when had surgery. Took ibuprofen. Then 2 days later felt like a bruise. Lasted that day. Next day was fine. OS is doing good. A1C went from 6.8 to 6.2.      Last edited by Rennis Chris, MD on 07/03/2022 12:10 AM.    Pt states about 2 weeks ago she had pain behind her right eye, she states it was so bad, she was going to call and see if she could come in early, she took some ibuprofen, which helped the symptoms, she states a couple days later, it felt like it was bruised, all symptoms have resolved now  Referring physician: Donita Brooks, MD [address] [place],  [place] [address]  HISTORICAL INFORMATION:   Selected notes from the MEDICAL RECORD NUMBER Referred by Dr. Zenaida Niece for retinal eval LEE:  Ocular Hx- PMH-    CURRENT MEDICATIONS: No current outpatient medications on file. (Ophthalmic Drugs)   No current facility-administered medications for this visit. (Ophthalmic Drugs)   Current Outpatient Medications (Other)  Medication Sig   Accu-Chek Softclix Lancets lancets TEST BLOOD SUGAR THREE TIMES DAILY   Alcohol Swabs (DROPSAFE ALCOHOL PREP) 70 % PADS USE THREE TIMES DAILY   aspirin EC 81 MG tablet Take 81 mg by mouth daily.   Blood Glucose Calibration (TRUE METRIX LEVEL 2) Normal SOLN Use as directed to monitor FSBS 3x daily. Dx: E11.65   Blood Glucose Monitoring Suppl (TRUE  METRIX AIR GLUCOSE METER) DEVI 1 each by Does not apply route daily. Use as directed to monitor FSBS 3x daily. Dx: E11.65   FARXIGA 10 MG TABS tablet TAKE 1 TABLET EVERY DAY BEFORE BREAKFAST   furosemide (LASIX) 20 MG tablet TAKE 1 TABLET EVERY DAY   gabapentin (NEURONTIN) 300 MG capsule TAKE 1 CAPSULE THREE TIMES DAILY   glucose blood (ACCU-CHEK GUIDE) test strip TEST BLOOD SUGAR THREE TIMES DAILY.   metFORMIN (GLUCOPHAGE) 1000 MG tablet TAKE 1 TABLET TWICE DAILY WITH MEALS   metoprolol tartrate (LOPRESSOR) 50 MG tablet Take 0.5 tablets (25 mg total) by mouth 2 (two) times daily.   Multiple Vitamin (MULTIVITAMIN) tablet Take 1 tablet by mouth daily.   ondansetron (ZOFRAN) 4 MG tablet TAKE 1 TABLET (4 MG TOTAL) BY MOUTH EVERY 8 (EIGHT) HOURS AS NEEDED FOR NAUSEA OR VOMITING.   pantoprazole (PROTONIX) 40 MG tablet TAKE 1 TABLET (40 MG TOTAL) BY MOUTH 2 (TWO) TIMES DAILY BEFORE  MEALS   potassium chloride (KLOR-CON M) 10 MEQ tablet TAKE 1 TABLET EVERY DAY   pravastatin (PRAVACHOL) 40 MG tablet Take 1 tablet (40 mg total) by mouth daily.   predniSONE (DELTASONE) 20 MG tablet 3 tabs poqday 1-2, 2 tabs poqday 3-4, 1 tab poqday 5-6   traMADol (ULTRAM) 50 MG tablet TAKE 1 TABLET BY  MOUTH EVERY 6 HOURS AS NEEDED FOR  CHRONIC  BACK  PAIN   TRULICITY 1.5 MG/0.5ML SOPN INJECT 1.5MG  (1 PEN) SUBCUTANEOUSLY EVERY WEEK AS DIRECTED   lisinopril (ZESTRIL) 5 MG tablet TAKE 1 TABLET (5 MG TOTAL) BY MOUTH DAILY. (Patient not taking: Reported on 03/25/2022)   No current facility-administered medications for this visit. (Other)   REVIEW OF SYSTEMS: ROS   Positive for: Endocrine, Eyes Negative for: Constitutional, Gastrointestinal, Neurological, Skin, Genitourinary, Musculoskeletal, HENT, Cardiovascular, Respiratory, Psychiatric, Allergic/Imm, Heme/Lymph Last edited by Laddie Aquas, COA on 07/02/2022  2:11 PM.     ALLERGIES Allergies  Allergen Reactions   Codeine Anaphylaxis and Hives   Penicillins  Anaphylaxis, Hives and Other (See Comments)    Has patient had a PCN reaction causing immediate rash, facial/tongue/throat swelling, SOB or lightheadedness with hypotension: No Has patient had a PCN reaction causing severe rash involving mucus membranes or skin necrosis: No Has patient had a PCN reaction that required hospitalization: No Has patient had a PCN reaction occurring within the last 10 years: No If all of the above answers are "NO", then may proceed with Cephalosporin use.    Ciprofloxacin Other (See Comments)    Tongue Swelling   Food Nausea And Vomiting and Other (See Comments)    Olives    Januvia [Sitagliptin] Other (See Comments)    Stomach cramps   Latex Hives   PAST MEDICAL HISTORY Past Medical History:  Diagnosis Date   Arthritis    feet & toes   Bulging disc    Diabetes mellitus    Gastroparesis    History of hepatitis B    Hyperlipidemia    Hypertension    Neuromuscular disorder (HCC)    in feet and fingers    Neuropathy    feet   Nonproliferative diabetic retinopathy (HCC)    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    Peripheral vascular disease (HCC)    Shortness of breath dyspnea    on lopressor    Sleep apnea    pt had study done but it was incomplete due to blood sugar issues    Past Surgical History:  Procedure Laterality Date   BIOPSY  04/28/2018   Procedure: BIOPSY;  Surgeon: Meryl Dare, MD;  Location: [facility];  Service: Endoscopy;;   BIOPSY THYROID     january 2016   ESOPHAGOGASTRODUODENOSCOPY (EGD) WITH PROPOFOL N/A 04/28/2018   Procedure: ESOPHAGOGASTRODUODENOSCOPY (EGD) WITH PROPOFOL;  Surgeon: Meryl Dare, MD;  Location: [facility];  Service: Endoscopy;  Laterality: N/A;   HYSTEROSCOPY WITH D & C N/A 02/15/2015   Procedure: DILATATION AND CURETTAGE /HYSTEROSCOPY;  Surgeon: Mitchel Honour, DO;  Location: [facility];  Service: Gynecology;  Laterality: N/A;   TONSILLECTOMY     FAMILY HISTORY Family History  Problem Relation  Age of Onset   Hyperlipidemia Mother    Heart disease Father    Hyperlipidemia Father    Hypertension Father    Heart disease Brother    Kidney disease Sister    Kidney cancer Sister    Ovarian cancer Maternal Aunt    Kidney disease Maternal Aunt        x 2   Kidney disease Maternal Uncle        x 2   Stroke Brother    Heart disease Brother    Colon cancer Neg Hx    Stomach cancer Neg Hx    SOCIAL HISTORY Social History   Tobacco Use  Smoking status: Never   Smokeless tobacco: Never  Vaping Use   Vaping Use: Never used  Substance Use Topics   Alcohol use: No    Comment: social drinker.   Drug use: No       OPHTHALMIC EXAM:  Base Eye Exam     Visual Acuity (Snellen - Linear)       Right Left   Dist [address] 20/25 20/20   Dist ph [place] 20/20          Tonometry (Tonopen, 2:07 PM)       Right Left   Pressure 17 15         Pupils       Dark Light Shape React APD   Right 2 1 Round Brisk None   Left 2 1 Round Brisk None         Visual Fields (Counting fingers)       Left Right    Full Full         Extraocular Movement       Right Left    Full, Ortho Full, Ortho         Neuro/Psych     Oriented x3: Yes   Mood/Affect: Normal         Dilation     Both eyes: 1.0% Mydriacyl, 2.5% Phenylephrine @ 2:07 PM           Slit Lamp and Fundus Exam     Slit Lamp Exam       Right Left   Lids/Lashes Dermatochalasis - upper lid Dermatochalasis - upper lid   Conjunctiva/Sclera temporal pinguecula temporal pinguecula   Cornea 1+ inferior Punctate epithelial erosions, mild tear film debris 1+ inferior Punctate epithelial erosions, mild tear film debris   Anterior Chamber deep and clear deep and clear   Iris Round and dilated, No NVI Round and dilated, No NVI   Lens 2-3+ Nuclear sclerosis, 2-3+ Cortical cataract 2-3+ Nuclear sclerosis, 2-3+ Cortical cataract   Anterior Vitreous Vitreous syneresis Vitreous syneresis         Fundus Exam        Right Left   Disc Pink and Sharp Pink and Sharp, +cupping   C/D Ratio 0.6 0.5   Macula Blunted foveal reflex, Drusen, RPE mottling, focal MA nasal to fovea, no edema Blunted foveal reflex, Drusen, RPE mottling and clumping, no heme or edema   Vessels mild attenuation, mild tortuosity attenuated, Tortuous, copper wiring   Periphery Attached, scattered MA/DBH, scattered peripheral drusen, mild patches of pigmented lattice from 1000 to 1100 superiorly - good laser surrounding, and 0430-0700 inferiorly - good laser surrounding, elevated choroidal lesion at 1230 equator -- collapses under scleral depression -- vortex vein varix - stable, pigmented paving stone degeneration inferiorly, no new RT/RD/lattice, focal blot heme at 1000 equator Attached, mild reticular degeneration, peripheral drusen, mild lattice at 0100 - good laser surrounding, pigmented paving stone degeneration inferiorly, scattered IRH / DBH temporal and inferior periphery, punctate VR tuft at 1030 - good laser surrounding; scattered peripheral cystoid degeneration, no new RT/RD/lattice           IMAGING AND PROCEDURES  Imaging and Procedures for 07/02/2022  OCT, Retina - OU - Both Eyes       Right Eye Quality was good. Central Foveal Thickness: 268. Progression has been stable. Findings include normal foveal contour, no IRF, no SRF, retinal drusen , vitreomacular adhesion (Hyporeflective elevated choroidal lesion superior periphery caught on widefield -- stable).   Left Eye  Quality was good. Central Foveal Thickness: 277. Progression has been stable. Findings include normal foveal contour, no IRF, no SRF, retinal drusen , vitreomacular adhesion .   Notes *Images captured and stored on drive  Diagnosis / Impression:  NFP; no IRF/SRF OU +drusen OU OD: Hyporeflective elevated choroidal lesion superior periphery caught on widefield -- stable  Clinical management:  See below  Abbreviations: NFP - Normal foveal profile. CME -  cystoid macular edema. PED - pigment epithelial detachment. IRF - intraretinal fluid. SRF - subretinal fluid. EZ - ellipsoid zone. ERM - epiretinal membrane. ORA - outer retinal atrophy. ORT - outer retinal tubulation. SRHM - subretinal hyper-reflective material. IRHM - intraretinal hyper-reflective material            ASSESSMENT/PLAN:    ICD-10-CM   1. 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 Choroidal lesion  H31.8     3. Bilateral retinal lattice degeneration  H35.413     4. Bilateral retinal defect  H33.303     5. Essential hypertension  I10     6. Hypertensive retinopathy of both eyes  H35.033     7. Diabetes mellitus type 2 without retinopathy (HCC)  E11.9     8. Combined forms of age-related cataract of both eyes  H25.813       Age related macular degeneration, non-exudative, both eyes - early stage OU -- stable  - The incidence, anatomy, and pathology of dry AMD, risk of progression, and the AREDS and AREDS 2 study including smoking risks discussed with patient.  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 Lattice degeneration OU - OD: mild patches of pigmented lattice from 1000-1100 superiorly and 0430-0700 inferiorly - OS: mild lattice at 0100, VR tuft at 1030 - s/p laser retinopexy OD (06.20.23) -- good laser changes in place - s/p laser retinopexy OS (07.10.23) -- good laser changes in place - no new RT/RD or lattice OU - f/u in 6-9 months, sooner  prn  5,6. Hypertensive retinopathy OU - discussed importance of tight BP control - monitor  7. Diabetes mellitus, type 2 without retinopathy - The incidence, risk factors for progression, natural history and treatment options for diabetic retinopathy  were discussed with patient.   - The need for close monitoring of blood glucose, blood pressure, and serum lipids, avoiding cigarette or any type of tobacco, and the need for long term follow up was also discussed with patient. - f/u in 1 year, sooner prn  8. Mixed Cataract OU - The symptoms of cataract, surgical options, and treatments and risks were discussed with patient. - discussed diagnosis and progression - under the expert management of Dr. Zenaida NieceVan  Ophthalmic Meds Ordered this visit:  No orders of the defined types were placed in this encounter.    Return for f/u 6-9 months, non-exu ARMD OU, DFE, OCT.  There are no Patient Instructions on file for this visit.   Explained the diagnoses, plan, and follow up with the patient and they  expressed understanding.  Patient expressed understanding of the importance of proper follow up care.   This document serves as a record of services personally performed by Karie Chimera, MD, PhD. It was created on their behalf by Gerilyn Nestle, COT an ophthalmic technician. The creation of this record is the provider's dictation and/or activities during the visit.    Electronically signed by:  Gerilyn Nestle, COT  10.17.23 12:11 AM  This document serves as a record of services personally performed by Karie Chimera, MD, PhD. It was created on their behalf by Glee Arvin. Manson Passey, OA an ophthalmic technician. The creation of this record is the provider's dictation and/or activities during the visit.    Electronically signed by: Glee Arvin. Manson Passey, [place] 10.31.2023 12:11 AM  Karie Chimera, M.D., Ph.D. Diseases & Surgery of the Retina and Vitreous [facility]  I have reviewed  the above documentation for accuracy and completeness, and I agree with the above. Karie Chimera, M.D., Ph.D. 07/03/22 12:12 AM   Abbreviations: M myopia (nearsighted); A astigmatism; H hyperopia (farsighted); P presbyopia; Mrx spectacle prescription;  CTL contact lenses; OD right eye; OS left eye; OU both eyes  XT exotropia; ET esotropia; PEK punctate epithelial keratitis; PEE punctate epithelial erosions; DES dry eye syndrome; MGD meibomian gland dysfunction; ATs artificial tears; PFAT's preservative free artificial tears; NSC nuclear sclerotic cataract; PSC posterior subcapsular cataract; ERM epi-retinal membrane; PVD posterior vitreous detachment; RD retinal detachment; DM diabetes mellitus; DR diabetic retinopathy; NPDR non-proliferative diabetic retinopathy; PDR proliferative diabetic retinopathy; CSME clinically significant macular edema; DME diabetic macular edema; dbh dot blot hemorrhages; CWS cotton wool spot; POAG primary open angle glaucoma; C/D cup-to-disc ratio; HVF humphrey visual field; GVF goldmann visual field; OCT optical coherence tomography; IOP intraocular pressure; BRVO Branch retinal vein occlusion; CRVO central retinal vein occlusion; CRAO central retinal artery occlusion; BRAO branch retinal artery occlusion; RT retinal tear; SB scleral buckle; PPV pars plana vitrectomy; VH Vitreous hemorrhage; PRP panretinal laser photocoagulation; IVK intravitreal kenalog; VMT vitreomacular traction; MH Macular hole;  NVD neovascularization of the disc; NVE neovascularization elsewhere; AREDS age related eye disease study; ARMD age related macular degeneration; POAG primary open angle glaucoma; EBMD epithelial/anterior basement membrane dystrophy; ACIOL anterior chamber intraocular lens; IOL intraocular lens; PCIOL posterior chamber intraocular lens; Phaco/IOL phacoemulsification with intraocular lens placement; PRK photorefractive keratectomy; LASIK laser assisted in situ keratomileusis; HTN  hypertension; DM diabetes mellitus; COPD chronic obstructive pulmonary disease

## 2022-07-02 ENCOUNTER — Encounter (INDEPENDENT_AMBULATORY_CARE_PROVIDER_SITE_OTHER): Payer: Self-pay | Admitting: Ophthalmology

## 2022-07-02 ENCOUNTER — Ambulatory Visit (INDEPENDENT_AMBULATORY_CARE_PROVIDER_SITE_OTHER): Payer: Medicare HMO | Admitting: Ophthalmology

## 2022-07-02 DIAGNOSIS — H353131 Nonexudative age-related macular degeneration, bilateral, early dry stage: Secondary | ICD-10-CM

## 2022-07-02 DIAGNOSIS — H25813 Combined forms of age-related cataract, bilateral: Secondary | ICD-10-CM

## 2022-07-02 DIAGNOSIS — I10 Essential (primary) hypertension: Secondary | ICD-10-CM

## 2022-07-02 DIAGNOSIS — H318 Other specified disorders of choroid: Secondary | ICD-10-CM | POA: Diagnosis not present

## 2022-07-02 DIAGNOSIS — E119 Type 2 diabetes mellitus without complications: Secondary | ICD-10-CM

## 2022-07-02 DIAGNOSIS — H33303 Unspecified retinal break, bilateral: Secondary | ICD-10-CM

## 2022-07-02 DIAGNOSIS — H35033 Hypertensive retinopathy, bilateral: Secondary | ICD-10-CM | POA: Diagnosis not present

## 2022-07-02 DIAGNOSIS — H35413 Lattice degeneration of retina, bilateral: Secondary | ICD-10-CM | POA: Diagnosis not present

## 2022-07-03 ENCOUNTER — Encounter (INDEPENDENT_AMBULATORY_CARE_PROVIDER_SITE_OTHER): Payer: Self-pay | Admitting: Ophthalmology

## 2022-07-10 ENCOUNTER — Telehealth: Payer: Self-pay | Admitting: Family Medicine

## 2022-07-10 NOTE — Telephone Encounter (Signed)
Left message to return call; need to reschedule Medicare AWV appointment currently scheduled for 08/15/22 - appointment template for [facility] changed.

## 2022-08-14 ENCOUNTER — Ambulatory Visit (INDEPENDENT_AMBULATORY_CARE_PROVIDER_SITE_OTHER): Payer: Medicare HMO

## 2022-08-14 VITALS — Ht 66.0 in | Wt 277.0 lb

## 2022-08-14 DIAGNOSIS — Z780 Asymptomatic menopausal state: Secondary | ICD-10-CM | POA: Diagnosis not present

## 2022-08-14 DIAGNOSIS — Z0000 Encounter for general adult medical examination without abnormal findings: Secondary | ICD-10-CM | POA: Diagnosis not present

## 2022-08-14 DIAGNOSIS — Z1231 Encounter for screening mammogram for malignant neoplasm of breast: Secondary | ICD-10-CM | POA: Diagnosis not present

## 2022-08-14 NOTE — Progress Notes (Signed)
Subjective:   Linda Briggs is a 66 y.o. female who presents for Medicare Annual (Subsequent) preventive examination. Virtual Visit via Telephone Note  I connected with  Linda Briggs on 08/14/22 at  9:00 AM EST by telephone and verified that I am speaking with the correct person using two identifiers.  Location: Patient: HOME Provider: [facility] Persons participating in the virtual visit: patient/Nurse Health Advisor   I discussed the limitations, risks, security and privacy concerns of performing an evaluation and management service by telephone and the availability of in person appointments. The patient expressed understanding and agreed to proceed.  Interactive audio and video telecommunications were attempted between this nurse and patient, however failed, due to patient having technical difficulties OR patient did not have access to video capability.  We continued and completed visit with audio only.  Some vital signs may be absent or patient reported.   Chriss Driver, LPN  Review of Systems     Cardiac Risk Factors include: advanced age (>49mn, >>16women);diabetes mellitus;dyslipidemia;hypertension;sedentary lifestyle;obesity (BMI >30kg/m2)     Objective:    Today's Vitals   08/14/22 0912  Weight: 277 lb (125.6 kg)  Height: _0  (1.676 m)   Body mass index is 44.71 kg/m.     08/14/2022    9:33 AM 08/09/2021    2:19 PM 08/05/2020    1:15 PM 04/28/2018    8:35 AM 02/09/2015   11:45 AM 10/19/2014    8:35 PM  Advanced Directives  Does Patient Have a Medical Advance Directive? _1  Yes  Type of AParamedicof [place]Living will [place]Living will Healthcare Power of [place]ornwells HeightsLiving will Healthcare Power of [place]Living will;Advance instruction for mental health treatment  Does patient want to make changes to medical advance directive?     No -  Patient declined No - Patient declined  Copy of [place] Chart? No - copy requested No - copy requested  Yes No - copy requested No - copy requested  Would patient like information on creating a medical advance directive?  No - Patient declined        Current Medications (verified) Outpatient Encounter Medications as of 08/14/2022  Medication Sig   Accu-Chek Softclix Lancets lancets TEST BLOOD SUGAR THREE TIMES DAILY   Alcohol Swabs (DROPSAFE ALCOHOL PREP) 70 % PADS USE THREE TIMES DAILY   aspirin EC 81 MG tablet Take 81 mg by mouth daily.   Blood Glucose Calibration (TRUE METRIX LEVEL 2) Normal SOLN Use as directed to monitor FSBS 3x daily. Dx: E11.65   Blood Glucose Monitoring Suppl (TRUE METRIX AIR GLUCOSE METER) DEVI 1 each by Does not apply route daily. Use as directed to monitor FSBS 3x daily. Dx: E11.65   FARXIGA 10 MG TABS tablet TAKE 1 TABLET EVERY DAY BEFORE BREAKFAST   furosemide (LASIX) 20 MG tablet TAKE 1 TABLET EVERY DAY   gabapentin (NEURONTIN) 300 MG capsule TAKE 1 CAPSULE THREE TIMES DAILY   glucose blood (ACCU-CHEK GUIDE) test strip TEST BLOOD SUGAR THREE TIMES DAILY.   lisinopril (ZESTRIL) 5 MG tablet TAKE 1 TABLET (5 MG TOTAL) BY MOUTH DAILY.   metFORMIN (GLUCOPHAGE) 1000 MG tablet TAKE 1 TABLET TWICE DAILY WITH MEALS   metoprolol tartrate (LOPRESSOR) 50 MG tablet Take 0.5 tablets (25 mg total) by mouth 2 (two) times daily.   Multiple Vitamin (MULTIVITAMIN) tablet Take 1 tablet by mouth  daily.   ondansetron (ZOFRAN) 4 MG tablet TAKE 1 TABLET (4 MG TOTAL) BY MOUTH EVERY 8 (EIGHT) HOURS AS NEEDED FOR NAUSEA OR VOMITING.   pantoprazole (PROTONIX) 40 MG tablet TAKE 1 TABLET (40 MG TOTAL) BY MOUTH 2 (TWO) TIMES DAILY BEFORE  MEALS   potassium chloride (KLOR-CON M) 10 MEQ tablet TAKE 1 TABLET EVERY DAY   pravastatin (PRAVACHOL) 40 MG tablet Take 1 tablet (40 mg total) by mouth daily.   predniSONE (DELTASONE) 20 MG tablet 3 tabs poqday 1-2, 2 tabs poqday  3-4, 1 tab poqday 5-6   traMADol (ULTRAM) 50 MG tablet TAKE 1 TABLET BY MOUTH EVERY 6 HOURS AS NEEDED FOR  CHRONIC  BACK  PAIN   TRULICITY 1.5 DY/5.1GZ SOPN INJECT 1.5MG (1 PEN) SUBCUTANEOUSLY EVERY WEEK AS DIRECTED   No facility-administered encounter medications on file as of 08/14/2022.    Allergies (verified) Codeine, Penicillins, Ciprofloxacin, Food, Januvia [sitagliptin], and Latex   History: Past Medical History:  Diagnosis Date   Arthritis    feet & toes   Bulging disc    Diabetes mellitus    Gastroparesis    History of hepatitis B    Hyperlipidemia    Hypertension    Neuromuscular disorder ([place])    in feet and fingers    Neuropathy    feet   Nonproliferative diabetic retinopathy (HCC)    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    Peripheral vascular disease (HCC)    Shortness of breath dyspnea    on lopressor    Sleep apnea    pt had study done but it was incomplete due to blood sugar issues    Past Surgical History:  Procedure Laterality Date   BIOPSY  04/28/2018   Procedure: BIOPSY;  Surgeon: Ladene Artist, MD;  Location: [facility];  Service: Endoscopy;;   BIOPSY THYROID     january 2016   ESOPHAGOGASTRODUODENOSCOPY (EGD) WITH PROPOFOL N/A 04/28/2018   Procedure: ESOPHAGOGASTRODUODENOSCOPY (EGD) WITH PROPOFOL;  Surgeon: Ladene Artist, MD;  Location: [facility];  Service: Endoscopy;  Laterality: N/A;   HYSTEROSCOPY WITH D & C N/A 02/15/2015   Procedure: DILATATION AND CURETTAGE /HYSTEROSCOPY;  Surgeon: Linda Hedges, DO;  Location: [facility];  Service: Gynecology;  Laterality: N/A;   TONSILLECTOMY     Family History  Problem Relation Age of Onset   Hyperlipidemia Mother    Heart disease Father    Hyperlipidemia Father    Hypertension Father    Heart disease Brother    Kidney disease Sister    Kidney cancer Sister    Ovarian cancer Maternal Aunt    Kidney disease Maternal Aunt        x 2   Kidney disease Maternal Uncle        x 2   Stroke  Brother    Heart disease Brother    Colon cancer Neg Hx    Stomach cancer Neg Hx    Social History   Socioeconomic History   Marital status: Single    Spouse name: Not on file   Number of children: 0   Years of education: Not on file   Highest education level: Not on file  Occupational History   Occupation: retired  Tobacco Use   Smoking status: Never   Smokeless tobacco: Never  Vaping Use   Vaping Use: Never used  Substance and Sexual Activity   Alcohol use: No    Comment: social drinker.   Drug use: No  Sexual activity: Not Currently  Other Topics Concern   Not on file  Social History Narrative   Lives alone. 1 brother living.    Social Determinants of Health   Financial Resource Strain: Low Risk  (08/14/2022)   Overall Financial Resource Strain (CARDIA)    Difficulty of Paying Living Expenses: Not hard at all  Food Insecurity: No Food Insecurity (08/14/2022)   Hunger Vital Sign    Worried About Running Out of Food in the Last Year: Never true    Ran Out of Food in the Last Year: Never true  Transportation Needs: No Transportation Needs (08/14/2022)   PRAPARE - Hydrologist (Medical): No    Lack of Transportation (Non-Medical): No  Physical Activity: Insufficiently Active (08/14/2022)   Exercise Vital Sign    Days of Exercise per Week: 3 days    Minutes of Exercise per Session: 30 min  Stress: No Stress Concern Present (08/14/2022)   [place]    Feeling of Stress : Not at all  Social Connections: Moderately Integrated (08/14/2022)   Social Connection and Isolation Panel [NHANES]    Frequency of Communication with Friends and Family: More than three times a week    Frequency of Social Gatherings with Friends and Family: Three times a week    Attends Religious Services: 1 to 4 times per year    Active Member of Clubs or Organizations: Yes    Attends Programme researcher, broadcasting/film/video: More than 4 times per year    Marital Status: Never married    Tobacco Counseling Counseling given: Not Answered   Clinical Intake:  Pre-visit preparation completed: Yes  Pain : No/denies pain     BMI - recorded: 44.71 Nutritional Status: BMI > 30  Obese Nutritional Risks: None Diabetes: Yes CBG done?: No Did pt. bring in CBG monitor from home?: No  How often do you need to have someone help you when you read instructions, pamphlets, or other written materials from your doctor or pharmacy?: 1 - Never  Diabetic?Nutrition Risk Assessment:  Has the patient had any N/V/D within the last 2 months?  Yes  Does the patient have any non-healing wounds?  No  Has the patient had any unintentional weight loss or weight gain?  No   Diabetes:  Is the patient diabetic?  Yes  If diabetic, was a CBG obtained today?  No  Did the patient bring in their glucometer from home?  No  How often do you monitor your CBG's? Daily.   Financial Strains and Diabetes Management:  Are you having any financial strains with the device, your supplies or your medication? No .  Does the patient want to be seen by Chronic Care Management for management of their diabetes?  No  Would the patient like to be referred to a Nutritionist or for Diabetic Management?  No   Diabetic Exams:  Diabetic Eye Exam: Completed 2023 Dr. Coralyn Pear.   Pt has been advised about the importance in completing this exam. A referral has been placed today.  Diabetic Foot Exam: Completed DUE. Pt has been advised about the importance in completing this exam.  Interpreter Needed?: No  Information entered by :: mj Charnell Peplinski, lpn   Activities of Daily Living    08/14/2022    9:33 AM  In your present state of health, do you have any difficulty performing the following activities:  Hearing? 0  Vision? 0  Difficulty  concentrating or making decisions? 0  Walking or climbing stairs? 0  Dressing or bathing? 0   Doing errands, shopping? 0  Preparing Food and eating ? N  Using the Toilet? N  In the past six months, have you accidently leaked urine? N  Do you have problems with loss of bowel control? N  Comment at times due to Metformin per pt.  Managing your Medications? N  Managing your Finances? N  Housekeeping or managing your Housekeeping? N    Patient Care Team: Susy Frizzle, MD as PCP - General (Family Medicine) Minna Merritts, MD as PCP - Cardiology (Cardiology) Edythe Clarity, [facility] as Pharmacist (Pharmacist)  Indicate any recent Medical Services you may have received from other than [facility] providers in the past year (date may be approximate).     Assessment:   This is a routine wellness examination for Linda Briggs.  Hearing/Vision screen Hearing Screening - Comments:: No hearing issues.  Vision Screening - Comments:: Readers.   Dietary issues and exercise activities discussed: Current Exercise Habits: Home exercise routine;Structured exercise class, Type of exercise: walking;stretching;Other - see comments (Chair exercises at the [facility] Time (Minutes): 30, Frequency (Times/Week): 3, Weekly Exercise (Minutes/Week): 90, Exercise limited by: cardiac condition(s);orthopedic condition(s)   Goals Addressed             This Visit's Progress    DIET - INCREASE WATER INTAKE       Exercise 3x per week (30 min per time)   On track    Encouraged pt to return to [facility] to exercise.       Depression Screen    08/14/2022    9:22 AM 08/09/2021    2:11 PM 05/17/2021   12:42 PM 03/26/2021   11:20 AM 11/17/2017   11:36 AM 08/18/2017   11:32 AM 11/05/2016   11:19 AM  PHQ 2/9 Scores  PHQ - 2 Score 0 0 0 0 1 1 0  PHQ- 9 Score      4     Fall Risk    08/14/2022    9:34 AM 08/14/2022    9:33 AM 08/09/2021    2:21 PM 05/17/2021   12:41 PM 03/26/2021   11:20 AM  Fall Risk   Falls in the past year? 1 0 0 0 1  Number falls in past yr: 1 0 0 0 1  Injury with Fall?  1 0 0 0 0  Risk for fall due to : History of fall(s);Impaired balance/gait;Orthopedic patient No Fall Risks Impaired balance/gait;Impaired mobility  History of fall(s);Impaired mobility  Follow up Education provided;Falls prevention discussed Falls prevention discussed Falls prevention discussed Falls evaluation completed Falls evaluation completed    FALL RISK PREVENTION PERTAINING TO THE HOME:  Any stairs in or around the home? Yes  If so, are there any without handrails? No  Home free of loose throw rugs in walkways, pet beds, electrical cords, etc? Yes  Adequate lighting in your home to reduce risk of falls? Yes   ASSISTIVE DEVICES UTILIZED TO PREVENT FALLS:  Life alert? No  Use of a cane, walker or w/c? No  Grab bars in the bathroom? Yes  Shower chair or bench in shower? Yes  Elevated toilet seat or a handicapped toilet? Yes   TIMED UP AND GO:  Was the test performed? No .  PHONE VISIT  Cognitive Function:        08/14/2022    9:36 AM 08/09/2021    2:25 PM  6CIT  Screen  What Year? 0 points 0 points  What month? 0 points 0 points  What time? 0 points 0 points  Count back from 20 0 points 0 points  Months in reverse 0 points 0 points  Repeat phrase 0 points 0 points  Total Score 0 points 0 points    Immunizations Immunization History  Administered Date(s) Administered   Influenza,inj,Quad PF,6+ Mos 05/30/2014, 05/29/2015, 07/02/2017, 05/22/2018   Pneumococcal Conjugate-13 08/18/2017   Pneumococcal Polysaccharide-23 08/29/2014    TDAP status: Due, Education has been provided regarding the importance of this vaccine. Advised may receive this vaccine at local pharmacy or Health Dept. Aware to provide a copy of the vaccination record if obtained from local pharmacy or Health Dept. Verbalized acceptance and understanding.  Flu Vaccine status: Due, Education has been provided regarding the importance of this vaccine. Advised may receive this vaccine at local pharmacy  or Health Dept. Aware to provide a copy of the vaccination record if obtained from local pharmacy or Health Dept. Verbalized acceptance and understanding.  Pneumococcal vaccine status: Up to date  Covid-19 vaccine status: Declined, Education has been provided regarding the importance of this vaccine but patient still declined. Advised may receive this vaccine at local pharmacy or Health Dept.or vaccine clinic. Aware to provide a copy of the vaccination record if obtained from local pharmacy or Health Dept. Verbalized acceptance and understanding.  Qualifies for Shingles Vaccine? Yes   Zostavax completed No   Shingrix Completed?: No.    Education has been provided regarding the importance of this vaccine. Patient has been advised to call insurance company to determine out of pocket expense if they have not yet received this vaccine. Advised may also receive vaccine at local pharmacy or Health Dept. Verbalized acceptance and understanding.  Screening Tests Health Maintenance  Topic Date Due   MAMMOGRAM  02/18/2018   Diabetic kidney evaluation - Urine ACR  10/02/2022 (Originally 07/10/1974)   FOOT EXAM  10/02/2022 (Originally 03/26/2022)   Zoster Vaccines- Shingrix (1 of 2) 11/13/2022 (Originally [birth year]09/1976)   INFLUENZA VACCINE  12/01/2022 (Originally 04/02/2022)   Pneumonia Vaccine 59+ Years old (3 - PPSV23 or PCV20) 08/15/2023 (Originally 07/09/2022)   DEXA SCAN  08/15/2023 (Originally 07/09/2022)   COLONOSCOPY (Pts 45-30yr Insurance coverage will need to be confirmed)  08/15/2023 (Originally 07/09/2002)   PAP SMEAR-Modifier  09/02/2023 (Originally 02/19/2018)   HEMOGLOBIN A1C  09/18/2022   OPHTHALMOLOGY EXAM  12/14/2022   Diabetic kidney evaluation - eGFR measurement  03/19/2023   Medicare Annual Wellness (AWV)  08/15/2023   HPV VACCINES  Aged Out   DTaP/Tdap/Td  Discontinued   COVID-19 Vaccine  Discontinued   Hepatitis C Screening  Discontinued   HIV Screening  Discontinued    Health  Maintenance  Health Maintenance Due  Topic Date Due   MAMMOGRAM  02/18/2018    Colorectal cancer screening: Referral to GI placed PT DECLINED COLONOSCOPY AND COLOGUARD. Pt aware the office will call re: appt.  Mammogram status: Ordered 08/14/2022. Pt provided with contact info and advised to call to schedule appt.   Bone Density status: Ordered 08/14/2022. Pt provided with contact info and advised to call to schedule appt.  Lung Cancer Screening: (Low Dose CT Chest recommended if Age 65-80years, 30 pack-year currently smoking OR have quit w/in 15years.) does qualify.   Lung Cancer Screening Referral: N/A  Additional Screening:  Hepatitis C Screening: does qualify; Completed DUE  Vision Screening: Recommended annual ophthalmology exams for early detection of glaucoma and other disorders  of the eye. Is the patient up to date with their annual eye exam?  Yes  Who is the provider or what is the name of the office in which the patient attends annual eye exams? DR. Coralyn Pear If pt is not established with a provider, would they like to be referred to a provider to establish care? No .   Dental Screening: Recommended annual dental exams for proper oral hygiene  Community Resource Referral / Chronic Care Management: CRR required this visit?  No   CCM required this visit?  No      Plan:     I have personally reviewed and noted the following in the patient's chart:   Medical and social history Use of alcohol, tobacco or illicit drugs  Current medications and supplements including opioid prescriptions. Patient is not currently taking opioid prescriptions. Functional ability and status Nutritional status Physical activity Advanced directives List of other physicians Hospitalizations, surgeries, and ER visits in previous 12 months Vitals Screenings to include cognitive, depression, and falls Referrals and appointments  In addition, I have reviewed and discussed with patient  certain preventive protocols, quality metrics, and best practice recommendations. A written personalized care plan for preventive services as well as general preventive health recommendations were provided to patient.     Chriss Driver, LPN   68/34/1961   Nurse Notes: Order placed for mammogram and bone density today. Discussed vaccines, pt declined.

## 2022-08-16 ENCOUNTER — Other Ambulatory Visit: Payer: Self-pay | Admitting: Family Medicine

## 2022-08-16 NOTE — Telephone Encounter (Signed)
TIME FOR PT NEED CPE APPT W/PCP FOR FUTURE REFILLS

## 2022-09-01 ENCOUNTER — Other Ambulatory Visit: Payer: Self-pay | Admitting: Family Medicine

## 2022-09-03 NOTE — Telephone Encounter (Signed)
Requested medication (s) are due for refill today:   Yes  Requested medication (s) are on the active medication list:   Yes  Future visit scheduled:   Yes on 09/30/2022    Last ordered: 01/08/2022 #180, 1 refill  Returned because labs due per protocol.   Provider to review prior to upcoming appt.   Requested Prescriptions  Pending Prescriptions Disp Refills   metFORMIN (GLUCOPHAGE) 1000 MG tablet [Pharmacy Med Name: METFORMIN HYDROCHLORIDE 1000 MG Tablet] 180 tablet 3    Sig: TAKE 1 TABLET TWICE DAILY WITH MEALS     Endocrinology:  Diabetes - Biguanides Failed - 09/01/2022  4:20 AM      Failed - eGFR in normal range and within 360 days    GFR, Est African American  Date Value Ref Range Status  09/25/2020 69 > OR = 60 mL/min/1.79m Final   GFR, Est Non African American  Date Value Ref Range Status  09/25/2020 59 (L) > OR = 60 mL/min/1.752mFinal   eGFR  Date Value Ref Range Status  03/20/2021 67 > OR = 60 mL/min/1.735minal    Comment:    The eGFR is based on the CKD-EPI 2021 equation. To calculate  the new eGFR from a previous Creatinine or Cystatin C result, go to https://www.kidney.org/professionals/ kdoqi/gfr%5Fcalculator          Failed - B12 Level in normal range and within 720 days    No results found for: "VITAMINB12"       Failed - Valid encounter within last 6 months    Recent Outpatient Visits           11 months ago Type 2 diabetes mellitus with complications ([place]CC[place] Bro[place]ard, WarCammie McgeeD   1 year ago Fever, unspecified fever cause   [place]ul[place] BearP   1 year ago Type 2 diabetes mellitus with complications ([place]CC[place] Bro[place]ansoncSusy FrizzleD   1 year ago Type 2 diabetes mellitus with complications ([place]CC[place] BroEarlsborocSusy FrizzleD   2 years ago Type 2 diabetes mellitus with complications (HCCNew[place] BroDothanckard, WarCammie McgeeMD       Future Appointments             In 3 weeks Pickard, WarCammie McgeeD Bro[place]EC            Passed - Cr in normal range and within 360 days    Creat  Date Value Ref Range Status  03/18/2022 1.02 0.50 - 1.05 mg/dL Final         Passed - HBA1C is between 0 and 7.9 and within 180 days    Hgb A1c MFr Bld  Date Value Ref Range Status  03/18/2022 6.3 (H) <5.7 % of total Hgb Final    Comment:    For someone without known diabetes, a hemoglobin  A1c value between 5.7% and 6.4% is consistent with prediabetes and should be confirmed with a  follow-up test. . For someone with known diabetes, a value <7% indicates that their diabetes is well controlled. A1c targets should be individualized based on duration of diabetes, age, comorbid conditions, and other considerations. . This assay result is consistent with an increased risk of diabetes. . Currently, no consensus exists regarding use of hemoglobin A1c for diagnosis of diabetes for children. .Marland Kitchen  Passed - CBC within normal limits and completed in the last 12 months    WBC  Date Value Ref Range Status  03/18/2022 7.8 3.8 - 10.8 Thousand/uL Final   RBC  Date Value Ref Range Status  03/18/2022 4.42 3.80 - 5.10 Million/uL Final   Hemoglobin  Date Value Ref Range Status  03/18/2022 13.9 11.7 - 15.5 g/dL Final   HCT  Date Value Ref Range Status  03/18/2022 40.2 35.0 - 45.0 % Final   MCHC  Date Value Ref Range Status  03/18/2022 34.6 32.0 - 36.0 g/dL Final   [facility]  Date Value Ref Range Status  03/18/2022 31.4 27.0 - 33.0 pg Final   MCV  Date Value Ref Range Status  03/18/2022 91.0 80.0 - 100.0 fL Final   No results found for: "PLTCOUNTKUC", "LABPLAT", "POCPLA" RDW  Date Value Ref Range Status  03/18/2022 13.4 11.0 - 15.0 % Final

## 2022-09-07 ENCOUNTER — Other Ambulatory Visit: Payer: Self-pay | Admitting: Family Medicine

## 2022-09-09 NOTE — Telephone Encounter (Signed)
Requested medication (s) are due for refill today:   Yes  Requested medication (s) are on the active medication list:   Yes  Future visit scheduled:   Yes 09/30/2022 with Dr. Dennard Schaumann   Last ordered: 08/16/2022 #30, 0 refills  Returned for provider review prior to upcoming appt 1/29.   Already given 30 day courtesy refill on 12/15   Requested Prescriptions  Pending Prescriptions Disp Refills   potassium chloride (KLOR-CON M) 10 MEQ tablet [Pharmacy Med Name: POTASSIUM CHLORIDE ER 10 MEQ Tablet Extended Release] 30 tablet 3    Sig: TAKE 1 TABLET EVERY DAY (NEED MD APPOINTMENT FOR REFILLS)     Endocrinology:  Minerals - Potassium Supplementation Passed - 09/07/2022  3:12 AM      Passed - K in normal range and within 360 days    Potassium  Date Value Ref Range Status  03/18/2022 3.9 3.5 - 5.3 mmol/L Final         Passed - Cr in normal range and within 360 days    Creat  Date Value Ref Range Status  03/18/2022 1.02 0.50 - 1.05 mg/dL Final         Passed - Valid encounter within last 12 months    Recent Outpatient Visits           11 months ago Type 2 diabetes mellitus with complications ([place])   [place] Pickard, Cammie Mcgee, MD   1 year ago Fever, unspecified fever cause   [facility] Eulogio Bear, NP   1 year ago Type 2 diabetes mellitus with complications ([place])   [place] Susy Frizzle, MD   1 year ago Type 2 diabetes mellitus with complications ([place])   [place] Susy Frizzle, MD   2 years ago Type 2 diabetes mellitus with complications ([place])   [place] Pickard, Cammie Mcgee, MD       Future Appointments             In 2 weeks Gollan, Kathlene November, MD [place]. [place]   In 3 weeks Pickard, Cammie Mcgee, MD [place]

## 2022-09-10 ENCOUNTER — Other Ambulatory Visit: Payer: Self-pay | Admitting: Family Medicine

## 2022-09-10 DIAGNOSIS — Z1231 Encounter for screening mammogram for malignant neoplasm of breast: Secondary | ICD-10-CM

## 2022-09-16 ENCOUNTER — Other Ambulatory Visit: Payer: Self-pay | Admitting: Family Medicine

## 2022-09-16 NOTE — Telephone Encounter (Signed)
Requested medication (s) are due for refill today: routing for review  Requested medication (s) are on the active medication list: yes  Last refill:  01/08/22  Future visit scheduled: no  Notes to clinic:  Medication not assigned to a protocol, review manually      Requested Prescriptions  Pending Prescriptions Disp Refills   Alcohol Swabs (DROPSAFE ALCOHOL PREP) 70 % PADS [Pharmacy Med Name: DROPSAFE ALCOHOL PREP PADS 70 % Pad] 300 each 3    Sig: USE THREE TIMES DAILY     Off-Protocol Failed - 09/16/2022  3:54 AM      Failed - Medication not assigned to a protocol, review manually.      Passed - Valid encounter within last 12 months    Recent Outpatient Visits           11 months ago Type 2 diabetes mellitus with complications ([place])   [place] Pickard, Cammie Mcgee, MD   1 year ago Fever, unspecified fever cause   [place] Eulogio Bear, NP   1 year ago Type 2 diabetes mellitus with complications ([place])   [place] Susy Frizzle, MD   1 year ago Type 2 diabetes mellitus with complications ([place])   [place] Susy Frizzle, MD   2 years ago Type 2 diabetes mellitus with complications ([place])   [facility] Pickard, Cammie Mcgee, MD       Future Appointments             In 1 week Gollan, Kathlene November, MD [place] [place]   In 2 weeks Pickard, Cammie Mcgee, MD [place]

## 2022-09-23 NOTE — Progress Notes (Deleted)
Cardiology Office Note  Date:  09/23/2022   ID:  Anailah, Mokry [birth year], MRN [id number]  PCP:  Susy Frizzle, MD   No chief complaint on file.   HPI:  Ms. Wortham is a very pleasant 67 year old woman with  diabetes, on insulin.  hemoglobin A1c  5.9  Episodes of sinus tachycardia morbid obesity,  hyperlipidemia,  hypertension,  chronic lower extremity swelling,   dizziness.   Neuropathy in her feet Mom with dementia, lives with her Covid 12/21 Lymphedema, has pumps She presents for routine followup of her hypertension,  leg swelling, sinus tachycardia  Last seen by myself in clinic December 2022   In follow-up today reports feeling relatively well Troubled by low blood pressure but no significant orthostasis symptoms Just feels tired at times at home  Gait instability, uses a cane, no falls  Continues to take metoprolol tartrate 25 twice daily  Lab work reviewed A1C 5.9 Total chol 161 LDL 100  prior carotid ultrasound showing minimal disease.   Not using lymphedema compression pumps Followed by Dr. Lucky Cowboy for lymphedema  EKG personally reviewed by myself on todays visit Shows normal sinus rhythm rate 71 bpm left axis deviation no significant ST-T wave changes  Other past medical hx reviewed  Workup for covid 2021 08/31/20 Pharmacological myocardial perfusion imaging study with no significant  ischemia Normal wall motion, EF estimated at 78% No EKG changes concerning for ischemia at peak stress or in recovery. CT attenuation correction images with mild aortic atherosclerosis, minimal coronary calcification Low risk scan  Echo 08/16/20 1. Left ventricular ejection fraction, by estimation, is 55 to 60%. The  left ventricle has normal function. The left ventricle has no regional  wall motion abnormalities. Left ventricular diastolic parameters are  consistent with Grade II diastolic  dysfunction (pseudonormalization).   2. Right ventricular systolic  function is normal. The right ventricular  size is normal. There is normal pulmonary artery systolic pressure.   3. The pericardial effusion is posterior and lateral to the left  ventricle.   4. The mitral valve is grossly normal. No evidence of mitral valve  regurgitation.   5. The aortic valve was not well visualized. Aortic valve regurgitation  is not visualized.   6. The inferior vena cava is normal in size with greater than 50%  respiratory variability, suggesting right atrial pressure of 3 mmHg.    Holter monitor in the past that showed elevated baseline heart rate with frequent periods of sinus tachycardia. Improvement in dizzy episodes and fluttering with metoprolol.   PMH:   has a past medical history of Arthritis, Bulging disc, Diabetes mellitus, Gastroparesis, History of hepatitis B, Hyperlipidemia, Hypertension, Neuromuscular disorder ([place]), Neuropathy, Nonproliferative diabetic retinopathy ([place]), Periodic heart flutter, Peripheral vascular disease ([place]), Shortness of breath dyspnea, and Sleep apnea.  PSH:    Past Surgical History:  Procedure Laterality Date   BIOPSY  04/28/2018   Procedure: BIOPSY;  Surgeon: Ladene Artist, MD;  Location: [facility];  Service: Endoscopy;;   BIOPSY THYROID     january 2016   ESOPHAGOGASTRODUODENOSCOPY (EGD) WITH PROPOFOL N/A 04/28/2018   Procedure: ESOPHAGOGASTRODUODENOSCOPY (EGD) WITH PROPOFOL;  Surgeon: Ladene Artist, MD;  Location: [facility];  Service: Endoscopy;  Laterality: N/A;   HYSTEROSCOPY WITH D & C N/A 02/15/2015   Procedure: DILATATION AND CURETTAGE /HYSTEROSCOPY;  Surgeon: Linda Hedges, DO;  Location: [facility];  Service: Gynecology;  Laterality: N/A;   TONSILLECTOMY      Outpatient Encounter Medications as of 09/24/2022  Medication Sig Note   Accu-Chek Softclix Lancets lancets TEST BLOOD SUGAR THREE TIMES DAILY    Alcohol Swabs (DROPSAFE ALCOHOL PREP) 70 % PADS USE THREE TIMES DAILY    aspirin EC 81 MG tablet Take 81  mg by mouth daily.    Blood Glucose Calibration (TRUE METRIX LEVEL 2) Normal SOLN Use as directed to monitor FSBS 3x daily. Dx: E11.65    Blood Glucose Monitoring Suppl (TRUE METRIX AIR GLUCOSE METER) DEVI 1 each by Does not apply route daily. Use as directed to monitor FSBS 3x daily. Dx: E11.65    FARXIGA 10 MG TABS tablet TAKE 1 TABLET EVERY DAY BEFORE BREAKFAST    furosemide (LASIX) 20 MG tablet TAKE 1 TABLET EVERY DAY    gabapentin (NEURONTIN) 300 MG capsule TAKE 1 CAPSULE THREE TIMES DAILY    glucose blood (ACCU-CHEK GUIDE) test strip TEST BLOOD SUGAR THREE TIMES DAILY.    lisinopril (ZESTRIL) 5 MG tablet TAKE 1 TABLET (5 MG TOTAL) BY MOUTH DAILY. 03/25/2022: Pt states she has been off x 1 week.   metFORMIN (GLUCOPHAGE) 1000 MG tablet TAKE 1 TABLET TWICE DAILY WITH MEALS    metoprolol tartrate (LOPRESSOR) 50 MG tablet Take 0.5 tablets (25 mg total) by mouth 2 (two) times daily.    Multiple Vitamin (MULTIVITAMIN) tablet Take 1 tablet by mouth daily.    ondansetron (ZOFRAN) 4 MG tablet TAKE 1 TABLET (4 MG TOTAL) BY MOUTH EVERY 8 (EIGHT) HOURS AS NEEDED FOR NAUSEA OR VOMITING.    pantoprazole (PROTONIX) 40 MG tablet TAKE 1 TABLET (40 MG TOTAL) BY MOUTH 2 (TWO) TIMES DAILY BEFORE  MEALS    potassium chloride (KLOR-CON M) 10 MEQ tablet TAKE 1 TABLET EVERY DAY (NEED MD APPOINTMENT FOR REFILLS)    pravastatin (PRAVACHOL) 40 MG tablet Take 1 tablet (40 mg total) by mouth daily.    predniSONE (DELTASONE) 20 MG tablet 3 tabs poqday 1-2, 2 tabs poqday 3-4, 1 tab poqday 5-6    traMADol (ULTRAM) 50 MG tablet TAKE 1 TABLET BY MOUTH EVERY 6 HOURS AS NEEDED FOR  CHRONIC  BACK  PAIN    TRULICITY 1.5 0000000 SOPN INJECT 1.5MG (1 PEN) SUBCUTANEOUSLY EVERY WEEK AS DIRECTED    No facility-administered encounter medications on file as of 09/24/2022.   Allergies:   Codeine, Penicillins, Ciprofloxacin, Food, Januvia [sitagliptin], and Latex   Social History:  The patient  reports that she has never smoked. She  has never used smokeless tobacco. She reports that she does not drink alcohol and does not use drugs.   Family History:   family history includes Heart disease in her brother, brother, and father; Hyperlipidemia in her father and mother; Hypertension in her father; Kidney cancer in her sister; Kidney disease in her maternal aunt, maternal uncle, and sister; Ovarian cancer in her maternal aunt; Stroke in her brother.    Review of Systems: Review of Systems  Constitutional: Negative.   HENT: Negative.    Respiratory: Negative.    Cardiovascular:  Positive for leg swelling.  Gastrointestinal: Negative.   Musculoskeletal: Negative.        Leg weakness, balance instability  Neurological: Negative.   Psychiatric/Behavioral: Negative.    All other systems reviewed and are negative.   PHYSICAL EXAM: VS:  There were no vitals taken for this visit. , BMI There is no height or weight on file to calculate BMI. Constitutional:  oriented to person, place, and time. No distress.  Obese HENT:  Head: Grossly normal Eyes:  no  discharge. No scleral icterus.  Neck: No JVD, no carotid bruits  Cardiovascular: Regular rate and rhythm, no murmurs appreciated Pulmonary/Chest: Clear to auscultation bilaterally, no wheezes or rails Abdominal: Soft.  no distension.  no tenderness.  Musculoskeletal: Normal range of motion Neurological:  normal muscle tone. Coordination normal. No atrophy Skin: Skin warm and dry Psychiatric: normal affect, pleasant   Recent Labs: 03/18/2022: ALT 14; BUN 14; Creat 1.02; Hemoglobin 13.9; Platelets 90; Potassium 3.9; Sodium 142; TSH 2.65    Lipid Panel Lab Results  Component Value Date   CHOL 206 (H) 03/18/2022   HDL 30 (L) 03/18/2022   LDLCALC 141 (H) 03/18/2022   TRIG 210 (H) 03/18/2022      Wt Readings from Last 3 Encounters:  08/14/22 277 lb (125.6 kg)  03/25/22 277 lb 12.8 oz (126 kg)  03/19/22 280 lb (127 kg)      ASSESSMENT AND PLAN:  Shortness of  breath - Plan: EKG 12-Lead Obesity, deconditioning Weight trending down  Type 2 diabetes mellitus with complication, with long-term current use of insulin ([place]) - Plan: EKG 12-Lead We have encouraged continued exercise, careful diet management in an effort to lose weight. Recommended avoiding sweet tea, sodas Recommend she move the lisinopril 5 mg to the evening given fatigue in the day  Paroxysmal tachycardia Controlled on metoprolol tartrate 25 twice daily She is concerned about low blood pressure causing fatigue Recommend she could try metoprolol 12.5 mg twice daily with extra metoprolol as needed  Essential hypertension - Plan: EKG 12-Lead Stay on metoprolol, rate and rhythm well controlled Blood pressure numbers well controlled  Pure hypercholesterolemia - Plan: EKG 12-Lead Stay on pravastain  Localized edema - Plan: EKG 12-Lead Chronic venous stasis/lymphedema Needs ace wraps, compression pumps Followed by Dr. Lucky Cowboy  Morbid obesity [facility]) Unable to exercise Stressed importance of low carbohydrates   Total encounter time more than 25 minutes  Greater than 50% was spent in counseling and coordination of care with the patient    No orders of the defined types were placed in this encounter.    Signed, Esmond Plants, M.D., Ph.D. 09/23/2022  [place], [place]

## 2022-09-24 ENCOUNTER — Inpatient Hospital Stay: Admission: RE | Admit: 2022-09-24 | Payer: Medicare HMO | Source: Ambulatory Visit

## 2022-09-24 ENCOUNTER — Ambulatory Visit: Payer: Medicare HMO | Admitting: Cardiovascular Disease

## 2022-09-24 ENCOUNTER — Other Ambulatory Visit: Payer: Medicare HMO

## 2022-09-26 ENCOUNTER — Other Ambulatory Visit: Payer: Medicare HMO

## 2022-09-27 ENCOUNTER — Other Ambulatory Visit: Payer: Medicare HMO

## 2022-09-27 DIAGNOSIS — R5383 Other fatigue: Secondary | ICD-10-CM

## 2022-09-27 DIAGNOSIS — E118 Type 2 diabetes mellitus with unspecified complications: Secondary | ICD-10-CM | POA: Diagnosis not present

## 2022-09-27 DIAGNOSIS — I10 Essential (primary) hypertension: Secondary | ICD-10-CM

## 2022-09-27 DIAGNOSIS — R5381 Other malaise: Secondary | ICD-10-CM

## 2022-09-27 DIAGNOSIS — E7800 Pure hypercholesterolemia, unspecified: Secondary | ICD-10-CM | POA: Diagnosis not present

## 2022-09-28 LAB — CBC WITH DIFFERENTIAL/PLATELET
Absolute Monocytes: 338 cells/uL (ref 200–950)
Basophils Absolute: 60 cells/uL (ref 0–200)
Basophils Relative: 0.8 %
Eosinophils Absolute: 83 cells/uL (ref 15–500)
Eosinophils Relative: 1.1 %
HCT: 38.7 % (ref 35.0–45.0)
Hemoglobin: 13.5 g/dL (ref 11.7–15.5)
Lymphs Abs: 2078 cells/uL (ref 850–3900)
MCH: 30.7 pg (ref 27.0–33.0)
MCHC: 34.9 g/dL (ref 32.0–36.0)
MCV: 88 fL (ref 80.0–100.0)
MPV: 9.5 fL (ref 7.5–12.5)
Monocytes Relative: 4.5 %
Neutro Abs: 4943 cells/uL (ref 1500–7800)
Neutrophils Relative %: 65.9 %
Platelets: 104 10*3/uL — ABNORMAL LOW (ref 140–400)
RBC: 4.4 10*6/uL (ref 3.80–5.10)
RDW: 13.8 % (ref 11.0–15.0)
Total Lymphocyte: 27.7 %
WBC: 7.5 10*3/uL (ref 3.8–10.8)

## 2022-09-28 LAB — COMPLETE METABOLIC PANEL WITH GFR
AG Ratio: 1.7 (calc) (ref 1.0–2.5)
ALT: 12 U/L (ref 6–29)
AST: 13 U/L (ref 10–35)
Albumin: 4 g/dL (ref 3.6–5.1)
Alkaline phosphatase (APISO): 62 U/L (ref 37–153)
BUN: 17 mg/dL (ref 7–25)
CO2: 25 mmol/L (ref 20–32)
Calcium: 9.1 mg/dL (ref 8.6–10.4)
Chloride: 105 mmol/L (ref 98–110)
Creat: 0.89 mg/dL (ref 0.50–1.05)
Globulin: 2.3 g/dL (calc) (ref 1.9–3.7)
Glucose, Bld: 113 mg/dL — ABNORMAL HIGH (ref 65–99)
Potassium: 3.9 mmol/L (ref 3.5–5.3)
Sodium: 141 mmol/L (ref 135–146)
Total Bilirubin: 0.5 mg/dL (ref 0.2–1.2)
Total Protein: 6.3 g/dL (ref 6.1–8.1)
eGFR: 72 mL/min/{1.73_m2} (ref >=60)

## 2022-09-28 LAB — HEMOGLOBIN A1C
Hgb A1c MFr Bld: 6.6 % of total Hgb — ABNORMAL HIGH (ref <5.7)
Mean Plasma Glucose: 143 mg/dL
eAG (mmol/L): 7.9 mmol/L

## 2022-09-28 LAB — LIPID PANEL
Cholesterol: 151 mg/dL (ref <200)
HDL: 29 mg/dL — ABNORMAL LOW (ref >=50)
LDL Cholesterol (Calc): 97 mg/dL (calc)
Non-HDL Cholesterol (Calc): 122 mg/dL (calc) (ref <130)
Total CHOL/HDL Ratio: 5.2 (calc) — ABNORMAL HIGH (ref <5.0)
Triglycerides: 153 mg/dL — ABNORMAL HIGH (ref <150)

## 2022-09-28 LAB — VITAMIN B12: Vitamin B-12: 247 pg/mL (ref 200–1100)

## 2022-09-28 LAB — TSH: TSH: 1.93 mIU/L (ref 0.40–4.50)

## 2022-09-30 ENCOUNTER — Encounter: Payer: Medicare HMO | Admitting: Family Medicine

## 2022-09-30 ENCOUNTER — Ambulatory Visit: Payer: Medicare HMO | Admitting: Family Medicine

## 2022-10-02 ENCOUNTER — Other Ambulatory Visit: Payer: Self-pay | Admitting: Family Medicine

## 2022-10-23 ENCOUNTER — Telehealth: Payer: Self-pay | Admitting: Pharmacist

## 2022-10-23 NOTE — Progress Notes (Signed)
Care Management & Coordination Services Pharmacy Team   Reason for Encounter: Hypertension   Contacted patient to discuss hypertension disease state. Unsuccessful outreach. Left voicemail for patient to return call.    Current antihypertensive regimen:  furosemide (LASIX) 20 MG tablet  metoprolol tartrate (LOPRESSOR) 50 MG tablet  lisinopril (ZESTRIL) 5 MG tablet   Patient verbally confirms she is taking the above medications as directed. Yes  How often are you checking your Blood Pressure?   she checks her blood pressure taking her medication.  Current home BP readings:   DATE:             BP               PULSE   Wrist or arm cuff: Caffeine intake: Salt intake: OTC medications including pseudoephedrine or NSAIDs?  Any readings above 180/100? If yes any symptoms of hypertensive emergency?   What recent interventions/DTPs have been made by any provider to improve Blood Pressure control since last CPP Visit:  Patient denied any recent medications changes since last visit with CPP.   Any recent hospitalizations or ED visits since last visit with CPP? No  What diet changes have been made to improve Blood Pressure Control?  Patient reported being mindful of her salt intake in her diet.   What exercise is being done to improve your Blood Pressure Control?  Patient reported remaining as active as possible.   Adherence Review: Is the patient currently on ACE/ARB medication? Yes Does the patient have >5 day gap between last estimated fill dates? No  Star Rating Drugs:  Medication:    Last Fill: Day Supply  lisinopril 5 MG tablet   04/08/22  90  metFORMIN 1000 MG tablet   09/04/22  90   pravastatin 40 MG tablet   08/16/22 90  Chart Updates: Recent office visits:  08/14/22 Annual Medicare Wellness Completed  03/25/22 Jenna Luo, MD - Family Medicine - Left sided sciatica - XR of Lumbar spine ordered. Restart Pravastatin '40mg'$  and follow up in 6 months for labs.    Recent consult visits:  07/02/22 Bernarda Caffey - Ophthalmology - Eye exam completed. Follow up 6-9 months.  04/01/22 07/02/22 Bernarda Caffey - Ophthalmology - Early Stage Macular Degeneration - Exam completed. Follow up in 3-4 months.    Hospital visits:  None in previous 6 months  Medications: Outpatient Encounter Medications as of 10/23/2022  Medication Sig Note   Accu-Chek Softclix Lancets lancets TEST BLOOD SUGAR THREE TIMES DAILY    Alcohol Swabs (DROPSAFE ALCOHOL PREP) 70 % PADS USE THREE TIMES DAILY    aspirin EC 81 MG tablet Take 81 mg by mouth daily.    Blood Glucose Calibration (TRUE METRIX LEVEL 2) Normal SOLN Use as directed to monitor FSBS 3x daily. Dx: E11.65    Blood Glucose Monitoring Suppl (TRUE METRIX AIR GLUCOSE METER) DEVI 1 each by Does not apply route daily. Use as directed to monitor FSBS 3x daily. Dx: E11.65    FARXIGA 10 MG TABS tablet TAKE 1 TABLET EVERY DAY BEFORE BREAKFAST    furosemide (LASIX) 20 MG tablet TAKE 1 TABLET EVERY DAY    gabapentin (NEURONTIN) 300 MG capsule TAKE 1 CAPSULE THREE TIMES DAILY    glucose blood (ACCU-CHEK GUIDE) test strip TEST BLOOD SUGAR THREE TIMES DAILY.    lisinopril (ZESTRIL) 5 MG tablet TAKE 1 TABLET (5 MG TOTAL) BY MOUTH DAILY. 03/25/2022: Pt states she has been off x 1 week.   metFORMIN (GLUCOPHAGE) 1000 MG  tablet TAKE 1 TABLET TWICE DAILY WITH MEALS    metoprolol tartrate (LOPRESSOR) 50 MG tablet Take 0.5 tablets (25 mg total) by mouth 2 (two) times daily.    Multiple Vitamin (MULTIVITAMIN) tablet Take 1 tablet by mouth daily.    ondansetron (ZOFRAN) 4 MG tablet TAKE 1 TABLET (4 MG TOTAL) BY MOUTH EVERY 8 (EIGHT) HOURS AS NEEDED FOR NAUSEA OR VOMITING.    pantoprazole (PROTONIX) 40 MG tablet TAKE 1 TABLET (40 MG TOTAL) BY MOUTH 2 (TWO) TIMES DAILY BEFORE  MEALS    potassium chloride (KLOR-CON M) 10 MEQ tablet TAKE 1 TABLET EVERY DAY. (NEED MD APPOINTMENT)    pravastatin (PRAVACHOL) 40 MG tablet Take 1 tablet (40 mg total) by  mouth daily.    predniSONE (DELTASONE) 20 MG tablet 3 tabs poqday 1-2, 2 tabs poqday 3-4, 1 tab poqday 5-6    traMADol (ULTRAM) 50 MG tablet TAKE 1 TABLET BY MOUTH EVERY 6 HOURS AS NEEDED FOR  CHRONIC  BACK  PAIN    TRULICITY 1.5 0000000 SOPN INJECT 1.'5MG'$  (1 PEN) SUBCUTANEOUSLY EVERY WEEK AS DIRECTED    No facility-administered encounter medications on file as of 10/23/2022.    Recent Office Vitals: BP Readings from Last 3 Encounters:  03/25/22 118/62  03/19/22 135/81  09/24/21 (!) 108/58   Pulse Readings from Last 3 Encounters:  03/25/22 75  03/19/22 71  09/24/21 68    Wt Readings from Last 3 Encounters:  08/14/22 277 lb (125.6 kg)  03/25/22 277 lb 12.8 oz (126 kg)  03/19/22 280 lb (127 kg)     Kidney Function Lab Results  Component Value Date/Time   CREATININE 0.89 09/27/2022 02:00 PM   CREATININE 1.02 03/18/2022 10:28 AM   GFRNONAA 59 (L) 09/25/2020 11:38 AM   GFRAA 69 09/25/2020 11:38 AM       Latest Ref Rng & Units 09/27/2022    2:00 PM 03/18/2022   10:28 AM 09/19/2021   11:34 AM  BMP  Glucose 65 - 99 mg/dL 113  154  155   BUN 7 - 25 mg/dL '17  14  16   '$ Creatinine 0.50 - 1.05 mg/dL 0.89  1.02  0.99   BUN/Creat Ratio 6 - 22 (calc) SEE NOTE:  NOT APPLICABLE  NOT APPLICABLE   Sodium A999333 - 146 mmol/L 141  142  141   Potassium 3.5 - 5.3 mmol/L 3.9  3.9  4.3   Chloride 98 - 110 mmol/L 105  102  102   CO2 20 - 32 mmol/L 25  26  32   Calcium 8.6 - 10.4 mg/dL 9.1  8.9  9.5      Future Appointments  Date Time Provider [place]  10/24/2022  3:00 PM Susy Frizzle, MD [id number]  12/10/2022  3:00 PM Minna Merritts, MD [facility] None  01/28/2023  2:00 PM Bernarda Caffey, MD [facility] None  03/18/2023  1:00 PM Dew, Erskine Squibb, MD [facility] None    [place], [place]

## 2022-10-24 ENCOUNTER — Ambulatory Visit (INDEPENDENT_AMBULATORY_CARE_PROVIDER_SITE_OTHER): Payer: Medicare HMO | Admitting: Family Medicine

## 2022-10-24 ENCOUNTER — Encounter: Payer: Self-pay | Admitting: Family Medicine

## 2022-10-24 VITALS — BP 124/82 | HR 74 | Temp 98.3°F | Ht 66.0 in | Wt 287.0 lb

## 2022-10-24 DIAGNOSIS — I10 Essential (primary) hypertension: Secondary | ICD-10-CM | POA: Diagnosis not present

## 2022-10-24 DIAGNOSIS — Z0001 Encounter for general adult medical examination with abnormal findings: Secondary | ICD-10-CM | POA: Diagnosis not present

## 2022-10-24 DIAGNOSIS — L97511 Non-pressure chronic ulcer of other part of right foot limited to breakdown of skin: Secondary | ICD-10-CM | POA: Diagnosis not present

## 2022-10-24 DIAGNOSIS — I890 Lymphedema, not elsewhere classified: Secondary | ICD-10-CM | POA: Diagnosis not present

## 2022-10-24 DIAGNOSIS — K7581 Nonalcoholic steatohepatitis (NASH): Secondary | ICD-10-CM

## 2022-10-24 DIAGNOSIS — E118 Type 2 diabetes mellitus with unspecified complications: Secondary | ICD-10-CM | POA: Diagnosis not present

## 2022-10-24 DIAGNOSIS — E08621 Diabetes mellitus due to underlying condition with foot ulcer: Secondary | ICD-10-CM

## 2022-10-24 DIAGNOSIS — E11621 Type 2 diabetes mellitus with foot ulcer: Secondary | ICD-10-CM | POA: Diagnosis not present

## 2022-10-24 DIAGNOSIS — Z0000 Encounter for general adult medical examination without abnormal findings: Secondary | ICD-10-CM

## 2022-10-24 DIAGNOSIS — Z1231 Encounter for screening mammogram for malignant neoplasm of breast: Secondary | ICD-10-CM | POA: Diagnosis not present

## 2022-10-24 MED ORDER — TRULICITY 3 MG/0.5ML ~~LOC~~ SOAJ: 3.0000 mg | SUBCUTANEOUS | 3 refills | Status: DC

## 2022-10-24 NOTE — Progress Notes (Signed)
Subjective:    Patient ID: Linda Briggs, female    DOB: [birth year], 67 y.o.   MRN: [id number]  Patient is here today for complete physical exam.  She is overdue for mammogram.  She is also due for colonoscopy.  She refuses a colonoscopy and she also refuses Cologuard.  Patient history of liver disease.  She is due to repeat an ultrasound to monitor for the development of cirrhosis.  She also has a reported history of hepatitis B.  She is overdue for hepatitis C screening.  She has been taking lisinopril 5 mg daily due to elevated blood pressures and renal protection.  She is also started back on glipizide due to elevated sugars.  I advised the patient I want her to stop the glipizide but I would much rather her increase Trulicity to try to help achieve weight loss.  I performed a diabetic foot exam today and the tip of her right great toe has a black necrotic eschar just below the toenail.  The area of necrosis is roughly 2 cm in diameter.  There is a blistering of the medial left first MTP joint.  She has no sensation to 10 g monofilament with palpable dorsalis pedis and posterior tibialis pulses Lab on 09/27/2022  Component Date Value Ref Range Status   WBC 09/27/2022 7.5  3.8 - 10.8 Thousand/uL Final   RBC 09/27/2022 4.40  3.80 - 5.10 Million/uL Final   Hemoglobin 09/27/2022 13.5  11.7 - 15.5 g/dL Final   HCT 09/27/2022 38.7  35.0 - 45.0 % Final   MCV 09/27/2022 88.0  80.0 - 100.0 fL Final   MCH 09/27/2022 30.7  27.0 - 33.0 pg Final   MCHC 09/27/2022 34.9  32.0 - 36.0 g/dL Final   RDW 09/27/2022 13.8  11.0 - 15.0 % Final   Platelets 09/27/2022 104 (L)  140 - 400 Thousand/uL Final   MPV 09/27/2022 9.5  7.5 - 12.5 fL Final   Neutro Abs 09/27/2022 4,943  1,500 - 7,800 cells/uL Final   Lymphs Abs 09/27/2022 2,078  850 - 3,900 cells/uL Final   Absolute Monocytes 09/27/2022 338  200 - 950 cells/uL Final   Eosinophils Absolute 09/27/2022 83  15 - 500 cells/uL Final   Basophils Absolute  09/27/2022 60  0 - 200 cells/uL Final   Neutrophils Relative % 09/27/2022 65.9  % Final   Total Lymphocyte 09/27/2022 27.7  % Final   Monocytes Relative 09/27/2022 4.5  % Final   Eosinophils Relative 09/27/2022 1.1  % Final   Basophils Relative 09/27/2022 0.8  % Final   Glucose, Bld 09/27/2022 113 (H)  65 - 99 mg/dL Final   Comment: .            Fasting reference interval . For someone without known diabetes, a glucose value between 100 and 125 mg/dL is consistent with prediabetes and should be confirmed with a follow-up test. .    BUN 09/27/2022 17  7 - 25 mg/dL Final   Creat 09/27/2022 0.89  0.50 - 1.05 mg/dL Final   eGFR 09/27/2022 72  > OR = 60 mL/min/1.45m Final   BUN/Creatinine Ratio 09/27/2022 SEE NOTE:  6 - 22 (calc) Final   Comment:    Not Reported: BUN and Creatinine are within    reference range. .    Sodium 09/27/2022 141  135 - 146 mmol/L Final   Potassium 09/27/2022 3.9  3.5 - 5.3 mmol/L Final   Chloride 09/27/2022 105  98 - 110 mmol/L  Final   CO2 09/27/2022 25  20 - 32 mmol/L Final   Calcium 09/27/2022 9.1  8.6 - 10.4 mg/dL Final   Total Protein 09/27/2022 6.3  6.1 - 8.1 g/dL Final   Albumin 09/27/2022 4.0  3.6 - 5.1 g/dL Final   Globulin 09/27/2022 2.3  1.9 - 3.7 g/dL (calc) Final   AG Ratio 09/27/2022 1.7  1.0 - 2.5 (calc) Final   Total Bilirubin 09/27/2022 0.5  0.2 - 1.2 mg/dL Final   Alkaline phosphatase (APISO) 09/27/2022 62  37 - 153 U/L Final   AST 09/27/2022 13  10 - 35 U/L Final   ALT 09/27/2022 12  6 - 29 U/L Final   Cholesterol 09/27/2022 151  <200 mg/dL Final   HDL 09/27/2022 29 (L)  > OR = 50 mg/dL Final   Triglycerides 09/27/2022 153 (H)  <150 mg/dL Final   LDL Cholesterol (Calc) 09/27/2022 97  mg/dL (calc) Final   Comment: Reference range: <100 . Desirable range <100 mg/dL for primary prevention;   <70 mg/dL for patients with CHD or diabetic patients  with > or = 2 CHD risk factors. Marland Kitchen LDL-C is now calculated using the Martin-Hopkins   calculation, which is a validated novel method providing  better accuracy than the Friedewald equation in the  estimation of LDL-C.  Cresenciano Genre et al. Annamaria Helling. [id number]): 2061-2068  (http://education.[employer].com/faq/FAQ164)    Total CHOL/HDL Ratio 09/27/2022 5.2 (H)  <5.0 (calc) Final   Non-HDL Cholesterol (Calc) 09/27/2022 122  <130 mg/dL (calc) Final   Comment: For patients with diabetes plus 1 major ASCVD risk  factor, treating to a non-HDL-C goal of <100 mg/dL  (LDL-C of <70 mg/dL) is considered a therapeutic  option.    Hgb A1c MFr Bld 09/27/2022 6.6 (H)  <5.7 % of total Hgb Final   Comment: For someone without known diabetes, a hemoglobin A1c value of 6.5% or greater indicates that they may have  diabetes and this should be confirmed with a follow-up  test. . For someone with known diabetes, a value <7% indicates  that their diabetes is well controlled and a value  greater than or equal to 7% indicates suboptimal  control. A1c targets should be individualized based on  duration of diabetes, age, comorbid conditions, and  other considerations. . Currently, no consensus exists regarding use of hemoglobin A1c for diagnosis of diabetes for children. .    Mean Plasma Glucose 09/27/2022 143  mg/dL Final   eAG (mmol/L) 09/27/2022 7.9  mmol/L Final   Comment: . HbA1c performed on Roche platform. Effective 06/10/22 a change in test platforms may have  shifted HbA1c results compared to historical results.    Vitamin B-12 09/27/2022 247  200 - 1,100 pg/mL Final   Comment: . Please Note: Although the reference range for vitamin B12 is [id number] pg/mL, it has been reported that between 5 and 10% of patients with values between 200 and 400 pg/mL may experience neuropsychiatric and hematologic abnormalities due to occult B12 deficiency; less than 1% of patients with values above 400 pg/mL will have symptoms. .    TSH 09/27/2022 1.93  0.40 - 4.50 mIU/L Final    Past  Medical History:  Diagnosis Date   Arthritis    feet & toes   Bulging disc    Diabetes mellitus    Gastroparesis    History of hepatitis B    Hyperlipidemia    Hypertension    Neuromuscular disorder (HCC)    in feet and fingers  Neuropathy    feet   Nonproliferative diabetic retinopathy (HCC)    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    Peripheral vascular disease (HCC)    Shortness of breath dyspnea    on lopressor    Sleep apnea    pt had study done but it was incomplete due to blood sugar issues    Past Surgical History:  Procedure Laterality Date   BIOPSY  04/28/2018   Procedure: BIOPSY;  Surgeon: Ladene Artist, MD;  Location: [facility];  Service: Endoscopy;;   BIOPSY THYROID     january 2016   ESOPHAGOGASTRODUODENOSCOPY (EGD) WITH PROPOFOL N/A 04/28/2018   Procedure: ESOPHAGOGASTRODUODENOSCOPY (EGD) WITH PROPOFOL;  Surgeon: Ladene Artist, MD;  Location: [facility];  Service: Endoscopy;  Laterality: N/A;   HYSTEROSCOPY WITH D & C N/A 02/15/2015   Procedure: DILATATION AND CURETTAGE /HYSTEROSCOPY;  Surgeon: Linda Hedges, DO;  Location: [facility];  Service: Gynecology;  Laterality: N/A;   TONSILLECTOMY     Current Outpatient Medications on File Prior to Visit  Medication Sig Dispense Refill   Accu-Chek Softclix Lancets lancets TEST BLOOD SUGAR THREE TIMES DAILY 300 each 1   Alcohol Swabs (DROPSAFE ALCOHOL PREP) 70 % PADS USE THREE TIMES DAILY 300 each 3   aspirin EC 81 MG tablet Take 81 mg by mouth daily.     Blood Glucose Calibration (TRUE METRIX LEVEL 2) Normal SOLN Use as directed to monitor FSBS 3x daily. Dx: E11.65 1 each 1   Blood Glucose Monitoring Suppl (TRUE METRIX AIR GLUCOSE METER) DEVI 1 each by Does not apply route daily. Use as directed to monitor FSBS 3x daily. Dx: E11.65 1 each 5   FARXIGA 10 MG TABS tablet TAKE 1 TABLET EVERY DAY BEFORE BREAKFAST 90 tablet 3   furosemide (LASIX) 20 MG tablet TAKE 1 TABLET EVERY DAY 90 tablet 3   gabapentin  (NEURONTIN) 300 MG capsule TAKE 1 CAPSULE THREE TIMES DAILY 270 capsule 1   glucose blood (ACCU-CHEK GUIDE) test strip TEST BLOOD SUGAR THREE TIMES DAILY. 300 strip 11   lisinopril (ZESTRIL) 5 MG tablet TAKE 1 TABLET (5 MG TOTAL) BY MOUTH DAILY. 90 tablet 3   metFORMIN (GLUCOPHAGE) 1000 MG tablet TAKE 1 TABLET TWICE DAILY WITH MEALS 180 tablet 3   metoprolol tartrate (LOPRESSOR) 50 MG tablet Take 0.5 tablets (25 mg total) by mouth 2 (two) times daily. 90 tablet 0   Multiple Vitamin (MULTIVITAMIN) tablet Take 1 tablet by mouth daily.     ondansetron (ZOFRAN) 4 MG tablet TAKE 1 TABLET (4 MG TOTAL) BY MOUTH EVERY 8 (EIGHT) HOURS AS NEEDED FOR NAUSEA OR VOMITING. 60 tablet 3   pantoprazole (PROTONIX) 40 MG tablet TAKE 1 TABLET (40 MG TOTAL) BY MOUTH 2 (TWO) TIMES DAILY BEFORE  MEALS 180 tablet 1   potassium chloride (KLOR-CON M) 10 MEQ tablet TAKE 1 TABLET EVERY DAY. (NEED MD APPOINTMENT) 30 tablet 3   pravastatin (PRAVACHOL) 40 MG tablet Take 1 tablet (40 mg total) by mouth daily. 90 tablet 3   predniSONE (DELTASONE) 20 MG tablet 3 tabs poqday 1-2, 2 tabs poqday 3-4, 1 tab poqday 5-6 12 tablet 0   traMADol (ULTRAM) 50 MG tablet TAKE 1 TABLET BY MOUTH EVERY 6 HOURS AS NEEDED FOR  CHRONIC  BACK  PAIN 60 tablet 0   TRULICITY 1.5 0000000 SOPN INJECT 1.5MG (1 PEN) SUBCUTANEOUSLY EVERY WEEK AS DIRECTED 12 mL 1   No current facility-administered medications on file prior to visit.  Allergies  Allergen Reactions   Codeine Anaphylaxis and Hives   Penicillins Anaphylaxis, Hives and Other (See Comments)    Has patient had a PCN reaction causing immediate rash, facial/tongue/throat swelling, SOB or lightheadedness with hypotension: No Has patient had a PCN reaction causing severe rash involving mucus membranes or skin necrosis: No Has patient had a PCN reaction that required hospitalization: No Has patient had a PCN reaction occurring within the last 10 years: No If all of the above answers are "NO", then  may proceed with Cephalosporin use.    Ciprofloxacin Other (See Comments)    Tongue Swelling   Food Nausea And Vomiting and Other (See Comments)    Quentin Cornwall    Januvia [Sitagliptin] Other (See Comments)    Stomach cramps   Latex Hives   Social History   Socioeconomic History   Marital status: Single    Spouse name: Not on file   Number of children: 0   Years of education: Not on file   Highest education level: Not on file  Occupational History   Occupation: retired  Tobacco Use   Smoking status: Never   Smokeless tobacco: Never  Vaping Use   Vaping Use: Never used  Substance and Sexual Activity   Alcohol use: No    Comment: social drinker.   Drug use: No   Sexual activity: Not Currently  Other Topics Concern   Not on file  Social History Narrative   Lives alone. 1 brother living.    Social Determinants of Health   Financial Resource Strain: Low Risk  (08/14/2022)   Overall Financial Resource Strain (CARDIA)    Difficulty of Paying Living Expenses: Not hard at all  Food Insecurity: No Food Insecurity (08/14/2022)   Hunger Vital Sign    Worried About Running Out of Food in the Last Year: Never true    Ran Out of Food in the Last Year: Never true  Transportation Needs: No Transportation Needs (08/14/2022)   PRAPARE - Hydrologist (Medical): No    Lack of Transportation (Non-Medical): No  Physical Activity: Insufficiently Active (08/14/2022)   Exercise Vital Sign    Days of Exercise per Week: 3 days    Minutes of Exercise per Session: 30 min  Stress: No Stress Concern Present (08/14/2022)   [place]    Feeling of Stress : Not at all  Social Connections: Moderately Integrated (08/14/2022)   Social Connection and Isolation Panel [NHANES]    Frequency of Communication with Friends and Family: More than three times a week    Frequency of Social Gatherings with Friends and  Family: Three times a week    Attends Religious Services: 1 to 4 times per year    Active Member of Clubs or Organizations: Yes    Attends Archivist Meetings: More than 4 times per year    Marital Status: Never married  Intimate Partner Violence: Not At Risk (08/14/2022)   Humiliation, Afraid, Rape, and Kick questionnaire    Fear of Current or Ex-Partner: No    Emotionally Abused: No    Physically Abused: No    Sexually Abused: No   Family History  Problem Relation Age of Onset   Hyperlipidemia Mother    Heart disease Father    Hyperlipidemia Father    Hypertension Father    Heart disease Brother    Kidney disease Sister    Kidney cancer Sister  Ovarian cancer Maternal Aunt    Kidney disease Maternal Aunt        x 2   Kidney disease Maternal Uncle        x 2   Stroke Brother    Heart disease Brother    Colon cancer Neg Hx    Stomach cancer Neg Hx       Review of Systems  All other systems reviewed and are negative.      Objective:   Physical Exam Vitals reviewed.  Constitutional:      General: She is not in acute distress.    Appearance: She is well-developed. She is not diaphoretic.  HENT:     Head: Normocephalic and atraumatic.     Right Ear: External ear normal.     Left Ear: External ear normal.     Nose: Nose normal.     Mouth/Throat:     Pharynx: No oropharyngeal exudate.  Eyes:     General: No scleral icterus.       Right eye: No discharge.        Left eye: No discharge.     Conjunctiva/sclera: Conjunctivae normal.     Pupils: Pupils are equal, round, and reactive to light.  Neck:     Thyroid: No thyromegaly.     Vascular: No JVD.     Trachea: No tracheal deviation.  Cardiovascular:     Rate and Rhythm: Normal rate and regular rhythm.     Heart sounds: Normal heart sounds. No murmur heard.    No friction rub. No gallop.  Pulmonary:     Effort: Pulmonary effort is normal. No respiratory distress.     Breath sounds: Normal breath  sounds. No stridor. No wheezing or rales.  Chest:     Chest wall: No tenderness.  Abdominal:     General: Bowel sounds are normal. There is no distension.     Palpations: Abdomen is soft. There is no mass.     Tenderness: There is no abdominal tenderness. There is no guarding or rebound.  Musculoskeletal:        General: No tenderness. Normal range of motion.     Cervical back: Normal range of motion and neck supple.       Feet:  Lymphadenopathy:     Cervical: No cervical adenopathy.  Skin:    General: Skin is warm.     Coloration: Skin is not pale.     Findings: No erythema or rash.  Neurological:     Mental Status: She is alert and oriented to person, place, and time.     Cranial Nerves: No cranial nerve deficit.     Motor: No abnormal muscle tone.     Coordination: Coordination normal.     Deep Tendon Reflexes: Reflexes are normal and symmetric.  Psychiatric:        Behavior: Behavior normal.        Thought Content: Thought content normal.        Judgment: Judgment normal.         Assessment & Plan:   NASH (nonalcoholic steatohepatitis) - Plan: US Abdomen Limited RUQ (LIVER/GB), Hepatitis B surface antigen, Hepatitis B surface antibody,quantitative, Hepatitis C antibody  Encounter for screening mammogram for malignant neoplasm of breast - Plan: MM Digital Screening  Diabetic ulcer of toe of right foot associated with diabetes mellitus due to underlying condition, limited to breakdown of skin ([place]) - Plan: Ambulatory referral to Podiatry  Type 2 diabetes mellitus with complications (  [place])  Morbid obesity ([place])  Encounter for Medicare annual wellness exam  Lymphedema  Essential hypertension Blood pressure today is excellent.  A1c is acceptable.  LDL cholesterol is acceptable.  Labs are significant only for chronic thrombocytopenia likely due to her underlying fatty liver disease.  Obtain right upper quadrant ultrasound to evaluate for cirrhosis.  Screen for hepatitis  B as well as hepatitis C.  Schedule mammogram.  Consult podiatry into the ulcer forming on her right great toe and necrosis.

## 2022-10-25 LAB — HEPATITIS C ANTIBODY: Hepatitis C Ab: NONREACTIVE

## 2022-10-25 LAB — HEPATITIS B SURFACE ANTIGEN: Hepatitis B Surface Ag: NONREACTIVE

## 2022-10-25 LAB — HEPATITIS B SURFACE ANTIBODY, QUANTITATIVE: Hep B S AB Quant (Post): <5 m[IU]/mL — ABNORMAL LOW (ref >=10)

## 2022-10-28 ENCOUNTER — Encounter: Payer: Self-pay | Admitting: Family Medicine

## 2022-10-30 ENCOUNTER — Ambulatory Visit: Payer: Medicare HMO | Admitting: Podiatry

## 2022-10-30 DIAGNOSIS — E11621 Type 2 diabetes mellitus with foot ulcer: Secondary | ICD-10-CM

## 2022-10-30 DIAGNOSIS — L97512 Non-pressure chronic ulcer of other part of right foot with fat layer exposed: Secondary | ICD-10-CM

## 2022-10-30 DIAGNOSIS — S90422A Blister (nonthermal), left great toe, initial encounter: Secondary | ICD-10-CM | POA: Diagnosis not present

## 2022-10-30 MED ORDER — CEPHALEXIN 500 MG PO CAPS: 500.0000 mg | ORAL_CAPSULE | Freq: Three times a day (TID) | ORAL | 0 refills | Status: AC

## 2022-10-30 NOTE — Progress Notes (Signed)
  Subjective:  Patient ID: Linda Briggs, female    DOB: Jan 18, 1956,  MRN: [id number]  Chief Complaint  Patient presents with   Diabetic Ulcer    Diabetic ulcer of toe of right foot associated with diabetes mellitus due to underlying condition, limited to breakdown of skin [facility])    67 y.o. female presents with the above complaint. History confirmed with patient.  She Billups a new issue with a blood blister on the right great toe and blistering on the left foot.  Started fairly recently.  Has had some serous drainage.  She has been trying not to disrupt the skin on the blister on the left foot.  Has not had any purulent drainage, says it has been clear yellow drainage.  Objective:  Physical Exam: warm, good capillary refill and she has diffuse peripheral neuropathy.  Difficult to palpate pedal pulses may be secondary to edema and lichenification and chronic lymphedema, she has a blood blister at the distal lateral tip of the right hallux as well as blister on the left medial first MTPJ, serous drainage, bulla is intact, there is mild erythema here but no purulence..  Assessment:   1. Diabetic ulcer of toe of right foot associated with type 2 diabetes mellitus, with fat layer exposed ([place])   2. Blister of left great toe, initial encounter      Plan:  Patient was evaluated and treated and all questions answered.  We reviewed my exam findings hopefully both lesions will be able to heal uneventfully.  I recommended reevaluating her circulatory status and referral order for ABIs was placed.  She is known to Dr. Lucky Cowboy at [facility] vein and vascular.  We will set up earlier follow-up with him if indicated on her testing.  I recommended local wound care with Betadine application to both lesions.  Keep left foot bandaged while in shoe gear.  May leave open to air at home.  She did have some early erythema I prescribed her Keflex we discussed her allergy to penicillin, she had hives when she was in  junior high school when she had it, does not think she has had it recently.  I discussed the possibility of worsening allergic reaction due to this and to take Benadryl if this develops and/or call 911 if any angioedema or SOB develops.  I will see her back in 3 to 4 weeks for follow-up   Return in about 4 weeks (around 11/27/2022) for wound check.

## 2022-10-31 ENCOUNTER — Telehealth: Payer: Self-pay | Admitting: Family Medicine

## 2022-10-31 NOTE — Telephone Encounter (Signed)
Patient lost her [place] and needs another one; requesting this one specifically.  Pharmacy needs a new script.  Pharmacy confirmed as  [facility] [place], [place] [address] [place], [place] [address] Phone: [phone]  Fax: [phone]    Requesting for script to be sent today if possible.   Please advise patient at [phone] (home), or [phone] (cell)

## 2022-11-01 ENCOUNTER — Other Ambulatory Visit: Payer: Self-pay | Admitting: Family Medicine

## 2022-11-01 ENCOUNTER — Other Ambulatory Visit: Payer: Self-pay

## 2022-11-01 DIAGNOSIS — E118 Type 2 diabetes mellitus with unspecified complications: Secondary | ICD-10-CM

## 2022-11-01 MED ORDER — BLOOD GLUCOSE MONITORING SUPPL DEVI: 1.0000 | Freq: Three times a day (TID) | 0 refills | Status: AC

## 2022-11-04 ENCOUNTER — Other Ambulatory Visit: Payer: Self-pay

## 2022-11-04 DIAGNOSIS — E11621 Type 2 diabetes mellitus with foot ulcer: Secondary | ICD-10-CM

## 2022-11-04 NOTE — Progress Notes (Signed)
Referral was sent today.

## 2022-11-12 ENCOUNTER — Ambulatory Visit (INDEPENDENT_AMBULATORY_CARE_PROVIDER_SITE_OTHER): Payer: Medicare HMO | Admitting: Nurse Practitioner

## 2022-11-13 ENCOUNTER — Ambulatory Visit (INDEPENDENT_AMBULATORY_CARE_PROVIDER_SITE_OTHER): Payer: Medicare HMO

## 2022-11-13 ENCOUNTER — Ambulatory Visit (INDEPENDENT_AMBULATORY_CARE_PROVIDER_SITE_OTHER): Payer: Medicare HMO | Admitting: Nurse Practitioner

## 2022-11-13 ENCOUNTER — Encounter (INDEPENDENT_AMBULATORY_CARE_PROVIDER_SITE_OTHER): Payer: Self-pay | Admitting: Nurse Practitioner

## 2022-11-13 VITALS — BP 117/79 | HR 69 | Resp 18 | Ht 66.0 in | Wt 281.6 lb

## 2022-11-13 DIAGNOSIS — S90422A Blister (nonthermal), left great toe, initial encounter: Secondary | ICD-10-CM | POA: Diagnosis not present

## 2022-11-13 DIAGNOSIS — E11621 Type 2 diabetes mellitus with foot ulcer: Secondary | ICD-10-CM | POA: Diagnosis not present

## 2022-11-13 DIAGNOSIS — E7800 Pure hypercholesterolemia, unspecified: Secondary | ICD-10-CM

## 2022-11-13 DIAGNOSIS — E118 Type 2 diabetes mellitus with unspecified complications: Secondary | ICD-10-CM

## 2022-11-13 DIAGNOSIS — L97512 Non-pressure chronic ulcer of other part of right foot with fat layer exposed: Secondary | ICD-10-CM

## 2022-11-16 ENCOUNTER — Other Ambulatory Visit: Payer: Self-pay | Admitting: Family Medicine

## 2022-11-18 LAB — VAS US ABI WITH/WO TBI
Left ABI: 1.12
Right ABI: 1.33

## 2022-11-23 ENCOUNTER — Other Ambulatory Visit: Payer: Self-pay | Admitting: Family Medicine

## 2022-11-25 ENCOUNTER — Other Ambulatory Visit: Payer: Self-pay | Admitting: Family Medicine

## 2022-11-25 ENCOUNTER — Ambulatory Visit
Admission: RE | Admit: 2022-11-25 | Discharge: 2022-11-25 | Disposition: A | Discharge status: Home / Self Care | Payer: Medicare HMO | Source: Ambulatory Visit | Attending: Family Medicine | Admitting: Family Medicine

## 2022-11-25 DIAGNOSIS — K7581 Nonalcoholic steatohepatitis (NASH): Secondary | ICD-10-CM

## 2022-11-25 DIAGNOSIS — K8020 Calculus of gallbladder without cholecystitis without obstruction: Secondary | ICD-10-CM | POA: Diagnosis not present

## 2022-11-25 NOTE — Telephone Encounter (Signed)
Requested medication (s) are due for refill today: Yes  Requested medication (s) are on the active medication list: Yes  Last refill:  10/24/22  Future visit scheduled: Yes  Notes to clinic:  Unable to refill per protocol, last refill by another provider.      Requested Prescriptions  Pending Prescriptions Disp Refills   lisinopril (ZESTRIL) 5 MG tablet [Pharmacy Med Name: LISINOPRIL 5 MG Tablet] 90 tablet 3    Sig: TAKE 1 TABLET EVERY DAY     Cardiovascular:  ACE Inhibitors Failed - 11/25/2022 12:40 PM      Failed - Valid encounter within last 6 months    Recent Outpatient Visits           1 year ago Type 2 diabetes mellitus with complications ([place])   [place] Pickard, Cammie Mcgee, MD   1 year ago Fever, unspecified fever cause   [place] Eulogio Bear, NP   1 year ago Type 2 diabetes mellitus with complications ([place])   [place] Susy Frizzle, MD   2 years ago Type 2 diabetes mellitus with complications ([place])   [facility] Susy Frizzle, MD   2 years ago Type 2 diabetes mellitus with complications ([place])   [place] Pickard, Cammie Mcgee, MD       Future Appointments             In 2 weeks Gollan, Kathlene November, MD [place] [place]   In 5 months Pickard, Cammie Mcgee, MD [facility]            Passed - Cr in normal range and within 180 days    Creat  Date Value Ref Range Status  09/27/2022 0.89 0.50 - 1.05 mg/dL Final         Passed - K in normal range and within 180 days    Potassium  Date Value Ref Range Status  09/27/2022 3.9 3.5 - 5.3 mmol/L Final         Passed - Patient is not pregnant      Passed - Last BP in normal range    BP Readings from Last 1 Encounters:  11/13/22 117/79         Signed Prescriptions Disp Refills   gabapentin (NEURONTIN) 300 MG capsule 270 capsule 3    Sig: TAKE 1 CAPSULE  THREE TIMES DAILY     Neurology: Anticonvulsants - gabapentin Failed - 11/25/2022 12:40 PM      Failed - Valid encounter within last 12 months    Recent Outpatient Visits           1 year ago Type 2 diabetes mellitus with complications ([place])   [place] Pickard, Cammie Mcgee, MD   1 year ago Fever, unspecified fever cause   [place] Eulogio Bear, NP   1 year ago Type 2 diabetes mellitus with complications ([place])   [place] Susy Frizzle, MD   2 years ago Type 2 diabetes mellitus with complications ([place])   [place] Susy Frizzle, MD   2 years ago Type 2 diabetes mellitus with complications ([place])   [facility] Pickard, Cammie Mcgee, MD       Future Appointments             In 2 weeks Gollan, Kathlene November, MD [place]  [facility] [place]   In 5 months Pickard, Cammie Mcgee, MD [facility]            Passed - Cr in normal range and within 360 days    Creat  Date Value Ref Range Status  09/27/2022 0.89 0.50 - 1.05 mg/dL Final         Passed - Completed PHQ-2 or PHQ-9 in the last 360 days

## 2022-11-25 NOTE — Telephone Encounter (Signed)
Requested Prescriptions  Pending Prescriptions Disp Refills   lisinopril (ZESTRIL) 5 MG tablet [Pharmacy Med Name: LISINOPRIL 5 MG Tablet] 90 tablet 3    Sig: TAKE 1 TABLET EVERY DAY     Cardiovascular:  ACE Inhibitors Failed - 11/25/2022 12:40 PM      Failed - Valid encounter within last 6 months    Recent Outpatient Visits           1 year ago Type 2 diabetes mellitus with complications ([place])   [place] Pickard, Cammie Mcgee, MD   1 year ago Fever, unspecified fever cause   [place] Eulogio Bear, NP   1 year ago Type 2 diabetes mellitus with complications ([place])   [place] Susy Frizzle, MD   2 years ago Type 2 diabetes mellitus with complications ([place])   [place] Susy Frizzle, MD   2 years ago Type 2 diabetes mellitus with complications ([place])   [place] Pickard, Cammie Mcgee, MD       Future Appointments             In 2 weeks Gollan, Kathlene November, MD [place] [facility] [place]   In 5 months Pickard, Cammie Mcgee, MD [facility]            Passed - Cr in normal range and within 180 days    Creat  Date Value Ref Range Status  09/27/2022 0.89 0.50 - 1.05 mg/dL Final         Passed - K in normal range and within 180 days    Potassium  Date Value Ref Range Status  09/27/2022 3.9 3.5 - 5.3 mmol/L Final         Passed - Patient is not pregnant      Passed - Last BP in normal range    BP Readings from Last 1 Encounters:  11/13/22 117/79          gabapentin (NEURONTIN) 300 MG capsule [Pharmacy Med Name: GABAPENTIN 300 MG Capsule] 270 capsule 3    Sig: TAKE [place]     Neurology: Anticonvulsants - gabapentin Failed - 11/25/2022 12:40 PM      Failed - Valid encounter within last 12 months    Recent Outpatient Visits           1 year ago Type 2 diabetes mellitus with complications ([place])   [place] Pickard, Cammie Mcgee, MD   1 year ago Fever, unspecified fever cause   [place] Eulogio Bear, NP   1 year ago Type 2 diabetes mellitus with complications ([place])   [place] Susy Frizzle, MD   2 years ago Type 2 diabetes mellitus with complications ([place])   [place] Susy Frizzle, MD   2 years ago Type 2 diabetes mellitus with complications ([place])   [place] Pickard, Cammie Mcgee, MD       Future Appointments             In 2 weeks Gollan, Kathlene November, MD [place] [facility] [place]   In 5 months Pickard, Cammie Mcgee, MD [place]            Passed - Cr in normal range and within 360 days    Creat  Date Value Ref  Range Status  09/27/2022 0.89 0.50 - 1.05 mg/dL Final         Passed - Completed PHQ-2 or PHQ-9 in the last 360 days

## 2022-11-25 NOTE — Telephone Encounter (Signed)
Patient called to follow up on refill requests sent from pharmacy for  gabapentin (NEURONTIN) 300 MG capsule   lisinopril (ZESTRIL) 5 MG tablet [id number]   Pharmacy confirmed as  [facility], [address], [place] [address] Phone: [phone]  Fax: [phone]    Please advise patient when refills sent at [phone]

## 2022-11-25 NOTE — Telephone Encounter (Signed)
Requested medication (s) are due for refill today: Yes  Requested medication (s) are on the active medication list: Yes  Last refill:  10/24/22  Future visit scheduled: Yes  Notes to clinic:  Unable to refill per protocol, last refill by another provider.      Requested Prescriptions  Pending Prescriptions Disp Refills   lisinopril (ZESTRIL) 5 MG tablet [Pharmacy Med Name: LISINOPRIL 5 MG Tablet] 90 tablet 3    Sig: TAKE 1 TABLET EVERY DAY     Cardiovascular:  ACE Inhibitors Failed - 11/25/2022 12:40 PM      Failed - Valid encounter within last 6 months    Recent Outpatient Visits           1 year ago Type 2 diabetes mellitus with complications ([place])   [place] Pickard, Cammie Mcgee, MD   1 year ago Fever, unspecified fever cause   [place] Eulogio Bear, NP   1 year ago Type 2 diabetes mellitus with complications ([place])   [place] Susy Frizzle, MD   2 years ago Type 2 diabetes mellitus with complications ([place])   [facility] Susy Frizzle, MD   2 years ago Type 2 diabetes mellitus with complications ([place])   [place] Pickard, Cammie Mcgee, MD       Future Appointments             In 2 weeks Gollan, Kathlene November, MD [facility]   In 5 months Pickard, Cammie Mcgee, MD [facility]            Passed - Cr in normal range and within 180 days    Creat  Date Value Ref Range Status  09/27/2022 0.89 0.50 - 1.05 mg/dL Final         Passed - K in normal range and within 180 days    Potassium  Date Value Ref Range Status  09/27/2022 3.9 3.5 - 5.3 mmol/L Final         Passed - Patient is not pregnant      Passed - Last BP in normal range    BP Readings from Last 1 Encounters:  11/13/22 117/79         Signed Prescriptions Disp Refills   gabapentin (NEURONTIN) 300 MG capsule 270 capsule 3    Sig: TAKE 1 CAPSULE  THREE TIMES DAILY     Neurology: Anticonvulsants - gabapentin Failed - 11/25/2022 12:40 PM      Failed - Valid encounter within last 12 months    Recent Outpatient Visits           1 year ago Type 2 diabetes mellitus with complications ([place])   [place] Pickard, Cammie Mcgee, MD   1 year ago Fever, unspecified fever cause   [place] Eulogio Bear, NP   1 year ago Type 2 diabetes mellitus with complications ([place])   [place] Susy Frizzle, MD   2 years ago Type 2 diabetes mellitus with complications ([place])   [place] Susy Frizzle, MD   2 years ago Type 2 diabetes mellitus with complications ([place])   [facility] Pickard, Cammie Mcgee, MD       Future Appointments             In 2 weeks Gollan, Kathlene November, MD [facility]   In 5 months Pickard, Cammie Mcgee, MD [facility]            Passed - Cr in normal range and within 360 days    Creat  Date Value Ref Range Status  09/27/2022 0.89 0.50 - 1.05 mg/dL Final         Passed - Completed PHQ-2 or PHQ-9 in the last 360 days

## 2022-11-25 NOTE — Telephone Encounter (Signed)
Unable to refill per protocol, last refill by another provider.

## 2022-11-26 ENCOUNTER — Other Ambulatory Visit: Payer: Self-pay | Admitting: Family Medicine

## 2022-11-27 ENCOUNTER — Ambulatory Visit: Payer: Medicare HMO | Admitting: Podiatry

## 2022-11-27 DIAGNOSIS — L97522 Non-pressure chronic ulcer of other part of left foot with fat layer exposed: Secondary | ICD-10-CM | POA: Diagnosis not present

## 2022-11-27 NOTE — Progress Notes (Signed)
  Subjective:  Patient ID: Linda Briggs, female    DOB: [birth year],  MRN: [id number]  Chief Complaint  Patient presents with   Diabetic Ulcer    wound check 4 weeks right toe    67 y.o. female presents with the above complaint. History confirmed with patient.  The toe on the right foot is doing better the blister on the left foot has progressed into an ulceration.  She did see the vascular specialist and they told her that her circulation was good  Objective:  Physical Exam: warm, good capillary refill, normal DP and PT pulses, and diffuse neuropathy, full-thickness ulceration on medial left first MPJ measuring 1.4 x 0.5 x 0.3 cm, exposed subcutaneous tissue, surrounding macerated hyperkeratosis, serous drainage no purulent signs infection erythema or cellulitis.  No exposed bone tendon or joint.  Right foot ulcer has healed at nail site.     ABIs without significant atherosclerosis Assessment:   1. Ulcer of left foot with fat layer exposed ([place])      Plan:  Patient was evaluated and treated and all questions answered.  Ulcer left foot -We discussed the etiology and factors that are a part of the wound healing process.  We also discussed the risk of infection both soft tissue and osteomyelitis from open ulceration.  Discussed the risk of limb loss if this happens or worsens. -Debridement as below. -Dressed with Iodosorb, DSD. -Continue home dressing changes daily with telfa and mupirocin.  Plan to transition to collagen dressing at next visit   Procedure: Excisional Debridement of Wound Rationale: Removal of non-viable soft tissue from the wound to promote healing.  Anesthesia: none Post-Debridement Wound Measurements: Noted above Type of Debridement: Sharp Excisional Tissue Removed: Non-viable soft tissue Depth of Debridement: subcutaneous tissue. Technique: Sharp excisional debridement to bleeding, viable wound base.  Dressing: Dry, sterile, compression  dressing. Disposition: Patient tolerated procedure well.       No follow-ups on file.

## 2022-12-03 ENCOUNTER — Encounter (INDEPENDENT_AMBULATORY_CARE_PROVIDER_SITE_OTHER): Payer: Self-pay | Admitting: Nurse Practitioner

## 2022-12-03 NOTE — Progress Notes (Signed)
Subjective:    Patient ID: Linda Briggs, female    DOB: 1956-05-10, 67 y.o.   MRN: [id number] Chief Complaint  Patient presents with   Follow-up    f/u consult; Diabetic ulcer of toe of right foot associated with type 2 diabetes mellitus, with fat layer exposed    Linda Briggs is a 67 year old female who presents as a referral from Dr. Sherryle Lis in regards to a right lower extremity diabetic ulcer.  The patient has a history of hypertension hyperlipidemia.  We also see the patient for lymphedema as well.  Since the referral was made she has also had a small wound on her left foot as well.  Initially these began as small blisters.  She denies any claudication-like symptoms.  She denies any rest pain.  She does have known lymphedema which currently appears to be well-controlled.  Today noninvasive studies show an ABI of 1.33 on the right and 1.12 on the left.  The right TBI is 1.01 in the left and 0.91.  The patient has good triphasic tibial artery waveforms on the right with triphasic/biphasic waveforms on the left.  The patient has good toe waveforms bilaterally.    Review of Systems  Skin:  Positive for wound.  All other systems reviewed and are negative.      Objective:   Physical Exam Vitals reviewed.  HENT:     Head: Normocephalic.  Cardiovascular:     Rate and Rhythm: Normal rate.     Pulses:          Dorsalis pedis pulses are detected w/ Doppler on the right side and detected w/ Doppler on the left side.       Posterior tibial pulses are detected w/ Doppler on the right side and detected w/ Doppler on the left side.  Skin:    General: Skin is warm and dry.  Neurological:     Mental Status: She is alert and oriented to person, place, and time.  Psychiatric:        Mood and Affect: Mood normal.        Behavior: Behavior normal.        Thought Content: Thought content normal.        Judgment: Judgment normal.     BP 117/79 (BP Location: Right Arm)   Pulse 69    Resp 18   Ht 5\' 6"  (1.676 m)   Wt 281 lb 9.6 oz (127.7 kg)   BMI 45.45 kg/m   Past Medical History:  Diagnosis Date   Arthritis    feet & toes   Bulging disc    Diabetes mellitus    Gastroparesis    Hyperlipidemia    Hypertension    Neuromuscular disorder    in feet and fingers    Neuropathy    feet   Nonproliferative diabetic retinopathy    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    Peripheral vascular disease    Shortness of breath dyspnea    on lopressor    Sleep apnea    pt had study done but it was incomplete due to blood sugar issues     Social History   Socioeconomic History   Marital status: Single    Spouse name: Not on file   Number of children: 0   Years of education: Not on file   Highest education level: Not on file  Occupational History   Occupation: retired  Tobacco Use   Smoking status: Never  Smokeless tobacco: Never  Vaping Use   Vaping Use: Never used  Substance and Sexual Activity   Alcohol use: No    Comment: social drinker.   Drug use: No   Sexual activity: Not Currently  Other Topics Concern   Not on file  Social History Narrative   Lives alone. 1 brother living.    Social Determinants of Health   Financial Resource Strain: Low Risk  (08/14/2022)   Overall Financial Resource Strain (CARDIA)    Difficulty of Paying Living Expenses: Not hard at all  Food Insecurity: No Food Insecurity (08/14/2022)   Hunger Vital Sign    Worried About Running Out of Food in the Last Year: Never true    Ran Out of Food in the Last Year: Never true  Transportation Needs: No Transportation Needs (08/14/2022)   PRAPARE - Hydrologist (Medical): No    Lack of Transportation (Non-Medical): No  Physical Activity: Insufficiently Active (08/14/2022)   Exercise Vital Sign    Days of Exercise per Week: 3 days    Minutes of Exercise per Session: 30 min  Stress: No Stress Concern Present (08/14/2022)   [place]    Feeling of Stress : Not at all  Social Connections: Moderately Integrated (08/14/2022)   Social Connection and Isolation Panel [NHANES]    Frequency of Communication with Friends and Family: More than three times a week    Frequency of Social Gatherings with Friends and Family: Three times a week    Attends Religious Services: 1 to 4 times per year    Active Member of Clubs or Organizations: Yes    Attends Archivist Meetings: More than 4 times per year    Marital Status: Never married  Intimate Partner Violence: Not At Risk (08/14/2022)   Humiliation, Afraid, Rape, and Kick questionnaire    Fear of Current or Ex-Partner: No    Emotionally Abused: No    Physically Abused: No    Sexually Abused: No    Past Surgical History:  Procedure Laterality Date   BIOPSY  04/28/2018   Procedure: BIOPSY;  Surgeon: Ladene Artist, MD;  Location: [facility];  Service: Endoscopy;;   BIOPSY THYROID     january 2016   ESOPHAGOGASTRODUODENOSCOPY (EGD) WITH PROPOFOL N/A 04/28/2018   Procedure: ESOPHAGOGASTRODUODENOSCOPY (EGD) WITH PROPOFOL;  Surgeon: Ladene Artist, MD;  Location: [facility];  Service: Endoscopy;  Laterality: N/A;   HYSTEROSCOPY WITH D & C N/A 02/15/2015   Procedure: DILATATION AND CURETTAGE /HYSTEROSCOPY;  Surgeon: Linda Hedges, DO;  Location: [facility];  Service: Gynecology;  Laterality: N/A;   TONSILLECTOMY      Family History  Problem Relation Age of Onset   Hyperlipidemia Mother    Heart disease Father    Hyperlipidemia Father    Hypertension Father    Heart disease Brother    Kidney disease Sister    Kidney cancer Sister    Ovarian cancer Maternal Aunt    Kidney disease Maternal Aunt        x 2   Kidney disease Maternal Uncle        x 2   Stroke Brother    Heart disease Brother    Colon cancer Neg Hx    Stomach cancer Neg Hx     Allergies  Allergen Reactions   Codeine Anaphylaxis and Hives    Penicillins Anaphylaxis, Hives and Other (See Comments)  Has patient had a PCN reaction causing immediate rash, facial/tongue/throat swelling, SOB or lightheadedness with hypotension: No Has patient had a PCN reaction causing severe rash involving mucus membranes or skin necrosis: No Has patient had a PCN reaction that required hospitalization: No Has patient had a PCN reaction occurring within the last 10 years: No If all of the above answers are "NO", then may proceed with Cephalosporin use.    Ciprofloxacin Other (See Comments)    Tongue Swelling   Food Nausea And Vomiting and Other (See Comments)    Olives    Januvia [Sitagliptin] Other (See Comments)    Stomach cramps   Latex Hives       Latest Ref Rng & Units 09/27/2022    2:00 PM 03/18/2022   10:28 AM 09/19/2021   11:34 AM  CBC  WBC 3.8 - 10.8 Thousand/uL 7.5  7.8  9.8   Hemoglobin 11.7 - 15.5 g/dL 13.5  13.9  14.1   Hematocrit 35.0 - 45.0 % 38.7  40.2  41.6   Platelets 140 - 400 Thousand/uL 104  90  106       CMP     Component Value Date/Time   NA 141 09/27/2022 1400   K 3.9 09/27/2022 1400   CL 105 09/27/2022 1400   CO2 25 09/27/2022 1400   GLUCOSE 113 (H) 09/27/2022 1400   BUN 17 09/27/2022 1400   CREATININE 0.89 09/27/2022 1400   CALCIUM 9.1 09/27/2022 1400   PROT 6.3 09/27/2022 1400   ALBUMIN 3.9 02/10/2017 1217   AST 13 09/27/2022 1400   ALT 12 09/27/2022 1400   ALKPHOS 69 02/10/2017 1217   BILITOT 0.5 09/27/2022 1400   GFRNONAA 59 (L) 09/25/2020 1138   GFRAA 69 09/25/2020 1138     VAS [place] ABI WITH/WO TBI  Result Date: 11/18/2022  LOWER EXTREMITY DOPPLER STUDY Patient Name:  IZUMI CUADRADO  Date of Exam:   11/13/2022 Medical Rec #: [id number]        Accession #:    [id number] Date of Birth: 12/21/1955        Patient Gender: F Patient Age:   84 years Exam Location:  [facility] Procedure:      VAS [place] ABI WITH/WO TBI Referring Phys: ADAM MCDONALD  --------------------------------------------------------------------------------  Indications: Ulceration. High Risk Factors: Hypertension, hyperlipidemia, Diabetes, no history of                    smoking.  Performing Technologist: Delorise Shiner RVT  Examination Guidelines: A complete evaluation includes at minimum, Doppler waveform signals and systolic blood pressure reading at the level of bilateral brachial, anterior tibial, and posterior tibial arteries, when vessel segments are accessible. Bilateral testing is considered an integral part of a complete examination. Photoelectric Plethysmograph (PPG) waveforms and toe systolic pressure readings are included as required and additional duplex testing as needed. Limited examinations for reoccurring indications may be performed as noted.  ABI Findings: +---------+------------------+-----+---------+--------+ Right    Rt Pressure (mmHg)IndexWaveform Comment  +---------+------------------+-----+---------+--------+ Brachial 139                                      +---------+------------------+-----+---------+--------+ PTA      185               1.33 triphasic         +---------+------------------+-----+---------+--------+ DP       166  1.19 triphasic         +---------+------------------+-----+---------+--------+ Great Toe141               1.01                   +---------+------------------+-----+---------+--------+ +---------+------------------+-----+--------+-------+ Left     Lt Pressure (mmHg)IndexWaveformComment +---------+------------------+-----+--------+-------+ Brachial 132                                    +---------+------------------+-----+--------+-------+ PERO     146               1.05 biphasic        +---------+------------------+-----+--------+-------+ DP       156               1.12                 +---------+------------------+-----+--------+-------+ Great Toe126                0.91                 +---------+------------------+-----+--------+-------+ +-------+-----------+-----------+----------------+------------+ ABI/TBIToday's ABIToday's TBIPrevious ABI    Previous TBI +-------+-----------+-----------+----------------+------------+ Right  1.33       1.01       Non compressible0.94         +-------+-----------+-----------+----------------+------------+ Left   1.12       0.91       Non compressible1.03         +-------+-----------+-----------+----------------+------------+  Arterial wall calcification precludes accurate ankle pressures and ABIs. Bilateral ABIs appear essentially unchanged compared to prior study on 08/28/2020.  Summary: Right: Resting right ankle-brachial index indicates noncompressible right lower extremity arteries. The right toe-brachial index is normal. Left: Resting left ankle-brachial index is within normal range. The left toe-brachial index is normal. *See table(s) above for measurements and observations.  Electronically signed by Leotis Pain MD on 11/18/2022 at 12:54:24 PM.    Final        Assessment & Plan:   1. Pure hypercholesterolemia Continue statin as ordered and reviewed, no changes at this time  2. Type 2 diabetes mellitus with complications ([facility]) Continue hypoglycemic medications as already ordered, these medications have been reviewed and there are no changes at this time.  Hgb A1C to be monitored as already arranged by primary service  3. Ulcerated, foot, right, with fat layer exposed ([place]) Based on noninvasive studies patient should have ability for wound healing.  Patient will continue to follow-up with podiatry.  If the patient continues to have delayed wound healing or issues with healing we can reevaluate and may need to progress to more invasive options.  Current Outpatient Medications on File Prior to Visit  Medication Sig Dispense Refill   Accu-Chek Softclix Lancets lancets TEST BLOOD SUGAR THREE TIMES DAILY  300 each 3   Alcohol Swabs (DROPSAFE ALCOHOL PREP) 70 % PADS USE THREE TIMES DAILY 300 each 3   aspirin EC 81 MG tablet Take 81 mg by mouth daily.     Blood Glucose Calibration (TRUE METRIX LEVEL 2) Normal SOLN Use as directed to monitor FSBS 3x daily. Dx: E11.65 1 each 1   Blood Glucose Monitoring Suppl DEVI 1 each by Does not apply route in the morning, at noon, and at bedtime. May substitute to any manufacturer covered by patient's insurance. 1 each 0   Dulaglutide (TRULICITY) 3 0000000 SOPN Inject 3 mg as directed once a week.  6 mL 3   FARXIGA 10 MG TABS tablet TAKE 1 TABLET EVERY DAY BEFORE BREAKFAST 90 tablet 3   metFORMIN (GLUCOPHAGE) 1000 MG tablet TAKE 1 TABLET TWICE DAILY WITH MEALS 180 tablet 3   metoprolol tartrate (LOPRESSOR) 50 MG tablet Take 0.5 tablets (25 mg total) by mouth 2 (two) times daily. 90 tablet 0   Multiple Vitamin (MULTIVITAMIN) tablet Take 1 tablet by mouth daily.     ondansetron (ZOFRAN) 4 MG tablet TAKE 1 TABLET (4 MG TOTAL) BY MOUTH EVERY 8 (EIGHT) HOURS AS NEEDED FOR NAUSEA OR VOMITING. 60 tablet 3   potassium chloride (KLOR-CON M) 10 MEQ tablet TAKE 1 TABLET EVERY DAY. (NEED MD APPOINTMENT) 30 tablet 3   pravastatin (PRAVACHOL) 40 MG tablet Take 1 tablet (40 mg total) by mouth daily. 90 tablet 3   predniSONE (DELTASONE) 20 MG tablet 3 tabs poqday 1-2, 2 tabs poqday 3-4, 1 tab poqday 5-6 12 tablet 0   traMADol (ULTRAM) 50 MG tablet TAKE 1 TABLET BY MOUTH EVERY 6 HOURS AS NEEDED FOR  CHRONIC  BACK  PAIN 60 tablet 0   No current facility-administered medications on file prior to visit.    There are no Patient Instructions on file for this visit. No follow-ups on file.   Kris Hartmann, NP

## 2022-12-06 ENCOUNTER — Telehealth: Payer: Self-pay | Admitting: Family Medicine

## 2022-12-06 NOTE — Telephone Encounter (Signed)
Patient returned your call. Requesting call back. Please advise at [phone].

## 2022-12-09 NOTE — Progress Notes (Unsigned)
Cardiology Office Note  Date:  12/10/2022   ID:  Linda Briggs, DOB 11/07/1955, MRN [id number]  PCP:  Donita BrooksPickard, Warren T, MD   Chief Complaint  Patient presents with   Follow-up    1 year f/u.  Sharp pain in mid chest episodes that happen twice a month.    HPI:  Linda Briggs is a very pleasant 67 year old woman with  diabetes, on insulin.  hemoglobin A1c  5.9  Episodes of sinus tachycardia morbid obesity,  hyperlipidemia,  hypertension,  chronic lower extremity swelling,  Lymphedema, has pumps dizziness.   Neuropathy in her feet Mom with dementia, lives with her Covid 12/21 She presents for routine followup of her hypertension,  leg swelling, sinus tachycardia  Last seen by myself in clinic December 2022  Uses a cane 2x a month with sharp pain in chest,  Lasts a few seconds Last episode was approximately 2 months ago  Followed by Dr. Wyn Quakerew for leg swelling/lymphedema Not using her lymphedema compression pumps on a regular basis, will use it for 1 hour several times per week  Recently reported taking high-dose ibuprofen for arthritis, h/a  Gait instability, uses a cane, no falls  Continues to take metoprolol tartrate 25 twice daily  Lab work reviewed A1C 6.6 Total chol 151 LDL 97 Plts 104, normal LFTs  prior carotid ultrasound showing minimal disease.   EKG personally reviewed by myself on todays visit Shows normal sinus rhythm rate 76 bpm left axis deviation no significant ST-T wave changes  Other past medical hx reviewed  Workup for covid 2021 08/31/20 Pharmacological myocardial perfusion imaging study with no significant  ischemia Normal wall motion, EF estimated at 78% No EKG changes concerning for ischemia at peak stress or in recovery. CT attenuation correction images with mild aortic atherosclerosis, minimal coronary calcification Low risk scan  Echo 08/16/20 1. Left ventricular ejection fraction, by estimation, is 55 to 60%. The  left ventricle has  normal function. The left ventricle has no regional  wall motion abnormalities. Left ventricular diastolic parameters are  consistent with Grade II diastolic  dysfunction (pseudonormalization).   2. Right ventricular systolic function is normal. The right ventricular  size is normal. There is normal pulmonary artery systolic pressure.   3. The pericardial effusion is posterior and lateral to the left  ventricle.   4. The mitral valve is grossly normal. No evidence of mitral valve  regurgitation.   5. The aortic valve was not well visualized. Aortic valve regurgitation  is not visualized.   6. The inferior vena cava is normal in size with greater than 50%  respiratory variability, suggesting right atrial pressure of 3 mmHg.    Holter monitor in the past that showed elevated baseline heart rate with frequent periods of sinus tachycardia. Improvement in dizzy episodes and fluttering with metoprolol.   PMH:   has a past medical history of Arthritis, Bulging disc, Diabetes mellitus, Gastroparesis, Hyperlipidemia, Hypertension, Neuromuscular disorder, Neuropathy, Nonproliferative diabetic retinopathy, Periodic heart flutter, Peripheral vascular disease, Shortness of breath dyspnea, and Sleep apnea.  PSH:    Past Surgical History:  Procedure Laterality Date   BIOPSY  04/28/2018   Procedure: BIOPSY;  Surgeon: Meryl DareStark, Malcolm T, MD;  Location: [facility];  Service: Endoscopy;;   BIOPSY THYROID     january 2016   ESOPHAGOGASTRODUODENOSCOPY (EGD) WITH PROPOFOL N/A 04/28/2018   Procedure: ESOPHAGOGASTRODUODENOSCOPY (EGD) WITH PROPOFOL;  Surgeon: Meryl DareStark, Malcolm T, MD;  Location: [facility];  Service: Endoscopy;  Laterality: N/A;   HYSTEROSCOPY  WITH D & C N/A 02/15/2015   Procedure: DILATATION AND CURETTAGE /HYSTEROSCOPY;  Surgeon: Mitchel Honour, DO;  Location: [facility];  Service: Gynecology;  Laterality: N/A;   TONSILLECTOMY      Outpatient Encounter Medications as of 12/10/2022  Medication Sig    ACCU-CHEK GUIDE test strip TEST BLOOD SUGAR THREE TIMES DAILY.   Accu-Chek Softclix Lancets lancets TEST BLOOD SUGAR THREE TIMES DAILY   Alcohol Swabs (DROPSAFE ALCOHOL PREP) 70 % PADS USE THREE TIMES DAILY   Blood Glucose Calibration (TRUE METRIX LEVEL 2) Normal SOLN Use as directed to monitor FSBS 3x daily. Dx: E11.65   Blood Glucose Monitoring Suppl DEVI 1 each by Does not apply route in the morning, at noon, and at bedtime. May substitute to any manufacturer covered by patient's insurance.   Dulaglutide (TRULICITY) 3 MG/0.5ML SOPN Inject 3 mg as directed once a week.   FARXIGA 10 MG TABS tablet TAKE 1 TABLET EVERY DAY BEFORE BREAKFAST   furosemide (LASIX) 20 MG tablet TAKE 1 TABLET EVERY DAY   gabapentin (NEURONTIN) 300 MG capsule TAKE 1 CAPSULE THREE TIMES DAILY   lisinopril (ZESTRIL) 5 MG tablet TAKE 1 TABLET EVERY DAY   metFORMIN (GLUCOPHAGE) 1000 MG tablet TAKE 1 TABLET TWICE DAILY WITH MEALS   metoprolol tartrate (LOPRESSOR) 50 MG tablet Take 0.5 tablets (25 mg total) by mouth 2 (two) times daily.   Multiple Vitamin (MULTIVITAMIN) tablet Take 1 tablet by mouth daily.   ondansetron (ZOFRAN) 4 MG tablet TAKE 1 TABLET (4 MG TOTAL) BY MOUTH EVERY 8 (EIGHT) HOURS AS NEEDED FOR NAUSEA OR VOMITING.   pantoprazole (PROTONIX) 40 MG tablet TAKE 1 TABLET TWICE DAILY BEFORE MEALS   potassium chloride (KLOR-CON M) 10 MEQ tablet TAKE 1 TABLET EVERY DAY. (NEED MD APPOINTMENT)   pravastatin (PRAVACHOL) 40 MG tablet Take 1 tablet (40 mg total) by mouth daily.   traMADol (ULTRAM) 50 MG tablet TAKE 1 TABLET BY MOUTH EVERY 6 HOURS AS NEEDED FOR  CHRONIC  BACK  PAIN   aspirin EC 81 MG tablet Take 81 mg by mouth daily. (Patient not taking: Reported on 12/10/2022)   predniSONE (DELTASONE) 20 MG tablet 3 tabs poqday 1-2, 2 tabs poqday 3-4, 1 tab poqday 5-6 (Patient not taking: Reported on 12/10/2022)   No facility-administered encounter medications on file as of 12/10/2022.   Allergies:   Codeine, Penicillins,  Ciprofloxacin, Food, Januvia [sitagliptin], and Latex   Social History:  The patient  reports that she has never smoked. She has never used smokeless tobacco. She reports that she does not drink alcohol and does not use drugs.   Family History:   family history includes Heart disease in her brother, brother, and father; Hyperlipidemia in her father and mother; Hypertension in her father; Kidney cancer in her sister; Kidney disease in her maternal aunt, maternal uncle, and sister; Ovarian cancer in her maternal aunt; Stroke in her brother.    Review of Systems: Review of Systems  Constitutional: Negative.   HENT: Negative.    Respiratory: Negative.    Cardiovascular:  Positive for leg swelling.  Gastrointestinal: Negative.   Musculoskeletal: Negative.        Leg weakness, balance instability  Neurological: Negative.   Psychiatric/Behavioral: Negative.    All other systems reviewed and are negative.   PHYSICAL EXAM: VS:  BP 120/60 (BP Location: Left Arm, Patient Position: Sitting)   Pulse 76   Ht 5\' 6"  (1.676 m)   Wt 279 lb 9.6 oz (126.8 kg)  SpO2 94%   BMI 45.13 kg/m  , BMI Body mass index is 45.13 kg/m. Constitutional:  oriented to person, place, and time. No distress.  HENT:  Head: Grossly normal Eyes:  no discharge. No scleral icterus.  Neck: No JVD, no carotid bruits  Cardiovascular: Regular rate and rhythm, no murmurs appreciated Chronic woody leg edema bilaterally Pulmonary/Chest: Clear to auscultation bilaterally, no wheezes or rails Abdominal: Soft.  no distension.  no tenderness.  Musculoskeletal: Normal range of motion Neurological:  normal muscle tone. Coordination normal. No atrophy Skin: Skin warm and dry Psychiatric: normal affect, pleasant   Recent Labs: 09/27/2022: ALT 12; BUN 17; Creat 0.89; Hemoglobin 13.5; Platelets 104; Potassium 3.9; Sodium 141; TSH 1.93    Lipid Panel Lab Results  Component Value Date   CHOL 151 09/27/2022   HDL 29 (L)  09/27/2022   LDLCALC 97 09/27/2022   TRIG 153 (H) 09/27/2022      Wt Readings from Last 3 Encounters:  12/10/22 279 lb 9.6 oz (126.8 kg)  11/13/22 281 lb 9.6 oz (127.7 kg)  10/24/22 287 lb (130.2 kg)      ASSESSMENT AND PLAN:  Shortness of breath - Morbid obesity, deconditioning Weight trending down Recommended calorie restriction, walking program  Type 2 diabetes mellitus with complication, with long-term current use of insulin (HCC) - Plan: EKG 12-Lead We have encouraged continued exercise, careful diet management in an effort to lose weight. Recommended avoiding sweet tea, sodas Continue lisinopril, blood pressure stable  Paroxysmal tachycardia Controlled on metoprolol tartrate 25 twice daily  Essential hypertension - Plan: EKG 12-Lead Stay on metoprolol, rate and rhythm well controlled Blood pressure numbers well controlled  Pure hypercholesterolemia - Plan: EKG 12-Lead Stay on pravastain, cholesterol numbers slightly above goal  Localized edema - Plan: EKG 12-Lead Chronic venous stasis/lymphedema Needs ace wraps, recommend she increase use of her compression pumps Followed by Dr. Wyn Quaker  Morbid obesity [facility]) Unable to exercise Stressed importance of low carbohydrates   Total encounter time more than 25 minutes  Greater than 50% was spent in counseling and coordination of care with the patient    Orders Placed This Encounter  Procedures   EKG 12-Lead     Signed, Dossie Arbour, M.D., Ph.D. 12/10/2022  [facility] [place], [place] [phone]

## 2022-12-10 ENCOUNTER — Encounter: Payer: Self-pay | Admitting: Cardiovascular Disease

## 2022-12-10 ENCOUNTER — Ambulatory Visit: Payer: Medicare HMO | Attending: Cardiovascular Disease | Admitting: Cardiovascular Disease

## 2022-12-10 VITALS — BP 120/60 | HR 76 | Ht 66.0 in | Wt 279.6 lb

## 2022-12-10 DIAGNOSIS — I10 Essential (primary) hypertension: Secondary | ICD-10-CM | POA: Diagnosis not present

## 2022-12-10 DIAGNOSIS — I890 Lymphedema, not elsewhere classified: Secondary | ICD-10-CM | POA: Diagnosis not present

## 2022-12-10 DIAGNOSIS — Z794 Long term (current) use of insulin: Secondary | ICD-10-CM | POA: Diagnosis not present

## 2022-12-10 DIAGNOSIS — R0609 Other forms of dyspnea: Secondary | ICD-10-CM

## 2022-12-10 DIAGNOSIS — E119 Type 2 diabetes mellitus without complications: Secondary | ICD-10-CM | POA: Diagnosis not present

## 2022-12-10 DIAGNOSIS — E782 Mixed hyperlipidemia: Secondary | ICD-10-CM | POA: Diagnosis not present

## 2022-12-10 DIAGNOSIS — R000 Tachycardia, unspecified: Secondary | ICD-10-CM

## 2022-12-10 DIAGNOSIS — I479 Paroxysmal tachycardia, unspecified: Secondary | ICD-10-CM | POA: Diagnosis not present

## 2022-12-10 NOTE — Patient Instructions (Signed)
Medication Instructions:  No changes  If you need a refill on your cardiac medications before your next appointment, please call your pharmacy.   Lab work: No new labs needed  Testing/Procedures: No new testing needed  Follow-Up: At [facility], you and your health needs are our priority.  As part of our continuing mission to provide you with exceptional heart care, we have created designated Provider Care Teams.  These Care Teams include your primary Cardiologist (physician) and Advanced Practice Providers (APPs -  Physician Assistants and Nurse Practitioners) who all work together to provide you with the care you need, when you need it.  You will need a follow up appointment in 12 months  Providers on your designated Care Team:   Christopher Berge, NP Ryan Dunn, PA-C Cadence Furth, PA-C  COVID-19 Vaccine Information can be found at: [email or web address] For questions related to vaccine distribution or appointments, please email [email or web address] or call [phone].   

## 2022-12-16 ENCOUNTER — Encounter: Payer: Self-pay | Admitting: Podiatry

## 2022-12-16 ENCOUNTER — Ambulatory Visit: Payer: Medicare HMO | Admitting: Podiatry

## 2022-12-16 VITALS — BP 99/63 | HR 73

## 2022-12-16 DIAGNOSIS — L97522 Non-pressure chronic ulcer of other part of left foot with fat layer exposed: Secondary | ICD-10-CM | POA: Diagnosis not present

## 2022-12-16 NOTE — Progress Notes (Signed)
  Subjective:  Patient ID: Linda Briggs, female    DOB: 1955-09-29,  MRN: [id number]  Chief Complaint  Patient presents with   Diabetic Ulcer    wound check 4 weeks right toe    67 y.o. female presents with the above complaint. History confirmed with patient.  She feels he is doing somewhat better  Objective:  Physical Exam: warm, good capillary refill, normal DP and PT pulses, and diffuse neuropathy, full-thickness ulceration on medial left first MPJ measuring 1.2 x 0.4 x 0.3 cm, exposed subcutaneous tissue, surrounding macerated hyperkeratosis, serous drainage no purulent signs infection erythema or cellulitis.  No exposed bone tendon or joint.  Right foot ulcer has healed at nail site.     ABIs without significant atherosclerosis Assessment:   1. Ulcer of left foot with fat layer exposed (HCC)      Plan:  Patient was evaluated and treated and all questions answered.  Ulcer left foot -We discussed the etiology and factors that are a part of the wound healing process.  We also discussed the risk of infection both soft tissue and osteomyelitis from open ulceration.  Discussed the risk of limb loss if this happens or worsens. -Debridement as below. -Dressed with Prisma and bandage, begin changing this daily with this. -Return in 4 weeks for follow-up.   Procedure: Excisional Debridement of Wound Rationale: Removal of non-viable soft tissue from the wound to promote healing.  Anesthesia: none Post-Debridement Wound Measurements: Noted above Type of Debridement: Sharp Excisional Tissue Removed: Non-viable soft tissue Depth of Debridement: subcutaneous tissue. Technique: Sharp excisional debridement to bleeding, viable wound base.  Dressing: Dry, sterile, compression dressing. Disposition: Patient tolerated procedure well.       No follow-ups on file.

## 2022-12-18 ENCOUNTER — Other Ambulatory Visit: Payer: Self-pay | Admitting: Cardiovascular Disease

## 2022-12-18 ENCOUNTER — Other Ambulatory Visit: Payer: Self-pay | Admitting: Family Medicine

## 2022-12-19 DIAGNOSIS — H40013 Open angle with borderline findings, low risk, bilateral: Secondary | ICD-10-CM | POA: Diagnosis not present

## 2022-12-19 DIAGNOSIS — E113393 Type 2 diabetes mellitus with moderate nonproliferative diabetic retinopathy without macular edema, bilateral: Secondary | ICD-10-CM | POA: Diagnosis not present

## 2022-12-19 DIAGNOSIS — H25813 Combined forms of age-related cataract, bilateral: Secondary | ICD-10-CM | POA: Diagnosis not present

## 2022-12-19 DIAGNOSIS — H353132 Nonexudative age-related macular degeneration, bilateral, intermediate dry stage: Secondary | ICD-10-CM | POA: Diagnosis not present

## 2022-12-19 LAB — HM DIABETES EYE EXAM

## 2022-12-25 ENCOUNTER — Encounter: Payer: Self-pay | Admitting: Family Medicine

## 2022-12-26 DIAGNOSIS — K7581 Nonalcoholic steatohepatitis (NASH): Secondary | ICD-10-CM | POA: Insufficient documentation

## 2022-12-26 DIAGNOSIS — Z8619 Personal history of other infectious and parasitic diseases: Secondary | ICD-10-CM | POA: Insufficient documentation

## 2023-01-09 ENCOUNTER — Other Ambulatory Visit: Payer: Self-pay | Admitting: Family Medicine

## 2023-01-09 NOTE — Telephone Encounter (Signed)
Requested Prescriptions  Pending Prescriptions Disp Refills   pravastatin (PRAVACHOL) 40 MG tablet [Pharmacy Med Name: PRAVASTATIN SODIUM 40 MG Tablet] 90 tablet 0    Sig: TAKE 1 TABLET EVERY DAY     Cardiovascular:  Antilipid - Statins Failed - 01/09/2023  1:54 AM      Failed - Valid encounter within last 12 months    Recent Outpatient Visits           1 year ago Type 2 diabetes mellitus with complications (HCC)   [facility] Pickard, Priscille Heidelberg, MD   1 year ago Fever, unspecified fever cause   [facility] Valentino Nose, NP   1 year ago Type 2 diabetes mellitus with complications (HCC)   [facility] Donita Brooks, MD   2 years ago Type 2 diabetes mellitus with complications (HCC)   [facility] Donita Brooks, MD   2 years ago Type 2 diabetes mellitus with complications (HCC)   [facility] Pickard, Priscille Heidelberg, MD       Future Appointments             In 3 months Pickard, Priscille Heidelberg, MD [facility]            Failed - Lipid Panel in normal range within the last 12 months    Cholesterol  Date Value Ref Range Status  09/27/2022 151 <200 mg/dL Final   LDL Cholesterol (Calc)  Date Value Ref Range Status  09/27/2022 97 mg/dL (calc) Final    Comment:    Reference range: <100 . Desirable range <100 mg/dL for primary prevention;   <70 mg/dL for patients with CHD or diabetic patients  with > or = 2 CHD risk factors. Marland Kitchen LDL-C is now calculated using the Martin-Hopkins  calculation, which is a validated novel method providing  better accuracy than the Friedewald equation in the  estimation of LDL-C.  Horald Pollen et al. Lenox Ahr. 9147;829(56): 2061-2068  (http://education.[employer].com/faq/FAQ164)    HDL  Date Value Ref Range Status  09/27/2022 29 (L) > OR = 50 mg/dL Final   Triglycerides  Date Value Ref Range Status  09/27/2022 153 (H) <150  mg/dL Final         Passed - Patient is not pregnant

## 2023-01-13 ENCOUNTER — Ambulatory Visit: Payer: Medicare HMO | Admitting: Podiatry

## 2023-01-13 DIAGNOSIS — L97522 Non-pressure chronic ulcer of other part of left foot with fat layer exposed: Secondary | ICD-10-CM

## 2023-01-13 NOTE — Progress Notes (Signed)
  Subjective:  Patient ID: Linda Briggs, female    DOB: 1956-07-12,  MRN: [id number]  Chief Complaint  Patient presents with   Diabetic Ulcer    4 week follow up left foot    67 y.o. female presents with the above complaint. History confirmed with patient.  She has been using the collagen as directed  Objective:  Physical Exam: warm, good capillary refill, normal DP and PT pulses, and diffuse neuropathy, full-thickness ulceration on medial left first MPJ measuring 1.0 x 0.3 x 0.3 cm, exposed subcutaneous tissue, surrounding macerated hyperkeratosis, serous drainage no purulent signs infection erythema or cellulitis.  No exposed bone tendon or joint.  Right foot ulcer has healed at nail site.     ABIs without significant atherosclerosis Assessment:   1. Ulcer of left foot with fat layer exposed (HCC)      Plan:  Patient was evaluated and treated and all questions answered.  Ulcer left foot -We discussed the etiology and factors that are a part of the wound healing process.  We also discussed the risk of infection both soft tissue and osteomyelitis from open ulceration.  Discussed the risk of limb loss if this happens or worsens. -Debridement as below. -Dressed with Prisma gauze and Coban, she should continue this at home to allow airflow and drainage.  Change daily.  Do think adhesive bandages are causing too much maceration   Procedure: Excisional Debridement of Wound Rationale: Removal of non-viable soft tissue from the wound to promote healing.  Anesthesia: none Post-Debridement Wound Measurements: Noted above Type of Debridement: Sharp Excisional Tissue Removed: Non-viable soft tissue Depth of Debridement: subcutaneous tissue. Technique: Sharp excisional debridement to bleeding, viable wound base.  Dressing: Dry, sterile, compression dressing. Disposition: Patient tolerated procedure well.       Return in about 3 weeks (around 02/03/2023) for wound care.

## 2023-01-20 NOTE — Progress Notes (Signed)
[facility] - Clinic Note  01/28/2023     CHIEF COMPLAINT Patient presents for Retina Follow Up   HISTORY OF PRESENT ILLNESS: Linda Briggs is a 67 y.o. female who presents to the clinic today for:   HPI     Retina Follow Up   Patient presents with  Dry AMD.  In both eyes.  This started 7 months ago.  Duration of 7 months.  Since onset it is stable.  I, the attending physician,  performed the HPI with the patient and updated documentation appropriately.        Comments   7 month AMD OU pt is reporting no vision changes noticed she denies any flashes of floaters her last A1C 6.8 148 this am       Last edited by Rennis Chris, MD on 01/30/2023  5:25 PM.    Pt saw Dr. Zenaida Niece in April, she was told to start wearing sunglasses outside bc Dr. Zenaida Niece saw sun damage in her eyes  Referring physician: Donita Brooks, MD [id number] [facility] [address] [place],  [place] [address]  HISTORICAL INFORMATION:   Selected notes from the MEDICAL RECORD NUMBER Referred by Dr. Zenaida Niece for retinal eval LEE:  Ocular Hx- PMH-    CURRENT MEDICATIONS: No current outpatient medications on file. (Ophthalmic Drugs)   No current facility-administered medications for this visit. (Ophthalmic Drugs)   Current Outpatient Medications (Other)  Medication Sig   ACCU-CHEK GUIDE test strip TEST BLOOD SUGAR THREE TIMES DAILY.   Accu-Chek Softclix Lancets lancets TEST BLOOD SUGAR THREE TIMES DAILY   Alcohol Swabs (DROPSAFE ALCOHOL PREP) 70 % PADS USE THREE TIMES DAILY   aspirin EC 81 MG tablet Take 81 mg by mouth daily.   Blood Glucose Calibration (TRUE METRIX LEVEL 2) Normal SOLN Use as directed to monitor FSBS 3x daily. Dx: E11.65   Blood Glucose Monitoring Suppl DEVI 1 each by Does not apply route in the morning, at noon, and at bedtime. May substitute to any manufacturer covered by patient's insurance.   cholecalciferol (VITAMIN D3) 25 MCG (1000 UNIT) tablet Take 1,000 Units by mouth daily.    cyanocobalamin (VITAMIN B12) 1000 MCG tablet Take 1,000 mcg by mouth daily.   Dulaglutide (TRULICITY) 3 MG/0.5ML SOPN Inject 3 mg as directed once a week.   FARXIGA 10 MG TABS tablet TAKE 1 TABLET EVERY DAY BEFORE BREAKFAST   furosemide (LASIX) 20 MG tablet TAKE 1 TABLET EVERY DAY   gabapentin (NEURONTIN) 300 MG capsule TAKE 1 CAPSULE THREE TIMES DAILY   lisinopril (ZESTRIL) 5 MG tablet TAKE 1 TABLET EVERY DAY   metFORMIN (GLUCOPHAGE) 1000 MG tablet TAKE 1 TABLET TWICE DAILY WITH MEALS   metoprolol tartrate (LOPRESSOR) 50 MG tablet TAKE 1/2 TABLET TWICE DAILY   Multiple Vitamin (MULTIVITAMIN) tablet Take 1 tablet by mouth daily.   ondansetron (ZOFRAN) 4 MG tablet TAKE 1 TABLET (4 MG TOTAL) BY MOUTH EVERY 8 (EIGHT) HOURS AS NEEDED FOR NAUSEA OR VOMITING.   pantoprazole (PROTONIX) 40 MG tablet TAKE 1 TABLET TWICE DAILY BEFORE MEALS   potassium chloride (KLOR-CON M) 10 MEQ tablet TAKE 1 TABLET EVERY DAY (NEED MD APPOINTMENT)   pravastatin (PRAVACHOL) 40 MG tablet TAKE 1 TABLET EVERY DAY   predniSONE (DELTASONE) 20 MG tablet 3 tabs poqday 1-2, 2 tabs poqday 3-4, 1 tab poqday 5-6   traMADol (ULTRAM) 50 MG tablet TAKE 1 TABLET BY MOUTH EVERY 6 HOURS AS NEEDED FOR  CHRONIC  BACK  PAIN  No current facility-administered medications for this visit. (Other)   REVIEW OF SYSTEMS: ROS   Positive for: Endocrine, Eyes Negative for: Constitutional, Gastrointestinal, Neurological, Skin, Genitourinary, Musculoskeletal, HENT, Cardiovascular, Respiratory, Psychiatric, Allergic/Imm, Heme/Lymph Last edited by Etheleen Mayhew, COT on 01/28/2023  2:20 PM.      ALLERGIES Allergies  Allergen Reactions   Codeine Anaphylaxis and Hives   Penicillins Anaphylaxis, Hives and Other (See Comments)    Has patient had a PCN reaction causing immediate rash, facial/tongue/throat swelling, SOB or lightheadedness with hypotension: No Has patient had a PCN reaction causing severe rash involving mucus membranes or skin  necrosis: No Has patient had a PCN reaction that required hospitalization: No Has patient had a PCN reaction occurring within the last 10 years: No If all of the above answers are "NO", then may proceed with Cephalosporin use.    Ciprofloxacin Other (See Comments)    Tongue Swelling   Food Nausea And Vomiting and Other (See Comments)    Olives    Januvia [Sitagliptin] Other (See Comments)    Stomach cramps   Latex Hives   PAST MEDICAL HISTORY 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    Peripheral vascular disease (HCC)    Shortness of breath dyspnea    on lopressor    Sleep apnea    pt had study done but it was incomplete due to blood sugar issues    Past Surgical History:  Procedure Laterality Date   BIOPSY  04/28/2018   Procedure: BIOPSY;  Surgeon: Meryl Dare, MD;  Location: [facility];  Service: Endoscopy;;   BIOPSY THYROID     january 2016   ESOPHAGOGASTRODUODENOSCOPY (EGD) WITH PROPOFOL N/A 04/28/2018   Procedure: ESOPHAGOGASTRODUODENOSCOPY (EGD) WITH PROPOFOL;  Surgeon: Meryl Dare, MD;  Location: [facility];  Service: Endoscopy;  Laterality: N/A;   HYSTEROSCOPY WITH D & C N/A 02/15/2015   Procedure: DILATATION AND CURETTAGE /HYSTEROSCOPY;  Surgeon: Mitchel Honour, DO;  Location: [facility];  Service: Gynecology;  Laterality: N/A;   TONSILLECTOMY     FAMILY HISTORY Family History  Problem Relation Age of Onset   Hyperlipidemia Mother    Heart disease Father    Hyperlipidemia Father    Hypertension Father    Kidney disease Sister    Kidney cancer Sister    Ovarian cancer Maternal Aunt    Kidney disease Maternal Aunt        x 2   Kidney disease Maternal Uncle        x 2    Heart disease Brother    Stroke Brother    Heart disease Brother    Colon cancer Neg Hx    Stomach cancer Neg Hx    Breast cancer Neg Hx    SOCIAL HISTORY Social History   Tobacco Use   Smoking status: Never   Smokeless tobacco: Never  Vaping Use   Vaping Use: Never used  Substance Use Topics   Alcohol use: No    Comment: social drinker.   Drug use: No       OPHTHALMIC EXAM:  Base Eye Exam  Visual Acuity (Snellen - Linear)       Right Left   Dist [place] 20/25 -3 20/20   Dist ph  NI          Tonometry (Tonopen, 2:21 PM)       Right Left   Pressure 14 16         Pupils       Pupils Dark Light Shape React APD   Right PERRL 2 1 Round Brisk None   Left PERRL 2 1 Round Brisk None         Visual Fields       Left Right    Full Full         Extraocular Movement       Right Left    Full, Ortho Full, Ortho         Neuro/Psych     Oriented x3: Yes   Mood/Affect: Normal         Dilation     Both eyes: 2.5% Phenylephrine @ 2:21 PM           Slit Lamp and Fundus Exam     Slit Lamp Exam       Right Left   Lids/Lashes Dermatochalasis - upper lid Dermatochalasis - upper lid   Conjunctiva/Sclera temporal pinguecula temporal pinguecula   Cornea 1+ inferior Punctate epithelial erosions, mild tear film debris 1+ inferior Punctate epithelial erosions, mild tear film debris   Anterior Chamber deep and clear deep and clear   Iris Round and dilated, No NVI Round and dilated, No NVI   Lens 2-3+ Nuclear sclerosis, 2-3+ Cortical cataract 2-3+ Nuclear sclerosis, 2-3+ Cortical cataract   Anterior Vitreous Vitreous syneresis Vitreous syneresis         Fundus Exam       Right Left   Disc Pink and Sharp Pink and Sharp, +cupping   C/D Ratio 0.6 0.5   Macula Blunted foveal reflex, Drusen, RPE mottling, rare MA, focal CWS inferior macula, no edema Blunted foveal reflex, Drusen, RPE mottling and clumping, no heme or edema   Vessels attenuated,  Tortuous attenuated, Tortuous   Periphery Attached, scattered MA/DBH, scattered peripheral drusen, mild patches of pigmented lattice from 1000 to 1100 superiorly - good laser surrounding, and 0430-0700 inferiorly - good laser surrounding, elevated choroidal lesion at 1230 equator -- collapses under scleral depression -- vortex vein varix - stable, pigmented paving stone degeneration inferiorly, no new RT/RD/lattice, focal blot heme at 1000 equator Attached, mild reticular degeneration, peripheral drusen, mild lattice at 0100 - good laser surrounding, pigmented paving stone degeneration inferiorly, scattered IRH / DBH temporal and inferior periphery, punctate VR tuft at 1100 - good laser surrounding; scattered peripheral cystoid degeneration, no new RT/RD/lattice           IMAGING AND PROCEDURES  Imaging and Procedures for 01/28/2023  OCT, Retina - OU - Both Eyes       Right Eye Quality was good. Central Foveal Thickness: 274. Progression has been stable. Findings include normal foveal contour, no IRF, no SRF, retinal drusen , vitreomacular adhesion (Hyporeflective elevated choroidal lesion superior periphery caught on widefield -- stable).   Left Eye Quality was good. Central Foveal Thickness: 275. Progression has been stable. Findings include normal foveal contour, no IRF, no SRF, retinal drusen , vitreomacular adhesion .   Notes *Images captured and stored on drive  Diagnosis / Impression:  NFP; no IRF/SRF OU +drusen OU OD: Hyporeflective elevated choroidal lesion superior periphery caught on widefield --  stable  Clinical management:  See below  Abbreviations: NFP - Normal foveal profile. CME - cystoid macular edema. PED - pigment epithelial detachment. IRF - intraretinal fluid. SRF - subretinal fluid. EZ - ellipsoid zone. ERM - epiretinal membrane. ORA - outer retinal atrophy. ORT - outer retinal tubulation. SRHM - subretinal hyper-reflective material. IRHM - intraretinal  hyper-reflective material             ASSESSMENT/PLAN:    ICD-10-CM   1. 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 Choroidal lesion  H31.8     3. Bilateral retinal lattice degeneration  H35.413     4. Bilateral retinal defect  H33.303     5. Essential hypertension  I10     6. Hypertensive retinopathy of both eyes  H35.033     7. Diabetes mellitus type 2 without retinopathy (HCC)  E11.9     8. Long term (current) use of oral hypoglycemic drugs  Z79.84     9. Long-term (current) use of injectable non-insulin antidiabetic drugs  Z79.85     10. Combined forms of age-related cataract of both eyes  H25.813      Age related macular degeneration, non-exudative, both eyes - early stage OU -- stable  - The incidence, anatomy, and pathology of dry AMD, risk of progression, and the AREDS and AREDS 2 study including smoking risks discussed with patient.  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 Lattice degeneration OU - OD: mild patches of pigmented lattice from 1000-1100 superiorly and 0430-0700 inferiorly - OS: mild lattice at 0100, VR tuft at 1030 - s/p laser retinopexy OD (06.20.23) -- good laser changes in place - s/p laser retinopexy OS (07.10.23) -- good laser changes in place - no new RT/RD or lattice OU - f/u in 6-9 months, sooner prn  5,6. Hypertensive retinopathy OU - discussed importance of tight BP control - monitor  7-9. Diabetes mellitus, type 2 without  retinopathy - The incidence, risk factors for progression, natural history and treatment options for diabetic retinopathy  were discussed with patient.   - The need for close monitoring of blood glucose, blood pressure, and serum lipids, avoiding cigarette or any type of tobacco, and the need for long term follow up was also discussed with patient. - f/u in 1 year, sooner prn  10. Mixed Cataract OU - The symptoms of cataract, surgical options, and treatments and risks were discussed with patient. - discussed diagnosis and progression - under the expert management of Dr. Zenaida Niece  Ophthalmic Meds Ordered this visit:  No orders of the defined types were placed in this encounter.    Return for f/u 6-9 months, non-exu ARMD OU, DFE, OCT.  There are no Patient Instructions on file for this visit.   Explained the diagnoses, plan, and follow up with the patient and they expressed understanding.  Patient expressed understanding of the importance of proper follow up care.   This document serves as a record of services personally performed by  Karie Chimera, MD, PhD. It was created on their behalf by Gerilyn Nestle, COT an ophthalmic technician. The creation of this record is the provider's dictation and/or activities during the visit.    Electronically signed by:  Gerilyn Nestle, COT  05.20.24 5:26 PM  This document serves as a record of services personally performed by Karie Chimera, MD, PhD. It was created on their behalf by Glee Arvin. Manson Passey, OA an ophthalmic technician. The creation of this record is the provider's dictation and/or activities during the visit.    Electronically signed by: Glee Arvin. Manson Passey, [place] 05.28.2024 5:26 PM  Karie Chimera, M.D., Ph.D. Diseases & Surgery of the Retina and Vitreous [facility]  I have reviewed the above documentation for accuracy and completeness, and I agree with the above. Karie Chimera, M.D., Ph.D. 01/30/23 5:27  PM  Abbreviations: M myopia (nearsighted); A astigmatism; H hyperopia (farsighted); P presbyopia; Mrx spectacle prescription;  CTL contact lenses; OD right eye; OS left eye; OU both eyes  XT exotropia; ET esotropia; PEK punctate epithelial keratitis; PEE punctate epithelial erosions; DES dry eye syndrome; MGD meibomian gland dysfunction; ATs artificial tears; PFAT's preservative free artificial tears; NSC nuclear sclerotic cataract; PSC posterior subcapsular cataract; ERM epi-retinal membrane; PVD posterior vitreous detachment; RD retinal detachment; DM diabetes mellitus; DR diabetic retinopathy; NPDR non-proliferative diabetic retinopathy; PDR proliferative diabetic retinopathy; CSME clinically significant macular edema; DME diabetic macular edema; dbh dot blot hemorrhages; CWS cotton wool spot; POAG primary open angle glaucoma; C/D cup-to-disc ratio; HVF humphrey visual field; GVF goldmann visual field; OCT optical coherence tomography; IOP intraocular pressure; BRVO Branch retinal vein occlusion; CRVO central retinal vein occlusion; CRAO central retinal artery occlusion; BRAO branch retinal artery occlusion; RT retinal tear; SB scleral buckle; PPV pars plana vitrectomy; VH Vitreous hemorrhage; PRP panretinal laser photocoagulation; IVK intravitreal kenalog; VMT vitreomacular traction; MH Macular hole;  NVD neovascularization of the disc; NVE neovascularization elsewhere; AREDS age related eye disease study; ARMD age related macular degeneration; POAG primary open angle glaucoma; EBMD epithelial/anterior basement membrane dystrophy; ACIOL anterior chamber intraocular lens; IOL intraocular lens; PCIOL posterior chamber intraocular lens; Phaco/IOL phacoemulsification with intraocular lens placement; PRK photorefractive keratectomy; LASIK laser assisted in situ keratomileusis; HTN hypertension; DM diabetes mellitus; COPD chronic obstructive pulmonary disease

## 2023-01-21 ENCOUNTER — Telehealth: Payer: Self-pay | Admitting: Family Medicine

## 2023-01-21 ENCOUNTER — Encounter: Payer: Self-pay | Admitting: Family Medicine

## 2023-01-21 NOTE — Telephone Encounter (Signed)
Left vm for patient asking her to return my call.  She is on our gap list of needing a Diabetic retinal exam-- see if we can schedule her for our upcoming event. She also needs lab work please schedule her for both.

## 2023-01-22 ENCOUNTER — Ambulatory Visit
Admission: RE | Admit: 2023-01-22 | Discharge: 2023-01-22 | Disposition: A | Discharge status: Home / Self Care | Payer: Medicare HMO | Source: Ambulatory Visit | Attending: Family Medicine | Admitting: Family Medicine

## 2023-01-22 DIAGNOSIS — Z1231 Encounter for screening mammogram for malignant neoplasm of breast: Secondary | ICD-10-CM | POA: Diagnosis not present

## 2023-01-28 ENCOUNTER — Ambulatory Visit (INDEPENDENT_AMBULATORY_CARE_PROVIDER_SITE_OTHER): Payer: Medicare HMO | Admitting: Ophthalmology

## 2023-01-28 ENCOUNTER — Encounter (INDEPENDENT_AMBULATORY_CARE_PROVIDER_SITE_OTHER): Payer: Self-pay | Admitting: Ophthalmology

## 2023-01-28 DIAGNOSIS — H35033 Hypertensive retinopathy, bilateral: Secondary | ICD-10-CM

## 2023-01-28 DIAGNOSIS — H33303 Unspecified retinal break, bilateral: Secondary | ICD-10-CM | POA: Diagnosis not present

## 2023-01-28 DIAGNOSIS — Z7984 Long term (current) use of oral hypoglycemic drugs: Secondary | ICD-10-CM

## 2023-01-28 DIAGNOSIS — H318 Other specified disorders of choroid: Secondary | ICD-10-CM

## 2023-01-28 DIAGNOSIS — H25813 Combined forms of age-related cataract, bilateral: Secondary | ICD-10-CM

## 2023-01-28 DIAGNOSIS — I10 Essential (primary) hypertension: Secondary | ICD-10-CM | POA: Diagnosis not present

## 2023-01-28 DIAGNOSIS — H353131 Nonexudative age-related macular degeneration, bilateral, early dry stage: Secondary | ICD-10-CM | POA: Diagnosis not present

## 2023-01-28 DIAGNOSIS — E119 Type 2 diabetes mellitus without complications: Secondary | ICD-10-CM

## 2023-01-28 DIAGNOSIS — H35413 Lattice degeneration of retina, bilateral: Secondary | ICD-10-CM

## 2023-01-28 DIAGNOSIS — Z7985 Long-term (current) use of injectable non-insulin antidiabetic drugs: Secondary | ICD-10-CM

## 2023-01-30 ENCOUNTER — Encounter (INDEPENDENT_AMBULATORY_CARE_PROVIDER_SITE_OTHER): Payer: Self-pay | Admitting: Ophthalmology

## 2023-02-05 ENCOUNTER — Ambulatory Visit: Payer: Medicare HMO | Admitting: Podiatry

## 2023-02-05 DIAGNOSIS — L97522 Non-pressure chronic ulcer of other part of left foot with fat layer exposed: Secondary | ICD-10-CM | POA: Diagnosis not present

## 2023-02-05 NOTE — Progress Notes (Signed)
  Subjective:  Patient ID: Linda Briggs, female    DOB: 28-Jul-1956,  MRN: [id number]  Chief Complaint  Patient presents with   Diabetic Ulcer    4 week follow up left foot    67 y.o. female presents with the above complaint. History confirmed with patient.  She has been using the collagen as directed  Objective:  Physical Exam: warm, good capillary refill, normal DP and PT pulses, and diffuse neuropathy, full-thickness ulceration on medial left first MPJ measuring 1.0 x 0.4 x 0.3 cm, exposed subcutaneous tissue, surrounding macerated hyperkeratosis, serous drainage no purulent signs infection erythema or cellulitis.  No exposed bone tendon or joint.       ABIs without significant atherosclerosis Assessment:   1. Ulcer of left foot with fat layer exposed (HCC)      Plan:  Patient was evaluated and treated and all questions answered.  Ulcer left foot -We discussed the etiology and factors that are a part of the wound healing process.  We also discussed the risk of infection both soft tissue and osteomyelitis from open ulceration.  Discussed the risk of limb loss if this happens or worsens. -Debridement as below. -Dressed with Prisma collagen and silver alginate to absorb drainage.  Use gauze and tape.  Change daily.   -Considering lack of healing may need to consider skin substitute application, her other risk factors are controlled, her A1c is controlled, does not smoke and her last ABIs were normal.  Procedure: Excisional Debridement of Wound Rationale: Removal of non-viable soft tissue from the wound to promote healing.  Anesthesia: none Post-Debridement Wound Measurements: Noted above Type of Debridement: Sharp Excisional Tissue Removed: Non-viable soft tissue Depth of Debridement: subcutaneous tissue. Technique: Sharp excisional debridement to bleeding, viable wound base.  Dressing: Dry, sterile, compression dressing. Disposition: Patient tolerated procedure well.        Return in about 3 weeks (around 02/03/2023) for wound care.

## 2023-02-25 ENCOUNTER — Ambulatory Visit: Payer: Medicare HMO | Admitting: Podiatry

## 2023-02-25 DIAGNOSIS — E0843 Diabetes mellitus due to underlying condition with diabetic autonomic (poly)neuropathy: Secondary | ICD-10-CM

## 2023-02-25 DIAGNOSIS — L97522 Non-pressure chronic ulcer of other part of left foot with fat layer exposed: Secondary | ICD-10-CM

## 2023-02-25 NOTE — Progress Notes (Signed)
Chief Complaint  Patient presents with   Foot Ulcer    Patient came in today for left foot ulcer, follow-up, "doing better" patient denies any pain    Subjective:  67 y.o. female with PMHx of diabetes mellitus presenting today for follow-up evaluation of an ulcer to the left foot being followed by Dr. Lilian Kapur here in our office.  She has been applying Prisma collagen and Aquacel Ag to the wound with a light dressing daily.  No new complaints   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    Peripheral vascular disease (HCC)    Shortness of breath dyspnea    on lopressor    Sleep apnea    pt had study done but it was incomplete due to blood sugar issues     Past Surgical History:  Procedure Laterality Date   BIOPSY  04/28/2018   Procedure: BIOPSY;  Surgeon: Meryl Dare, MD;  Location: [facility];  Service: Endoscopy;;   BIOPSY THYROID     january 2016   ESOPHAGOGASTRODUODENOSCOPY (EGD) WITH PROPOFOL N/A 04/28/2018   Procedure: ESOPHAGOGASTRODUODENOSCOPY (EGD) WITH PROPOFOL;  Surgeon: Meryl Dare, MD;  Location: [facility];  Service: Endoscopy;  Laterality: N/A;   HYSTEROSCOPY WITH D & C N/A 02/15/2015   Procedure: DILATATION AND CURETTAGE /HYSTEROSCOPY;  Surgeon: Mitchel Honour, DO;  Location: [facility];  Service: Gynecology;  Laterality: N/A;   TONSILLECTOMY      Allergies  Allergen Reactions   Codeine Anaphylaxis and Hives   Penicillins Anaphylaxis, Hives and Other (See Comments)    Has patient had a PCN reaction causing immediate rash, facial/tongue/throat swelling, SOB or lightheadedness with hypotension: No Has patient had a PCN reaction causing severe rash involving mucus membranes  or skin necrosis: No Has patient had a PCN reaction that required hospitalization: No Has patient had a PCN reaction occurring within the last 10 years: No If all of the above answers are "NO", then may proceed with Cephalosporin use.    Ciprofloxacin Other (See Comments)    Tongue Swelling   Food Nausea And Vomiting and Other (See Comments)    Olives    Januvia [Sitagliptin] Other (See Comments)    Stomach cramps   Latex Hives     Objective/Physical Exam General: The patient is alert and oriented x3 in no acute distress.  Dermatology:  Wound #1 noted to the medial aspect of the first MTP left foot measuring approximately 1.5 x 1.0 x 0.3 cm (LxWxD).   To the noted ulceration(s), there is no eschar. There is a moderate amount of slough, fibrin, and necrotic tissue noted. Granulation tissue and wound base is red. There is a minimal amount of serosanguineous drainage noted. There is no exposed bone muscle-tendon ligament or joint. There is no malodor. Periwound integrity is intact. Skin is warm, dry and supple bilateral lower extremities.  Vascular: Palpable pedal pulses bilaterally. No edema or erythema noted. Capillary refill within normal limits.  Clinically no concern for vascular compromise  Neurological: Light touch and protective threshold diminished bilaterally.   Musculoskeletal Exam: No prior amputations.  Hallux valgus noted  contributing to the pressure ulcer to the left foot  Assessment: 1.  Ulcer first MTP left foot secondary to diabetes mellitus 2. diabetes mellitus w/ peripheral neuropathy   Plan of Care:  -Patient was evaluated. -medically necessary excisional debridement including subcutaneous tissue was performed using a tissue nipper and a chisel blade. Excisional debridement of all the necrotic nonviable tissue down to healthy bleeding viable tissue was performed with post-debridement measurements same as pre-. -the wound was cleansed and dry sterile dressing  applied. -Continue the same regimen Prisma collagen with overlying Aquacel Ag and a light dressing daily -Return to clinic 3 weeks with Dr. Lilian Kapur for   Felecia Shelling, DPM [facility]  Dr. Felecia Shelling, DPM    [address] [place], [place] [address]                Office [phone]  Fax [phone]

## 2023-03-18 ENCOUNTER — Ambulatory Visit (INDEPENDENT_AMBULATORY_CARE_PROVIDER_SITE_OTHER): Payer: Medicare HMO | Admitting: Vascular Surgery

## 2023-03-18 ENCOUNTER — Encounter (INDEPENDENT_AMBULATORY_CARE_PROVIDER_SITE_OTHER): Payer: Self-pay | Admitting: Vascular Surgery

## 2023-03-18 VITALS — BP 128/72 | HR 66 | Resp 16 | Wt 273.4 lb

## 2023-03-18 DIAGNOSIS — I10 Essential (primary) hypertension: Secondary | ICD-10-CM | POA: Diagnosis not present

## 2023-03-18 DIAGNOSIS — E118 Type 2 diabetes mellitus with unspecified complications: Secondary | ICD-10-CM

## 2023-03-18 DIAGNOSIS — E782 Mixed hyperlipidemia: Secondary | ICD-10-CM

## 2023-03-18 DIAGNOSIS — I890 Lymphedema, not elsewhere classified: Secondary | ICD-10-CM | POA: Diagnosis not present

## 2023-03-18 DIAGNOSIS — L97521 Non-pressure chronic ulcer of other part of left foot limited to breakdown of skin: Secondary | ICD-10-CM

## 2023-03-18 NOTE — Assessment & Plan Note (Signed)
Perfusion checked earlier this year and by noninvasive study there appeared to be adequate perfusion for wound healing.  If her wound does not heal or problems develop, consideration for angiography can certainly be given and I am happy to do that at any point as an outpatient.

## 2023-03-18 NOTE — Progress Notes (Signed)
MRN : [id number]  Linda Briggs is a 67 y.o. (May 23, 1956) female who presents with chief complaint of  Chief Complaint  Patient presents with   Follow-up    1 yr follow up  .  History of Present Illness: Patient returns today in follow up of her lymphedema and leg swelling.  Her swelling is under good control.  She has marked stasis dermatitis and hyperpigmentation from her lymphedema.  She uses her lymphedema pump although not as regularly as she knows she should.  No weeping or signs of infection or cellulitis in the legs.  She has a chronic left foot ulceration that she has been following with podiatry with.  We performed an arterial evaluation earlier this year which was fairly normal and so no intervention was planned at that time.  Current Outpatient Medications  Medication Sig Dispense Refill   ACCU-CHEK GUIDE test strip TEST BLOOD SUGAR THREE TIMES DAILY. 300 strip 3   Accu-Chek Softclix Lancets lancets TEST BLOOD SUGAR THREE TIMES DAILY 300 each 3   Alcohol Swabs (DROPSAFE ALCOHOL PREP) 70 % PADS USE THREE TIMES DAILY 300 each 3   Blood Glucose Calibration (TRUE METRIX LEVEL 2) Normal SOLN Use as directed to monitor FSBS 3x daily. Dx: E11.65 1 each 1   Blood Glucose Monitoring Suppl DEVI 1 each by Does not apply route in the morning, at noon, and at bedtime. May substitute to any manufacturer covered by patient's insurance. 1 each 0   cholecalciferol (VITAMIN D3) 25 MCG (1000 UNIT) tablet Take 1,000 Units by mouth daily.     cyanocobalamin (VITAMIN B12) 1000 MCG tablet Take 1,000 mcg by mouth daily.     Dulaglutide (TRULICITY) 3 MG/0.5ML SOPN Inject 3 mg as directed once a week. 6 mL 3   FARXIGA 10 MG TABS tablet TAKE 1 TABLET EVERY DAY BEFORE BREAKFAST 90 tablet 3   furosemide (LASIX) 20 MG tablet TAKE 1 TABLET EVERY DAY 90 tablet 3   gabapentin (NEURONTIN) 300 MG capsule TAKE 1 CAPSULE THREE TIMES DAILY 270 capsule 3   lisinopril (ZESTRIL) 5 MG tablet TAKE 1 TABLET EVERY DAY  90 tablet 1   metFORMIN (GLUCOPHAGE) 1000 MG tablet TAKE 1 TABLET TWICE DAILY WITH MEALS 180 tablet 3   metoprolol tartrate (LOPRESSOR) 50 MG tablet TAKE 1/2 TABLET TWICE DAILY 90 tablet 3   Multiple Vitamin (MULTIVITAMIN) tablet Take 1 tablet by mouth daily.     ondansetron (ZOFRAN) 4 MG tablet TAKE 1 TABLET (4 MG TOTAL) BY MOUTH EVERY 8 (EIGHT) HOURS AS NEEDED FOR NAUSEA OR VOMITING. 60 tablet 3   pantoprazole (PROTONIX) 40 MG tablet TAKE 1 TABLET TWICE DAILY BEFORE MEALS 180 tablet 3   potassium chloride (KLOR-CON M) 10 MEQ tablet TAKE 1 TABLET EVERY DAY (NEED MD APPOINTMENT) 90 tablet 3   pravastatin (PRAVACHOL) 40 MG tablet TAKE 1 TABLET EVERY DAY 90 tablet 0   traMADol (ULTRAM) 50 MG tablet TAKE 1 TABLET BY MOUTH EVERY 6 HOURS AS NEEDED FOR  CHRONIC  BACK  PAIN 60 tablet 0   aspirin EC 81 MG tablet Take 81 mg by mouth daily. (Patient not taking: Reported on 03/18/2023)     predniSONE (DELTASONE) 20 MG tablet 3 tabs poqday 1-2, 2 tabs poqday 3-4, 1 tab poqday 5-6 (Patient not taking: Reported on 03/18/2023) 12 tablet 0   No current facility-administered medications for this visit.    Past Medical History:  Diagnosis Date   Arthritis    feet & toes  Bulging disc    Diabetes mellitus    Gastroparesis    Hyperlipidemia    Hypertension    Neuromuscular disorder (HCC)    in feet and fingers    Neuropathy    feet   Nonproliferative diabetic retinopathy (HCC)    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    Peripheral vascular disease (HCC)    Shortness of breath dyspnea    on lopressor    Sleep apnea    pt had study done but it was incomplete due to blood sugar issues     Past Surgical History:  Procedure Laterality Date   BIOPSY  04/28/2018   Procedure: BIOPSY;  Surgeon: Meryl Dare, MD;  Location: [facility];  Service: Endoscopy;;   BIOPSY THYROID     january 2016   ESOPHAGOGASTRODUODENOSCOPY (EGD) WITH PROPOFOL N/A 04/28/2018   Procedure: ESOPHAGOGASTRODUODENOSCOPY  (EGD) WITH PROPOFOL;  Surgeon: Meryl Dare, MD;  Location: [facility];  Service: Endoscopy;  Laterality: N/A;   HYSTEROSCOPY WITH D & C N/A 02/15/2015   Procedure: DILATATION AND CURETTAGE /HYSTEROSCOPY;  Surgeon: Mitchel Honour, DO;  Location: [facility];  Service: Gynecology;  Laterality: N/A;   TONSILLECTOMY       Social History   Tobacco Use   Smoking status: Never   Smokeless tobacco: Never  Vaping Use   Vaping status: Never Used  Substance Use Topics   Alcohol use: No    Comment: social drinker.   Drug use: No      Family History  Problem Relation Age of Onset   Hyperlipidemia Mother    Heart disease Father    Hyperlipidemia Father    Hypertension Father    Kidney disease Sister    Kidney cancer Sister    Ovarian cancer Maternal Aunt    Kidney disease Maternal Aunt        x 2   Kidney disease Maternal Uncle        x 2   Heart disease Brother    Stroke Brother    Heart disease Brother    Colon cancer Neg Hx    Stomach cancer Neg Hx    Breast cancer Neg Hx      Allergies  Allergen Reactions   Codeine Anaphylaxis and Hives   Penicillins Anaphylaxis, Hives and Other (See Comments)    Has patient had a PCN reaction causing immediate rash, facial/tongue/throat swelling, SOB or lightheadedness with hypotension: No Has patient had a PCN reaction causing severe rash involving mucus membranes or skin necrosis: No Has patient had a PCN reaction that required hospitalization: No Has patient had a PCN reaction occurring within the last 10 years: No If all of the above answers are "NO", then may proceed with Cephalosporin use.    Ciprofloxacin Other (See Comments)    Tongue Swelling   Food Nausea And Vomiting and Other (See Comments)    Olives    Januvia [Sitagliptin] Other (See Comments)    Stomach cramps   Latex Hives     REVIEW OF SYSTEMS (Negative unless checked)   Constitutional: [] Weight loss  [] Fever  [] Chills Cardiac: [] Chest pain   [] Chest pressure    [] Palpitations   [] Shortness of breath when laying flat   [x] Shortness of breath at rest   [x] Shortness of breath with exertion. Vascular:  [] Pain in legs with walking   [] Pain in legs at rest   [] Pain in legs when laying flat   [] Claudication   [] Pain in feet when  walking  [] Pain in feet at rest  [] Pain in feet when laying flat   [] History of DVT   [] Phlebitis   [x] Swelling in legs   [] Varicose veins   [] Non-healing ulcers Pulmonary:   [] Uses home oxygen   [] Productive cough   [] Hemoptysis   [] Wheeze  [] COPD   [] Asthma Neurologic:  [] Dizziness  [] Blackouts   [] Seizures   [] History of stroke   [] History of TIA  [] Aphasia   [] Temporary blindness   [] Dysphagia   [] Weakness or numbness in arms   [] Weakness or numbness in legs Musculoskeletal:  [x] Arthritis   [] Joint swelling   [] Joint pain   [x] Low back pain Hematologic:  [] Easy bruising  [] Easy bleeding   [] Hypercoagulable state   [] Anemic  [] Hepatitis Gastrointestinal:  [] Blood in stool   [] Vomiting blood  [] Gastroesophageal reflux/heartburn   [] Abdominal pain Genitourinary:  [] Chronic kidney disease   [] Difficult urination  [] Frequent urination  [] Burning with urination   [] Hematuria Skin:  [] Rashes   [] Ulcers   [] Wounds Psychological:  [] History of anxiety   []  History of major depression.  Physical Examination  BP 128/72 (BP Location: Left Arm)   Pulse 66   Resp 16   Wt 273 lb 6.4 oz (124 kg)   BMI 44.13 kg/m  Gen:  WD/WN, NAD. Appears older than stated age. Head: [place]/AT, No temporalis wasting. Ear/Nose/Throat: Hearing grossly intact, nares w/o erythema or drainage Eyes: Conjunctiva clear. Sclera non-icteric Neck: Supple.  Trachea midline Pulmonary:  Good air movement, no use of accessory muscles.  Cardiac: RRR, no JVD Vascular:  Vessel Right Left  Radial Palpable Palpable                          PT Not Palpable Not Palpable  DP Not Palpable Not Palpable   Gastrointestinal: soft, non-tender/non-distended. No guarding/reflex.   Musculoskeletal: M/S 5/5 throughout.  No deformity or atrophy.  Marked stasis dermatitis changes and hyperpigmentation bilaterally.  1-2+ bilateral lower extremity edema. Neurologic: Sensation grossly intact in extremities.  Symmetrical.  Speech is fluent.  Psychiatric: Judgment intact, Mood & affect appropriate for pt's clinical situation. Dermatologic: Left foot ulceration is currently dressed      Labs Recent Results (from the past 2160 hour(s))  HM DIABETES EYE EXAM     Status: Abnormal   Collection Time: 12/19/22 12:00 AM  Result Value Ref Range   HM Diabetic Eye Exam Retinopathy (A) No Retinopathy    Radiology No results found.  Assessment/Plan Hyperlipidemia lipid control important in reducing the progression of atherosclerotic disease. Continue statin therapy     Type 2 diabetes mellitus with complications blood glucose control important in reducing the progression of atherosclerotic disease. Also, involved in wound healing. On appropriate medications.     Essential hypertension blood pressure control important in reducing the progression of atherosclerotic disease. On appropriate oral medications.  Ulcerated, foot, left, limited to breakdown of skin (HCC) Perfusion checked earlier this year and by noninvasive study there appeared to be adequate perfusion for wound healing.  If her wound does not heal or problems develop, consideration for angiography can certainly be given and I am happy to do that at any point as an outpatient.  Lymphedema She has stable, chronic lymphedema which is stage II.  She should use her pump daily, continue compression socks, and elevate her legs.  Follow-up in 1 year.    Festus Barren, MD  03/18/2023 3:17 PM    This note was created with [employer]  medical transcription system.  Any errors from dictation are purely unintentional

## 2023-03-18 NOTE — Assessment & Plan Note (Signed)
She has stable, chronic lymphedema which is stage II.  She should use her pump daily, continue compression socks, and elevate her legs.  Follow-up in 1 year.

## 2023-03-23 ENCOUNTER — Other Ambulatory Visit: Payer: Self-pay | Admitting: Family Medicine

## 2023-03-24 NOTE — Telephone Encounter (Signed)
Future OV scheduled.  Requested Prescriptions  Pending Prescriptions Disp Refills   pravastatin (PRAVACHOL) 40 MG tablet [Pharmacy Med Name: PRAVASTATIN SODIUM 40 MG Tablet] 90 tablet 0    Sig: TAKE 1 TABLET EVERY DAY     Cardiovascular:  Antilipid - Statins Failed - 03/23/2023  3:10 AM      Failed - Valid encounter within last 12 months    Recent Outpatient Visits           1 year ago Type 2 diabetes mellitus with complications (HCC)   [facility] Pickard, Priscille Heidelberg, MD   1 year ago Fever, unspecified fever cause   [facility] Valentino Nose, NP   1 year ago Type 2 diabetes mellitus with complications (HCC)   [facility] Donita Brooks, MD   2 years ago Type 2 diabetes mellitus with complications (HCC)   [facility] Donita Brooks, MD   3 years ago Type 2 diabetes mellitus with complications (HCC)   [facility] Pickard, Priscille Heidelberg, MD       Future Appointments             In 1 month Pickard, Priscille Heidelberg, MD [facility]            Failed - Lipid Panel in normal range within the last 12 months    Cholesterol  Date Value Ref Range Status  09/27/2022 151 <200 mg/dL Final   LDL Cholesterol (Calc)  Date Value Ref Range Status  09/27/2022 97 mg/dL (calc) Final    Comment:    Reference range: <100 . Desirable range <100 mg/dL for primary prevention;   <70 mg/dL for patients with CHD or diabetic patients  with > or = 2 CHD risk factors. Marland Kitchen LDL-C is now calculated using the Martin-Hopkins  calculation, which is a validated novel method providing  better accuracy than the Friedewald equation in the  estimation of LDL-C.  Horald Pollen et al. Lenox Ahr. 1610;960(45): 2061-2068  (http://education.[facility].com/faq/FAQ164)    HDL  Date Value Ref Range Status  09/27/2022 29 (L) > OR = 50 mg/dL Final   Triglycerides  Date Value Ref Range Status   09/27/2022 153 (H) <150 mg/dL Final         Passed - Patient is not pregnant

## 2023-03-26 ENCOUNTER — Encounter: Payer: Self-pay | Admitting: Podiatry

## 2023-03-26 ENCOUNTER — Ambulatory Visit: Payer: Medicare HMO | Admitting: Podiatry

## 2023-03-26 DIAGNOSIS — L97522 Non-pressure chronic ulcer of other part of left foot with fat layer exposed: Secondary | ICD-10-CM

## 2023-03-26 NOTE — Progress Notes (Signed)
  Subjective:  Patient ID: Linda Briggs, female    DOB: December 10, 1955,  MRN: [id number]  Chief Complaint  Patient presents with   Wound Check    "I think it looks smaller.  It doesn't look as deep."    67 y.o. female presents with the above complaint. History confirmed with patient.  She has been using the collagen as directed  Objective:  Physical Exam: warm, good capillary refill, normal DP and PT pulses, and diffuse neuropathy, full-thickness ulceration on medial left first MPJ measuring 0.7 x 0.4 x 0.3 cm, exposed subcutaneous tissue, surrounding macerated hyperkeratosis, serous drainage no purulent signs infection erythema or cellulitis.  No exposed bone tendon or joint.       ABIs without significant atherosclerosis Assessment:   1. Ulcer of left foot with fat layer exposed (HCC)      Plan:  Patient was evaluated and treated and all questions answered.  Ulcer left foot -We discussed the etiology and factors that are a part of the wound healing process.  We also discussed the risk of infection both soft tissue and osteomyelitis from open ulceration.  Discussed the risk of limb loss if this happens or worsens. -Debridement as below. -Dressed with Prisma collagen and silver alginate to absorb drainage.  Use gauze and tape.  Change daily.      Procedure: Excisional Debridement of Wound Rationale: Removal of non-viable soft tissue from the wound to promote healing.  Anesthesia: none Post-Debridement Wound Measurements: Noted above Type of Debridement: Sharp Excisional Tissue Removed: Non-viable soft tissue Depth of Debridement: subcutaneous tissue. Technique: Sharp excisional debridement to bleeding, viable wound base.  Dressing: Dry, sterile, compression dressing. Disposition: Patient tolerated procedure well.       Return in about 3 weeks (around 04/16/2023) for wound care.

## 2023-04-16 ENCOUNTER — Ambulatory Visit: Payer: Medicare HMO | Admitting: Podiatry

## 2023-04-16 ENCOUNTER — Encounter: Payer: Self-pay | Admitting: Podiatry

## 2023-04-16 DIAGNOSIS — L97522 Non-pressure chronic ulcer of other part of left foot with fat layer exposed: Secondary | ICD-10-CM

## 2023-04-16 NOTE — Patient Instructions (Signed)
Call [facility] to schedule your MRI at the below locations.  Please allow at least 1 business day after your visit to process the referral.  It may take longer depending on approval from insurance.  Please let me know if you have issues or problems scheduling the MRI   [facility] [phone] [address] [place], [place] [address]  [facility] [phone] [address] [place], [place] [address]  

## 2023-04-16 NOTE — Progress Notes (Signed)
  Subjective:  Patient ID: Linda Briggs, female    DOB: [birth year],  MRN: [id number]  Chief Complaint  Patient presents with   Wound Check    "I think it's about the same.  The one on my heel the skin that was on top of the blister fell off after I got out of the shower this morning.  I put some of the ointment on it this morning.  I usually clean it with alcohol then dress it."    67 y.o. female presents with the above complaint. History confirmed with patient.  She has been using the collagen as directed, the blood blister for the last visit has developed into a full ulceration now.  Objective:  Physical Exam: warm, good capillary refill, normal DP and PT pulses, and diffuse neuropathy, full-thickness ulceration on medial left first MPJ measuring 0.7 x 0.3 x 0.3 cm,, new ulcer plantar right heel 1.0 x 0.6 x 0.3 cm exposed subcutaneous tissue, surrounding macerated hyperkeratosis, serous drainage no purulent signs infection erythema or cellulitis.  No exposed bone tendon or joint.          ABIs without significant atherosclerosis Assessment:   1. Ulcer of left foot with fat layer exposed (HCC)      Plan:  Patient was evaluated and treated and all questions answered.  Ulcer left foot -We discussed the etiology and factors that are a part of the wound healing process.  We also discussed the risk of infection both soft tissue and osteomyelitis from open ulceration.  Discussed the risk of limb loss if this happens or worsens. -Debridement as below. -Dressed with Prisma collagen and silver alginate to absorb drainage.  Use gauze and tape.  Change daily.   -Ulcerations continue to persist.  We could consider excision of the left foot ulcer with or without or rotational flap to close the tissue.  I recommended MRI to evaluate for any deeper infection prior to this.  MRI ordered I will see her back after the study or in 4 weeks for further wound care   Procedure: Excisional  Debridement of Wound Rationale: Removal of non-viable soft tissue from the wound to promote healing.  Anesthesia: none Post-Debridement Wound Measurements: Noted above Type of Debridement: Sharp Excisional Tissue Removed: Non-viable soft tissue Depth of Debridement: subcutaneous tissue. Technique: Sharp excisional debridement to bleeding, viable wound base.  Dressing: Dry, sterile, compression dressing. Disposition: Patient tolerated procedure well.       Return in about 4 weeks (around 05/14/2023).

## 2023-04-23 ENCOUNTER — Other Ambulatory Visit: Payer: Medicare HMO

## 2023-04-23 DIAGNOSIS — E7800 Pure hypercholesterolemia, unspecified: Secondary | ICD-10-CM | POA: Diagnosis not present

## 2023-04-23 DIAGNOSIS — K7581 Nonalcoholic steatohepatitis (NASH): Secondary | ICD-10-CM

## 2023-04-23 DIAGNOSIS — I10 Essential (primary) hypertension: Secondary | ICD-10-CM | POA: Diagnosis not present

## 2023-04-23 DIAGNOSIS — R300 Dysuria: Secondary | ICD-10-CM | POA: Diagnosis not present

## 2023-04-23 DIAGNOSIS — E118 Type 2 diabetes mellitus with unspecified complications: Secondary | ICD-10-CM | POA: Diagnosis not present

## 2023-04-23 LAB — URINALYSIS, ROUTINE W REFLEX MICROSCOPIC
Bilirubin Urine: NEGATIVE
Hgb urine dipstick: NEGATIVE
Ketones, ur: NEGATIVE
Leukocytes,Ua: NEGATIVE
Nitrite: NEGATIVE
Protein, ur: NEGATIVE
Specific Gravity, Urine: 1.015 (ref 1.001–1.035)
pH: 6 (ref 5.0–8.0)

## 2023-04-24 LAB — MICROALBUMIN / CREATININE URINE RATIO
Creatinine, Urine: 99 mg/dL (ref 20–275)
Microalb Creat Ratio: 3 mg/g{creat} (ref <30)
Microalb, Ur: 0.3 mg/dL

## 2023-04-24 LAB — LIPID PANEL
Cholesterol: 181 mg/dL (ref <200)
HDL: 30 mg/dL — ABNORMAL LOW (ref >=50)
LDL Cholesterol (Calc): 114 mg/dL — ABNORMAL HIGH
Non-HDL Cholesterol (Calc): 151 mg/dL — ABNORMAL HIGH (ref <130)
Total CHOL/HDL Ratio: 6 (calc) — ABNORMAL HIGH (ref <5.0)
Triglycerides: 258 mg/dL — ABNORMAL HIGH (ref <150)

## 2023-04-24 LAB — COMPLETE METABOLIC PANEL WITH GFR
AG Ratio: 1.8 (calc) (ref 1.0–2.5)
ALT: 14 U/L (ref 6–29)
AST: 16 U/L (ref 10–35)
Albumin: 4.4 g/dL (ref 3.6–5.1)
Alkaline phosphatase (APISO): 68 U/L (ref 37–153)
BUN: 20 mg/dL (ref 7–25)
CO2: 28 mmol/L (ref 20–32)
Calcium: 9.6 mg/dL (ref 8.6–10.4)
Chloride: 100 mmol/L (ref 98–110)
Creat: 1.04 mg/dL (ref 0.50–1.05)
Globulin: 2.5 g/dL (ref 1.9–3.7)
Glucose, Bld: 146 mg/dL — ABNORMAL HIGH (ref 65–99)
Potassium: 4.3 mmol/L (ref 3.5–5.3)
Sodium: 140 mmol/L (ref 135–146)
Total Bilirubin: 0.6 mg/dL (ref 0.2–1.2)
Total Protein: 6.9 g/dL (ref 6.1–8.1)
eGFR: 60 mL/min/{1.73_m2} (ref >=60)

## 2023-04-24 LAB — CBC WITH DIFFERENTIAL/PLATELET
Absolute Monocytes: 480 {cells}/uL (ref 200–950)
Basophils Absolute: 87 {cells}/uL (ref 0–200)
Basophils Relative: 0.8 %
Eosinophils Absolute: 142 {cells}/uL (ref 15–500)
Eosinophils Relative: 1.3 %
HCT: 42.3 % (ref 35.0–45.0)
Hemoglobin: 14.2 g/dL (ref 11.7–15.5)
Lymphs Abs: 2889 {cells}/uL (ref 850–3900)
MCH: 31.1 pg (ref 27.0–33.0)
MCHC: 33.6 g/dL (ref 32.0–36.0)
MCV: 92.8 fL (ref 80.0–100.0)
MPV: 10.2 fL (ref 7.5–12.5)
Monocytes Relative: 4.4 %
Neutro Abs: 7303 {cells}/uL (ref 1500–7800)
Neutrophils Relative %: 67 %
Platelets: 111 10*3/uL — ABNORMAL LOW (ref 140–400)
RBC: 4.56 10*6/uL (ref 3.80–5.10)
RDW: 13.4 % (ref 11.0–15.0)
Total Lymphocyte: 26.5 %
WBC: 10.9 10*3/uL — ABNORMAL HIGH (ref 3.8–10.8)

## 2023-04-24 LAB — HEMOGLOBIN A1C
Hgb A1c MFr Bld: 6.6 %{Hb} — ABNORMAL HIGH (ref <5.7)
Mean Plasma Glucose: 143 mg/dL
eAG (mmol/L): 7.9 mmol/L

## 2023-04-28 ENCOUNTER — Encounter: Payer: Self-pay | Admitting: Podiatry

## 2023-04-28 ENCOUNTER — Ambulatory Visit (INDEPENDENT_AMBULATORY_CARE_PROVIDER_SITE_OTHER): Payer: Medicare HMO | Admitting: Family Medicine

## 2023-04-28 VITALS — BP 130/80 | HR 66 | Temp 97.9°F | Ht 66.0 in | Wt 273.0 lb

## 2023-04-28 DIAGNOSIS — I890 Lymphedema, not elsewhere classified: Secondary | ICD-10-CM

## 2023-04-28 DIAGNOSIS — I10 Essential (primary) hypertension: Secondary | ICD-10-CM | POA: Diagnosis not present

## 2023-04-28 DIAGNOSIS — K7581 Nonalcoholic steatohepatitis (NASH): Secondary | ICD-10-CM | POA: Diagnosis not present

## 2023-04-28 DIAGNOSIS — Z7985 Long-term (current) use of injectable non-insulin antidiabetic drugs: Secondary | ICD-10-CM | POA: Diagnosis not present

## 2023-04-28 DIAGNOSIS — E118 Type 2 diabetes mellitus with unspecified complications: Secondary | ICD-10-CM | POA: Diagnosis not present

## 2023-04-28 NOTE — Progress Notes (Signed)
Subjective:    Patient ID: Linda Briggs, female    DOB: 05/10/1956, 67 y.o.   MRN: [id number]  Patient is here today for follow up of her DM2.  Blood sugars are 110-180.  No hypoglycemia.  No chest pain, sob, doe.  Has non healing ulcer on plantar surface of left 1st MTP joint.  MRI scheduled Friday by podiatry to look for osteomyelitis.  Denies fever or chills.  BP is low at home.  Lowest was 80's/50's.  Reports orthostatic dizziness and lightheadedness at times.  Also recently experienced angioedema she suspects to cayenne pepper.   She has no sensation to 10 g monofilament with palpable dorsalis pedis and posterior tibialis pulses Lab on 04/23/2023  Component Date Value Ref Range Status   WBC 04/23/2023 10.9 (H)  3.8 - 10.8 Thousand/uL Final   RBC 04/23/2023 4.56  3.80 - 5.10 Million/uL Final   Hemoglobin 04/23/2023 14.2  11.7 - 15.5 g/dL Final   HCT 40/98/1191 42.3  35.0 - 45.0 % Final   MCV 04/23/2023 92.8  80.0 - 100.0 fL Final   MCH 04/23/2023 31.1  27.0 - 33.0 pg Final   MCHC 04/23/2023 33.6  32.0 - 36.0 g/dL Final   RDW 47/82/9562 13.4  11.0 - 15.0 % Final   Platelets 04/23/2023 111 (L)  140 - 400 Thousand/uL Final   MPV 04/23/2023 10.2  7.5 - 12.5 fL Final   Neutro Abs 04/23/2023 7,303  1,500 - 7,800 cells/uL Final   Lymphs Abs 04/23/2023 2,889  850 - 3,900 cells/uL Final   Absolute Monocytes 04/23/2023 480  200 - 950 cells/uL Final   Eosinophils Absolute 04/23/2023 142  15 - 500 cells/uL Final   Basophils Absolute 04/23/2023 87  0 - 200 cells/uL Final   Neutrophils Relative % 04/23/2023 67  % Final   Total Lymphocyte 04/23/2023 26.5  % Final   Monocytes Relative 04/23/2023 4.4  % Final   Eosinophils Relative 04/23/2023 1.3  % Final   Basophils Relative 04/23/2023 0.8  % Final   Glucose, Bld 04/23/2023 146 (H)  65 - 99 mg/dL Final   Comment: .            Fasting reference interval . For someone without known diabetes, a glucose value >125 mg/dL indicates that they may  have diabetes and this should be confirmed with a follow-up test. .    BUN 04/23/2023 20  7 - 25 mg/dL Final   Creat 13/04/6577 1.04  0.50 - 1.05 mg/dL Final   eGFR 46/96/2952 60  > OR = 60 mL/min/1.81m2 Final   BUN/Creatinine Ratio 04/23/2023 SEE NOTE:  6 - 22 (calc) Final   Comment:    Not Reported: BUN and Creatinine are within    reference range. .    Sodium 04/23/2023 140  135 - 146 mmol/L Final   Potassium 04/23/2023 4.3  3.5 - 5.3 mmol/L Final   Chloride 04/23/2023 100  98 - 110 mmol/L Final   CO2 04/23/2023 28  20 - 32 mmol/L Final   Calcium 04/23/2023 9.6  8.6 - 10.4 mg/dL Final   Total Protein 84/13/2440 6.9  6.1 - 8.1 g/dL Final   Albumin 06/29/2535 4.4  3.6 - 5.1 g/dL Final   Globulin 64/40/3474 2.5  1.9 - 3.7 g/dL (calc) Final   AG Ratio 04/23/2023 1.8  1.0 - 2.5 (calc) Final   Total Bilirubin 04/23/2023 0.6  0.2 - 1.2 mg/dL Final   Alkaline phosphatase (APISO) 04/23/2023 68  37 -  153 U/L Final   AST 04/23/2023 16  10 - 35 U/L Final   ALT 04/23/2023 14  6 - 29 U/L Final   Hgb A1c MFr Bld 04/23/2023 6.6 (H)  <5.7 % of total Hgb Final   Comment: For someone without known diabetes, a hemoglobin A1c value of 6.5% or greater indicates that they may have  diabetes and this should be confirmed with a follow-up  test. . For someone with known diabetes, a value <7% indicates  that their diabetes is well controlled and a value  greater than or equal to 7% indicates suboptimal  control. A1c targets should be individualized based on  duration of diabetes, age, comorbid conditions, and  other considerations. . Currently, no consensus exists regarding use of hemoglobin A1c for diagnosis of diabetes for children. .    Mean Plasma Glucose 04/23/2023 143  mg/dL Final   eAG (mmol/L) 84/13/2440 7.9  mmol/L Final   Comment: . This test was performed on the Roche cobas c503 platform. Effective 06/10/22, a change in test platforms from the Abbott Architect to the Roche cobas  c503 may have shifted HbA1c results compared to historical results. Based on laboratory validation testing conducted at [facility], the Roche platform relative to the Abbott platform had an average increase in HbA1c value of < or = 0.3%. This difference is within accepted  variability established by the [facility]. Note that not all individuals will have had a shift in their results and direct comparisons between historical and current results for testing conducted on different platforms is not recommended.    Cholesterol 04/23/2023 181  <200 mg/dL Final   HDL 06/29/2535 30 (L)  > OR = 50 mg/dL Final   Triglycerides 64/40/3474 258 (H)  <150 mg/dL Final   Comment: . If a non-fasting specimen was collected, consider repeat triglyceride testing on a fasting specimen if clinically indicated.  Perry Mount et al. J. of Clin. Lipidol. 2015;9:129-169. Marland Kitchen    LDL Cholesterol (Calc) 04/23/2023 114 (H)  mg/dL (calc) Final   Comment: Reference range: <100 . Desirable range <100 mg/dL for primary prevention;   <70 mg/dL for patients with CHD or diabetic patients  with > or = 2 CHD risk factors. Marland Kitchen LDL-C is now calculated using the Martin-Hopkins  calculation, which is a validated novel method providing  better accuracy than the Friedewald equation in the  estimation of LDL-C.  Horald Pollen et al. Lenox Ahr. 2595;638(75): 2061-2068  (http://education.[facility].com/faq/FAQ164)    Total CHOL/HDL Ratio 04/23/2023 6.0 (H)  <5.0 (calc) Final   Non-HDL Cholesterol (Calc) 04/23/2023 151 (H)  <130 mg/dL (calc) Final   Comment: For patients with diabetes plus 1 major ASCVD risk  factor, treating to a non-HDL-C goal of <100 mg/dL  (LDL-C of <64 mg/dL) is considered a therapeutic  option.    Creatinine, Urine 04/23/2023 99  20 - 275 mg/dL Final   Microalb, Ur 33/29/5188 0.3  mg/dL Final   Comment: Reference Range Not established    Microalb Creat Ratio 04/23/2023 3   <30 mg/g creat Final   Comment: . The ADA defines abnormalities in albumin excretion as follows: Marland Kitchen Albuminuria Category        Result (mg/g creatinine) . Normal to Mildly increased   <30 Moderately increased         30-299  Severely increased           > OR = 300 . The ADA recommends that at least two of three specimens collected within  a 3-6 month period be abnormal before considering a patient to be within a diagnostic category.    Color, Urine 04/23/2023 YELLOW  YELLOW Final   APPearance 04/23/2023 CLEAR  CLEAR Final   Specific Gravity, Urine 04/23/2023 1.015  1.001 - 1.035 Final   pH 04/23/2023 6.0  5.0 - 8.0 Final   Glucose, UA 04/23/2023 3+ (A)  NEGATIVE Final   Bilirubin Urine 04/23/2023 NEGATIVE  NEGATIVE Final   Ketones, ur 04/23/2023 NEGATIVE  NEGATIVE Final   Hgb urine dipstick 04/23/2023 NEGATIVE  NEGATIVE Final   Protein, ur 04/23/2023 NEGATIVE  NEGATIVE Final   Nitrite 04/23/2023 NEGATIVE  NEGATIVE Final   Leukocytes,Ua 04/23/2023 NEGATIVE  NEGATIVE Final    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    Peripheral vascular disease (HCC)    Shortness of breath dyspnea    on lopressor    Sleep apnea    pt had study done but it was incomplete due to blood sugar issues    Past Surgical History:  Procedure Laterality Date   BIOPSY  04/28/2018   Procedure: BIOPSY;  Surgeon: Meryl Dare, MD;  Location: [facility];  Service: Endoscopy;;   BIOPSY THYROID     january 2016   ESOPHAGOGASTRODUODENOSCOPY (EGD) WITH PROPOFOL N/A 04/28/2018   Procedure: ESOPHAGOGASTRODUODENOSCOPY (EGD) WITH PROPOFOL;  Surgeon: Meryl Dare, MD;  Location: [facility];  Service: Endoscopy;   Laterality: N/A;   HYSTEROSCOPY WITH D & C N/A 02/15/2015   Procedure: DILATATION AND CURETTAGE /HYSTEROSCOPY;  Surgeon: Mitchel Honour, DO;  Location: [facility];  Service: Gynecology;  Laterality: N/A;   TONSILLECTOMY     Current Outpatient Medications on File Prior to Visit  Medication Sig Dispense Refill   ACCU-CHEK GUIDE test strip TEST BLOOD SUGAR THREE TIMES DAILY. 300 strip 3   Accu-Chek Softclix Lancets lancets TEST BLOOD SUGAR THREE TIMES DAILY 300 each 3   Alcohol Swabs (DROPSAFE ALCOHOL PREP) 70 % PADS USE THREE TIMES DAILY 300 each 3   Blood Glucose Calibration (TRUE METRIX LEVEL 2) Normal SOLN Use as directed to monitor FSBS 3x daily. Dx: E11.65 1 each 1   Blood Glucose Monitoring Suppl DEVI 1 each by Does not apply route in the morning, at noon, and at bedtime. May substitute to any manufacturer covered by patient's insurance. 1 each 0   cholecalciferol (VITAMIN D3) 25 MCG (1000 UNIT) tablet Take 1,000 Units by mouth daily.     cyanocobalamin (VITAMIN B12) 1000 MCG tablet Take 1,000 mcg by mouth daily.     Dulaglutide (TRULICITY) 3 MG/0.5ML SOPN Inject 3 mg as directed once a week. 6 mL 3   FARXIGA 10 MG TABS tablet TAKE 1 TABLET EVERY DAY BEFORE BREAKFAST 90 tablet 3   furosemide (LASIX) 20 MG tablet TAKE 1 TABLET EVERY DAY 90 tablet 3   gabapentin (NEURONTIN) 300 MG capsule TAKE 1 CAPSULE THREE TIMES DAILY 270 capsule 3   metFORMIN (GLUCOPHAGE) 1000 MG tablet TAKE 1 TABLET TWICE DAILY WITH MEALS 180 tablet 3   metoprolol tartrate (LOPRESSOR) 50 MG tablet TAKE 1/2 TABLET TWICE DAILY 90 tablet 3  Multiple Vitamin (MULTIVITAMIN) tablet Take 1 tablet by mouth daily.     ondansetron (ZOFRAN) 4 MG tablet TAKE 1 TABLET (4 MG TOTAL) BY MOUTH EVERY 8 (EIGHT) HOURS AS NEEDED FOR NAUSEA OR VOMITING. 60 tablet 3   pantoprazole (PROTONIX) 40 MG tablet TAKE 1 TABLET TWICE DAILY BEFORE MEALS 180 tablet 3   potassium chloride (KLOR-CON M) 10 MEQ tablet TAKE 1 TABLET EVERY DAY (NEED MD APPOINTMENT)  90 tablet 3   pravastatin (PRAVACHOL) 40 MG tablet TAKE 1 TABLET EVERY DAY 90 tablet 0   traMADol (ULTRAM) 50 MG tablet TAKE 1 TABLET BY MOUTH EVERY 6 HOURS AS NEEDED FOR  CHRONIC  BACK  PAIN 60 tablet 0   aspirin EC 81 MG tablet Take 81 mg by mouth daily. (Patient not taking: Reported on 04/28/2023)     No current facility-administered medications on file prior to visit.   Allergies  Allergen Reactions   Codeine Anaphylaxis and Hives   Penicillins Anaphylaxis, Hives and Other (See Comments)    Has patient had a PCN reaction causing immediate rash, facial/tongue/throat swelling, SOB or lightheadedness with hypotension: No Has patient had a PCN reaction causing severe rash involving mucus membranes or skin necrosis: No Has patient had a PCN reaction that required hospitalization: No Has patient had a PCN reaction occurring within the last 10 years: No If all of the above answers are "NO", then may proceed with Cephalosporin use.    Cayenne Swelling    Lips swelled   Ciprofloxacin Other (See Comments)    Tongue Swelling   Food Nausea And Vomiting and Other (See Comments)    Marquis Buggy    Januvia [Sitagliptin] Other (See Comments)    Stomach cramps   Latex Hives   Social History   Socioeconomic History   Marital status: Single    Spouse name: Not on file   Number of children: 0   Years of education: Not on file   Highest education level: Not on file  Occupational History   Occupation: retired  Tobacco Use   Smoking status: Never   Smokeless tobacco: Never  Vaping Use   Vaping status: Never Used  Substance and Sexual Activity   Alcohol use: No    Comment: social drinker.   Drug use: No   Sexual activity: Not Currently  Other Topics Concern   Not on file  Social History Narrative   Lives alone. 1 brother living.    Social Determinants of Health   Financial Resource Strain: Low Risk  (08/14/2022)   Overall Financial Resource Strain (CARDIA)    Difficulty of Paying Living  Expenses: Not hard at all  Food Insecurity: No Food Insecurity (08/14/2022)   Hunger Vital Sign    Worried About Running Out of Food in the Last Year: Never true    Ran Out of Food in the Last Year: Never true  Transportation Needs: No Transportation Needs (08/14/2022)   PRAPARE - Administrator, Civil Service (Medical): No    Lack of Transportation (Non-Medical): No  Physical Activity: Insufficiently Active (08/14/2022)   Exercise Vital Sign    Days of Exercise per Week: 3 days    Minutes of Exercise per Session: 30 min  Stress: No Stress Concern Present (08/14/2022)   [employer] of Occupational Health - Occupational Stress Questionnaire    Feeling of Stress : Not at all  Social Connections: Moderately Integrated (08/14/2022)   Social Connection and Isolation Panel [NHANES]    Frequency of Communication  with Friends and Family: More than three times a week    Frequency of Social Gatherings with Friends and Family: Three times a week    Attends Religious Services: 1 to 4 times per year    Active Member of Clubs or Organizations: Yes    Attends Banker Meetings: More than 4 times per year    Marital Status: Never married  Intimate Partner Violence: Not At Risk (08/14/2022)   Humiliation, Afraid, Rape, and Kick questionnaire    Fear of Current or Ex-Partner: No    Emotionally Abused: No    Physically Abused: No    Sexually Abused: No   Family History  Problem Relation Age of Onset   Hyperlipidemia Mother    Heart disease Father    Hyperlipidemia Father    Hypertension Father    Kidney disease Sister    Kidney cancer Sister    Ovarian cancer Maternal Aunt    Kidney disease Maternal Aunt        x 2   Kidney disease Maternal Uncle        x 2   Heart disease Brother    Stroke Brother    Heart disease Brother    Colon cancer Neg Hx    Stomach cancer Neg Hx    Breast cancer Neg Hx       Review of Systems  All other systems reviewed and are  negative.      Objective:   Physical Exam Vitals reviewed.  Constitutional:      General: She is not in acute distress.    Appearance: She is well-developed. She is not diaphoretic.  HENT:     Head: Normocephalic and atraumatic.     Right Ear: External ear normal.     Left Ear: External ear normal.     Nose: Nose normal.     Mouth/Throat:     Pharynx: No oropharyngeal exudate.  Eyes:     General: No scleral icterus.       Right eye: No discharge.        Left eye: No discharge.     Conjunctiva/sclera: Conjunctivae normal.     Pupils: Pupils are equal, round, and reactive to light.  Neck:     Thyroid: No thyromegaly.     Vascular: No JVD.     Trachea: No tracheal deviation.  Cardiovascular:     Rate and Rhythm: Normal rate and regular rhythm.     Heart sounds: Normal heart sounds. No murmur heard.    No friction rub. No gallop.  Pulmonary:     Effort: Pulmonary effort is normal. No respiratory distress.     Breath sounds: Normal breath sounds. No stridor. No wheezing or rales.  Chest:     Chest wall: No tenderness.  Abdominal:     General: Bowel sounds are normal. There is no distension.     Palpations: Abdomen is soft. There is no mass.     Tenderness: There is no abdominal tenderness. There is no guarding or rebound.  Musculoskeletal:        General: No tenderness. Normal range of motion.     Cervical back: Normal range of motion and neck supple.       Feet:  Feet:     Left foot:     Skin integrity: Ulcer present.  Lymphadenopathy:     Cervical: No cervical adenopathy.  Skin:    General: Skin is warm.     Coloration: Skin is  not pale.     Findings: No erythema or rash.  Neurological:     Mental Status: She is alert and oriented to person, place, and time.     Cranial Nerves: No cranial nerve deficit.     Motor: No abnormal muscle tone.     Coordination: Coordination normal.     Deep Tendon Reflexes: Reflexes are normal and symmetric.  Psychiatric:         Behavior: Behavior normal.        Thought Content: Thought content normal.        Judgment: Judgment normal.         Assessment & Plan:   Type 2 diabetes mellitus with complications (HCC)  NASH (nonalcoholic steatohepatitis)  Lymphedema  Essential hypertension Podiatry is managing ulcer on left foot.  A1c is outstanding.  Stop lisinopril due to hypotension and recent angioedema.  BP is ok.  Switch to mounjaro from trulicity to get more weight loss if insurance will cover.  Patient will check.  LDL is above goal (<100).  Patient to work on diet and weight loss with mounjaro.  Recheck in 6 months

## 2023-05-01 ENCOUNTER — Other Ambulatory Visit: Payer: Self-pay | Admitting: Family Medicine

## 2023-05-02 ENCOUNTER — Ambulatory Visit
Admission: RE | Admit: 2023-05-02 | Discharge: 2023-05-02 | Disposition: A | Discharge status: Home / Self Care | Payer: Medicare HMO | Source: Ambulatory Visit | Attending: Podiatry | Admitting: Podiatry

## 2023-05-02 DIAGNOSIS — L97522 Non-pressure chronic ulcer of other part of left foot with fat layer exposed: Secondary | ICD-10-CM

## 2023-05-02 DIAGNOSIS — L97529 Non-pressure chronic ulcer of other part of left foot with unspecified severity: Secondary | ICD-10-CM | POA: Diagnosis not present

## 2023-05-02 NOTE — Telephone Encounter (Signed)
Requested Prescriptions  Pending Prescriptions Disp Refills   FARXIGA 10 MG TABS tablet [Pharmacy Med Name: FARXIGA 10 MG Tablet] 90 tablet 3    Sig: TAKE 1 TABLET EVERY DAY BEFORE BREAKFAST     Endocrinology:  Diabetes - SGLT2 Inhibitors Failed - 05/01/2023 11:41 AM      Failed - Valid encounter within last 6 months    Recent Outpatient Visits           1 year ago Type 2 diabetes mellitus with complications (HCC)   [facility] Pickard, Priscille Heidelberg, MD   1 year ago Fever, unspecified fever cause   [facility] Valentino Nose, NP   2 years ago Type 2 diabetes mellitus with complications (HCC)   [facility] Donita Brooks, MD   2 years ago Type 2 diabetes mellitus with complications (HCC)   [facility] Donita Brooks, MD   3 years ago Type 2 diabetes mellitus with complications (HCC)   [facility] Pickard, Priscille Heidelberg, MD       Future Appointments             In 6 months Pickard, Priscille Heidelberg, MD [facility]            Passed - Cr in normal range and within 360 days    Creat  Date Value Ref Range Status  04/23/2023 1.04 0.50 - 1.05 mg/dL Final   Creatinine, Urine  Date Value Ref Range Status  04/23/2023 99 20 - 275 mg/dL Final         Passed - HBA1C is between 0 and 7.9 and within 180 days    Hgb A1c MFr Bld  Date Value Ref Range Status  04/23/2023 6.6 (H) <5.7 % of total Hgb Final    Comment:    For someone without known diabetes, a hemoglobin A1c value of 6.5% or greater indicates that they may have  diabetes and this should be confirmed with a follow-up  test. . For someone with known diabetes, a value <7% indicates  that their diabetes is well controlled and a value  greater than or equal to 7% indicates suboptimal  control. A1c targets should be individualized based on  duration of diabetes, age, comorbid conditions, and  other  considerations. . Currently, no consensus exists regarding use of hemoglobin A1c for diagnosis of diabetes for children. .          Passed - eGFR in normal range and within 360 days    GFR, Est African American  Date Value Ref Range Status  09/25/2020 69 > OR = 60 mL/min/1.34m2 Final   GFR, Est Non African American  Date Value Ref Range Status  09/25/2020 59 (L) > OR = 60 mL/min/1.102m2 Final   eGFR  Date Value Ref Range Status  04/23/2023 60 > OR = 60 mL/min/1.46m2 Final

## 2023-05-08 ENCOUNTER — Other Ambulatory Visit: Payer: Self-pay | Admitting: Family Medicine

## 2023-05-09 NOTE — Telephone Encounter (Signed)
Unable to refill per protocol, Rx expired. Discontinued 04/28/23.  Requested Prescriptions  Pending Prescriptions Disp Refills   lisinopril (ZESTRIL) 5 MG tablet [Pharmacy Med Name: Lisinopril Oral Tablet 5 MG] 90 tablet 3    Sig: TAKE 1 TABLET EVERY DAY     Cardiovascular:  ACE Inhibitors Failed - 05/08/2023 10:40 AM      Failed - Valid encounter within last 6 months    Recent Outpatient Visits           1 year ago Type 2 diabetes mellitus with complications (HCC)   [facility] Pickard, Priscille Heidelberg, MD   1 year ago Fever, unspecified fever cause   [facility] Valentino Nose, NP   2 years ago Type 2 diabetes mellitus with complications (HCC)   [facility] Donita Brooks, MD   2 years ago Type 2 diabetes mellitus with complications (HCC)   [facility] Donita Brooks, MD   3 years ago Type 2 diabetes mellitus with complications (HCC)   [facility] Pickard, Priscille Heidelberg, MD       Future Appointments             In 5 months Pickard, Priscille Heidelberg, MD [facility]            Passed - Cr in normal range and within 180 days    Creat  Date Value Ref Range Status  04/23/2023 1.04 0.50 - 1.05 mg/dL Final   Creatinine, Urine  Date Value Ref Range Status  04/23/2023 99 20 - 275 mg/dL Final         Passed - K in normal range and within 180 days    Potassium  Date Value Ref Range Status  04/23/2023 4.3 3.5 - 5.3 mmol/L Final         Passed - Patient is not pregnant      Passed - Last BP in normal range    BP Readings from Last 1 Encounters:  04/28/23 130/80

## 2023-05-14 ENCOUNTER — Encounter: Payer: Self-pay | Admitting: Podiatry

## 2023-05-14 ENCOUNTER — Ambulatory Visit: Payer: Medicare HMO | Admitting: Podiatry

## 2023-05-14 DIAGNOSIS — E0843 Diabetes mellitus due to underlying condition with diabetic autonomic (poly)neuropathy: Secondary | ICD-10-CM

## 2023-05-14 DIAGNOSIS — L97522 Non-pressure chronic ulcer of other part of left foot with fat layer exposed: Secondary | ICD-10-CM

## 2023-05-18 NOTE — Progress Notes (Signed)
Subjective:  Patient ID: Linda Briggs, female    DOB: 03-30-1956,  MRN: [id number]  Chief Complaint  Patient presents with   Wound Check    "I don't know how they're doing.  I had my MRI done."    67 y.o. female presents with the above complaint. History confirmed with patient.  She has been using the collagen    Objective:  Physical Exam: warm, good capillary refill, normal DP and PT pulses, and diffuse neuropathy, full-thickness ulceration on medial left first MPJ measuring 0.6 x 0.3 x 0.3 cm,,   ulcer plantar right heel 0.4 x 0.6 x 0.3 cm exposed subcutaneous tissue, surrounding macerated hyperkeratosis, serous drainage no purulent signs infection erythema or cellulitis.  No exposed bone tendon or joint.           ABIs without significant atherosclerosis Assessment:   1. Ulcer of left foot with fat layer exposed (HCC)   2. Diabetes mellitus due to underlying condition with diabetic autonomic neuropathy, unspecified whether long term insulin use (HCC)      Plan:  Patient was evaluated and treated and all questions answered.  Ulcer left foot -We discussed the etiology and factors that are a part of the wound healing process.  We also discussed the risk of infection both soft tissue and osteomyelitis from open ulceration.  Discussed the risk of limb loss if this happens or worsens. -Debridement as below. -Dressed with Prisma collagen and silver alginate to absorb drainage.  Use gauze and tape.  Change daily.   -We reviewed the results of her MRI which showed no signs of infection.  I do think proper offloading has been a challenge, I recommend she return to using the surgical shoe for the next few weeks until I see her back if there is improvement we will continue this.  If there is not improvement then I would recommend considering angiography, she was eval by Dr. Wyn Quaker about 6 months ago but has not shown any meaningful healing in this time.,  If he is agreeable this may  offer some insight.  Long-term also can consider grafting and or rotational flap.   Procedure: Excisional Debridement of Wound Rationale: Removal of non-viable soft tissue from the wound to promote healing.  Anesthesia: none Post-Debridement Wound Measurements: Noted above Type of Debridement: Sharp Excisional Tissue Removed: Non-viable soft tissue Depth of Debridement: subcutaneous tissue. Technique: Sharp excisional debridement to bleeding, viable wound base.  Dressing: Dry, sterile, compression dressing. Disposition: Patient tolerated procedure well.       Return in about 3 weeks (around 06/04/2023) for wound care.

## 2023-06-04 ENCOUNTER — Encounter: Payer: Self-pay | Admitting: Podiatry

## 2023-06-04 ENCOUNTER — Ambulatory Visit: Payer: Medicare HMO | Admitting: Podiatry

## 2023-06-04 VITALS — BP 134/81 | HR 65

## 2023-06-04 DIAGNOSIS — L97512 Non-pressure chronic ulcer of other part of right foot with fat layer exposed: Secondary | ICD-10-CM | POA: Diagnosis not present

## 2023-06-04 DIAGNOSIS — L97522 Non-pressure chronic ulcer of other part of left foot with fat layer exposed: Secondary | ICD-10-CM

## 2023-06-05 ENCOUNTER — Other Ambulatory Visit: Payer: Self-pay | Admitting: Family Medicine

## 2023-06-05 NOTE — Progress Notes (Signed)
  Subjective:  Patient ID: Linda Briggs, female    DOB: 02/07/1956,  MRN: [id number]  Chief Complaint  Patient presents with   Wound Check    "I think both of them are better."    67 y.o. female presents with the above complaint. History confirmed with patient.  She has been using the collagen, also doing quite well with using her lymphedema pumps and she feels like this is helping  Objective:  Physical Exam: warm, good capillary refill, normal DP and PT pulses, and diffuse neuropathy, full-thickness ulceration on medial left first MPJ measuring 0.3 x 0.3 x 0.3 cm,,   ulcer plantar right heel 0.2 x 0.6 x 0.3 cm exposed subcutaneous tissue, surrounding macerated hyperkeratosis, serous drainage no purulent signs infection erythema or cellulitis.  No exposed bone tendon or joint.            ABIs without significant atherosclerosis Assessment:   1. Ulcer of left foot with fat layer exposed (HCC)   2. Ulcer of right foot with fat layer exposed (HCC)      Plan:  Patient was evaluated and treated and all questions answered.  Ulcer left foot -We discussed the etiology and factors that are a part of the wound healing process.  We also discussed the risk of infection both soft tissue and osteomyelitis from open ulceration.  Discussed the risk of limb loss if this happens or worsens. -Debridement as below. -Dressed with Prisma collagen and silver alginate to absorb drainage.  Use gauze and tape.  Change daily.   -Doing quite well now I do believe that her reduction in her lymphedema is improving her wounds greatly she should continue using her pumps at home.  Change daily.   Procedure: Excisional Debridement of Wound Rationale: Removal of non-viable soft tissue from the wound to promote healing.  Anesthesia: none Post-Debridement Wound Measurements: Noted above Type of Debridement: Sharp Excisional Tissue Removed: Non-viable soft tissue Depth of Debridement: subcutaneous  tissue. Technique: Sharp excisional debridement to bleeding, viable wound base.  Dressing: Dry, sterile, compression dressing. Disposition: Patient tolerated procedure well.       Return in about 3 weeks (around 06/25/2023) for wound care.

## 2023-06-25 ENCOUNTER — Encounter: Payer: Self-pay | Admitting: Podiatry

## 2023-06-25 ENCOUNTER — Ambulatory Visit: Payer: Medicare HMO | Admitting: Podiatry

## 2023-06-25 ENCOUNTER — Other Ambulatory Visit: Payer: Self-pay | Admitting: Family Medicine

## 2023-06-25 VITALS — BP 133/78 | HR 63

## 2023-06-25 DIAGNOSIS — L97512 Non-pressure chronic ulcer of other part of right foot with fat layer exposed: Secondary | ICD-10-CM

## 2023-06-25 DIAGNOSIS — L97522 Non-pressure chronic ulcer of other part of left foot with fat layer exposed: Secondary | ICD-10-CM | POA: Diagnosis not present

## 2023-06-25 NOTE — Progress Notes (Signed)
  Subjective:  Patient ID: Linda Briggs, female    DOB: [birth year],  MRN: [id number]  Chief Complaint  Patient presents with   Wound Check    "Okay, I think.  I'm hoping anyway."    67 y.o. female presents with the above complaint. History confirmed with patient.  She has continued using the collagen and lymphedema pumps  Objective:  Physical Exam: warm, good capillary refill, normal DP and PT pulses, and diffuse neuropathy, full-thickness ulceration on medial left first MPJ measuring 0.5 x 0.3 x 0.3 cm,,   ulcer plantar right heel 0.2 x 0.2 x 0.3 cm exposed subcutaneous tissue, surrounding macerated hyperkeratosis, serous drainage no purulent signs infection erythema or cellulitis.  No exposed bone tendon or joint.               ABIs without significant atherosclerosis   Assessment:   1. Ulcer of left foot with fat layer exposed (HCC)   2. Ulcer of right foot with fat layer exposed (HCC)       Plan:  Patient was evaluated and treated and all questions answered.  Ulcer left foot -We discussed the etiology and factors that are a part of the wound healing process.  We also discussed the risk of infection both soft tissue and osteomyelitis from open ulceration.  Discussed the risk of limb loss if this happens or worsens. -Debridement as below. -Dressed with Prisma collagen and silver alginate to absorb drainage.  Use gauze and adhesive bandage.  Change daily.   -Continue lymph pump use   Procedure: Excisional Debridement of Wound Rationale: Removal of non-viable soft tissue from the wound to promote healing.  Anesthesia: none Post-Debridement Wound Measurements: Noted above Type of Debridement: Sharp Excisional Tissue Removed: Non-viable soft tissue Depth of Debridement: subcutaneous tissue. Technique: Sharp excisional debridement to bleeding, viable wound base.  Dressing: Dry, sterile, compression dressing. Disposition: Patient tolerated procedure well.        Return in about 3 weeks (around 07/16/2023) for wound care.

## 2023-06-26 NOTE — Telephone Encounter (Signed)
Requested Prescriptions  Pending Prescriptions Disp Refills   metFORMIN (GLUCOPHAGE) 1000 MG tablet [Pharmacy Med Name: metFORMIN HCl Oral Tablet 1000 MG] 180 tablet 1    Sig: TAKE 1 TABLET TWICE DAILY WITH MEALS     Endocrinology:  Diabetes - Biguanides Failed - 06/25/2023  4:07 AM      Failed - Valid encounter within last 6 months    Recent Outpatient Visits           1 year ago Type 2 diabetes mellitus with complications (HCC)   [facility] Pickard, Priscille Heidelberg, MD   2 years ago Fever, unspecified fever cause   [facility] Valentino Nose, NP   2 years ago Type 2 diabetes mellitus with complications (HCC)   [facility] Donita Brooks, MD   2 years ago Type 2 diabetes mellitus with complications (HCC)   [facility] Donita Brooks, MD   3 years ago Type 2 diabetes mellitus with complications (HCC)   [facility] Pickard, Priscille Heidelberg, MD       Future Appointments             In 4 months Pickard, Priscille Heidelberg, MD [facility], [facility]            Passed - Cr in normal range and within 360 days    Creat  Date Value Ref Range Status  04/23/2023 1.04 0.50 - 1.05 mg/dL Final   Creatinine, Urine  Date Value Ref Range Status  04/23/2023 99 20 - 275 mg/dL Final         Passed - HBA1C is between 0 and 7.9 and within 180 days    Hgb A1c MFr Bld  Date Value Ref Range Status  04/23/2023 6.6 (H) <5.7 % of total Hgb Final    Comment:    For someone without known diabetes, a hemoglobin A1c value of 6.5% or greater indicates that they may have  diabetes and this should be confirmed with a follow-up  test. . For someone with known diabetes, a value <7% indicates  that their diabetes is well controlled and a value  greater than or equal to 7% indicates suboptimal  control. A1c targets should be individualized based on  duration of diabetes, age, comorbid conditions, and   other considerations. . Currently, no consensus exists regarding use of hemoglobin A1c for diagnosis of diabetes for children. .          Passed - eGFR in normal range and within 360 days    GFR, Est African American  Date Value Ref Range Status  09/25/2020 69 > OR = 60 mL/min/1.41m2 Final   GFR, Est Non African American  Date Value Ref Range Status  09/25/2020 59 (L) > OR = 60 mL/min/1.43m2 Final   eGFR  Date Value Ref Range Status  04/23/2023 60 > OR = 60 mL/min/1.53m2 Final         Passed - B12 Level in normal range and within 720 days    Vitamin B-12  Date Value Ref Range Status  09/27/2022 247 200 - 1,100 pg/mL Final    Comment:    . Please Note: Although the reference range for vitamin B12 is [phone] pg/mL, it has been reported that between 5 and 10% of patients with values between 200 and 400 pg/mL may experience neuropsychiatric and hematologic abnormalities due to occult B12 deficiency; less than 1% of patients with values  above 400 pg/mL will have symptoms. .          Passed - CBC within normal limits and completed in the last 12 months    WBC  Date Value Ref Range Status  04/23/2023 10.9 (H) 3.8 - 10.8 Thousand/uL Final   RBC  Date Value Ref Range Status  04/23/2023 4.56 3.80 - 5.10 Million/uL Final   Hemoglobin  Date Value Ref Range Status  04/23/2023 14.2 11.7 - 15.5 g/dL Final   HCT  Date Value Ref Range Status  04/23/2023 42.3 35.0 - 45.0 % Final   MCHC  Date Value Ref Range Status  04/23/2023 33.6 32.0 - 36.0 g/dL Final   [facility]  Date Value Ref Range Status  04/23/2023 31.1 27.0 - 33.0 pg Final   MCV  Date Value Ref Range Status  04/23/2023 92.8 80.0 - 100.0 fL Final   No results found for: "PLTCOUNTKUC", "LABPLAT", "POCPLA" RDW  Date Value Ref Range Status  04/23/2023 13.4 11.0 - 15.0 % Final

## 2023-07-02 ENCOUNTER — Other Ambulatory Visit: Payer: Self-pay | Admitting: Family Medicine

## 2023-07-02 NOTE — Telephone Encounter (Signed)
Requested medication (s) are due for refill today: yes   Requested medication (s) are on the active medication list: yes   Last refill:  09/16/22 #300 3 refills   Future visit scheduled: yes in 4 months   Notes to clinic:  medication not assigned to a protocol do you want to refill Rx?     Requested Prescriptions  Pending Prescriptions Disp Refills   Alcohol Swabs (DROPSAFE ALCOHOL PREP) 70 % PADS [Pharmacy Med Name: DropSafe Alcohol Prep Pad 70 %] 300 each 3    Sig: USE AS DIRECTED THREE TIMES DAILY     Off-Protocol Failed - 07/02/2023  3:19 AM      Failed - Medication not assigned to a protocol, review manually.      Failed - Valid encounter within last 12 months    Recent Outpatient Visits           1 year ago Type 2 diabetes mellitus with complications (HCC)   [facility] Tanya Nones, Priscille Heidelberg, MD   2 years ago Fever, unspecified fever cause   [facility] Medicine Cathlean Marseilles A, NP   2 years ago Type 2 diabetes mellitus with complications (HCC)   [facility] Medicine Donita Brooks, MD   2 years ago Type 2 diabetes mellitus with complications (HCC)   [facility] Medicine Donita Brooks, MD   3 years ago Type 2 diabetes mellitus with complications (HCC)   [facility] Medicine Pickard, Priscille Heidelberg, MD       Future Appointments             In 4 months Pickard, Priscille Heidelberg, MD [facility], [facility]

## 2023-07-16 ENCOUNTER — Encounter: Payer: Self-pay | Admitting: Podiatry

## 2023-07-16 ENCOUNTER — Ambulatory Visit: Payer: Medicare HMO | Admitting: Podiatry

## 2023-07-16 DIAGNOSIS — L97522 Non-pressure chronic ulcer of other part of left foot with fat layer exposed: Secondary | ICD-10-CM

## 2023-07-16 NOTE — Progress Notes (Signed)
  Subjective:  Patient ID: Linda Briggs, female    DOB: 05-19-1956,  MRN: [id number]  Chief Complaint  Patient presents with   Wound Check    "Doing pretty good but not as good as the last time.  I haven't been using the pump like I should.  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 female presents with the above complaint. History confirmed with patient.  She has continued using the collagen, she has not been using her lymphedema pumps.  Objective:  Physical Exam: warm, good capillary refill, normal DP and PT pulses, and diffuse neuropathy, full-thickness ulceration on medial left first MPJ measuring 0.3 x 0.3 x 0.3 cm,,   ulcer plantar right heel has healed, left foot with exposed subcutaneous tissue, surrounding macerated hyperkeratosis, serous drainage no purulent signs infection erythema or cellulitis.  No exposed bone tendon or joint.               ABIs without significant atherosclerosis   Assessment:   1. Ulcer of left foot with fat layer exposed (HCC)       Plan:  Patient was evaluated and treated and all questions answered.  Ulcer left foot -We discussed the etiology and factors that are a part of the wound healing process.  We also discussed the risk of infection both soft tissue and osteomyelitis from open ulceration.  Discussed the risk of limb loss if this happens or worsens. -Debridement as below. -Dressed with Prisma collagen and silver alginate to absorb drainage.  Use gauze and adhesive bandage.  Change daily.   -Continue lymph pump use   Procedure: Excisional Debridement of Wound Rationale: Removal of non-viable soft tissue from the wound to promote healing.  Anesthesia: none Post-Debridement Wound Measurements: Noted above Type of Debridement: Sharp Excisional Tissue Removed: Non-viable soft tissue Depth of Debridement: subcutaneous tissue. Technique: Sharp excisional debridement to bleeding, viable wound base.  Dressing: Dry,  sterile, compression dressing. Disposition: Patient tolerated procedure well.       Return in about 4 weeks (around 08/13/2023) for wound care.

## 2023-07-18 ENCOUNTER — Ambulatory Visit: Payer: Self-pay | Admitting: Family Medicine

## 2023-07-18 NOTE — Telephone Encounter (Deleted)
Copied from CRM [id number]. Topic: Clinical - Red Word Triage >> Jul 18, 2023  2:42 PM Prudencio Pair wrote: Red Word that prompted transfer to Nurse Triage: Patient calling in stating that she thinks she has shingles. States she wants to know what to do. Pt stated she has a rash on back and left side as well and it's itchy. Blister type and not painful. She states she does have numbness on stomach and has been having upset stomach and diarrhea.

## 2023-07-18 NOTE — Telephone Encounter (Addendum)
Copied from CRM [id number]. Topic: Clinical - Red Word Triage >> Jul 18, 2023  2:42 PM Prudencio Pair wrote: Red Word that prompted transfer to Nurse Triage: Patient calling in stating that she thinks she has shingles. States she wants to know what to do. Pt stated she has a rash on back and left side as well and it's itchy. Blister type and not painful. She states she does have numbness on stomach and has been having upset stomach and diarrhea.   Chief Complaint: " I think I may have shingles" Symptoms:  Feeling tired , Rash on her left side  and on her back. It is really itchy. It is blistery. Patient noticed some numbness of her left side.  Frequency: First noticed it this morning. Pertinent Negatives: Patient denies  Disposition: [] ED /[x] Urgent Care (no appt availability in office) / [] Appointment(In office/virtual)/ []  [facility]/ [] Home Care/ [] Refused Recommended Disposition /[] [facility]/ []  Follow-up with PCP Additional Notes:  Diabetes Type 2  Reason for Disposition  [1] Shingles rash (matches SYMPTOMS) AND [2] onset < 72 hours ago (3 days)  Answer Assessment - Initial Assessment Questions 1. APPEARANCE of RASH: "Describe the rash."       2. LOCATION: "Where is the rash located?"       Left Side  and left side of  back 3. ONSET: "When did the rash start?"      May have started it last night . First noticed it this morning. 4. ITCHING: "Does the rash itch?" If Yes, ask: "How bad is the itch?"  (Scale 1-10; or mild, moderate, severe) Itchy      5. PAIN: "Does the rash hurt?" If Yes, ask: "How bad is the pain?"  (Scale 0-10; or none, mild, moderate, severe) No pain at this time it is only tingling.  Rates pain as a1 .    - NONE (0): no pain    - MILD (1-3): doesn't interfere with normal activities     - MODERATE (4-7): interferes with normal activities or awakens from sleep     - SEVERE (8-10): excruciating pain, unable to do any normal activities      6. OTHER  SYMPTOMS: "Do you have any other symptoms?" (e.g., fever) Diarrhea and Fatigue     Numbness and itchy 7. PREGNANCY: "Is there any chance you are pregnant?" "When was your last menstrual period?"     *No Answer*  Protocols used: Shingles (Zoster)-A-AH

## 2023-07-19 ENCOUNTER — Ambulatory Visit
Admission: RE | Admit: 2023-07-19 | Discharge: 2023-07-19 | Disposition: A | Discharge status: Home / Self Care | Payer: Medicare HMO | Source: Ambulatory Visit | Attending: Emergency Medicine | Admitting: Emergency Medicine

## 2023-07-19 VITALS — BP 137/83 | HR 71 | Temp 98.6°F | Resp 18

## 2023-07-19 DIAGNOSIS — B029 Zoster without complications: Secondary | ICD-10-CM

## 2023-07-19 MED ORDER — VALACYCLOVIR HCL 1 G PO TABS: 1000.0000 mg | ORAL_TABLET | Freq: Three times a day (TID) | ORAL | 0 refills | Status: DC

## 2023-07-19 NOTE — ED Triage Notes (Signed)
Patient to Urgent Care with complaints of rash present on her left side. Denies any pain. Area is itching/ tingling/ burning.   Reports some areas are blistered and others have crusted over.  Denies any fevers. Fatigue and diarrhea last week. Developed rash last night.

## 2023-07-19 NOTE — Discharge Instructions (Addendum)
Take the Valtrex as directed for shingles.  Follow-up with your primary care provider.

## 2023-07-19 NOTE — ED Provider Notes (Signed)
Linda Briggs    CSN: [id number] Arrival date & time: 07/19/23  1411      History   Chief Complaint Chief Complaint  Patient presents with   Rash    Patient thinks she may have Shingles. She noticed a rash that is on her left side and it has become blistery in some areas and a few that have crust. - Entered by patient    HPI Linda Briggs is a 67 y.o. female.  Patient presents with rash on her left side since yesterday afternoon.  Some areas have crusted over.  The rash is mildly pruritic and tingling/burning.  She denies fever, chest pain, shortness of breath, or other symptoms.  No OTC medications today.  Her medical history includes diabetes and hypertension.  The history is provided by the patient and medical records.    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    Peripheral vascular disease (HCC)    Shortness of breath dyspnea    on lopressor    Sleep apnea    pt had study done but it was incomplete due to blood sugar issues     Patient Active Problem List   Diagnosis Date Noted   Ulcerated, foot, left, limited to breakdown of skin (HCC) 03/18/2023   NASH (nonalcoholic steatohepatitis) 12/26/2022   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   Lymphedema 03/13/2021   Ulcerated, foot, right, with fat layer exposed (HCC) 08/18/2020   Abnormal barium swallow    Nonproliferative diabetic retinopathy (HCC)    Globus pharyngeus 02/24/2018   Laryngopharyngeal reflux (LPR) 02/24/2018   Paroxysmal tachycardia (HCC) 01/07/2018   Morbid obesity (HCC) 01/13/2017   Pure hypercholesterolemia 01/11/2017   Localized edema 01/11/2017   Diabetic retinopathy (HCC) 05/29/2016   Abdominal pain  03/28/2014   Type 2 diabetes mellitus with complications (HCC) 03/28/2014   Weakness 07/26/2013   Hyperlipidemia    Essential hypertension    Shortness of breath 03/27/2012   Edema 03/27/2012   Near syncope 03/27/2012   Palpitations 03/27/2012    Past Surgical History:  Procedure Laterality Date   BIOPSY  04/28/2018   Procedure: BIOPSY;  Surgeon: Meryl Dare, MD;  Location: [facility];  Service: Endoscopy;;   BIOPSY THYROID     january 2016   ESOPHAGOGASTRODUODENOSCOPY (EGD) WITH PROPOFOL N/A 04/28/2018   Procedure: ESOPHAGOGASTRODUODENOSCOPY (EGD) WITH PROPOFOL;  Surgeon: Meryl Dare, MD;  Location: [facility];  Service: Endoscopy;  Laterality: N/A;   HYSTEROSCOPY WITH D & C N/A 02/15/2015   Procedure: DILATATION AND CURETTAGE /HYSTEROSCOPY;  Surgeon: Mitchel Honour, DO;  Location: [facility];  Service: Gynecology;  Laterality: N/A;   TONSILLECTOMY      OB History   No obstetric history on file.      Home Medications    Prior to Admission medications   Medication Sig Start Date End Date Taking? Authorizing Provider  valACYclovir (VALTREX) 1000 MG tablet Take 1 tablet (1,000 mg total) by mouth 3 (three) times daily. 07/19/23  Yes Mickie Bail, NP  ACCU-CHEK GUIDE test strip TEST BLOOD SUGAR THREE TIMES DAILY. 11/25/22  Donita Brooks, MD  Accu-Chek Softclix Lancets lancets TEST BLOOD SUGAR THREE TIMES DAILY 11/01/22   Donita Brooks, MD  Alcohol Swabs (DROPSAFE ALCOHOL PREP) 70 % PADS USE THREE TIMES DAILY 09/16/22   Donita Brooks, MD  aspirin EC 81 MG tablet Take 81 mg by mouth daily. Patient not taking: Reported on 04/28/2023    [provider]  Blood Glucose Calibration (TRUE METRIX LEVEL 2) Normal SOLN Use as directed to monitor FSBS 3x daily. Dx: E11.65 02/10/18   Salley Scarlet, MD  Blood Glucose Monitoring Suppl DEVI 1 each by Does not apply route in the morning, at noon, and at bedtime. May substitute to any manufacturer covered by patient's  insurance. 11/01/22   Donita Brooks, MD  cholecalciferol (VITAMIN D3) 25 MCG (1000 UNIT) tablet Take 1,000 Units by mouth daily.    [provider]  cyanocobalamin (VITAMIN B12) 1000 MCG tablet Take 1,000 mcg by mouth daily.    [provider]  Dulaglutide (TRULICITY) 3 MG/0.5ML SOPN Inject 3 mg as directed once a week. 10/24/22   Donita Brooks, MD  FARXIGA 10 MG TABS tablet TAKE 1 TABLET EVERY DAY BEFORE BREAKFAST 05/02/23   Donita Brooks, MD  furosemide (LASIX) 20 MG tablet TAKE 1 TABLET EVERY DAY 11/26/22   Donita Brooks, MD  gabapentin (NEURONTIN) 300 MG capsule TAKE 1 CAPSULE THREE TIMES DAILY 11/25/22   Donita Brooks, MD  metFORMIN (GLUCOPHAGE) 1000 MG tablet TAKE 1 TABLET TWICE DAILY WITH MEALS 06/26/23   Donita Brooks, MD  metoprolol tartrate (LOPRESSOR) 50 MG tablet TAKE 1/2 TABLET TWICE DAILY 12/18/22   Antonieta Iba, MD  Multiple Vitamin (MULTIVITAMIN) tablet Take 1 tablet by mouth daily.    [provider]  ondansetron (ZOFRAN) 4 MG tablet TAKE 1 TABLET (4 MG TOTAL) BY MOUTH EVERY 8 (EIGHT) HOURS AS NEEDED FOR NAUSEA OR VOMITING. 05/25/21   Donita Brooks, MD  pantoprazole (PROTONIX) 40 MG tablet TAKE 1 TABLET TWICE DAILY BEFORE MEALS 11/18/22   Donita Brooks, MD  potassium chloride (KLOR-CON M) 10 MEQ tablet TAKE 1 TABLET EVERY DAY (NEED MD APPOINTMENT) 12/18/22   Donita Brooks, MD  pravastatin (PRAVACHOL) 40 MG tablet TAKE 1 TABLET EVERY DAY 06/05/23   Donita Brooks, MD  traMADol (ULTRAM) 50 MG tablet TAKE 1 TABLET BY MOUTH EVERY 6 HOURS AS NEEDED FOR  CHRONIC  BACK  PAIN 10/20/18   Donita Brooks, MD    Family History Family History  Problem Relation Age of Onset   Hyperlipidemia Mother    Heart disease Father    Hyperlipidemia Father    Hypertension Father    Kidney disease Sister    Kidney cancer Sister    Ovarian cancer Maternal Aunt    Kidney disease Maternal Aunt        x 2   Kidney disease Maternal Uncle         x 2   Heart disease Brother    Stroke Brother    Heart disease Brother    Colon cancer Neg Hx    Stomach cancer Neg Hx    Breast cancer Neg Hx     Social History Social History   Tobacco Use   Smoking status: Never   Smokeless tobacco: Never  Vaping Use   Vaping status: Never Used  Substance Use Topics   Alcohol use: No    Comment: social drinker.   Drug use: No  Allergies   Codeine, Penicillins, Cayenne, Ciprofloxacin, Food, Januvia [sitagliptin], and Latex   Review of Systems Review of Systems  Constitutional:  Negative for chills and fever.  HENT:  Negative for ear pain and sore throat.   Respiratory:  Negative for cough and shortness of breath.   Cardiovascular:  Negative for chest pain and palpitations.  Skin:  Positive for rash. Negative for color change.     Physical Exam Triage Vital Signs ED Triage Vitals [07/19/23 1423]  Encounter Vitals Group     BP      Systolic BP Percentile      Diastolic BP Percentile      Pulse Rate 71     Resp 18     Temp 98.6 F (37 C)     Temp src      SpO2 95 %     Weight      Height      Head Circumference      Peak Flow      Pain Score      Pain Loc      Pain Education      Exclude from Growth Chart    No data found.  Updated Vital Signs BP 137/83   Pulse 71   Temp 98.6 F (37 C)   Resp 18   SpO2 95%   Visual Acuity Right Eye Distance:   Left Eye Distance:   Bilateral Distance:    Right Eye Near:   Left Eye Near:    Bilateral Near:     Physical Exam Constitutional:      General: She is not in acute distress. HENT:     Mouth/Throat:     Mouth: Mucous membranes are moist.  Cardiovascular:     Rate and Rhythm: Normal rate and regular rhythm.  Pulmonary:     Effort: Pulmonary effort is normal. No respiratory distress.  Skin:    General: Skin is warm and dry.     Findings: Rash present.     Comments: Patchy papular and vesicular rash on left anterior flank.  No drainage or erythema.   Neurological:     Mental Status: She is alert.      UC Treatments / Results  Labs (all labs ordered are listed, but only abnormal results are displayed) Labs Reviewed - No data to display  EKG   Radiology No results found.  Procedures Procedures (including critical care time)  Medications Ordered in UC Medications - No data to display  Initial Impression / Assessment and Plan / UC Course  I have reviewed the triage vital signs and the nursing notes.  Pertinent labs & imaging results that were available during my care of the patient were reviewed by me and considered in my medical decision making (see chart for details).   Herpes zoster.  Afebrile and vital signs are stable.  The rash appears to be shingles.  Treating today with Valtrex.  Tylenol or ibuprofen as needed for discomfort.  Education provided on shingles.  Instructed patient to follow-up with her PCP.  She agrees to plan of care.    Final Clinical Impressions(s) / UC Diagnoses   Final diagnoses:  Herpes zoster without complication     Discharge Instructions      Take the Valtrex as directed for shingles.  Follow-up with your primary care provider.     ED Prescriptions     Medication Sig Dispense Auth. Provider   valACYclovir (VALTREX) 1000 MG tablet Take 1 tablet (  1,000 mg total) by mouth 3 (three) times daily. 21 tablet Mickie Bail, NP      PDMP not reviewed this encounter.   Mickie Bail, NP 07/19/23 [phone]

## 2023-08-13 ENCOUNTER — Ambulatory Visit: Payer: Medicare HMO | Admitting: Podiatry

## 2023-08-20 ENCOUNTER — Encounter: Payer: Self-pay | Admitting: Podiatry

## 2023-08-20 ENCOUNTER — Ambulatory Visit: Payer: Medicare HMO | Admitting: Podiatry

## 2023-08-20 DIAGNOSIS — L97522 Non-pressure chronic ulcer of other part of left foot with fat layer exposed: Secondary | ICD-10-CM

## 2023-08-24 NOTE — Progress Notes (Signed)
  Subjective:  Patient ID: Linda Briggs, female    DOB: [birth year],  MRN: [id number]  Chief Complaint  Patient presents with   Diabetic Ulcer    "It's not doing good.  I had Shingles so I didn't do the leg pumps and I redressed it every other day.  I have the callus on the side of my left foot.  Both my big toenails have bruises underneath them."    67 y.o. female presents with the above complaint. History confirmed with patient.    Objective:  Physical Exam: warm, good capillary refill, normal DP and PT pulses, and diffuse neuropathy, full-thickness ulceration on medial left first MPJ measuring 0.8 x 0.4 x 0.3 cm,,   ulcer plantar right heel has healed, left foot with exposed subcutaneous tissue, surrounding macerated hyperkeratosis, serous drainage no purulent signs infection erythema or cellulitis.  No exposed bone tendon or joint.               ABIs without significant atherosclerosis   Assessment:   1. Ulcer of left foot with fat layer exposed (HCC)        Plan:  Patient was evaluated and treated and all questions answered.  Ulcer left foot -We discussed the etiology and factors that are a part of the wound healing process.  We also discussed the risk of infection both soft tissue and osteomyelitis from open ulceration.  Discussed the risk of limb loss if this happens or worsens. -Debridement as below. -Dressed with Prisma collagen and silver alginate to absorb drainage.  Use gauze and adhesive bandage.  Change daily.   -Continue lymph pump use, she previously improved quite a bit while using these consistently   Procedure: Excisional Debridement of Wound Rationale: Removal of non-viable soft tissue from the wound to promote healing.  Anesthesia: none Post-Debridement Wound Measurements: Noted above Type of Debridement: Sharp Excisional Tissue Removed: Non-viable soft tissue Depth of Debridement: subcutaneous tissue. Technique: Sharp excisional debridement  to bleeding, viable wound base.  Dressing: Dry, sterile, compression dressing. Disposition: Patient tolerated procedure well.       Return in about 4 weeks (around 09/17/2023) for wound care.

## 2023-09-07 ENCOUNTER — Other Ambulatory Visit: Payer: Self-pay | Admitting: Family Medicine

## 2023-09-17 ENCOUNTER — Ambulatory Visit: Payer: Medicare HMO | Admitting: Podiatry

## 2023-09-17 ENCOUNTER — Encounter: Payer: Self-pay | Admitting: Podiatry

## 2023-09-17 VITALS — Ht 66.0 in | Wt 273.0 lb

## 2023-09-17 DIAGNOSIS — L97522 Non-pressure chronic ulcer of other part of left foot with fat layer exposed: Secondary | ICD-10-CM | POA: Diagnosis not present

## 2023-09-17 NOTE — Progress Notes (Signed)
  Subjective:  Patient ID: Linda Briggs, female    DOB: 05-20-1956,  MRN: [id number]  Chief Complaint  Patient presents with   Wound Check    Pt is here for routine wound care on left foot due to ulcer.    68 y.o. female presents with the above complaint. History confirmed with patient.  Also has a new weeping placed on the right leg has seen vascular surgery for this  Objective:  Physical Exam: warm, good capillary refill, normal DP and PT pulses, and diffuse neuropathy, full-thickness ulceration on medial left first MPJ measuring 1.0 x 0.5 x 0.3 cm,,   ulcer plantar right heel has healed, left foot with exposed subcutaneous tissue, surrounding macerated hyperkeratosis, serous drainage no purulent signs infection erythema or cellulitis.  No exposed bone tendon or joint.               ABIs without significant atherosclerosis   Assessment:   1. Ulcer of left foot with fat layer exposed (HCC)        Plan:  Patient was evaluated and treated and all questions answered.  Ulcer left foot -We discussed the etiology and factors that are a part of the wound healing process.  We also discussed the risk of infection both soft tissue and osteomyelitis from open ulceration.  Discussed the risk of limb loss if this happens or worsens. -Debridement as below. -Dressed with Prisma collagen and silver alginate to absorb drainage.  Use gauze and adhesive bandage.  Change daily.   -Continue lymph pump use, she previously improved quite a bit while using these consistently   Procedure: Excisional Debridement of Wound Rationale: Removal of non-viable soft tissue from the wound to promote healing.  Anesthesia: none Post-Debridement Wound Measurements: Noted above Type of Debridement: Sharp Excisional Tissue Removed: Non-viable soft tissue Depth of Debridement: subcutaneous tissue. Technique: Sharp excisional debridement to bleeding, viable wound base.  Dressing: Dry, sterile,  compression dressing. Disposition: Patient tolerated procedure well.    Discussed if not improving on the right leg she should notify me and I am happy to refer to wound care center for this.   Return in about 3 weeks (around 10/08/2023) for wound care.

## 2023-09-21 ENCOUNTER — Other Ambulatory Visit: Payer: Self-pay | Admitting: Family Medicine

## 2023-10-08 ENCOUNTER — Ambulatory Visit: Payer: Medicare HMO | Admitting: Podiatry

## 2023-10-08 ENCOUNTER — Other Ambulatory Visit: Payer: Self-pay | Admitting: Family Medicine

## 2023-10-08 ENCOUNTER — Other Ambulatory Visit: Payer: Self-pay | Admitting: Cardiovascular Disease

## 2023-10-08 NOTE — Telephone Encounter (Signed)
 Requested by interface surescripts. Last OV 04/28/23. Future visit in 3 weeks .  Requested Prescriptions  Pending Prescriptions Disp Refills   potassium chloride  (KLOR-CON  M) 10 MEQ tablet [Pharmacy Med Name: Potassium Chloride  Crys ER Oral Tablet Extended Release 10 MEQ] 90 tablet 0    Sig: TAKE 1 TABLET EVERY DAY (NEED MD APPOINTMENT)     Endocrinology:  Minerals - Potassium Supplementation Failed - 10/08/2023 10:24 AM      Failed - Valid encounter within last 12 months    Recent Outpatient Visits           2 years ago Type 2 diabetes mellitus with complications (HCC)   [facility] Pickard, Butler DASEN, MD   2 years ago Fever, unspecified fever cause   [facility] Chandra Raisin A, NP   2 years ago Type 2 diabetes mellitus with complications (HCC)   [facility] Duanne Butler DASEN, MD   3 years ago Type 2 diabetes mellitus with complications (HCC)   [facility] Duanne Butler DASEN, MD   3 years ago Type 2 diabetes mellitus with complications (HCC)   [facility] Duanne Butler DASEN, MD       Future Appointments             In 3 weeks Pickard, Butler DASEN, MD [facility]            Passed - K in normal range and within 360 days    Potassium  Date Value Ref Range Status  04/23/2023 4.3 3.5 - 5.3 mmol/L Final         Passed - Cr in normal range and within 360 days    Creat  Date Value Ref Range Status  04/23/2023 1.04 0.50 - 1.05 mg/dL Final   Creatinine, Urine  Date Value Ref Range Status  04/23/2023 99 20 - 275 mg/dL Final          Accu-Chek Softclix Lancets lancets [Pharmacy Med Name: Accu-Chek Softclix Lancets Miscellaneous] 300 each 0    Sig: TEST BLOOD SUGAR THREE TIMES DAILY     Endocrinology: Diabetes - Testing Supplies Failed - 10/08/2023 10:24 AM      Failed - Valid encounter within last 12 months    Recent Outpatient Visits           2 years ago  Type 2 diabetes mellitus with complications (HCC)   [facility] Duanne Butler DASEN, MD   2 years ago Fever, unspecified fever cause   [facility] Chandra Raisin A, NP   2 years ago Type 2 diabetes mellitus with complications (HCC)   [facility] Duanne Butler DASEN, MD   3 years ago Type 2 diabetes mellitus with complications (HCC)   [facility] Duanne Butler DASEN, MD   3 years ago Type 2 diabetes mellitus with complications (HCC)   [facility] Pickard, Butler DASEN, MD       Future Appointments             In 3 weeks Pickard, Butler DASEN, MD [facility]

## 2023-10-15 ENCOUNTER — Encounter: Payer: Self-pay | Admitting: Podiatry

## 2023-10-15 ENCOUNTER — Ambulatory Visit: Payer: Medicare HMO | Admitting: Podiatry

## 2023-10-15 DIAGNOSIS — L97522 Non-pressure chronic ulcer of other part of left foot with fat layer exposed: Secondary | ICD-10-CM | POA: Diagnosis not present

## 2023-10-15 NOTE — Progress Notes (Signed)
 [facility] - Clinic Note  10/28/2023     CHIEF COMPLAINT Patient presents for Retina Follow Up   HISTORY OF PRESENT ILLNESS: Linda Briggs is a 68 y.o. female who presents to the clinic today for:   HPI     Retina Follow Up   Patient presents with  Dry AMD.  In right eye.  This started 9 months ago.  Duration of 9 months.  Since onset it is stable.  I, the attending physician,  performed the HPI with the patient and updated documentation appropriately.        Comments   9 month retina follow up AMD OU pt states vision maybe little worse she denies any flashes or floaters last reading 185       Last edited by Rennis Chris, MD on 10/28/2023  5:05 PM.    Pt states vision is stable, she has an appt with Dr. Zenaida Niece next month  Referring physician: Diona Foley, MD [address] [place],  [place] [address]  HISTORICAL INFORMATION:   Selected notes from the MEDICAL RECORD NUMBER Referred by Dr. Zenaida Niece for retinal eval LEE:  Ocular Hx- PMH-    CURRENT MEDICATIONS: No current outpatient medications on file. (Ophthalmic Drugs)   No current facility-administered medications for this visit. (Ophthalmic Drugs)   Current Outpatient Medications (Other)  Medication Sig   ACCU-CHEK GUIDE TEST test strip TEST BLOOD SUGAR THREE TIMES DAILY   Accu-Chek Softclix Lancets lancets TEST BLOOD SUGAR THREE TIMES DAILY   Alcohol Swabs (DROPSAFE ALCOHOL PREP) 70 % PADS USE AS DIRECTED THREE TIMES DAILY   aspirin EC 81 MG tablet Take 81 mg by mouth daily.   Blood Glucose Calibration (TRUE METRIX LEVEL 2) Normal SOLN Use as directed to monitor FSBS 3x daily. Dx: E11.65   Blood Glucose Monitoring Suppl DEVI 1 each by Does not apply route in the morning, at noon, and at bedtime. May substitute to any manufacturer covered by patient's insurance.   cholecalciferol (VITAMIN D3) 25 MCG (1000 UNIT) tablet Take 1,000 Units by mouth daily.   cyanocobalamin (VITAMIN B12) 1000 MCG  tablet Take 1,000 mcg by mouth daily.   Dulaglutide (TRULICITY) 3 MG/0.5ML SOPN Inject 3 mg as directed once a week.   FARXIGA 10 MG TABS tablet TAKE 1 TABLET EVERY DAY BEFORE BREAKFAST   furosemide (LASIX) 20 MG tablet TAKE 1 TABLET EVERY DAY   gabapentin (NEURONTIN) 300 MG capsule TAKE 1 CAPSULE THREE TIMES DAILY   metFORMIN (GLUCOPHAGE) 1000 MG tablet TAKE 1 TABLET TWICE DAILY WITH MEALS   metoprolol tartrate (LOPRESSOR) 50 MG tablet Take 0.5 tablets (25 mg total) by mouth 2 (two) times daily. PLEASE CALL [phone] TO SCHEDULE YEARLY APPOINTMENT PRIOR TO NEXT REFILL REQUEST. THANK YOU.   Multiple Vitamin (MULTIVITAMIN) tablet Take 1 tablet by mouth daily.   ondansetron (ZOFRAN) 4 MG tablet TAKE 1 TABLET (4 MG TOTAL) BY MOUTH EVERY 8 (EIGHT) HOURS AS NEEDED FOR NAUSEA OR VOMITING.   pantoprazole (PROTONIX) 40 MG tablet TAKE 1 TABLET TWICE DAILY BEFORE MEALS   potassium chloride (KLOR-CON M) 10 MEQ tablet TAKE 1 TABLET EVERY DAY (NEED MD APPOINTMENT)   pravastatin (PRAVACHOL) 40 MG tablet TAKE 1 TABLET EVERY DAY   traMADol (ULTRAM) 50 MG tablet TAKE 1 TABLET BY MOUTH EVERY 6 HOURS AS NEEDED FOR  CHRONIC  BACK  PAIN   valACYclovir (VALTREX) 1000 MG tablet Take 1 tablet (1,000 mg total) by mouth 3 (three) times daily.  No current facility-administered medications for this visit. (Other)   REVIEW OF SYSTEMS: ROS   Positive for: Endocrine, Eyes Negative for: Constitutional, Gastrointestinal, Neurological, Skin, Genitourinary, Musculoskeletal, HENT, Cardiovascular, Respiratory, Psychiatric, Allergic/Imm, Heme/Lymph Last edited by Etheleen Mayhew, COT on 10/28/2023  1:56 PM.       ALLERGIES Allergies  Allergen Reactions   Codeine Anaphylaxis and Hives   Penicillins Anaphylaxis, Hives and Other (See Comments)    Has patient had a PCN reaction causing immediate rash, facial/tongue/throat swelling, SOB or lightheadedness with hypotension: No Has patient had a PCN reaction causing  severe rash involving mucus membranes or skin necrosis: No Has patient had a PCN reaction that required hospitalization: No Has patient had a PCN reaction occurring within the last 10 years: No If all of the above answers are "NO", then may proceed with Cephalosporin use.    Cayenne Swelling    Lips swelled   Ciprofloxacin Other (See Comments)    Tongue Swelling   Food Nausea And Vomiting and Other (See Comments)    Olives    Januvia [Sitagliptin] Other (See Comments)    Stomach cramps   Latex Hives   PAST MEDICAL HISTORY 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    Peripheral vascular disease (HCC)    Shortness of breath dyspnea    on lopressor    Sleep apnea    pt had study done but it was incomplete due to blood sugar issues    Past Surgical History:  Procedure Laterality Date   BIOPSY  04/28/2018   Procedure: BIOPSY;  Surgeon: Meryl Dare, MD;  Location: [facility];  Service: Endoscopy;;   BIOPSY THYROID     january 2016   ESOPHAGOGASTRODUODENOSCOPY (EGD) WITH PROPOFOL N/A 04/28/2018   Procedure: ESOPHAGOGASTRODUODENOSCOPY (EGD) WITH PROPOFOL;  Surgeon: Meryl Dare, MD;  Location: [facility];  Service: Endoscopy;  Laterality: N/A;   HYSTEROSCOPY WITH D & C N/A 02/15/2015   Procedure: DILATATION AND CURETTAGE /HYSTEROSCOPY;  Surgeon: Mitchel Honour, DO;  Location: [facility];  Service: Gynecology;  Laterality: N/A;   TONSILLECTOMY     FAMILY HISTORY Family History  Problem Relation Age of Onset   Hyperlipidemia Mother    Heart disease Father    Hyperlipidemia Father    Hypertension Father    Kidney disease Sister    Kidney cancer Sister    Ovarian cancer Maternal Aunt    Kidney  disease Maternal Aunt        x 2   Kidney disease Maternal Uncle        x 2   Heart disease Brother    Stroke Brother    Heart disease Brother    Colon cancer Neg Hx    Stomach cancer Neg Hx    Breast cancer Neg Hx    SOCIAL HISTORY Social History   Tobacco Use   Smoking status: Never   Smokeless tobacco: Never  Vaping Use   Vaping status: Never Used  Substance Use Topics   Alcohol use: No    Comment: social drinker.   Drug use: No  OPHTHALMIC EXAM:  Base Eye Exam     Visual Acuity (Snellen - Linear)       Right Left   Dist [place] 20/25 -3 20/20 -1   Dist ph [place] 20/20 -2          Tonometry (Tonopen, 2:03 PM)       Right Left   Pressure 15 18         Pupils       Pupils Dark Light Shape React APD   Right PERRL 2 1 Round Brisk None   Left PERRL 2 1 Round Brisk None         Visual Fields       Left Right    Full Full         Extraocular Movement       Right Left    Full, Ortho Full, Ortho         Neuro/Psych     Oriented x3: Yes   Mood/Affect: Normal         Dilation     Both eyes: 2.5% Phenylephrine @ 2:03 PM           Slit Lamp and Fundus Exam     Slit Lamp Exam       Right Left   Lids/Lashes Dermatochalasis - upper lid Dermatochalasis - upper lid, mild MGD   Conjunctiva/Sclera temporal pinguecula temporal pinguecula   Cornea trace PEE trace PEE   Anterior Chamber deep and clear deep and clear   Iris Round and dilated, No NVI Round and dilated, No NVI   Lens 2-3+ Nuclear sclerosis with mild brunescence, 2-3+ Cortical cataract 2-3+ Nuclear sclerosis with mild brunescence, 2-3+ Cortical cataract   Anterior Vitreous Vitreous syneresis Vitreous syneresis         Fundus Exam       Right Left   Disc Pink and Sharp Pink and Sharp, +cupping, +PPP   C/D Ratio 0.5 0.6   Macula Blunted foveal reflex, Drusen, RPE mottling and clumping, rare MA, no edema Blunted foveal reflex, Drusen, RPE mottling and clumping, no heme or  edema   Vessels attenuated, Tortuous attenuated, Tortuous   Periphery Attached, scattered MA/DBH, scattered peripheral drusen, mild patches of pigmented lattice from 1000 to 1100 superiorly - good laser surrounding, and 0430-0700 inferiorly - good laser surrounding, elevated choroidal lesion at 1230 equator -- collapses under scleral depression -- vortex vein varix - stable, pigmented paving stone degeneration inferiorly, no new RT/RD/lattice, focal blot heme at 1000 equator Attached, mild reticular degeneration, peripheral drusen, mild lattice at 0100 - good laser surrounding, pigmented paving stone degeneration inferiorly, scattered IRH / DBH temporal and inferior periphery, punctate VR tuft at 1100 - good laser surrounding; scattered peripheral cystoid degeneration, no new RT/RD/lattice, focal blot heme at 1030           IMAGING AND PROCEDURES  Imaging and Procedures for 10/28/2023  OCT, Retina - OU - Both Eyes       Right Eye Quality was good. Central Foveal Thickness: 270. Progression has been stable. Findings include normal foveal contour, no IRF, no SRF, retinal drusen , vitreomacular adhesion (Hyporeflective elevated choroidal lesion superior periphery caught on widefield -- stable).   Left Eye Quality was good. Central Foveal Thickness: 276. Progression has been stable. Findings include normal foveal contour, no IRF, no SRF, retinal drusen , vitreomacular adhesion .   Notes *Images captured and stored on drive  Diagnosis / Impression:  NFP; no IRF/SRF OU +drusen OU  OD: Hyporeflective elevated choroidal lesion superior periphery caught on widefield -- stable (vortex vein varix)  Clinical management:  See below  Abbreviations: NFP - Normal foveal profile. CME - cystoid macular edema. PED - pigment epithelial detachment. IRF - intraretinal fluid. SRF - subretinal fluid. EZ - ellipsoid zone. ERM - epiretinal membrane. ORA - outer retinal atrophy. ORT - outer retinal tubulation.  SRHM - subretinal hyper-reflective material. IRHM - intraretinal hyper-reflective material            ASSESSMENT/PLAN:    ICD-10-CM   1. 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 Choroidal lesion  H31.8 OCT, Retina - OU - Both Eyes    3. Bilateral retinal lattice degeneration  H35.413     4. Bilateral retinal defect  H33.303     5. Essential hypertension  I10     6. Hypertensive retinopathy of both eyes  H35.033     7. Diabetes mellitus type 2 without retinopathy (HCC)  E11.9     8. Long term (current) use of oral hypoglycemic drugs  Z79.84     9. Long-term (current) use of injectable non-insulin antidiabetic drugs  Z79.85     10. Combined forms of age-related cataract of both eyes  H25.813      Age related macular degeneration, non-exudative, both eyes - early stage OU -- stable  - The incidence, anatomy, and pathology of dry AMD, risk of progression, and the AREDS and AREDS 2 study including smoking risks discussed with patient.  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 Lattice degeneration OU - OD: mild patches of pigmented lattice from 1000-1100 superiorly and 0430-0700 inferiorly - OS: mild lattice at 0100, VR tuft at 1030 - s/p laser retinopexy OD (06.20.23) -- good laser changes in place - s/p laser retinopexy OS (07.10.23) -- good laser changes in place - no new RT/RD or lattice OU - f/u in 1 yr, sooner prn  5,6. Hypertensive  retinopathy OU - discussed importance of tight BP control - monitor  7-9. Diabetes mellitus, type 2 without retinopathy  - A1c 6.6 on 08.21.24 - The incidence, risk factors for progression, natural history and treatment options for diabetic retinopathy  were discussed with patient.   - The need for close monitoring of blood glucose, blood pressure, and serum lipids, avoiding cigarette or any type of tobacco, and the need for long term follow up was also discussed with patient. - f/u in 1 year, sooner prn  10. Mixed Cataract OU - The symptoms of cataract, surgical options, and treatments and risks were discussed with patient. - discussed diagnosis and progression - under the expert management of Dr. Zenaida Niece  Ophthalmic Meds Ordered this visit:  No orders of the defined types were placed in this encounter.    Return in about 1 year (around 10/27/2024) for f/u non-exu ARMD OU, DFE, OCT.  There are no Patient Instructions on file for this visit.   Explained the diagnoses, plan,  and follow up with the patient and they expressed understanding.  Patient expressed understanding of the importance of proper follow up care.   This document serves as a record of services personally performed by Karie Chimera, MD, PhD. It was created on their behalf by Charlette Caffey, COT an ophthalmic technician. The creation of this record is the provider's dictation and/or activities during the visit.    Electronically signed by:  Charlette Caffey, COT  10/28/23 5:06 PM  This document serves as a record of services personally performed by Karie Chimera, MD, PhD. It was created on their behalf by Glee Arvin. Manson Passey, OA an ophthalmic technician. The creation of this record is the provider's dictation and/or activities during the visit.    Electronically signed by: Glee Arvin. Manson Passey, OA 10/28/23 5:06 PM  Karie Chimera, M.D., Ph.D. Diseases & Surgery of the Retina and Vitreous [facility]  I have reviewed the above documentation for accuracy and completeness, and I agree with the above. Karie Chimera, M.D., Ph.D. 10/28/23 5:08 PM   Abbreviations: M myopia (nearsighted); A astigmatism; H hyperopia (farsighted); P presbyopia; Mrx spectacle prescription;  CTL contact lenses; OD right eye; OS left eye; OU both eyes  XT exotropia; ET esotropia; PEK punctate epithelial keratitis; PEE punctate epithelial erosions; DES dry eye syndrome; MGD meibomian gland dysfunction; ATs artificial tears; PFAT's preservative free artificial tears; NSC nuclear sclerotic cataract; PSC posterior subcapsular cataract; ERM epi-retinal membrane; PVD posterior vitreous detachment; RD retinal detachment; DM diabetes mellitus; DR diabetic retinopathy; NPDR non-proliferative diabetic retinopathy; PDR proliferative diabetic retinopathy; CSME clinically significant macular edema; DME diabetic macular edema; dbh dot blot hemorrhages; CWS cotton wool spot; POAG primary open angle glaucoma; C/D cup-to-disc ratio; HVF humphrey visual field; GVF goldmann visual field; OCT optical coherence tomography; IOP intraocular pressure; BRVO Branch retinal vein occlusion; CRVO central retinal vein occlusion; CRAO central retinal artery occlusion; BRAO branch retinal artery occlusion; RT retinal tear; SB scleral buckle; PPV pars plana vitrectomy; VH Vitreous hemorrhage; PRP panretinal laser photocoagulation; IVK intravitreal kenalog; VMT vitreomacular traction; MH Macular hole;  NVD neovascularization of the disc; NVE neovascularization elsewhere; AREDS age related eye disease study; ARMD age related macular degeneration; POAG primary open angle glaucoma; EBMD epithelial/anterior basement membrane dystrophy; ACIOL anterior chamber intraocular lens; IOL intraocular lens; PCIOL posterior chamber intraocular lens; Phaco/IOL phacoemulsification with intraocular lens placement; PRK photorefractive keratectomy; LASIK laser assisted in situ  keratomileusis; HTN hypertension; DM diabetes mellitus; COPD chronic obstructive pulmonary disease

## 2023-10-16 NOTE — Progress Notes (Signed)
  Subjective:  Patient ID: Linda Briggs, female    DOB: 08/05/1956,  MRN: [id number]  Chief Complaint  Patient presents with   Wound Check    "I think it's doing better, it's not as deep."    68 y.o. female presents with the above complaint. History confirmed with patient.  She is doing better she has been more consistent about using her lymphedema pumps  Objective:  Physical Exam: warm, good capillary refill, normal DP and PT pulses, and diffuse neuropathy, full-thickness ulceration on medial left first MPJ measuring 0.6 x 0.1 x 0.2 cm,,   ulcer plantar right heel has healed, left foot with exposed subcutaneous tissue, surrounding macerated hyperkeratosis, serous drainage no purulent signs infection erythema or cellulitis.  No exposed bone tendon or joint.               ABIs without significant atherosclerosis   Assessment:   1. Ulcer of left foot with fat layer exposed (HCC)        Plan:  Patient was evaluated and treated and all questions answered.  Ulcer left foot -We discussed the etiology and factors that are a part of the wound healing process.  We also discussed the risk of infection both soft tissue and osteomyelitis from open ulceration.  Discussed the risk of limb loss if this happens or worsens. -Debridement as below. -Dressed with antibiotic ointment and small bandage -Continue lymph pump use, she previously improved quite a bit while using these consistently.  I think wound closure and prevention of recurrence is going to rely on control of her lymphedema   Procedure: Excisional Debridement of Wound Rationale: Removal of non-viable soft tissue from the wound to promote healing.  Anesthesia: none Post-Debridement Wound Measurements: Noted above Type of Debridement: Sharp Excisional Tissue Removed: Non-viable soft tissue Depth of Debridement: subcutaneous tissue. Technique: Sharp excisional debridement to bleeding, viable wound base.  Dressing:  Dry, sterile, compression dressing. Disposition: Patient tolerated procedure well.     Return in about 3 weeks (around 11/05/2023) for wound care.

## 2023-10-28 ENCOUNTER — Ambulatory Visit (INDEPENDENT_AMBULATORY_CARE_PROVIDER_SITE_OTHER): Payer: Medicare HMO | Admitting: Ophthalmology

## 2023-10-28 ENCOUNTER — Encounter (INDEPENDENT_AMBULATORY_CARE_PROVIDER_SITE_OTHER): Payer: Self-pay | Admitting: Ophthalmology

## 2023-10-28 DIAGNOSIS — H35413 Lattice degeneration of retina, bilateral: Secondary | ICD-10-CM

## 2023-10-28 DIAGNOSIS — Z7984 Long term (current) use of oral hypoglycemic drugs: Secondary | ICD-10-CM | POA: Diagnosis not present

## 2023-10-28 DIAGNOSIS — E119 Type 2 diabetes mellitus without complications: Secondary | ICD-10-CM

## 2023-10-28 DIAGNOSIS — H318 Other specified disorders of choroid: Secondary | ICD-10-CM | POA: Diagnosis not present

## 2023-10-28 DIAGNOSIS — H33303 Unspecified retinal break, bilateral: Secondary | ICD-10-CM | POA: Diagnosis not present

## 2023-10-28 DIAGNOSIS — H35033 Hypertensive retinopathy, bilateral: Secondary | ICD-10-CM

## 2023-10-28 DIAGNOSIS — H25813 Combined forms of age-related cataract, bilateral: Secondary | ICD-10-CM

## 2023-10-28 DIAGNOSIS — Z7985 Long-term (current) use of injectable non-insulin antidiabetic drugs: Secondary | ICD-10-CM

## 2023-10-28 DIAGNOSIS — H353131 Nonexudative age-related macular degeneration, bilateral, early dry stage: Secondary | ICD-10-CM | POA: Diagnosis not present

## 2023-10-28 DIAGNOSIS — I10 Essential (primary) hypertension: Secondary | ICD-10-CM

## 2023-10-29 ENCOUNTER — Telehealth: Payer: Self-pay

## 2023-10-29 NOTE — Telephone Encounter (Signed)
 Appt made for 10/30/23 at 11 am. Pt aware. Mjp,lpn  Copied from CRM [id number]. Topic: Clinical - Request for Lab/Test Order >> Oct 29, 2023 10:56 AM Martha Clan wrote: Reason for CRM: needs blood works done for diabetes check up, A1C. Preferably set before appointment on 11/03/2023.

## 2023-10-30 ENCOUNTER — Other Ambulatory Visit: Payer: Medicare HMO

## 2023-10-30 DIAGNOSIS — E782 Mixed hyperlipidemia: Secondary | ICD-10-CM

## 2023-10-30 DIAGNOSIS — E118 Type 2 diabetes mellitus with unspecified complications: Secondary | ICD-10-CM

## 2023-10-30 DIAGNOSIS — I10 Essential (primary) hypertension: Secondary | ICD-10-CM | POA: Diagnosis not present

## 2023-10-31 LAB — COMPLETE METABOLIC PANEL WITH GFR
AG Ratio: 1.7 (calc) (ref 1.0–2.5)
ALT: 16 U/L (ref 6–29)
AST: 15 U/L (ref 10–35)
Albumin: 4.1 g/dL (ref 3.6–5.1)
Alkaline phosphatase (APISO): 64 U/L (ref 37–153)
BUN: 14 mg/dL (ref 7–25)
CO2: 31 mmol/L (ref 20–32)
Calcium: 9.3 mg/dL (ref 8.6–10.4)
Chloride: 100 mmol/L (ref 98–110)
Creat: 0.89 mg/dL (ref 0.50–1.05)
Globulin: 2.4 g/dL (ref 1.9–3.7)
Glucose, Bld: 163 mg/dL — ABNORMAL HIGH (ref 65–99)
Potassium: 4.5 mmol/L (ref 3.5–5.3)
Sodium: 141 mmol/L (ref 135–146)
Total Bilirubin: 0.6 mg/dL (ref 0.2–1.2)
Total Protein: 6.5 g/dL (ref 6.1–8.1)
eGFR: 71 mL/min/{1.73_m2} (ref >=60)

## 2023-10-31 LAB — LIPID PANEL
Cholesterol: 141 mg/dL (ref <200)
HDL: 32 mg/dL — ABNORMAL LOW (ref >=50)
LDL Cholesterol (Calc): 85 mg/dL
Non-HDL Cholesterol (Calc): 109 mg/dL (ref <130)
Total CHOL/HDL Ratio: 4.4 (calc) (ref <5.0)
Triglycerides: 142 mg/dL (ref <150)

## 2023-10-31 LAB — CBC WITH DIFFERENTIAL/PLATELET
Absolute Lymphocytes: 2150 {cells}/uL (ref 850–3900)
Absolute Monocytes: 374 {cells}/uL (ref 200–950)
Basophils Absolute: 58 {cells}/uL (ref 0–200)
Basophils Relative: 0.7 %
Eosinophils Absolute: 141 {cells}/uL (ref 15–500)
Eosinophils Relative: 1.7 %
HCT: 40.2 % (ref 35.0–45.0)
Hemoglobin: 13.2 g/dL (ref 11.7–15.5)
MCH: 29.7 pg (ref 27.0–33.0)
MCHC: 32.8 g/dL (ref 32.0–36.0)
MCV: 90.5 fL (ref 80.0–100.0)
MPV: 10 fL (ref 7.5–12.5)
Monocytes Relative: 4.5 %
Neutro Abs: 5578 {cells}/uL (ref 1500–7800)
Neutrophils Relative %: 67.2 %
Platelets: 112 10*3/uL — ABNORMAL LOW (ref 140–400)
RBC: 4.44 10*6/uL (ref 3.80–5.10)
RDW: 13.3 % (ref 11.0–15.0)
Total Lymphocyte: 25.9 %
WBC: 8.3 10*3/uL (ref 3.8–10.8)

## 2023-10-31 LAB — HEMOGLOBIN A1C
Hgb A1c MFr Bld: 8 %{Hb} — ABNORMAL HIGH (ref <5.7)
Mean Plasma Glucose: 183 mg/dL
eAG (mmol/L): 10.1 mmol/L

## 2023-11-03 ENCOUNTER — Ambulatory Visit (INDEPENDENT_AMBULATORY_CARE_PROVIDER_SITE_OTHER): Payer: Medicare HMO | Admitting: Family Medicine

## 2023-11-03 ENCOUNTER — Encounter: Payer: Self-pay | Admitting: Family Medicine

## 2023-11-03 VITALS — BP 124/72 | HR 64 | Temp 97.7°F | Ht 66.0 in | Wt 284.6 lb

## 2023-11-03 DIAGNOSIS — E118 Type 2 diabetes mellitus with unspecified complications: Secondary | ICD-10-CM

## 2023-11-03 DIAGNOSIS — K7581 Nonalcoholic steatohepatitis (NASH): Secondary | ICD-10-CM

## 2023-11-03 DIAGNOSIS — Z7984 Long term (current) use of oral hypoglycemic drugs: Secondary | ICD-10-CM | POA: Diagnosis not present

## 2023-11-03 DIAGNOSIS — I890 Lymphedema, not elsewhere classified: Secondary | ICD-10-CM | POA: Diagnosis not present

## 2023-11-03 MED ORDER — GLIPIZIDE ER 10 MG PO TB24: 10.0000 mg | ORAL_TABLET | Freq: Every day | ORAL | 3 refills | Status: AC

## 2023-11-03 NOTE — Progress Notes (Signed)
 Subjective:    Patient ID: Linda Briggs, female    DOB: April 04, 1956, 68 y.o.   MRN: [id number]  Patient is here today for follow up of her DM2.  Since I last saw the patient, she had to discontinue [place] as well as Trulicity due to cost.  She is only taking metformin.  Her hemoglobin A1c has risen to 8.  She has weeping edema secondary to lymphedema in both legs.  She has fibrotic skin changes however there is an ulcer that is very shallow and weeping on her posterior right calf.  She denies any chest pain or shortness of breath.  Her blood pressure is excellent. Lab on 10/30/2023  Component Date Value Ref Range Status   WBC 10/30/2023 8.3  3.8 - 10.8 Thousand/uL Final   RBC 10/30/2023 4.44  3.80 - 5.10 Million/uL Final   Hemoglobin 10/30/2023 13.2  11.7 - 15.5 g/dL Final   HCT 06/29/2535 40.2  35.0 - 45.0 % Final   MCV 10/30/2023 90.5  80.0 - 100.0 fL Final   MCH 10/30/2023 29.7  27.0 - 33.0 pg Final   MCHC 10/30/2023 32.8  32.0 - 36.0 g/dL Final   Comment: For adults, a slight decrease in the calculated MCHC value (in the range of 30 to 32 g/dL) is most likely not clinically significant; however, it should be interpreted with caution in correlation with other red cell parameters and the patient's clinical condition.    RDW 10/30/2023 13.3  11.0 - 15.0 % Final   Platelets 10/30/2023 112 (L)  140 - 400 Thousand/uL Final   MPV 10/30/2023 10.0  7.5 - 12.5 fL Final   Neutro Abs 10/30/2023 5,578  1,500 - 7,800 cells/uL Final   Absolute Lymphocytes 10/30/2023 2,150  850 - 3,900 cells/uL Final   Absolute Monocytes 10/30/2023 374  200 - 950 cells/uL Final   Eosinophils Absolute 10/30/2023 141  15 - 500 cells/uL Final   Basophils Absolute 10/30/2023 58  0 - 200 cells/uL Final   Neutrophils Relative % 10/30/2023 67.2  % Final   Total Lymphocyte 10/30/2023 25.9  % Final   Monocytes Relative 10/30/2023 4.5  % Final   Eosinophils Relative 10/30/2023 1.7  % Final   Basophils Relative  10/30/2023 0.7  % Final   Smear Review 10/30/2023    Final   Comment: Review of peripheral smear confirms automated results. Platelet clumps noted on smear-count appears adequate.    Glucose, Bld 10/30/2023 163 (H)  65 - 99 mg/dL Final   Comment: .            Fasting reference interval . For someone without known diabetes, a glucose value >125 mg/dL indicates that they may have diabetes and this should be confirmed with a follow-up test. .    BUN 10/30/2023 14  7 - 25 mg/dL Final   Creat 64/40/3474 0.89  0.50 - 1.05 mg/dL Final   eGFR 25/95/6387 71  > OR = 60 mL/min/1.49m2 Final   BUN/Creatinine Ratio 10/30/2023 SEE NOTE:  6 - 22 (calc) Final   Comment:    Not Reported: BUN and Creatinine are within    reference range. .    Sodium 10/30/2023 141  135 - 146 mmol/L Final   Potassium 10/30/2023 4.5  3.5 - 5.3 mmol/L Final   Chloride 10/30/2023 100  98 - 110 mmol/L Final   CO2 10/30/2023 31  20 - 32 mmol/L Final   Calcium 10/30/2023 9.3  8.6 - 10.4 mg/dL Final  Total Protein 10/30/2023 6.5  6.1 - 8.1 g/dL Final   Albumin 16/06/9603 4.1  3.6 - 5.1 g/dL Final   Globulin 54/05/8118 2.4  1.9 - 3.7 g/dL (calc) Final   AG Ratio 10/30/2023 1.7  1.0 - 2.5 (calc) Final   Total Bilirubin 10/30/2023 0.6  0.2 - 1.2 mg/dL Final   Alkaline phosphatase (APISO) 10/30/2023 64  37 - 153 U/L Final   AST 10/30/2023 15  10 - 35 U/L Final   ALT 10/30/2023 16  6 - 29 U/L Final   Hgb A1c MFr Bld 10/30/2023 8.0 (H)  <5.7 % of total Hgb Final   Comment: For someone without known diabetes, a hemoglobin A1c value of 6.5% or greater indicates that they may have  diabetes and this should be confirmed with a follow-up  test. . For someone with known diabetes, a value <7% indicates  that their diabetes is well controlled and a value  greater than or equal to 7% indicates suboptimal  control. A1c targets should be individualized based on  duration of diabetes, age, comorbid conditions, and  other  considerations. . Currently, no consensus exists regarding use of hemoglobin A1c for diagnosis of diabetes for children. .    Mean Plasma Glucose 10/30/2023 183  mg/dL Final   eAG (mmol/L) 14/78/2956 10.1  mmol/L Final   Cholesterol 10/30/2023 141  <200 mg/dL Final   HDL 21/30/8657 32 (L)  > OR = 50 mg/dL Final   Triglycerides 84/69/6295 142  <150 mg/dL Final   LDL Cholesterol (Calc) 10/30/2023 85  mg/dL (calc) Final   Comment: Reference range: <100 . Desirable range <100 mg/dL for primary prevention;   <70 mg/dL for patients with CHD or diabetic patients  with > or = 2 CHD risk factors. Marland Kitchen LDL-C is now calculated using the Martin-Hopkins  calculation, which is a validated novel method providing  better accuracy than the Friedewald equation in the  estimation of LDL-C.  Horald Pollen et al. Lenox Ahr. 2841;324(40): 2061-2068  (http://education.[facility].com/faq/FAQ164)    Total CHOL/HDL Ratio 10/30/2023 4.4  <1.0 (calc) Final   Non-HDL Cholesterol (Calc) 10/30/2023 109  <130 mg/dL (calc) Final   Comment: For patients with diabetes plus 1 major ASCVD risk  factor, treating to a non-HDL-C goal of <100 mg/dL  (LDL-C of <27 mg/dL) is considered a therapeutic  option.     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    Peripheral vascular disease (HCC)    Shortness of breath dyspnea    on lopressor    Sleep apnea    pt had study done but it was incomplete due to blood sugar issues    Past Surgical History:  Procedure Laterality Date   BIOPSY  04/28/2018   Procedure: BIOPSY;  Surgeon: Meryl Dare, MD;  Location: [facility];  Service: Endoscopy;;   BIOPSY THYROID     january 2016    ESOPHAGOGASTRODUODENOSCOPY (EGD) WITH PROPOFOL N/A 04/28/2018   Procedure: ESOPHAGOGASTRODUODENOSCOPY (EGD) WITH PROPOFOL;  Surgeon: Meryl Dare, MD;  Location: [facility];  Service: Endoscopy;  Laterality: N/A;   HYSTEROSCOPY WITH D & C  N/A 02/15/2015   Procedure: DILATATION AND CURETTAGE /HYSTEROSCOPY;  Surgeon: Mitchel Honour, DO;  Location: [facility];  Service: Gynecology;  Laterality: N/A;   TONSILLECTOMY     Current Outpatient Medications on File Prior to Visit  Medication Sig Dispense Refill   ACCU-CHEK GUIDE TEST test strip TEST BLOOD SUGAR THREE TIMES DAILY 300 strip 3   Accu-Chek Softclix Lancets lancets TEST BLOOD SUGAR THREE TIMES DAILY 300 each 0   Alcohol Swabs (DROPSAFE ALCOHOL PREP) 70 % PADS USE AS DIRECTED THREE TIMES DAILY 300 each 3   aspirin EC 81 MG tablet Take 81 mg by mouth daily.     Blood Glucose Calibration (TRUE METRIX LEVEL 2) Normal SOLN Use as directed to monitor FSBS 3x daily. Dx: E11.65 1 each 1   Blood Glucose Monitoring Suppl DEVI 1 each by Does not apply route in the morning, at noon, and at bedtime. May substitute to any manufacturer covered by patient's insurance. 1 each 0   cholecalciferol (VITAMIN D3) 25 MCG (1000 UNIT) tablet Take 1,000 Units by mouth daily.     cyanocobalamin (VITAMIN B12) 1000 MCG tablet Take 1,000 mcg by mouth daily.     Dulaglutide (TRULICITY) 3 MG/0.5ML SOPN Inject 3 mg as directed once a week. 6 mL 3   furosemide (LASIX) 20 MG tablet TAKE 1 TABLET EVERY DAY 90 tablet 3   gabapentin (NEURONTIN) 300 MG capsule TAKE 1 CAPSULE THREE TIMES DAILY 270 capsule 3   metFORMIN (GLUCOPHAGE) 1000 MG tablet TAKE 1 TABLET TWICE DAILY WITH MEALS 180 tablet 1   metoprolol tartrate (LOPRESSOR) 50 MG tablet Take 0.5 tablets (25 mg total) by mouth 2 (two) times daily. PLEASE CALL [phone] TO SCHEDULE YEARLY APPOINTMENT PRIOR TO NEXT REFILL REQUEST. THANK YOU. 30 tablet 1   Multiple Vitamin (MULTIVITAMIN) tablet Take 1 tablet by mouth daily.      pantoprazole (PROTONIX) 40 MG tablet TAKE 1 TABLET TWICE DAILY BEFORE MEALS 180 tablet 3   potassium chloride (KLOR-CON M) 10 MEQ tablet TAKE 1 TABLET EVERY DAY (NEED MD APPOINTMENT) 90 tablet 0   pravastatin (PRAVACHOL) 40 MG tablet TAKE 1 TABLET EVERY DAY 90 tablet 2   traMADol (ULTRAM) 50 MG tablet TAKE 1 TABLET BY MOUTH EVERY 6 HOURS AS NEEDED FOR  CHRONIC  BACK  PAIN 60 tablet 0   valACYclovir (VALTREX) 1000 MG tablet Take 1 tablet (1,000 mg total) by mouth 3 (three) times daily. 21 tablet 0   FARXIGA 10 MG TABS tablet TAKE 1 TABLET EVERY DAY BEFORE BREAKFAST (Patient not taking: Reported on 11/03/2023) 90 tablet 3   ondansetron (ZOFRAN) 4 MG tablet TAKE 1 TABLET (4 MG TOTAL) BY MOUTH EVERY 8 (EIGHT) HOURS AS NEEDED FOR NAUSEA OR VOMITING. (Patient not taking: Reported on 11/03/2023) 60 tablet 3   No current facility-administered medications on file prior to visit.   Allergies  Allergen Reactions   Codeine Anaphylaxis and Hives   Penicillins Anaphylaxis, Hives and Other (See Comments)    Has patient had a PCN reaction causing immediate rash, facial/tongue/throat swelling, SOB or lightheadedness with hypotension: No Has patient had a PCN reaction causing severe rash involving mucus membranes or skin necrosis: No Has patient had a PCN reaction that required hospitalization: No Has patient had a PCN reaction occurring within the last 10 years: No If all of the above answers are "NO", then may proceed with Cephalosporin use.    Cayenne Swelling    Lips swelled   Ciprofloxacin Other (See Comments)  Tongue Swelling   Food Nausea And Vomiting and Other (See Comments)    Magnus Ivan [Sitagliptin] Other (See Comments)    Stomach cramps   Latex Hives   Social History   Socioeconomic History   Marital status: Single    Spouse name: Not on file   Number of children: 0   Years of education: Not on file   Highest education level: Not on file  Occupational History   Occupation:  retired  Tobacco Use   Smoking status: Never   Smokeless tobacco: Never  Vaping Use   Vaping status: Never Used  Substance and Sexual Activity   Alcohol use: No    Comment: social drinker.   Drug use: No   Sexual activity: Not Currently  Other Topics Concern   Not on file  Social History Narrative   Lives alone. 1 brother living.    Social Drivers of Corporate investment banker Strain: Low Risk  (08/14/2022)   Overall Financial Resource Strain (CARDIA)    Difficulty of Paying Living Expenses: Not hard at all  Food Insecurity: No Food Insecurity (08/14/2022)   Hunger Vital Sign    Worried About Running Out of Food in the Last Year: Never true    Ran Out of Food in the Last Year: Never true  Transportation Needs: No Transportation Needs (08/14/2022)   PRAPARE - Administrator, Civil Service (Medical): No    Lack of Transportation (Non-Medical): No  Physical Activity: Insufficiently Active (08/14/2022)   Exercise Vital Sign    Days of Exercise per Week: 3 days    Minutes of Exercise per Session: 30 min  Stress: No Stress Concern Present (08/14/2022)   [employer] of Occupational Health - Occupational Stress Questionnaire    Feeling of Stress : Not at all  Social Connections: Moderately Integrated (08/14/2022)   Social Connection and Isolation Panel [NHANES]    Frequency of Communication with Friends and Family: More than three times a week    Frequency of Social Gatherings with Friends and Family: Three times a week    Attends Religious Services: 1 to 4 times per year    Active Member of Clubs or Organizations: Yes    Attends Banker Meetings: More than 4 times per year    Marital Status: Never married  Intimate Partner Violence: Not At Risk (08/14/2022)   Humiliation, Afraid, Rape, and Kick questionnaire    Fear of Current or Ex-Partner: No    Emotionally Abused: No    Physically Abused: No    Sexually Abused: No   Family History   Problem Relation Age of Onset   Hyperlipidemia Mother    Heart disease Father    Hyperlipidemia Father    Hypertension Father    Kidney disease Sister    Kidney cancer Sister    Ovarian cancer Maternal Aunt    Kidney disease Maternal Aunt        x 2   Kidney disease Maternal Uncle        x 2   Heart disease Brother    Stroke Brother    Heart disease Brother    Colon cancer Neg Hx    Stomach cancer Neg Hx    Breast cancer Neg Hx       Review of Systems  All other systems reviewed and are negative.      Objective:   Physical Exam Vitals reviewed.  Constitutional:  General: She is not in acute distress.    Appearance: She is well-developed. She is not diaphoretic.  HENT:     Head: Normocephalic and atraumatic.     Right Ear: External ear normal.     Left Ear: External ear normal.     Nose: Nose normal.     Mouth/Throat:     Pharynx: No oropharyngeal exudate.  Eyes:     General: No scleral icterus.       Right eye: No discharge.        Left eye: No discharge.     Conjunctiva/sclera: Conjunctivae normal.     Pupils: Pupils are equal, round, and reactive to light.  Neck:     Thyroid: No thyromegaly.     Vascular: No JVD.     Trachea: No tracheal deviation.  Cardiovascular:     Rate and Rhythm: Normal rate and regular rhythm.     Heart sounds: Normal heart sounds. No murmur heard.    No friction rub. No gallop.  Pulmonary:     Effort: Pulmonary effort is normal. No respiratory distress.     Breath sounds: Normal breath sounds. No stridor. No wheezing or rales.  Chest:     Chest wall: No tenderness.  Abdominal:     General: Bowel sounds are normal. There is no distension.     Palpations: Abdomen is soft. There is no mass.     Tenderness: There is no abdominal tenderness. There is no guarding or rebound.  Musculoskeletal:        General: No tenderness. Normal range of motion.     Cervical back: Normal range of motion and neck supple.     Right lower leg:  Edema present.     Left lower leg: Edema present.  Feet:     Left foot:     Skin integrity: Ulcer present.  Lymphadenopathy:     Cervical: No cervical adenopathy.  Skin:    General: Skin is warm.     Coloration: Skin is not pale.     Findings: No erythema or rash.  Neurological:     Mental Status: She is alert and oriented to person, place, and time.     Cranial Nerves: No cranial nerve deficit.     Motor: No abnormal muscle tone.     Coordination: Coordination normal.     Deep Tendon Reflexes: Reflexes are normal and symmetric.  Psychiatric:        Behavior: Behavior normal.        Thought Content: Thought content normal.        Judgment: Judgment normal.         Assessment & Plan:   Type 2 diabetes mellitus with complications (HCC)  NASH (nonalcoholic steatohepatitis)  Lymphedema I am very happy with her blood pressure, cholesterol, and kidney function.  Hemoglobin A1c is poorly controlled due to discontinuation of [place] and Trulicity.  Patient is unable to tolerate Actos due to swelling in the legs edema.  Therefore we have elected to use glipizide extended release 10 mg a day and rechecking in 3 months.  Strongly recommended the patient wear compression wraps for lymphedema to help prevent weeping sores on the legs.  We discussed strategies for compression therapy.  Patient does not wear her leg pumps 2 hours a day, orr her compression hose or socks due to neuropathy

## 2023-11-12 ENCOUNTER — Encounter: Payer: Self-pay | Admitting: Podiatry

## 2023-11-12 ENCOUNTER — Ambulatory Visit: Payer: Medicare HMO | Admitting: Podiatry

## 2023-11-12 DIAGNOSIS — L97522 Non-pressure chronic ulcer of other part of left foot with fat layer exposed: Secondary | ICD-10-CM | POA: Diagnosis not present

## 2023-11-13 NOTE — Progress Notes (Signed)
  Subjective:  Patient ID: Linda Briggs, female    DOB: 05/18/1956,  MRN: [id number]  Chief Complaint  Patient presents with   Diabetic Ulcer    "It's doing fairly good.  It may not be as good as it was the last time.  I've noticed it has started to drain a little more.  I'm wondering if he'll give me more of that collagen that he used to give me."    68 y.o. female presents with the above complaint. History confirmed with patient.  Has worsened some her A1c has been elevated, has not been able to pump as much as she would like to as well  Objective:  Physical Exam: warm, good capillary refill, normal DP and PT pulses, and diffuse neuropathy, full-thickness ulceration on medial left first MPJ measuring 1.0 x 0.4 x 0.2 cm with exposed subcutaneous tissue, surrounding macerated hyperkeratosis, serous drainage no purulent signs infection erythema or cellulitis.  No exposed bone tendon or joint.               ABIs without significant atherosclerosis   Assessment:   1. Ulcer of left foot with fat layer exposed (HCC)        Plan:  Patient was evaluated and treated and all questions answered.  Ulcer left foot -We discussed the etiology and factors that are a part of the wound healing process.  We also discussed the risk of infection both soft tissue and osteomyelitis from open ulceration.  Discussed the risk of limb loss if this happens or worsens. -Debridement as below. -Dressed with Prisma collagen matrix.  Continue this at home as well -Continue lymph pump use, she previously improved quite a bit while using these consistently.  I think wound closure and prevention of recurrence is going to rely on control of her lymphedema -Glycemic control encouraged and hopefully should improve wound healing   Procedure: Excisional Debridement of Wound Rationale: Removal of non-viable soft tissue from the wound to promote healing.  Anesthesia: none Post-Debridement Wound  Measurements: Noted above Type of Debridement: Sharp Excisional Tissue Removed: Non-viable soft tissue Depth of Debridement: subcutaneous tissue. Technique: Sharp excisional debridement to bleeding, viable wound base.  Dressing: Dry, sterile, compression dressing. Disposition: Patient tolerated procedure well.     Return in about 3 weeks (around 12/03/2023) for wound care.

## 2023-11-21 ENCOUNTER — Other Ambulatory Visit: Payer: Self-pay | Admitting: Family Medicine

## 2023-11-21 NOTE — Telephone Encounter (Signed)
 Last OV 11/03/23 Requested Prescriptions  Pending Prescriptions Disp Refills   metFORMIN (GLUCOPHAGE) 1000 MG tablet [Pharmacy Med Name: metFORMIN HCl Oral Tablet 1000 MG] 180 tablet 3    Sig: TAKE 1 TABLET TWICE DAILY WITH MEALS     Endocrinology:  Diabetes - Biguanides Failed - 11/21/2023  4:31 PM      Failed - HBA1C is between 0 and 7.9 and within 180 days    Hgb A1c MFr Bld  Date Value Ref Range Status  10/30/2023 8.0 (H) <5.7 % of total Hgb Final    Comment:    For someone without known diabetes, a hemoglobin A1c value of 6.5% or greater indicates that they may have  diabetes and this should be confirmed with a follow-up  test. . For someone with known diabetes, a value <7% indicates  that their diabetes is well controlled and a value  greater than or equal to 7% indicates suboptimal  control. A1c targets should be individualized based on  duration of diabetes, age, comorbid conditions, and  other considerations. . Currently, no consensus exists regarding use of hemoglobin A1c for diagnosis of diabetes for children. .          Failed - Valid encounter within last 6 months    Recent Outpatient Visits           2 years ago Type 2 diabetes mellitus with complications (HCC)   [facility] Pickard, Priscille Heidelberg, MD   2 years ago Fever, unspecified fever cause   [facility] Valentino Nose, NP   2 years ago Type 2 diabetes mellitus with complications (HCC)   [facility] Donita Brooks, MD   3 years ago Type 2 diabetes mellitus with complications (HCC)   [facility] Donita Brooks, MD   3 years ago Type 2 diabetes mellitus with complications (HCC)   [facility] Pickard, Priscille Heidelberg, MD              Passed - Cr in normal range and within 360 days    Creat  Date Value Ref Range Status  10/30/2023 0.89 0.50 - 1.05 mg/dL Final   Creatinine, Urine  Date Value Ref Range Status   04/23/2023 99 20 - 275 mg/dL Final         Passed - eGFR in normal range and within 360 days    GFR, Est African American  Date Value Ref Range Status  09/25/2020 69 > OR = 60 mL/min/1.30m2 Final   GFR, Est Non African American  Date Value Ref Range Status  09/25/2020 59 (L) > OR = 60 mL/min/1.57m2 Final   eGFR  Date Value Ref Range Status  10/30/2023 71 > OR = 60 mL/min/1.44m2 Final         Passed - B12 Level in normal range and within 720 days    Vitamin B-12  Date Value Ref Range Status  09/27/2022 247 200 - 1,100 pg/mL Final    Comment:    . Please Note: Although the reference range for vitamin B12 is [phone] pg/mL, it has been reported that between 5 and 10% of patients with values between 200 and 400 pg/mL may experience neuropsychiatric and hematologic abnormalities due to occult B12 deficiency; less than 1% of patients with values above 400 pg/mL will have symptoms. .          Passed - CBC within normal limits and completed in the last 12 months  WBC  Date Value Ref Range Status  10/30/2023 8.3 3.8 - 10.8 Thousand/uL Final   RBC  Date Value Ref Range Status  10/30/2023 4.44 3.80 - 5.10 Million/uL Final   Hemoglobin  Date Value Ref Range Status  10/30/2023 13.2 11.7 - 15.5 g/dL Final   HCT  Date Value Ref Range Status  10/30/2023 40.2 35.0 - 45.0 % Final   MCHC  Date Value Ref Range Status  10/30/2023 32.8 32.0 - 36.0 g/dL Final    Comment:    For adults, a slight decrease in the calculated MCHC value (in the range of 30 to 32 g/dL) is most likely not clinically significant; however, it should be interpreted with caution in correlation with other red cell parameters and the patient's clinical condition.    [facility]  Date Value Ref Range Status  10/30/2023 29.7 27.0 - 33.0 pg Final   MCV  Date Value Ref Range Status  10/30/2023 90.5 80.0 - 100.0 fL Final   No results found for: "PLTCOUNTKUC", "LABPLAT", "POCPLA" RDW  Date Value Ref Range  Status  10/30/2023 13.3 11.0 - 15.0 % Final

## 2023-11-30 ENCOUNTER — Other Ambulatory Visit: Payer: Self-pay | Admitting: Cardiovascular Disease

## 2023-12-03 ENCOUNTER — Ambulatory Visit: Admitting: Podiatry

## 2023-12-03 ENCOUNTER — Encounter: Payer: Self-pay | Admitting: Podiatry

## 2023-12-03 DIAGNOSIS — L97522 Non-pressure chronic ulcer of other part of left foot with fat layer exposed: Secondary | ICD-10-CM

## 2023-12-03 NOTE — Progress Notes (Signed)
  Subjective:  Patient ID: Linda Briggs, female    DOB: 02-May-1956,  MRN: [id number]  Chief Complaint  Patient presents with   Diabetic Ulcer    "It's doing better.  I think he's going to be pleased."    68 y.o. female presents with the above complaint. History confirmed with patient.  She has used problems more frequently now  Objective:  Physical Exam: warm, good capillary refill, normal DP and PT pulses, and diffuse neuropathy, full-thickness ulceration on medial left first MPJ measuring 0 point x 0.2 x 0.2 cm with exposed subcutaneous tissue, surrounding macerated hyperkeratosis, serous drainage no purulent signs infection erythema or cellulitis.  No exposed bone tendon or joint.        ABIs without significant atherosclerosis   Assessment:   1. Ulcer of left foot with fat layer exposed (HCC)         Plan:  Patient was evaluated and treated and all questions answered.  Ulcer left foot -We discussed the etiology and factors that are a part of the wound healing process.  We also discussed the risk of infection both soft tissue and osteomyelitis from open ulceration.  Discussed the risk of limb loss if this happens or worsens. -Debridement as below. -Dressed with Prisma collagen matrix.  Continue this at home as well -Continue lymph pump use, she previously improved quite a bit while using these consistently.  I think wound closure and prevention of recurrence is going to rely on control of her lymphedema -Glycemic control encouraged and hopefully should improve wound healing   Procedure: Excisional Debridement of Wound Rationale: Removal of non-viable soft tissue from the wound to promote healing.  Anesthesia: none Post-Debridement Wound Measurements: Noted above Type of Debridement: Sharp Excisional Tissue Removed: Non-viable soft tissue Depth of Debridement: subcutaneous tissue. Technique: Sharp excisional debridement to bleeding, viable wound base.  Dressing:  Dry, sterile, compression dressing. Disposition: Patient tolerated procedure well.     Return in about 3 weeks (around 12/24/2023) for wound care.

## 2023-12-21 ENCOUNTER — Other Ambulatory Visit: Payer: Self-pay | Admitting: Family Medicine

## 2023-12-22 NOTE — Telephone Encounter (Signed)
  Requested Prescriptions  Pending Prescriptions Disp Refills   potassium chloride  (KLOR-CON  M) 10 MEQ tablet [Pharmacy Med Name: Potassium Chloride  Crys ER Oral Tablet Extended Release 10 MEQ] 90 tablet 3    Sig: TAKE 1 TABLET EVERY DAY (NEED MD APPOINTMENT)     Endocrinology:  Minerals - Potassium Supplementation Passed - 12/22/2023  3:08 PM      Passed - K in normal range and within 360 days    Potassium  Date Value Ref Range Status  10/30/2023 4.5 3.5 - 5.3 mmol/L Final         Passed - Cr in normal range and within 360 days    Creat  Date Value Ref Range Status  10/30/2023 0.89 0.50 - 1.05 mg/dL Final   Creatinine, Urine  Date Value Ref Range Status  04/23/2023 99 20 - 275 mg/dL Final         Passed - Valid encounter within last 12 months    Recent Outpatient Visits           1 month ago Type 2 diabetes mellitus with complications [facility])   [place] [facility] Austine Lefort, MD   7 months ago Type 2 diabetes mellitus with complications [facility])   [place] [facility] Pickard, Cisco Crest, MD   1 year ago NASH (nonalcoholic steatohepatitis)   [place]  [facility] Pickard, Cisco Crest, MD   1 year ago Left sided sciatica    [facility] Family Medicine Pickard, Cisco Crest, MD

## 2023-12-24 ENCOUNTER — Ambulatory Visit: Admitting: Podiatry

## 2023-12-24 ENCOUNTER — Encounter: Payer: Self-pay | Admitting: Podiatry

## 2023-12-24 ENCOUNTER — Other Ambulatory Visit: Payer: Self-pay | Admitting: Family Medicine

## 2023-12-24 VITALS — Ht 66.0 in | Wt 284.6 lb

## 2023-12-24 DIAGNOSIS — S90811A Abrasion, right foot, initial encounter: Secondary | ICD-10-CM | POA: Diagnosis not present

## 2023-12-24 DIAGNOSIS — L97522 Non-pressure chronic ulcer of other part of left foot with fat layer exposed: Secondary | ICD-10-CM | POA: Diagnosis not present

## 2023-12-24 NOTE — Progress Notes (Signed)
  Subjective:  Patient ID: Linda Briggs, female    DOB: 11-20-1955,  MRN: [id number]  Chief Complaint  Patient presents with   Wound Check    Pt is here for wound care to left foot, states wound is healing well, also pt had a fall and scrapped her right foot and wants that to be checked out as well.    68 y.o. female presents with the above complaint. History confirmed with patient.  Is consistent with her bones although has not been last week since she injured it  Objective:  Physical Exam: warm, good capillary refill, normal DP and PT pulses, and diffuse neuropathy, full-thickness ulceration on medial left first MPJ measuring 0.4 x 0.2 x 0.2 cm with exposed subcutaneous tissue, surrounding macerated hyperkeratosis, serous drainage no purulent signs infection erythema or cellulitis.  No exposed bone tendon or joint.  Right foot has partial-thickness abrasions on the right great toe and dorsal 2nd and 3rd toes         ABIs without significant atherosclerosis   Assessment:   1. Ulcer of left foot with fat layer exposed (HCC)   2. Abrasion, right foot, initial encounter         Plan:  Patient was evaluated and treated and all questions answered.  Ulcer left foot -We discussed the etiology and factors that are a part of the wound healing process.  We also discussed the risk of infection both soft tissue and osteomyelitis from open ulceration.  Discussed the risk of limb loss if this happens or worsens. -Debridement as below. -Dressed with Prisma collagen matrix.  Continue this at home as well -Continue lymph pump use -Glycemic control encouraged and hopefully should improve wound healing   Procedure: Excisional Debridement of Wound Rationale: Removal of non-viable soft tissue from the wound to promote healing.  Anesthesia: none Post-Debridement Wound Measurements: Noted above Type of Debridement: Sharp Excisional Tissue Removed: Non-viable soft tissue Depth of  Debridement: subcutaneous tissue. Technique: Sharp excisional debridement to bleeding, viable wound base.  Dressing: Dry, sterile, compression dressing. Disposition: Patient tolerated procedure well.   Abrasion of right foot should heal uneventfully advised her to use mupirocin  ointment I do not expect it will need significant bandaging as there is little to no drainage and there is only limited skin breakdown.  She will let me know if there is any redness swelling or signs of infection  Return in about 3 weeks (around 01/14/2024) for wound care.

## 2023-12-25 NOTE — Telephone Encounter (Signed)
 Requested Prescriptions  Pending Prescriptions Disp Refills   furosemide  (LASIX ) 20 MG tablet [Pharmacy Med Name: Furosemide  Oral Tablet 20 MG] 90 tablet 1    Sig: TAKE 1 TABLET EVERY DAY     Cardiovascular:  Diuretics - Loop Failed - 12/25/2023  9:40 AM      Failed - Mg Level in normal range and within 180 days    No results found for: "MG"       Passed - K in normal range and within 180 days    Potassium  Date Value Ref Range Status  10/30/2023 4.5 3.5 - 5.3 mmol/L Final         Passed - Ca in normal range and within 180 days    Calcium   Date Value Ref Range Status  10/30/2023 9.3 8.6 - 10.4 mg/dL Final         Passed - Na in normal range and within 180 days    Sodium  Date Value Ref Range Status  10/30/2023 141 135 - 146 mmol/L Final         Passed - Cr in normal range and within 180 days    Creat  Date Value Ref Range Status  10/30/2023 0.89 0.50 - 1.05 mg/dL Final   Creatinine, Urine  Date Value Ref Range Status  04/23/2023 99 20 - 275 mg/dL Final         Passed - Cl in normal range and within 180 days    Chloride  Date Value Ref Range Status  10/30/2023 100 98 - 110 mmol/L Final         Passed - Last BP in normal range    BP Readings from Last 1 Encounters:  11/03/23 124/72         Passed - Valid encounter within last 6 months    Recent Outpatient Visits           1 month ago Type 2 diabetes mellitus with complications [facility])   [place] [facility] Medicine Austine Lefort, MD   8 months ago Type 2 diabetes mellitus with complications [facility])   [place] [facility] Medicine Pickard, Cisco Crest, MD   1 year ago NASH (nonalcoholic steatohepatitis)   [place] [facility] Family Medicine Pickard, Cisco Crest, MD   1 year ago Left sided sciatica   [place] [facility] Family Medicine Pickard, Cisco Crest, MD               Accu-Chek Softclix Lancets lancets [Pharmacy Med Name: Accu-Chek Softclix Lancets Miscellaneous] 300 each 1     Sig: TEST BLOOD SUGAR THREE TIMES DAILY     Endocrinology: Diabetes - Testing Supplies Passed - 12/25/2023  9:40 AM      Passed - Valid encounter within last 12 months    Recent Outpatient Visits           1 month ago Type 2 diabetes mellitus with complications [facility])   [place] [facility] Medicine Austine Lefort, MD   8 months ago Type 2 diabetes mellitus with complications [facility])   [place] [facility] Family Medicine Pickard, Cisco Crest, MD   1 year ago NASH (nonalcoholic steatohepatitis)   [place] [facility] Family Medicine Pickard, Cisco Crest, MD   1 year ago Left sided sciatica   [place] [facility] Medicine Pickard, Cisco Crest, MD

## 2024-01-05 ENCOUNTER — Other Ambulatory Visit: Payer: Self-pay | Admitting: Family Medicine

## 2024-01-06 NOTE — Telephone Encounter (Signed)
 Rx 06/05/23 #90 2RF- too soon Requested Prescriptions  Pending Prescriptions Disp Refills   pravastatin  (PRAVACHOL ) 40 MG tablet [Pharmacy Med Name: Pravastatin  Sodium Oral Tablet 40 MG] 90 tablet 3    Sig: TAKE 1 TABLET EVERY DAY     Cardiovascular:  Antilipid - Statins Failed - 01/06/2024  3:49 PM      Failed - Lipid Panel in normal range within the last 12 months    Cholesterol  Date Value Ref Range Status  10/30/2023 141 <200 mg/dL Final   LDL Cholesterol (Calc)  Date Value Ref Range Status  10/30/2023 85 mg/dL (calc) Final    Comment:    Reference range: <100 . Desirable range <100 mg/dL for primary prevention;   <70 mg/dL for patients with CHD or diabetic patients  with > or = 2 CHD risk factors. Aaron Aas LDL-C is now calculated using the Martin-Hopkins  calculation, which is a validated novel method providing  better accuracy than the Friedewald equation in the  estimation of LDL-C.  Melinda Sprawls et al. Erroll Heard. 1610;960(45): 2061-2068  (http://education.[employer].com/faq/FAQ164)    HDL  Date Value Ref Range Status  10/30/2023 32 (L) > OR = 50 mg/dL Final   Triglycerides  Date Value Ref Range Status  10/30/2023 142 <150 mg/dL Final         Passed - Patient is not pregnant      Passed - Valid encounter within last 12 months    Recent Outpatient Visits           2 months ago Type 2 diabetes mellitus with complications [facility])   [place] [facility] Medicine Austine Lefort, MD   8 months ago Type 2 diabetes mellitus with complications [facility])   [place] [facility] Family Medicine Pickard, Cisco Crest, MD   1 year ago NASH (nonalcoholic steatohepatitis)   [place] [facility] Family Medicine Pickard, Cisco Crest, MD   1 year ago Left sided sciatica   [place] [facility] Family Medicine Pickard, Cisco Crest, MD

## 2024-01-14 ENCOUNTER — Ambulatory Visit: Admitting: Podiatry

## 2024-01-20 ENCOUNTER — Encounter (INDEPENDENT_AMBULATORY_CARE_PROVIDER_SITE_OTHER): Payer: Self-pay

## 2024-01-20 ENCOUNTER — Other Ambulatory Visit: Payer: Self-pay | Admitting: Family Medicine

## 2024-01-22 NOTE — Telephone Encounter (Signed)
 Requested Prescriptions  Pending Prescriptions Disp Refills   gabapentin  (NEURONTIN ) 300 MG capsule [Pharmacy Med Name: Gabapentin  Oral Capsule 300 MG] 270 capsule 3    Sig: TAKE 1 CAPSULE THREE TIMES DAILY     Neurology: Anticonvulsants - gabapentin  Passed - 01/22/2024  9:11 AM      Passed - Cr in normal range and within 360 days    Creat  Date Value Ref Range Status  10/30/2023 0.89 0.50 - 1.05 mg/dL Final   Creatinine, Urine  Date Value Ref Range Status  04/23/2023 99 20 - 275 mg/dL Final         Passed - Completed PHQ-2 or PHQ-9 in the last 360 days      Passed - Valid encounter within last 12 months    Recent Outpatient Visits           2 months ago Type 2 diabetes mellitus with complications [facility])   [place] [facility] Medicine Austine Lefort, MD   8 months ago Type 2 diabetes mellitus with complications [facility])   [place] [facility] Family Medicine Pickard, Cisco Crest, MD   1 year ago NASH (nonalcoholic steatohepatitis)   [place] [facility] Family Medicine Pickard, Cisco Crest, MD   1 year ago Left sided sciatica   [place] [facility] Family Medicine Pickard, Cisco Crest, MD

## 2024-02-02 DIAGNOSIS — H353132 Nonexudative age-related macular degeneration, bilateral, intermediate dry stage: Secondary | ICD-10-CM | POA: Diagnosis not present

## 2024-02-02 DIAGNOSIS — H40013 Open angle with borderline findings, low risk, bilateral: Secondary | ICD-10-CM | POA: Diagnosis not present

## 2024-02-02 DIAGNOSIS — E113393 Type 2 diabetes mellitus with moderate nonproliferative diabetic retinopathy without macular edema, bilateral: Secondary | ICD-10-CM | POA: Diagnosis not present

## 2024-02-02 DIAGNOSIS — D3131 Benign neoplasm of right choroid: Secondary | ICD-10-CM | POA: Diagnosis not present

## 2024-02-04 ENCOUNTER — Ambulatory Visit: Admitting: Podiatry

## 2024-02-11 ENCOUNTER — Encounter: Payer: Self-pay | Admitting: Podiatry

## 2024-02-11 ENCOUNTER — Ambulatory Visit: Admitting: Podiatry

## 2024-02-11 DIAGNOSIS — L97522 Non-pressure chronic ulcer of other part of left foot with fat layer exposed: Secondary | ICD-10-CM

## 2024-02-15 ENCOUNTER — Encounter: Payer: Self-pay | Admitting: Podiatry

## 2024-02-15 NOTE — Progress Notes (Signed)
  Subjective:  Patient ID: Linda Briggs, female    DOB: 02-Nov-1955,  MRN: [id number]  Chief Complaint  Patient presents with   Diabetic Ulcer    Pretty good, they been swelling more since we have had the heat.    68 y.o. female presents with the above complaint. History confirmed with patient.   Objective:  Physical Exam: warm, good capillary refill, normal DP and PT pulses, and diffuse neuropathy, full-thickness ulceration on medial left first MPJ measuring 0.4 x 0.1 x 0.2 cm with exposed subcutaneous tissue, surrounding macerated hyperkeratosis, serous drainage no purulent signs infection erythema or cellulitis.  No exposed bone tendon or joint.           ABIs without significant atherosclerosis   Assessment:   1. Ulcer of left foot with fat layer exposed (HCC)         Plan:  Patient was evaluated and treated and all questions answered.  Ulcer left foot -We discussed the etiology and factors that are a part of the wound healing process.  We also discussed the risk of infection both soft tissue and osteomyelitis from open ulceration.  Discussed the risk of limb loss if this happens or worsens. -Debridement as below. -Dressed with Iodosorb -Continue lymph pump use -Glycemic control encouraged and hopefully should improve wound healing   Procedure: Excisional Debridement of Wound Rationale: Removal of non-viable soft tissue from the wound to promote healing.  Anesthesia: none Post-Debridement Wound Measurements: Noted above Type of Debridement: Sharp Excisional Tissue Removed: Non-viable soft tissue Depth of Debridement: subcutaneous tissue. Technique: Sharp excisional debridement to bleeding, viable wound base.  Dressing: Dry, sterile, compression dressing. Disposition: Patient tolerated procedure well.      No follow-ups on file.

## 2024-03-10 ENCOUNTER — Ambulatory Visit: Admitting: Podiatry

## 2024-03-10 ENCOUNTER — Encounter: Payer: Self-pay | Admitting: Podiatry

## 2024-03-10 VITALS — Ht 66.0 in | Wt 284.6 lb

## 2024-03-10 DIAGNOSIS — L97521 Non-pressure chronic ulcer of other part of left foot limited to breakdown of skin: Secondary | ICD-10-CM

## 2024-03-10 NOTE — Progress Notes (Signed)
  Subjective:  Patient ID: Linda Briggs, female    DOB: 05/28/1956,  MRN: [id number]  Chief Complaint  Patient presents with   Wound Check    Pt is here to f/u on wound on the left foot she states its healing well.    68 y.o. female presents with the above complaint. History confirmed with patient.   Objective:  Physical Exam: warm, good capillary refill, normal DP and PT pulses, and diffuse neuropathy, partial-thickness ulceration left medial first MTP         ABIs without significant atherosclerosis   Assessment:   1. Ulcer of left foot, limited to breakdown of skin [facility])         Plan:  Patient was evaluated and treated and all questions answered.  Ulcer left foot - Has improved quite a bit.  She says it had callused over and this came off and it appears to be a little bit open again but on exam it does not appear to be a full-thickness ulceration now that is largely epithelialized.  She should continue using her lymphedema pumps.  She also had a question about her toenails which seem to get loose to run through a likely secondary effect of the lymphedema that is severe as well right now no nails requiring avulsion akin to infection or looseness.  Return as needed for this and return in 4 weeks to reevaluate the wound.     Return in about 4 weeks (around 04/07/2024) for wound care.

## 2024-03-16 ENCOUNTER — Encounter (INDEPENDENT_AMBULATORY_CARE_PROVIDER_SITE_OTHER): Payer: Self-pay | Admitting: Vascular Surgery

## 2024-03-16 ENCOUNTER — Ambulatory Visit (INDEPENDENT_AMBULATORY_CARE_PROVIDER_SITE_OTHER): Payer: Medicare HMO | Admitting: Vascular Surgery

## 2024-03-16 VITALS — BP 124/60 | HR 58 | Resp 18 | Wt 289.0 lb

## 2024-03-16 DIAGNOSIS — E118 Type 2 diabetes mellitus with unspecified complications: Secondary | ICD-10-CM

## 2024-03-16 DIAGNOSIS — I10 Essential (primary) hypertension: Secondary | ICD-10-CM

## 2024-03-16 DIAGNOSIS — I890 Lymphedema, not elsewhere classified: Secondary | ICD-10-CM | POA: Diagnosis not present

## 2024-03-16 DIAGNOSIS — E782 Mixed hyperlipidemia: Secondary | ICD-10-CM

## 2024-03-16 NOTE — Progress Notes (Signed)
 MRN : [id number]  Linda Briggs is a 68 y.o. (1955-12-24) female who presents with chief complaint of  Chief Complaint  Patient presents with   Follow-up    53yr follow up  .  History of Present Illness: Patient returns today in follow up of her leg swelling and skin changes.  She developed a small ulceration on the right lateral lower leg that just started a couple of weeks ago.  It was about the size of a quarter but it is now the size of a nickel at largest.  No erythema.  Serous drainage.  She has marked hyperpigmentation and stasis dermatitis changes present in both lower extremities.  She has had some issues using her lymphedema pump recently which has worsened her swelling.  She cannot tolerate compression socks due to her neuropathy.  Current Outpatient Medications  Medication Sig Dispense Refill   ACCU-CHEK GUIDE TEST test strip TEST BLOOD SUGAR THREE TIMES DAILY 300 strip 3   Accu-Chek Softclix Lancets lancets TEST BLOOD SUGAR THREE TIMES DAILY 300 each 1   Alcohol Swabs (DROPSAFE ALCOHOL PREP) 70 % PADS USE AS DIRECTED THREE TIMES DAILY 300 each 3   aspirin EC 81 MG tablet Take 81 mg by mouth daily.     Blood Glucose Calibration (TRUE METRIX LEVEL 2) Normal SOLN Use as directed to monitor FSBS 3x daily. Dx: E11.65 1 each 1   Blood Glucose Monitoring Suppl DEVI 1 each by Does not apply route in the morning, at noon, and at bedtime. May substitute to any manufacturer covered by patient's insurance. 1 each 0   cholecalciferol (VITAMIN D3) 25 MCG (1000 UNIT) tablet Take 1,000 Units by mouth daily.     cyanocobalamin  (VITAMIN B12) 1000 MCG tablet Take 1,000 mcg by mouth daily.     furosemide  (LASIX ) 20 MG tablet TAKE 1 TABLET EVERY DAY 90 tablet 1   gabapentin  (NEURONTIN ) 300 MG capsule TAKE 1 CAPSULE THREE TIMES DAILY 270 capsule 1   glipiZIDE  (GLIPIZIDE  XL) 10 MG 24 hr tablet Take 1 tablet (10 mg total) by mouth daily with breakfast. 90 tablet 3   metFORMIN  (GLUCOPHAGE ) 1000 MG  tablet TAKE 1 TABLET TWICE DAILY WITH MEALS 180 tablet 1   metoprolol  tartrate (LOPRESSOR ) 50 MG tablet TAKE 1/2 TABLET TWICE DAILY. (PLEASE CALL [phone] TO SCHEDULE YEARLY APPOINTMENT PRIOR TO NEXT REFILL REQUEST) 60 tablet 2   Multiple Vitamin (MULTIVITAMIN) tablet Take 1 tablet by mouth daily.     pantoprazole  (PROTONIX ) 40 MG tablet TAKE 1 TABLET TWICE DAILY BEFORE MEALS 180 tablet 3   potassium chloride  (KLOR-CON  M) 10 MEQ tablet TAKE 1 TABLET EVERY DAY (NEED MD APPOINTMENT) 90 tablet 3   pravastatin  (PRAVACHOL ) 40 MG tablet TAKE 1 TABLET EVERY DAY 90 tablet 2   traMADol  (ULTRAM ) 50 MG tablet TAKE 1 TABLET BY MOUTH EVERY 6 HOURS AS NEEDED FOR  CHRONIC  BACK  PAIN 60 tablet 0   Dulaglutide  (TRULICITY ) 3 MG/0.5ML SOPN Inject 3 mg as directed once a week. (Patient not taking: Reported on 03/16/2024) 6 mL 3   FARXIGA  10 MG TABS tablet TAKE 1 TABLET EVERY DAY BEFORE BREAKFAST (Patient not taking: Reported on 03/16/2024) 90 tablet 3   ondansetron  (ZOFRAN ) 4 MG tablet TAKE 1 TABLET (4 MG TOTAL) BY MOUTH EVERY 8 (EIGHT) HOURS AS NEEDED FOR NAUSEA OR VOMITING. (Patient not taking: Reported on 03/16/2024) 60 tablet 3   valACYclovir  (VALTREX ) 1000 MG tablet Take 1 tablet (1,000 mg total) by mouth 3 (  three) times daily. (Patient not taking: Reported on 03/16/2024) 21 tablet 0   No current facility-administered medications for this visit.    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     Peripheral vascular disease (HCC)    Shortness of breath dyspnea    on lopressor     Sleep apnea    pt had study done but it was incomplete due to blood sugar issues     Past Surgical History:  Procedure Laterality Date   BIOPSY   04/28/2018   Procedure: BIOPSY;  Surgeon: Aneita Gwendlyn DASEN, MD;  Location: [facility];  Service: Endoscopy;;   BIOPSY THYROID      january 2016   ESOPHAGOGASTRODUODENOSCOPY (EGD) WITH PROPOFOL  N/A 04/28/2018   Procedure: ESOPHAGOGASTRODUODENOSCOPY (EGD) WITH PROPOFOL ;  Surgeon: Aneita Gwendlyn DASEN, MD;  Location: [facility];  Service: Endoscopy;  Laterality: N/A;   HYSTEROSCOPY WITH D & C N/A 02/15/2015   Procedure: DILATATION AND CURETTAGE /HYSTEROSCOPY;  Surgeon: Duwaine Blumenthal, DO;  Location: [facility];  Service: Gynecology;  Laterality: N/A;   TONSILLECTOMY       Social History   Tobacco Use   Smoking status: Never   Smokeless tobacco: Never  Vaping Use   Vaping status: Never Used  Substance Use Topics   Alcohol use: No    Comment: social drinker.   Drug use: No      Family History  Problem Relation Age of Onset   Hyperlipidemia Mother    Heart disease Father    Hyperlipidemia Father    Hypertension Father    Kidney disease Sister    Kidney cancer Sister    Ovarian cancer Maternal Aunt    Kidney disease Maternal Aunt        x 2   Kidney disease Maternal Uncle        x 2   Heart disease Brother    Stroke Brother    Heart disease Brother    Colon cancer Neg Hx    Stomach cancer Neg Hx    Breast cancer Neg Hx      Allergies  Allergen Reactions   Codeine Anaphylaxis and Hives   Penicillins Anaphylaxis, Hives and Other (See Comments)    Has patient had a PCN reaction causing immediate rash, facial/tongue/throat swelling, SOB or lightheadedness with hypotension: No Has patient had a PCN reaction causing severe rash involving mucus membranes or skin necrosis: No Has patient had a PCN reaction that required hospitalization: No Has patient had a PCN reaction occurring within the last 10 years: No If all of the above answers are NO, then may proceed with Cephalosporin use.    Cayenne Swelling    Lips swelled   Ciprofloxacin  Other (See Comments)    Tongue Swelling    Food Nausea And Vomiting and Other (See Comments)    Olives    Januvia [Sitagliptin] Other (See Comments)    Stomach cramps   Latex Hives     REVIEW OF SYSTEMS (Negative unless checked)   Constitutional: [] Weight loss  [] Fever  [] Chills Cardiac: [] Chest pain   [] Chest pressure   [] Palpitations   [] Shortness  of breath when laying flat   [x] Shortness of breath at rest   [x] Shortness of breath with exertion. Vascular:  [] Pain in legs with walking   [] Pain in legs at rest   [] Pain in legs when laying flat   [] Claudication   [] Pain in feet when walking  [] Pain in feet at rest  [] Pain in feet when laying flat   [] History of DVT   [] Phlebitis   [x] Swelling in legs   [] Varicose veins   [] Non-healing ulcers Pulmonary:   [] Uses home oxygen   [] Productive cough   [] Hemoptysis   [] Wheeze  [] COPD   [] Asthma Neurologic:  [] Dizziness  [] Blackouts   [] Seizures   [] History of stroke   [] History of TIA  [] Aphasia   [] Temporary blindness   [] Dysphagia   [] Weakness or numbness in arms   [] Weakness or numbness in legs Musculoskeletal:  [x] Arthritis   [] Joint swelling   [] Joint pain   [x] Low back pain Hematologic:  [] Easy bruising  [] Easy bleeding   [] Hypercoagulable state   [] Anemic  [] Hepatitis Gastrointestinal:  [] Blood in stool   [] Vomiting blood  [] Gastroesophageal reflux/heartburn   [] Abdominal pain Genitourinary:  [] Chronic kidney disease   [] Difficult urination  [] Frequent urination  [] Burning with urination   [] Hematuria Skin:  [] Rashes   [] Ulcers   [] Wounds Psychological:  [] History of anxiety   []  History of major depression.  Physical Examination  BP 124/60   Pulse (!) 58   Resp 18   Wt 289 lb (131.1 kg)   BMI 46.65 kg/m  Gen:  WD/WN, NAD. obese  Head: [place]/AT, No temporalis wasting. Ear/Nose/Throat: Hearing grossly intact, nares w/o erythema or drainage Eyes: Conjunctiva clear. Sclera non-icteric Neck: Supple.  Trachea midline Pulmonary:  Good air movement, no use of accessory muscles.   Cardiac: bradycardic Vascular:  Vessel Right Left  Radial Palpable Palpable                          PT Not Palpable Not Palpable  DP Not Palpable Not Palpable   Gastrointestinal: soft, non-tender/non-distended. No guarding/reflex.  Musculoskeletal: M/S 5/5 throughout.  No deformity or atrophy.  Marked hyperpigmentation and stasis dermatitis changes present bilaterally.  Small nickel sized ulceration covered by bandage on the right lateral mid lower leg.  1-2+ bilateral lower extremity edema. Neurologic: Sensation grossly intact in extremities.  Symmetrical.  Speech is fluent.  Psychiatric: Judgment intact, Mood & affect appropriate for pt's clinical situation.       Labs No results found for this or any previous visit (from the past 2160 hours).  Radiology No results found.  Assessment/Plan  Lymphedema We discussed the importance of getting back to using her compression pump as frequently as possible.  We discussed the importance of elevation.  I would like to get her in some sort of compression garment I think that highly unlikely.  I have offered her Unna boots for the small ulceration associated with skin changes and swelling.  She does not want to have these placed.  She is familiar with them from a family member.  I told her if her wound does not heal on her own accord, I would strongly recommend that.  She will contact our office if this does not improve.  Hyperlipidemia lipid control important in reducing the progression of atherosclerotic disease. Continue statin therapy     Type 2 diabetes mellitus with complications blood glucose control important in reducing the progression of atherosclerotic disease. Also, involved in wound  healing. On appropriate medications.     Essential hypertension blood pressure control important in reducing the progression of atherosclerotic disease. On appropriate oral medications.  Selinda Gu, MD  03/16/2024 2:06 PM    This  note was created with Dragon medical transcription system.  Any errors from dictation are purely unintentional

## 2024-03-16 NOTE — Assessment & Plan Note (Addendum)
 We discussed the importance of getting back to using her compression pump as frequently as possible.  We discussed the importance of elevation.  I would like to get her in some sort of compression garment I think that highly unlikely.  I have offered her Unna boots for the small ulceration associated with skin changes and swelling.  She does not want to have these placed.  She is familiar with them from a family member.  I told her if her wound does not heal on her own accord, I would strongly recommend that.  She will contact our office if this does not improve. Otherwise, follow up in one year

## 2024-04-07 ENCOUNTER — Ambulatory Visit: Admitting: Podiatry

## 2024-04-07 VITALS — Ht 66.0 in | Wt 289.0 lb

## 2024-04-07 DIAGNOSIS — L97521 Non-pressure chronic ulcer of other part of left foot limited to breakdown of skin: Secondary | ICD-10-CM | POA: Diagnosis not present

## 2024-04-09 ENCOUNTER — Encounter: Payer: Self-pay | Admitting: Podiatry

## 2024-04-09 NOTE — Progress Notes (Signed)
  Subjective:  Patient ID: Linda Briggs, female    DOB: 02-09-1956,  MRN: [id number]  Chief Complaint  Patient presents with   Wound Check    RM 3 Patient is here for wound care of the left foot.     68 y.o. female presents with the above complaint. History confirmed with patient.   Objective:  Physical Exam: warm, good capillary refill, normal DP and PT pulses, and diffuse neuropathy, partial-thickness ulceration left medial first MTP     ABIs without significant atherosclerosis   Assessment:   1. Ulcer of left foot, limited to breakdown of skin [facility])          Plan:  Patient was evaluated and treated and all questions answered.  Ulcer left foot - Overall doing quite well.  Still partial-thickness.  Continue to utilize compression therapy and lymphedema reduction.  This is help with her wound healing.  Return in 5 weeks to reevaluate.  No debridement necessary today.    Return in about 5 weeks (around 05/12/2024) for wound care.

## 2024-04-15 ENCOUNTER — Other Ambulatory Visit: Payer: Self-pay | Admitting: Family Medicine

## 2024-04-25 ENCOUNTER — Other Ambulatory Visit: Payer: Self-pay | Admitting: Cardiovascular Disease

## 2024-04-27 NOTE — Telephone Encounter (Signed)
 Pt is scheduled on 06/29/24

## 2024-04-27 NOTE — Telephone Encounter (Signed)
Please contact pt for future appointment. Pt overdue for 12 month f/u. 

## 2024-05-05 ENCOUNTER — Other Ambulatory Visit

## 2024-05-05 DIAGNOSIS — E782 Mixed hyperlipidemia: Secondary | ICD-10-CM

## 2024-05-05 DIAGNOSIS — E118 Type 2 diabetes mellitus with unspecified complications: Secondary | ICD-10-CM | POA: Diagnosis not present

## 2024-05-05 DIAGNOSIS — I10 Essential (primary) hypertension: Secondary | ICD-10-CM

## 2024-05-06 ENCOUNTER — Ambulatory Visit: Payer: Self-pay | Admitting: Family Medicine

## 2024-05-06 LAB — COMPLETE METABOLIC PANEL WITHOUT GFR
AG Ratio: 1.7 (calc) (ref 1.0–2.5)
ALT: 14 U/L (ref 6–29)
AST: 13 U/L (ref 10–35)
Albumin: 4.1 g/dL (ref 3.6–5.1)
Alkaline phosphatase (APISO): 71 U/L (ref 37–153)
BUN: 11 mg/dL (ref 7–25)
CO2: 31 mmol/L (ref 20–32)
Calcium: 9.1 mg/dL (ref 8.6–10.4)
Chloride: 102 mmol/L (ref 98–110)
Creat: 0.86 mg/dL (ref 0.50–1.05)
Globulin: 2.4 g/dL (ref 1.9–3.7)
Glucose, Bld: 217 mg/dL — ABNORMAL HIGH (ref 65–99)
Potassium: 4.3 mmol/L (ref 3.5–5.3)
Sodium: 140 mmol/L (ref 135–146)
Total Bilirubin: 0.4 mg/dL (ref 0.2–1.2)
Total Protein: 6.5 g/dL (ref 6.1–8.1)

## 2024-05-06 LAB — CBC WITH DIFFERENTIAL/PLATELET
Absolute Lymphocytes: 2903 {cells}/uL (ref 850–3900)
Absolute Monocytes: 419 {cells}/uL (ref 200–950)
Basophils Absolute: 73 {cells}/uL (ref 0–200)
Basophils Relative: 0.8 %
Eosinophils Absolute: 173 {cells}/uL (ref 15–500)
Eosinophils Relative: 1.9 %
HCT: 37.6 % (ref 35.0–45.0)
Hemoglobin: 12.5 g/dL (ref 11.7–15.5)
MCH: 30 pg (ref 27.0–33.0)
MCHC: 33.2 g/dL (ref 32.0–36.0)
MCV: 90.2 fL (ref 80.0–100.0)
MPV: 10.3 fL (ref 7.5–12.5)
Monocytes Relative: 4.6 %
Neutro Abs: 5533 {cells}/uL (ref 1500–7800)
Neutrophils Relative %: 60.8 %
Platelets: 134 Thousand/uL — ABNORMAL LOW (ref 140–400)
RBC: 4.17 Million/uL (ref 3.80–5.10)
RDW: 13.5 % (ref 11.0–15.0)
Total Lymphocyte: 31.9 %
WBC: 9.1 Thousand/uL (ref 3.8–10.8)

## 2024-05-06 LAB — LIPID PANEL
Cholesterol: 166 mg/dL (ref <200)
HDL: 30 mg/dL — ABNORMAL LOW (ref >=50)
LDL Cholesterol (Calc): 109 mg/dL — ABNORMAL HIGH
Non-HDL Cholesterol (Calc): 136 mg/dL — ABNORMAL HIGH (ref <130)
Total CHOL/HDL Ratio: 5.5 (calc) — ABNORMAL HIGH (ref <5.0)
Triglycerides: 157 mg/dL — ABNORMAL HIGH (ref <150)

## 2024-05-06 LAB — HEMOGLOBIN A1C
Hgb A1c MFr Bld: 8.7 % — ABNORMAL HIGH (ref <5.7)
Mean Plasma Glucose: 203 mg/dL
eAG (mmol/L): 11.2 mmol/L

## 2024-05-08 ENCOUNTER — Other Ambulatory Visit: Payer: Self-pay | Admitting: Family Medicine

## 2024-05-10 ENCOUNTER — Encounter: Payer: Self-pay | Admitting: Family Medicine

## 2024-05-10 ENCOUNTER — Ambulatory Visit: Admitting: Family Medicine

## 2024-05-10 VITALS — BP 132/82 | HR 64 | Temp 98.0°F | Ht 66.0 in | Wt 298.2 lb

## 2024-05-10 DIAGNOSIS — Z0000 Encounter for general adult medical examination without abnormal findings: Secondary | ICD-10-CM

## 2024-05-10 DIAGNOSIS — K7581 Nonalcoholic steatohepatitis (NASH): Secondary | ICD-10-CM | POA: Diagnosis not present

## 2024-05-10 DIAGNOSIS — Z780 Asymptomatic menopausal state: Secondary | ICD-10-CM | POA: Diagnosis not present

## 2024-05-10 DIAGNOSIS — I890 Lymphedema, not elsewhere classified: Secondary | ICD-10-CM

## 2024-05-10 DIAGNOSIS — Z0001 Encounter for general adult medical examination with abnormal findings: Secondary | ICD-10-CM | POA: Diagnosis not present

## 2024-05-10 DIAGNOSIS — Z1211 Encounter for screening for malignant neoplasm of colon: Secondary | ICD-10-CM | POA: Diagnosis not present

## 2024-05-10 DIAGNOSIS — E118 Type 2 diabetes mellitus with unspecified complications: Secondary | ICD-10-CM | POA: Diagnosis not present

## 2024-05-10 MED ORDER — PIOGLITAZONE HCL 30 MG PO TABS: 30.0000 mg | ORAL_TABLET | Freq: Every day | ORAL | 3 refills | Status: DC

## 2024-05-10 NOTE — Progress Notes (Signed)
 Subjective:    Patient ID: Linda Briggs, female    DOB: 1955-12-27, 68 y.o.   MRN: [id number] Patient is here today for a complete physical exam.  With regards to her immunizations, she is due for the shingles vaccine.  She declines this because she is already had shingles.  She is due for a flu shot, COVID shot, and RSV vaccine.  She refuses all of these today.  She is due for a mammogram.  She wants to schedule this herself.  She is due for a bone density test.  She will allow me to schedule this for her.  She is also due for colon cancer screening.  She has never had a colonoscopy.  She is willing to schedule a Cologuard but she declines a colonoscopy.  Patient reports numbness and tingling in both hands distal to the wrist.  She reports decreasing grip strength in both hands.  She reports burning pain in all of her fingers.  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  She has a history of severe neuropathy in her feet.  She is unable to appreciate any sensation in her feet.  She cannot feel a 10 g monofilament in any of her toes.  She is concerned that the neuropathy is now spreading to her hands.  She denies any numbness or tingling or burning or radiating from her neck down her arm.  She does have an open venous stasis ulcer on her right lateral shin that is roughly the diameter of a nickel Lab on 05/05/2024  Component Date Value Ref Range Status   WBC 05/05/2024 9.1  3.8 - 10.8 Thousand/uL Final   RBC 05/05/2024 4.17  3.80 - 5.10 Million/uL Final   Hemoglobin 05/05/2024 12.5  11.7 - 15.5 g/dL Final   HCT 90/96/7974 37.6  35.0 - 45.0 % Final   MCV 05/05/2024 90.2  80.0 - 100.0 fL Final   MCH 05/05/2024 30.0  27.0 - 33.0 pg Final   MCHC 05/05/2024 33.2  32.0 - 36.0 g/dL Final   Comment: For adults, a slight decrease in the calculated MCHC value (in the range of 30 to 32 g/dL) is most likely not clinically significant; however, it should be interpreted with caution in correlation  with other red cell parameters and the patient's clinical condition.    RDW 05/05/2024 13.5  11.0 - 15.0 % Final   Platelets 05/05/2024 134 (L)  140 - 400 Thousand/uL Final   MPV 05/05/2024 10.3  7.5 - 12.5 fL Final   Neutro Abs 05/05/2024 5,533  1,500 - 7,800 cells/uL Final   Absolute Lymphocytes 05/05/2024 2,903  850 - 3,900 cells/uL Final   Absolute Monocytes 05/05/2024 419  200 - 950 cells/uL Final   Eosinophils Absolute 05/05/2024 173  15 - 500 cells/uL Final   Basophils Absolute 05/05/2024 73  0 - 200 cells/uL Final   Neutrophils Relative % 05/05/2024 60.8  % Final   Total Lymphocyte 05/05/2024 31.9  % Final   Monocytes Relative 05/05/2024 4.6  % Final   Eosinophils Relative 05/05/2024 1.9  % Final   Basophils Relative 05/05/2024 0.8  % Final   Glucose, Bld 05/05/2024 217 (H)  65 - 99 mg/dL Final   Comment: .            Fasting reference interval . For someone without known diabetes, a glucose value >125 mg/dL indicates that they may have diabetes and this should be confirmed with a follow-up test. .  BUN 05/05/2024 11  7 - 25 mg/dL Final   Creat 90/96/7974 0.86  0.50 - 1.05 mg/dL Final   BUN/Creatinine Ratio 05/05/2024 SEE NOTE:  6 - 22 (calc) Final   Comment:    Not Reported: BUN and Creatinine are within    reference range. .    Sodium 05/05/2024 140  135 - 146 mmol/L Final   Potassium 05/05/2024 4.3  3.5 - 5.3 mmol/L Final   Chloride 05/05/2024 102  98 - 110 mmol/L Final   CO2 05/05/2024 31  20 - 32 mmol/L Final   Calcium  05/05/2024 9.1  8.6 - 10.4 mg/dL Final   Total Protein 90/96/7974 6.5  6.1 - 8.1 g/dL Final   Albumin 90/96/7974 4.1  3.6 - 5.1 g/dL Final   Globulin 90/96/7974 2.4  1.9 - 3.7 g/dL (calc) Final   AG Ratio 05/05/2024 1.7  1.0 - 2.5 (calc) Final   Total Bilirubin 05/05/2024 0.4  0.2 - 1.2 mg/dL Final   Alkaline phosphatase (APISO) 05/05/2024 71  37 - 153 U/L Final   AST 05/05/2024 13  10 - 35 U/L Final   ALT 05/05/2024 14  6 - 29 U/L Final    Hgb A1c MFr Bld 05/05/2024 8.7 (H)  <5.7 % Final   Comment: For someone without known diabetes, a hemoglobin A1c value of 6.5% or greater indicates that they may have  diabetes and this should be confirmed with a follow-up  test. . For someone with known diabetes, a value <7% indicates  that their diabetes is well controlled and a value  greater than or equal to 7% indicates suboptimal  control. A1c targets should be individualized based on  duration of diabetes, age, comorbid conditions, and  other considerations. . Currently, no consensus exists regarding use of hemoglobin A1c for diagnosis of diabetes for children. .    Mean Plasma Glucose 05/05/2024 203  mg/dL Final   eAG (mmol/L) 90/96/7974 11.2  mmol/L Final   Cholesterol 05/05/2024 166  <200 mg/dL Final   HDL 90/96/7974 30 (L)  > OR = 50 mg/dL Final   Triglycerides 90/96/7974 157 (H)  <150 mg/dL Final   LDL Cholesterol (Calc) 05/05/2024 109 (H)  mg/dL (calc) Final   Comment: Reference range: <100 . Desirable range <100 mg/dL for primary prevention;   <70 mg/dL for patients with CHD or diabetic patients  with > or = 2 CHD risk factors. SABRA LDL-C is now calculated using the Martin-Hopkins  calculation, which is a validated novel method providing  better accuracy than the Friedewald equation in the  estimation of LDL-C.  Gladis APPLETHWAITE et al. SANDREA. 7986;689(80): 2061-2068  (http://education.[facility].com/faq/FAQ164)    Total CHOL/HDL Ratio 05/05/2024 5.5 (H)  <5.0 (calc) Final   Non-HDL Cholesterol (Calc) 05/05/2024 136 (H)  <130 mg/dL (calc) Final   Comment: For patients with diabetes plus 1 major ASCVD risk  factor, treating to a non-HDL-C goal of <100 mg/dL  (LDL-C of <29 mg/dL) is considered a therapeutic  option.     Past Medical History:  Diagnosis Date   Arthritis    feet & toes   Bulging disc    Diabetes mellitus    Gastroparesis    Hyperlipidemia    Hypertension    Neuromuscular disorder (HCC)    in  feet and fingers    Neuropathy    feet   Nonproliferative diabetic retinopathy (HCC)    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     Peripheral vascular disease (HCC)  Shortness of breath dyspnea    on lopressor     Sleep apnea    pt had study done but it was incomplete due to blood sugar issues    Past Surgical History:  Procedure Laterality Date   BIOPSY  04/28/2018   Procedure: BIOPSY;  Surgeon: Aneita Gwendlyn DASEN, MD;  Location: [facility];  Service: Endoscopy;;   BIOPSY THYROID      january 2016   ESOPHAGOGASTRODUODENOSCOPY (EGD) WITH PROPOFOL  N/A 04/28/2018   Procedure: ESOPHAGOGASTRODUODENOSCOPY (EGD) WITH PROPOFOL ;  Surgeon: Aneita Gwendlyn DASEN, MD;  Location: [facility];  Service: Endoscopy;  Laterality: N/A;   HYSTEROSCOPY WITH D & C N/A 02/15/2015   Procedure: DILATATION AND CURETTAGE /HYSTEROSCOPY;  Surgeon: Duwaine Blumenthal, DO;  Location: [facility];  Service: Gynecology;  Laterality: N/A;   TONSILLECTOMY     Current Outpatient Medications on File Prior to Visit  Medication Sig Dispense Refill   ACCU-CHEK GUIDE TEST test strip TEST BLOOD SUGAR THREE TIMES DAILY 300 strip 3   Accu-Chek Softclix Lancets lancets TEST BLOOD SUGAR THREE TIMES DAILY 300 each 1   Alcohol Swabs (DROPSAFE ALCOHOL PREP) 70 % PADS USE AS DIRECTED THREE TIMES DAILY 300 each 3   aspirin EC 81 MG tablet Take 81 mg by mouth daily.     Blood Glucose Calibration (TRUE METRIX LEVEL 2) Normal SOLN Use as directed to monitor FSBS 3x daily. Dx: E11.65 1 each 1   Blood Glucose Monitoring Suppl DEVI 1 each by Does not apply route in the morning, at noon, and at bedtime. May substitute to any manufacturer covered by patient's insurance. 1 each 0   cholecalciferol (VITAMIN D3) 25 MCG (1000 UNIT) tablet Take 1,000 Units by mouth daily.     cyanocobalamin  (VITAMIN B12) 1000 MCG tablet Take 1,000 mcg by mouth daily.     furosemide  (LASIX ) 20 MG tablet TAKE 1 TABLET EVERY DAY 90 tablet 1   gabapentin  (NEURONTIN ) 300 MG capsule  TAKE 1 CAPSULE THREE TIMES DAILY 270 capsule 1   glipiZIDE  (GLIPIZIDE  XL) 10 MG 24 hr tablet Take 1 tablet (10 mg total) by mouth daily with breakfast. 90 tablet 3   metFORMIN  (GLUCOPHAGE ) 1000 MG tablet TAKE 1 TABLET TWICE DAILY WITH MEALS 180 tablet 3   metoprolol  tartrate (LOPRESSOR ) 50 MG tablet Take 0.5 tablets (25 mg total) by mouth 2 (two) times daily. 30 tablet 0   Multiple Vitamin (MULTIVITAMIN) tablet Take 1 tablet by mouth daily.     ondansetron  (ZOFRAN ) 4 MG tablet TAKE 1 TABLET (4 MG TOTAL) BY MOUTH EVERY 8 (EIGHT) HOURS AS NEEDED FOR NAUSEA OR VOMITING. 60 tablet 3   pantoprazole  (PROTONIX ) 40 MG tablet TAKE 1 TABLET TWICE DAILY BEFORE MEALS 180 tablet 3   potassium chloride  (KLOR-CON  M) 10 MEQ tablet TAKE 1 TABLET EVERY DAY (NEED MD APPOINTMENT) 90 tablet 3   pravastatin  (PRAVACHOL ) 40 MG tablet TAKE 1 TABLET EVERY DAY 90 tablet 2   traMADol  (ULTRAM ) 50 MG tablet TAKE 1 TABLET BY MOUTH EVERY 6 HOURS AS NEEDED FOR  CHRONIC  BACK  PAIN 60 tablet 0   Dulaglutide  (TRULICITY ) 3 MG/0.5ML SOPN Inject 3 mg as directed once a week. (Patient not taking: Reported on 05/10/2024) 6 mL 3   FARXIGA  10 MG TABS tablet TAKE 1 TABLET EVERY DAY BEFORE BREAKFAST (Patient not taking: Reported on 05/10/2024) 90 tablet 3   valACYclovir  (VALTREX ) 1000 MG tablet Take 1 tablet (1,000 mg total) by mouth 3 (three) times daily. (Patient not taking: Reported on 05/10/2024)  21 tablet 0   No current facility-administered medications on file prior to visit.   Allergies  Allergen Reactions   Codeine Anaphylaxis and Hives   Penicillins Anaphylaxis, Hives and Other (See Comments)    Has patient had a PCN reaction causing immediate rash, facial/tongue/throat swelling, SOB or lightheadedness with hypotension: No Has patient had a PCN reaction causing severe rash involving mucus membranes or skin necrosis: No Has patient had a PCN reaction that required hospitalization: No Has patient had a PCN reaction occurring within the  last 10 years: No If all of the above answers are NO, then may proceed with Cephalosporin use.    Cayenne Swelling    Lips swelled   Ciprofloxacin  Other (See Comments)    Tongue Swelling   Food Nausea And Vomiting and Other (See Comments)    Olives    Januvia [Sitagliptin] Other (See Comments)    Stomach cramps   Latex Hives   Social History   Socioeconomic History   Marital status: Single    Spouse name: Not on file   Number of children: 0   Years of education: Not on file   Highest education level: Not on file  Occupational History   Occupation: retired  Tobacco Use   Smoking status: Never   Smokeless tobacco: Never  Vaping Use   Vaping status: Never Used  Substance and Sexual Activity   Alcohol use: No    Comment: social drinker.   Drug use: No   Sexual activity: Not Currently  Other Topics Concern   Not on file  Social History Narrative   Lives alone. 1 brother living.    Social Drivers of Corporate investment banker Strain: Low Risk  (08/14/2022)   Overall Financial Resource Strain (CARDIA)    Difficulty of Paying Living Expenses: Not hard at all  Food Insecurity: No Food Insecurity (08/14/2022)   Hunger Vital Sign    Worried About Running Out of Food in the Last Year: Never true    Ran Out of Food in the Last Year: Never true  Transportation Needs: No Transportation Needs (08/14/2022)   PRAPARE - Administrator, Civil Service (Medical): No    Lack of Transportation (Non-Medical): No  Physical Activity: Insufficiently Active (08/14/2022)   Exercise Vital Sign    Days of Exercise per Week: 3 days    Minutes of Exercise per Session: 30 min  Stress: No Stress Concern Present (08/14/2022)   [employer] of Occupational Health - Occupational Stress Questionnaire    Feeling of Stress : Not at all  Social Connections: Moderately Integrated (08/14/2022)   Social Connection and Isolation Panel    Frequency of Communication with Friends and  Family: More than three times a week    Frequency of Social Gatherings with Friends and Family: Three times a week    Attends Religious Services: 1 to 4 times per year    Active Member of Clubs or Organizations: Yes    Attends Banker Meetings: More than 4 times per year    Marital Status: Never married  Intimate Partner Violence: Not At Risk (08/14/2022)   Humiliation, Afraid, Rape, and Kick questionnaire    Fear of Current or Ex-Partner: No    Emotionally Abused: No    Physically Abused: No    Sexually Abused: No   Family History  Problem Relation Age of Onset   Hyperlipidemia Mother    Heart disease Father    Hyperlipidemia Father  Hypertension Father    Kidney disease Sister    Kidney cancer Sister    Ovarian cancer Maternal Aunt    Kidney disease Maternal Aunt        x 2   Kidney disease Maternal Uncle        x 2   Heart disease Brother    Stroke Brother    Heart disease Brother    Colon cancer Neg Hx    Stomach cancer Neg Hx    Breast cancer Neg Hx       Review of Systems  All other systems reviewed and are negative.      Objective:   Physical Exam Vitals reviewed.  Constitutional:      General: She is not in acute distress.    Appearance: She is well-developed. She is not diaphoretic.  HENT:     Head: Normocephalic and atraumatic.     Right Ear: External ear normal.     Left Ear: External ear normal.     Nose: Nose normal.     Mouth/Throat:     Pharynx: No oropharyngeal exudate.  Eyes:     General: No scleral icterus.       Right eye: No discharge.        Left eye: No discharge.     Conjunctiva/sclera: Conjunctivae normal.     Pupils: Pupils are equal, round, and reactive to light.  Neck:     Thyroid : No thyromegaly.     Vascular: No JVD.     Trachea: No tracheal deviation.  Cardiovascular:     Rate and Rhythm: Normal rate and regular rhythm.     Heart sounds: Normal heart sounds. No murmur heard.    No friction rub. No gallop.   Pulmonary:     Effort: Pulmonary effort is normal. No respiratory distress.     Breath sounds: Normal breath sounds. No stridor. No wheezing or rales.  Chest:     Chest wall: No tenderness.  Abdominal:     General: Bowel sounds are normal. There is no distension.     Palpations: Abdomen is soft. There is no mass.     Tenderness: There is no abdominal tenderness. There is no guarding or rebound.  Musculoskeletal:        General: No tenderness. Normal range of motion.     Cervical back: Normal range of motion and neck supple.     Right lower leg: Edema present.     Left lower leg: Edema present.  Feet:     Left foot:     Skin integrity: Ulcer present.  Lymphadenopathy:     Cervical: No cervical adenopathy.  Skin:    General: Skin is warm.     Coloration: Skin is not pale.     Findings: No erythema or rash.  Neurological:     Mental Status: She is alert and oriented to person, place, and time.     Cranial Nerves: No cranial nerve deficit.     Motor: No abnormal muscle tone.     Coordination: Coordination normal.     Deep Tendon Reflexes: Reflexes are normal and symmetric.  Psychiatric:        Behavior: Behavior normal.        Thought Content: Thought content normal.        Judgment: Judgment normal.         Assessment & Plan:   Type 2 diabetes mellitus with complications (HCC) - Plan: Microalbumin / creatinine urine ratio  Colon cancer screening - Plan: Cologuard  Postmenopausal estrogen deficiency - Plan: DG Bone Density  NASH (nonalcoholic steatohepatitis)  Lymphedema  General medical exam Blood pressure today is acceptable. Lab on 05/05/2024  Component Date Value Ref Range Status   WBC 05/05/2024 9.1  3.8 - 10.8 Thousand/uL Final   RBC 05/05/2024 4.17  3.80 - 5.10 Million/uL Final   Hemoglobin 05/05/2024 12.5  11.7 - 15.5 g/dL Final   HCT 90/96/7974 37.6  35.0 - 45.0 % Final   MCV 05/05/2024 90.2  80.0 - 100.0 fL Final   MCH 05/05/2024 30.0  27.0 - 33.0 pg  Final   MCHC 05/05/2024 33.2  32.0 - 36.0 g/dL Final   Comment: For adults, a slight decrease in the calculated MCHC value (in the range of 30 to 32 g/dL) is most likely not clinically significant; however, it should be interpreted with caution in correlation with other red cell parameters and the patient's clinical condition.    RDW 05/05/2024 13.5  11.0 - 15.0 % Final   Platelets 05/05/2024 134 (L)  140 - 400 Thousand/uL Final   MPV 05/05/2024 10.3  7.5 - 12.5 fL Final   Neutro Abs 05/05/2024 5,533  1,500 - 7,800 cells/uL Final   Absolute Lymphocytes 05/05/2024 2,903  850 - 3,900 cells/uL Final   Absolute Monocytes 05/05/2024 419  200 - 950 cells/uL Final   Eosinophils Absolute 05/05/2024 173  15 - 500 cells/uL Final   Basophils Absolute 05/05/2024 73  0 - 200 cells/uL Final   Neutrophils Relative % 05/05/2024 60.8  % Final   Total Lymphocyte 05/05/2024 31.9  % Final   Monocytes Relative 05/05/2024 4.6  % Final   Eosinophils Relative 05/05/2024 1.9  % Final   Basophils Relative 05/05/2024 0.8  % Final   Glucose, Bld 05/05/2024 217 (H)  65 - 99 mg/dL Final   Comment: .            Fasting reference interval . For someone without known diabetes, a glucose value >125 mg/dL indicates that they may have diabetes and this should be confirmed with a follow-up test. .    BUN 05/05/2024 11  7 - 25 mg/dL Final   Creat 90/96/7974 0.86  0.50 - 1.05 mg/dL Final   BUN/Creatinine Ratio 05/05/2024 SEE NOTE:  6 - 22 (calc) Final   Comment:    Not Reported: BUN and Creatinine are within    reference range. .    Sodium 05/05/2024 140  135 - 146 mmol/L Final   Potassium 05/05/2024 4.3  3.5 - 5.3 mmol/L Final   Chloride 05/05/2024 102  98 - 110 mmol/L Final   CO2 05/05/2024 31  20 - 32 mmol/L Final   Calcium  05/05/2024 9.1  8.6 - 10.4 mg/dL Final   Total Protein 90/96/7974 6.5  6.1 - 8.1 g/dL Final   Albumin 90/96/7974 4.1  3.6 - 5.1 g/dL Final   Globulin 90/96/7974 2.4  1.9 - 3.7 g/dL  (calc) Final   AG Ratio 05/05/2024 1.7  1.0 - 2.5 (calc) Final   Total Bilirubin 05/05/2024 0.4  0.2 - 1.2 mg/dL Final   Alkaline phosphatase (APISO) 05/05/2024 71  37 - 153 U/L Final   AST 05/05/2024 13  10 - 35 U/L Final   ALT 05/05/2024 14  6 - 29 U/L Final   Hgb A1c MFr Bld 05/05/2024 8.7 (H)  <5.7 % Final   Comment: For someone without known diabetes, a hemoglobin A1c value of 6.5% or greater indicates that  they may have  diabetes and this should be confirmed with a follow-up  test. . For someone with known diabetes, a value <7% indicates  that their diabetes is well controlled and a value  greater than or equal to 7% indicates suboptimal  control. A1c targets should be individualized based on  duration of diabetes, age, comorbid conditions, and  other considerations. . Currently, no consensus exists regarding use of hemoglobin A1c for diagnosis of diabetes for children. .    Mean Plasma Glucose 05/05/2024 203  mg/dL Final   eAG (mmol/L) 90/96/7974 11.2  mmol/L Final   Cholesterol 05/05/2024 166  <200 mg/dL Final   HDL 90/96/7974 30 (L)  > OR = 50 mg/dL Final   Triglycerides 90/96/7974 157 (H)  <150 mg/dL Final   LDL Cholesterol (Calc) 05/05/2024 109 (H)  mg/dL (calc) Final   Comment: Reference range: <100 . Desirable range <100 mg/dL for primary prevention;   <70 mg/dL for patients with CHD or diabetic patients  with > or = 2 CHD risk factors. SABRA LDL-C is now calculated using the Martin-Hopkins  calculation, which is a validated novel method providing  better accuracy than the Friedewald equation in the  estimation of LDL-C.  Gladis APPLETHWAITE et al. SANDREA. 7986;689(80): 2061-2068  (http://education.[facility].com/faq/FAQ164)    Total CHOL/HDL Ratio 05/05/2024 5.5 (H)  <5.0 (calc) Final   Non-HDL Cholesterol (Calc) 05/05/2024 136 (H)  <130 mg/dL (calc) Final   Comment: For patients with diabetes plus 1 major ASCVD risk  factor, treating to a non-HDL-C goal of <100 mg/dL   (LDL-C of <29 mg/dL) is considered a therapeutic  option.    Hemoglobin A1c is elevated at 8.7.  Patient is unable to afford Farxiga .  She is unable to afford Trulicity .  She is only able to afford generic options.  She is already on glipizide  and metformin .  For that reason we will add Actos .  I cautioned the patient that Actos  will cause increased swelling in her legs.  She is already dealing with lymphedema and has a venous stasis ulcer on her right lateral shin.  However options are extremely limited.  The other option would be insulin  which she declines today.  I asked her to call her insurance and inquire about Mounjaro.  I believe that this will be her best option given her NASH as this would also help with weight loss.  Her cholesterol is slightly elevated.  I will schedule the patient for a bone density test.  I will also schedule the patient for Cologuard to screen for colon cancer.  She states that she will schedule her mammogram.  She refuses all vaccinations including a flu shot, shingles vaccine, RSV, and COVID.  Patient does have neuropathic pain and numbness and tingling in both hands.  I recommended nerve conduction studies to determine whether this is carpal tunnel versus worsening peripheral neuropathy from her uncontrolled diabetes.  Patient states that she wants to check with a family member first and then she will let me know if she wants me to schedule this.  Recheck hemoglobin A1c in 4 months.  If not under control at that point after adding Actos  and making dietary changes, patient will concede to add insulin 

## 2024-05-10 NOTE — Telephone Encounter (Signed)
 Requested Prescriptions  Pending Prescriptions Disp Refills   pravastatin  (PRAVACHOL ) 40 MG tablet [Pharmacy Med Name: PRAVASTATIN  SODIUM 40 MG Oral Tablet] 90 tablet 0    Sig: TAKE 1 TABLET EVERY DAY     Cardiovascular:  Antilipid - Statins Failed - 05/10/2024  2:36 PM      Failed - Lipid Panel in normal range within the last 12 months    Cholesterol  Date Value Ref Range Status  05/05/2024 166 <200 mg/dL Final   LDL Cholesterol (Calc)  Date Value Ref Range Status  05/05/2024 109 (H) mg/dL (calc) Final    Comment:    Reference range: <100 . Desirable range <100 mg/dL for primary prevention;   <70 mg/dL for patients with CHD or diabetic patients  with > or = 2 CHD risk factors. SABRA LDL-C is now calculated using the Martin-Hopkins  calculation, which is a validated novel method providing  better accuracy than the Friedewald equation in the  estimation of LDL-C.  Gladis APPLETHWAITE et al. SANDREA. 7986;689(80): 2061-2068  (http://education.[facility].com/faq/FAQ164)    HDL  Date Value Ref Range Status  05/05/2024 30 (L) > OR = 50 mg/dL Final   Triglycerides  Date Value Ref Range Status  05/05/2024 157 (H) <150 mg/dL Final         Passed - Patient is not pregnant      Passed - Valid encounter within last 12 months    Recent Outpatient Visits           Today Type 2 diabetes mellitus with complications [facility])   [place] [facility] Duanne Butler DASEN, MD   6 months ago Type 2 diabetes mellitus with complications [facility])   [place] [facility] Duanne Butler DASEN, MD   1 year ago Type 2 diabetes mellitus with complications [facility])   [place] [facility] Pickard, Butler DASEN, MD   1 year ago NASH (nonalcoholic steatohepatitis)   [place] [facility] Duanne Butler DASEN, MD   2 years ago Left sided sciatica   [place] [facility] Pickard, Butler DASEN, MD       Future Appointments              In 1 month Gollan, Timothy J, MD [facility]

## 2024-05-12 ENCOUNTER — Ambulatory Visit: Admitting: Podiatry

## 2024-05-12 DIAGNOSIS — L97521 Non-pressure chronic ulcer of other part of left foot limited to breakdown of skin: Secondary | ICD-10-CM

## 2024-05-13 NOTE — Progress Notes (Signed)
  Subjective:  Patient ID: Linda Briggs, female    DOB: Apr 29, 1956,  MRN: [id number]  Chief Complaint  Patient presents with   Nail Problem   Callouses    Left foot callous.     68 y.o. female presents with the above complaint. History confirmed with patient.  She notes that is doing better not seeing any drainage from it  Objective:  Physical Exam: warm, good capillary refill, normal DP and PT pulses, and diffuse neuropathy, prior ulceration site has scabbed over and has no drainage     ABIs without significant atherosclerosis   Assessment:   1. Ulcer of left foot, limited to breakdown of skin [facility])           Plan:  Patient was evaluated and treated and all questions answered.  Ulcer left foot - Ulceration has healed and epithelialized. Use moisturizing lotion.  Watch for recurrence.  Continue supplement edema pumps.  Return in 1 month to reevaluate.    No follow-ups on file.

## 2024-05-18 ENCOUNTER — Other Ambulatory Visit: Payer: Self-pay | Admitting: Cardiovascular Disease

## 2024-05-18 ENCOUNTER — Telehealth: Payer: Self-pay

## 2024-05-18 ENCOUNTER — Other Ambulatory Visit: Payer: Self-pay

## 2024-05-18 DIAGNOSIS — E118 Type 2 diabetes mellitus with unspecified complications: Secondary | ICD-10-CM

## 2024-05-18 DIAGNOSIS — K7581 Nonalcoholic steatohepatitis (NASH): Secondary | ICD-10-CM

## 2024-05-18 MED ORDER — TIRZEPATIDE 2.5 MG/0.5ML ~~LOC~~ SOAJ: 2.5000 mg | SUBCUTANEOUS | 0 refills | Status: DC

## 2024-05-18 NOTE — Telephone Encounter (Signed)
 Copied from CRM [id number]. Topic: Clinical - Medication Question >> May 18, 2024 11:00 AM Rosaria BRAVO wrote: Reason for CRM: Pt called reporting that she believes she can afford Mounjaro , she wants her PCP to know so he can consider prescribing this for her.

## 2024-05-18 NOTE — Telephone Encounter (Signed)
 Copied from CRM [id number]. Topic: General - Other >> May 18, 2024 10:58 AM Rosaria BRAVO wrote: Reason for CRM: Pt has an appt October 8th with the [facility] Dr. Layman Meissner  Phone: [phone] Fax: [phone]  They are missing office notes and insurance information ([employer]).

## 2024-05-19 ENCOUNTER — Telehealth: Payer: Self-pay

## 2024-05-19 NOTE — Telephone Encounter (Signed)
 Copied from CRM [id number]. Topic: General - Other >> May 18, 2024 10:58 AM Linda Briggs wrote: Reason for CRM: Pt has an appt October 8th with the [facility] Dr. Layman Meissner  Phone: [phone] Fax: [phone]  They are missing office notes and insurance information ([employer]). >> May 19, 2024  9:11 AM Montie POUR wrote: [facility] is calling and needs insurance information and new office note because the office note that was sent was only on left half of page faxed to them as soon as possible Phone: [phone] Fax: [phone]

## 2024-05-27 ENCOUNTER — Ambulatory Visit (INDEPENDENT_AMBULATORY_CARE_PROVIDER_SITE_OTHER): Admitting: *Deleted

## 2024-05-27 VITALS — Ht 66.0 in | Wt 298.0 lb

## 2024-05-27 DIAGNOSIS — Z0000 Encounter for general adult medical examination without abnormal findings: Secondary | ICD-10-CM

## 2024-05-27 NOTE — Progress Notes (Signed)
 Subjective:   Linda Briggs is a 68 y.o. female who presents for Medicare Annual (Subsequent) preventive examination.  Visit Complete: Virtual I connected with  [name] on 05/27/24 by a audio enabled telemedicine application and verified that I am speaking with the correct person using two identifiers.  Patient Location: Home  Provider Location: Home Office  I discussed the limitations of evaluation and management by telemedicine. The patient expressed understanding and agreed to proceed.  Vital Signs: Because this visit was a virtual/telehealth visit, some criteria may be missing or patient reported. Any vitals not documented were not able to be obtained and vitals that have been documented are patient reported.  Cardiac Risk Factors include: advanced age (>44men, >94 women);obesity (BMI >30kg/m2);diabetes mellitus     Objective:    Today's Vitals   05/27/24 1446  Weight: 298 lb (135.2 kg)  Height: 5' 6 (1.676 m)   Body mass index is 48.1 kg/m.     05/27/2024    2:47 PM 08/14/2022    9:33 AM 08/09/2021    2:19 PM 08/05/2020    1:15 PM 04/28/2018    8:35 AM 02/09/2015   11:45 AM 10/19/2014    8:35 PM  Advanced Directives  Does Patient Have a Medical Advance Directive? Yes Yes Yes Yes Yes  Yes  Yes   Type of Estate agent of [employer] Power of [place];Living will Healthcare Power of [place];Living will Healthcare Power of [employer] of [place];Living will Healthcare Power of [employer] of [place];Living will;Advance instruction for mental health treatment   Does patient want to make changes to medical advance directive?      No - Patient declined  No - Patient declined   Copy of Healthcare Power of Attorney in Chart? No - copy requested No - copy requested No - copy requested  Yes  No - copy requested  No - copy requested   Would patient like information on creating a medical advance directive?   No -  Patient declined         Data saved with a previous flowsheet row definition    Current Medications (verified) Outpatient Encounter Medications as of 05/27/2024  Medication Sig   ACCU-CHEK GUIDE TEST test strip TEST BLOOD SUGAR THREE TIMES DAILY   Accu-Chek Softclix Lancets lancets TEST BLOOD SUGAR THREE TIMES DAILY   Alcohol Swabs (DROPSAFE ALCOHOL PREP) 70 % PADS USE AS DIRECTED THREE TIMES DAILY   aspirin EC 81 MG tablet Take 81 mg by mouth daily.   Blood Glucose Calibration (TRUE METRIX LEVEL 2) Normal SOLN Use as directed to monitor FSBS 3x daily. Dx: E11.65   Blood Glucose Monitoring Suppl DEVI 1 each by Does not apply route in the morning, at noon, and at bedtime. May substitute to any manufacturer covered by patient's insurance.   cholecalciferol (VITAMIN D3) 25 MCG (1000 UNIT) tablet Take 1,000 Units by mouth daily.   cyanocobalamin  (VITAMIN B12) 1000 MCG tablet Take 1,000 mcg by mouth daily.   furosemide  (LASIX ) 20 MG tablet TAKE 1 TABLET EVERY DAY   gabapentin  (NEURONTIN ) 300 MG capsule TAKE 1 CAPSULE THREE TIMES DAILY   glipiZIDE  (GLIPIZIDE  XL) 10 MG 24 hr tablet Take 1 tablet (10 mg total) by mouth daily with breakfast.   metFORMIN  (GLUCOPHAGE ) 1000 MG tablet TAKE 1 TABLET TWICE DAILY WITH MEALS   metoprolol  tartrate (LOPRESSOR ) 50 MG tablet TAKE 1/2 TABLET TWICE DAILY   Multiple Vitamin (MULTIVITAMIN) tablet Take 1  tablet by mouth daily.   ondansetron  (ZOFRAN ) 4 MG tablet TAKE 1 TABLET (4 MG TOTAL) BY MOUTH EVERY 8 (EIGHT) HOURS AS NEEDED FOR NAUSEA OR VOMITING.   pantoprazole  (PROTONIX ) 40 MG tablet TAKE 1 TABLET TWICE DAILY BEFORE MEALS   potassium chloride  (KLOR-CON  M) 10 MEQ tablet TAKE 1 TABLET EVERY DAY (NEED MD APPOINTMENT)   pravastatin  (PRAVACHOL ) 40 MG tablet TAKE 1 TABLET EVERY DAY   tirzepatide  (MOUNJARO ) 2.5 MG/0.5ML Pen Inject 2.5 mg into the skin once a week.   traMADol  (ULTRAM ) 50 MG tablet TAKE 1 TABLET BY MOUTH EVERY 6 HOURS AS NEEDED FOR  CHRONIC  BACK  PAIN    No facility-administered encounter medications on file as of 05/27/2024.    Allergies (verified) Codeine, Penicillins, [place], Ciprofloxacin , Food, Januvia [sitagliptin], and Latex   History: 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     Peripheral vascular disease    Shortness of breath dyspnea    on lopressor     Sleep apnea    pt had study done but it was incomplete due to blood sugar issues    Past Surgical History:  Procedure Laterality Date   BIOPSY  04/28/2018   Procedure: BIOPSY;  Surgeon: Aneita Gwendlyn DASEN, MD;  Location: [facility];  Service: Endoscopy;;   BIOPSY THYROID      january 2016   ESOPHAGOGASTRODUODENOSCOPY (EGD) WITH PROPOFOL  N/A 04/28/2018   Procedure: ESOPHAGOGASTRODUODENOSCOPY (EGD) WITH PROPOFOL ;  Surgeon: Aneita Gwendlyn DASEN, MD;  Location: [facility];  Service: Endoscopy;  Laterality: N/A;   HYSTEROSCOPY WITH D & C N/A 02/15/2015   Procedure: DILATATION AND CURETTAGE /HYSTEROSCOPY;  Surgeon: Duwaine Blumenthal, DO;  Location: [facility];  Service: Gynecology;  Laterality: N/A;   TONSILLECTOMY     Family History  Problem Relation Age of Onset   Hyperlipidemia Mother    Heart disease Father    Hyperlipidemia Father    Hypertension Father    Kidney disease Sister    Kidney cancer Sister    Ovarian cancer Maternal Aunt    Kidney disease Maternal Aunt        x 2   Kidney disease Maternal Uncle        x 2   Heart disease Brother    Stroke Brother    Heart disease Brother    Colon cancer Neg Hx    Stomach cancer Neg Hx    Breast cancer Neg Hx    Social History   Socioeconomic History   Marital status: Single    Spouse name: Not on file   Number of children: 0   Years of  education: Not on file   Highest education level: Not on file  Occupational History   Occupation: retired  Tobacco Use   Smoking status: Never   Smokeless tobacco: Never  Vaping Use   Vaping status: Never Used  Substance and Sexual Activity   Alcohol use: No    Comment: social drinker.   Drug use: No   Sexual activity: Not Currently  Other Topics Concern   Not on file  Social History Narrative   Lives alone. 1 brother  living.    Social Drivers of Corporate investment banker Strain: Low Risk  (05/27/2024)   Overall Financial Resource Strain (CARDIA)    Difficulty of Paying Living Expenses: Not hard at all  Food Insecurity: No Food Insecurity (05/27/2024)   Hunger Vital Sign    Worried About Running Out of Food in the Last Year: Never true    Ran Out of Food in the Last Year: Never true  Transportation Needs: No Transportation Needs (05/27/2024)   PRAPARE - Administrator, Civil Service (Medical): No    Lack of Transportation (Non-Medical): No  Physical Activity: Inactive (05/27/2024)   Exercise Vital Sign    Days of Exercise per Week: 0 days    Minutes of Exercise per Session: 0 min  Stress: No Stress Concern Present (05/27/2024)   [employer] of Occupational Health - Occupational Stress Questionnaire    Feeling of Stress: Not at all  Social Connections: Socially Isolated (05/27/2024)   Social Connection and Isolation Panel    Frequency of Communication with Friends and Family: More than three times a week    Frequency of Social Gatherings with Friends and Family: Once a week    Attends Religious Services: Never    Database administrator or Organizations: No    Attends Banker Meetings: Never    Marital Status: Widowed    Tobacco Counseling Counseling given: Not Answered   Clinical Intake:  Pre-visit preparation completed: Yes  Pain : No/denies pain     Diabetes: Yes CBG done?: No Did pt. bring in CBG monitor from home?: No  How  often do you need to have someone help you when you read instructions, pamphlets, or other written materials from your doctor or pharmacy?: 1 - Never  Interpreter Needed?: No  Information entered by :: Mliss Graff LPN   Activities of Daily Living    05/27/2024    2:46 PM  In your present state of health, do you have any difficulty performing the following activities:  Hearing? 0  Vision? 0  Difficulty concentrating or making decisions? 0  Walking or climbing stairs? 1  Dressing or bathing? 0  Doing errands, shopping? 0  Preparing Food and eating ? N  Using the Toilet? N  In the past six months, have you accidently leaked urine? N  Do you have problems with loss of bowel control? N  Managing your Medications? N  Managing your Finances? N  Housekeeping or managing your Housekeeping? N    Patient Care Team: Duanne Butler DASEN, MD as PCP - General (Family Medicine) Perla Evalene PARAS, MD as PCP - Cardiology (Cardiology) Nicholaus Sherlean CROME, [facility] (Inactive) as Pharmacist (Pharmacist)  Indicate any recent Medical Services you may have received from other than [facility] providers in the past year (date may be approximate).     Assessment:   This is a routine wellness examination for Mikala.  Hearing/Vision screen Hearing Screening - Comments:: Some trouble hearing Vision Screening - Comments:: Up to date [facility] eye [place]  specialist   Goals Addressed             This Visit's Progress    DIET - INCREASE WATER INTAKE   On track    Exercise 3x per week (30 min per time)   Not on track    Encouraged pt to return to [facility] to exercise.     Weight (lb) < 200 lb (90.7 kg)   298 lb (135.2 kg)  Depression Screen    05/27/2024    2:55 PM 04/28/2023   11:21 AM 08/14/2022    9:22 AM 08/09/2021    2:11 PM 05/17/2021   12:42 PM 03/26/2021   11:20 AM 11/17/2017   11:36 AM  PHQ 2/9 Scores  PHQ - 2 Score 0 0 0 0 0 0 1  PHQ- 9 Score 6          Fall Risk    05/27/2024     2:45 PM 05/10/2024   11:06 AM 04/28/2023   11:21 AM 08/14/2022    9:34 AM 08/14/2022    9:33 AM  Fall Risk   Falls in the past year? 1 1 1 1  0  Number falls in past yr: 1 0 0 1 0  Injury with Fall? 1 0 0 1 0  Risk for fall due to : Impaired balance/gait;Impaired mobility History of fall(s);Impaired balance/gait;Impaired mobility History of fall(s) History of fall(s);Impaired balance/gait;Orthopedic patient No Fall Risks  Follow up Falls evaluation completed;Education provided;Falls prevention discussed Falls evaluation completed;Education provided  Education provided;Falls prevention discussed  Falls prevention discussed      Data saved with a previous flowsheet row definition    MEDICARE RISK AT HOME: Medicare Risk at Home Any stairs in or around the home?: No If so, are there any without handrails?: No Home free of loose throw rugs in walkways, pet beds, electrical cords, etc?: Yes Adequate lighting in your home to reduce risk of falls?: Yes Life alert?: No Use of a cane, walker or w/c?: Yes Grab bars in the bathroom?: Yes Shower chair or bench in shower?: Yes Elevated toilet seat or a handicapped toilet?: Yes  TIMED UP AND GO:  Was the test performed?  No    Cognitive Function:        05/27/2024    2:49 PM 08/14/2022    9:36 AM 08/09/2021    2:25 PM  6CIT Screen  What Year? 0 points 0 points 0 points  What month? 0 points 0 points 0 points  What time? 0 points 0 points 0 points  Count back from 20 0 points 0 points 0 points  Months in reverse 0 points 0 points 0 points  Repeat phrase 0 points 0 points 0 points  Total Score 0 points 0 points 0 points    Immunizations Immunization History  Administered Date(s) Administered   Influenza,inj,Quad PF,6+ Mos 05/30/2014, 05/29/2015, 07/02/2017, 05/22/2018   Pneumococcal Conjugate-13 08/18/2017   Pneumococcal Polysaccharide-23 08/29/2014    TDAP status: Due, Education has been provided regarding the importance of this  vaccine. Advised may receive this vaccine at local pharmacy or Health Dept. Aware to provide a copy of the vaccination record if obtained from local pharmacy or Health Dept. Verbalized acceptance and understanding.  Flu Vaccine status: Declined, Education has been provided regarding the importance of this vaccine but patient still declined. Advised may receive this vaccine at local pharmacy or Health Dept. Aware to provide a copy of the vaccination record if obtained from local pharmacy or Health Dept. Verbalized acceptance and understanding.  Pneumococcal vaccine status: Up to date  Covid-19 vaccine status: Declined, Education has been provided regarding the importance of this vaccine but patient still declined. Advised may receive this vaccine at local pharmacy or Health Dept.or vaccine clinic. Aware to provide a copy of the vaccination record if obtained from local pharmacy or Health Dept. Verbalized acceptance and understanding.  Qualifies for Shingles Vaccine? Yes   Zostavax completed No  Shingrix Completed?: No.    Education has been provided regarding the importance of this vaccine. Patient has been advised to call insurance company to determine out of pocket expense if they have not yet received this vaccine. Advised may also receive vaccine at local pharmacy or Health Dept. Verbalized acceptance and understanding.  Screening Tests Health Maintenance  Topic Date Due   Zoster Vaccines- Shingrix (1 of 2) Never done   Colonoscopy  Never done   DEXA SCAN  Never done   Pneumococcal Vaccine: 50+ Years (3 of 3 - PCV20 or PCV21) 08/18/2022   FOOT EXAM  10/25/2023   OPHTHALMOLOGY EXAM  12/19/2023   Diabetic kidney evaluation - Urine ACR  04/22/2024   Influenza Vaccine  11/30/2024 (Originally 04/02/2024)   HEMOGLOBIN A1C  11/02/2024   Mammogram  01/21/2025   Diabetic kidney evaluation - eGFR measurement  05/05/2025   Medicare Annual Wellness (AWV)  05/27/2025   HPV VACCINES  Aged Out    Meningococcal B Vaccine  Aged Out   DTaP/Tdap/Td  Discontinued   COVID-19 Vaccine  Discontinued   Hepatitis C Screening  Discontinued    Health Maintenance  Health Maintenance Due  Topic Date Due   Zoster Vaccines- Shingrix (1 of 2) Never done   Colonoscopy  Never done   DEXA SCAN  Never done   Pneumococcal Vaccine: 50+ Years (3 of 3 - PCV20 or PCV21) 08/18/2022   FOOT EXAM  10/25/2023   OPHTHALMOLOGY EXAM  12/19/2023   Diabetic kidney evaluation - Urine ACR  04/22/2024   Cologuard    patient has waiting to do  Mammogram scheduled 05-30-2024  Bone Density status: Ordered  . Pt provided with contact info and advised to call to schedule appt.  Lung Cancer Screening: (Low Dose CT Chest recommended if Age 39-80 years, 20 pack-year currently smoking OR have quit w/in 15years.) does not qualify.   Lung Cancer Screening Referral:   Additional Screening:  Hepatitis C Screening: does not qualify; Completed 2024  Vision Screening: Recommended annual ophthalmology exams for early detection of glaucoma and other disorders of the eye. Is the patient up to date with their annual eye exam?  Yes  Who is the provider or what is the name of the office in which the patient attends annual eye exams? [facility] If pt is not established with a provider, would they like to be referred to a provider to establish care? No .   Dental Screening: Recommended annual dental exams for proper oral hygiene  Nutrition Risk Assessment:  Has the patient had any N/V/D within the last 2 months?  No  Does the patient have any non-healing wounds?  No  Has the patient had any unintentional weight loss or weight gain?  No   Diabetes:  Is the patient diabetic?  Yes  If diabetic, was a CBG obtained today?  Yes  Did the patient bring in their glucometer from home?  No  How often do you monitor your CBG's? 2-3 days.   Financial Strains and Diabetes Management:  Are you having any financial strains with the  device, your supplies or your medication? No .  Does the patient want to be seen by Chronic Care Management for management of their diabetes?  No  Would the patient like to be referred to a Nutritionist or for Diabetic Management?  No   Diabetic Exams:  Diabetic Eye Exam: Completed . Pt has been advised about the importance in completing this exam.   Diabetic Foot Exam:.  Pt has been advised about the importance in completing this exam. .    Community Resource Referral / Chronic Care Management: CRR required this visit?  No   CCM required this visit?  No     Plan:     I have personally reviewed and noted the following in the patient's chart:   Medical and social history Use of alcohol, tobacco or illicit drugs  Current medications and supplements including opioid prescriptions. Patient is currently taking opioid prescriptions. Information provided to patient regarding non-opioid alternatives. Patient advised to discuss non-opioid treatment plan with their provider. Functional ability and status Nutritional status Physical activity Advanced directives List of other physicians Hospitalizations, surgeries, and ER visits in previous 12 months Vitals Screenings to include cognitive, depression, and falls Referrals and appointments  In addition, I have reviewed and discussed with patient certain preventive protocols, quality metrics, and best practice recommendations. A written personalized care plan for preventive services as well as general preventive health recommendations were provided to patient.     Mliss Graff, LPN   [id number]   After Visit Summary: (MyChart) Due to this being a telephonic visit, the after visit summary with patients personalized plan was offered to patient via MyChart   Nurse Notes:

## 2024-05-27 NOTE — Patient Instructions (Signed)
 Linda Briggs , Thank you for taking time to come for your Medicare Wellness Visit. I appreciate your ongoing commitment to your health goals. Please review the following plan we discussed and let me know if I can assist you in the future.   Screening recommendations/referrals: Colonoscopy: Education provided Mammogram: Education provided Bone Density: Education provided Recommended yearly ophthalmology/optometry visit for glaucoma screening and checkup Recommended yearly dental visit for hygiene and checkup  Vaccinations: Influenza vaccine:  Pneumococcal vaccine:  Tdap vaccine:  Shingles vaccine:       Preventive Care 65 Years and Older, Female Preventive care refers to lifestyle choices and visits with your health care provider that can promote health and wellness. What does preventive care include? A yearly physical exam. This is also called an annual well check. Dental exams once or twice a year. Routine eye exams. Ask your health care provider how often you should have your eyes checked. Personal lifestyle choices, including: Daily care of your teeth and gums. Regular physical activity. Eating a healthy diet. Avoiding tobacco and drug use. Limiting alcohol use. Practicing safe sex. Taking low-dose aspirin every day. Taking vitamin and mineral supplements as recommended by your health care provider. What happens during an annual well check? The services and screenings done by your health care provider during your annual well check will depend on your age, overall health, lifestyle risk factors, and family history of disease. Counseling  Your health care provider may ask you questions about your: Alcohol use. Tobacco use. Drug use. Emotional well-being. Home and relationship well-being. Sexual activity. Eating habits. History of falls. Memory and ability to understand (cognition). Work and work Astronomer. Reproductive health. Screening  You may have the following  tests or measurements: Height, weight, and BMI. Blood pressure. Lipid and cholesterol levels. These may be checked every 5 years, or more frequently if you are over 30 years old. Skin check. Lung cancer screening. You may have this screening every year starting at age 35 if you have a 30-pack-year history of smoking and currently smoke or have quit within the past 15 years. Fecal occult blood test (FOBT) of the stool. You may have this test every year starting at age 66. Flexible sigmoidoscopy or colonoscopy. You may have a sigmoidoscopy every 5 years or a colonoscopy every 10 years starting at age 39. Hepatitis C blood test. Hepatitis B blood test. Sexually transmitted disease (STD) testing. Diabetes screening. This is done by checking your blood sugar (glucose) after you have not eaten for a while (fasting). You may have this done every 1-3 years. Bone density scan. This is done to screen for osteoporosis. You may have this done starting at age 69. Mammogram. This may be done every 1-2 years. Talk to your health care provider about how often you should have regular mammograms. Talk with your health care provider about your test results, treatment options, and if necessary, the need for more tests. Vaccines  Your health care provider may recommend certain vaccines, such as: Influenza vaccine. This is recommended every year. Tetanus, diphtheria, and acellular pertussis (Tdap, Td) vaccine. You may need a Td booster every 10 years. Zoster vaccine. You may need this after age 59. Pneumococcal 13-valent conjugate (PCV13) vaccine. One dose is recommended after age 25. Pneumococcal polysaccharide (PPSV23) vaccine. One dose is recommended after age 33. Talk to your health care provider about which screenings and vaccines you need and how often you need them. This information is not intended to replace advice given to you by  your health care provider. Make sure you discuss any questions you have with  your health care provider. Document Released: 09/15/2015 Document Revised: 05/08/2016 Document Reviewed: 06/20/2015 Elsevier Interactive Patient Education  2017 [employer].  Fall Prevention in the Home Falls can cause injuries. They can happen to people of all ages. There are many things you can do to make your home safe and to help prevent falls. What can I do on the outside of my home? Regularly fix the edges of walkways and driveways and fix any cracks. Remove anything that might make you trip as you walk through a door, such as a raised step or threshold. Trim any bushes or trees on the path to your home. Use bright outdoor lighting. Clear any walking paths of anything that might make someone trip, such as rocks or tools. Regularly check to see if handrails are loose or broken. Make sure that both sides of any steps have handrails. Any raised decks and porches should have guardrails on the edges. Have any leaves, snow, or ice cleared regularly. Use sand or salt on walking paths during winter. Clean up any spills in your garage right away. This includes oil or grease spills. What can I do in the bathroom? Use night lights. Install grab bars by the toilet and in the tub and shower. Do not use towel bars as grab bars. Use non-skid mats or decals in the tub or shower. If you need to sit down in the shower, use a plastic, non-slip stool. Keep the floor dry. Clean up any water that spills on the floor as soon as it happens. Remove soap buildup in the tub or shower regularly. Attach bath mats securely with double-sided non-slip rug tape. Do not have throw rugs and other things on the floor that can make you trip. What can I do in the bedroom? Use night lights. Make sure that you have a light by your bed that is easy to reach. Do not use any sheets or blankets that are too big for your bed. They should not hang down onto the floor. Have a firm chair that has side arms. You can use this  for support while you get dressed. Do not have throw rugs and other things on the floor that can make you trip. What can I do in the kitchen? Clean up any spills right away. Avoid walking on wet floors. Keep items that you use a lot in easy-to-reach places. If you need to reach something above you, use a strong step stool that has a grab bar. Keep electrical cords out of the way. Do not use floor polish or wax that makes floors slippery. If you must use wax, use non-skid floor wax. Do not have throw rugs and other things on the floor that can make you trip. What can I do with my stairs? Do not leave any items on the stairs. Make sure that there are handrails on both sides of the stairs and use them. Fix handrails that are broken or loose. Make sure that handrails are as long as the stairways. Check any carpeting to make sure that it is firmly attached to the stairs. Fix any carpet that is loose or worn. Avoid having throw rugs at the top or bottom of the stairs. If you do have throw rugs, attach them to the floor with carpet tape. Make sure that you have a light switch at the top of the stairs and the bottom of the stairs. If you  do not have them, ask someone to add them for you. What else can I do to help prevent falls? Wear shoes that: Do not have high heels. Have rubber bottoms. Are comfortable and fit you well. Are closed at the toe. Do not wear sandals. If you use a stepladder: Make sure that it is fully opened. Do not climb a closed stepladder. Make sure that both sides of the stepladder are locked into place. Ask someone to hold it for you, if possible. Clearly mark and make sure that you can see: Any grab bars or handrails. First and last steps. Where the edge of each step is. Use tools that help you move around (mobility aids) if they are needed. These include: Canes. Walkers. Scooters. Crutches. Turn on the lights when you go into a dark area. Replace any light bulbs as  soon as they burn out. Set up your furniture so you have a clear path. Avoid moving your furniture around. If any of your floors are uneven, fix them. If there are any pets around you, be aware of where they are. Review your medicines with your doctor. Some medicines can make you feel dizzy. This can increase your chance of falling. Ask your doctor what other things that you can do to help prevent falls. This information is not intended to replace advice given to you by your health care provider. Make sure you discuss any questions you have with your health care provider. Document Released: 06/15/2009 Document Revised: 01/25/2016 Document Reviewed: 09/23/2014 Elsevier Interactive Patient Education  2017 [employer].

## 2024-06-09 DIAGNOSIS — G5620 Lesion of ulnar nerve, unspecified upper limb: Secondary | ICD-10-CM | POA: Diagnosis not present

## 2024-06-09 DIAGNOSIS — G629 Polyneuropathy, unspecified: Secondary | ICD-10-CM | POA: Diagnosis not present

## 2024-06-12 ENCOUNTER — Other Ambulatory Visit: Payer: Self-pay | Admitting: Family Medicine

## 2024-06-12 DIAGNOSIS — K7581 Nonalcoholic steatohepatitis (NASH): Secondary | ICD-10-CM

## 2024-06-12 DIAGNOSIS — E118 Type 2 diabetes mellitus with unspecified complications: Secondary | ICD-10-CM

## 2024-06-15 ENCOUNTER — Encounter: Payer: Self-pay | Admitting: Family Medicine

## 2024-06-15 ENCOUNTER — Telehealth: Payer: Self-pay

## 2024-06-15 DIAGNOSIS — G5600 Carpal tunnel syndrome, unspecified upper limb: Secondary | ICD-10-CM | POA: Insufficient documentation

## 2024-06-15 DIAGNOSIS — G5603 Carpal tunnel syndrome, bilateral upper limbs: Secondary | ICD-10-CM

## 2024-06-15 DIAGNOSIS — G589 Mononeuropathy, unspecified: Secondary | ICD-10-CM

## 2024-06-15 NOTE — Telephone Encounter (Signed)
 Per Dr. Duanne, Nerve Conduction study results showed carpel tunnel and pinched nerves at her elbows. Orthopedic hand surgeon is recommended. Pt advised of all and states she is fine with a consultation. Order placed. Mjp,lpn

## 2024-06-16 ENCOUNTER — Ambulatory Visit: Admitting: Podiatry

## 2024-06-16 VITALS — Ht 66.0 in | Wt 298.0 lb

## 2024-06-16 DIAGNOSIS — L97521 Non-pressure chronic ulcer of other part of left foot limited to breakdown of skin: Secondary | ICD-10-CM | POA: Diagnosis not present

## 2024-06-17 ENCOUNTER — Encounter: Payer: Self-pay | Admitting: Podiatry

## 2024-06-17 NOTE — Progress Notes (Signed)
  Subjective:  Patient ID: Linda Briggs, female    DOB: 03-Dec-1955,  MRN: [id number]  Chief Complaint  Patient presents with   Wound Check    RM 4 Patient is here for ulcer of the left foot. Wound is scabbed over, patient is concerned about a callus on lateral side of the left foot.    68 y.o. female presents with the above complaint. History confirmed with patient.  Main callus is doing better has a callus under the left foot lateral side  Objective:  Physical Exam: warm, good capillary refill, normal DP and PT pulses, and diffuse neuropathy, prior ulceration site remains healed has a callus submetatarsal 5 base on the left     ABIs without significant atherosclerosis   Assessment:   1. Ulcer of left foot, limited to breakdown of skin [facility])           Plan:  Patient was evaluated and treated and all questions answered.  Ulcer left foot - Ulcer site remains healed.  Skin here as well as the callus submetatarsal 5 for gently debrided with sharp scalpel to remove the hyperkeratosis.  Moisturizing lotion as needed.  Continue lymphedema treatment.  Return in 1 month to reevaluate.    Return in about 1 month (around 07/17/2024) for wound check.

## 2024-06-24 ENCOUNTER — Other Ambulatory Visit (INDEPENDENT_AMBULATORY_CARE_PROVIDER_SITE_OTHER): Payer: Self-pay

## 2024-06-24 ENCOUNTER — Ambulatory Visit: Admitting: Orthopedic Surgery

## 2024-06-24 DIAGNOSIS — M25521 Pain in right elbow: Secondary | ICD-10-CM | POA: Diagnosis not present

## 2024-06-24 DIAGNOSIS — M25522 Pain in left elbow: Secondary | ICD-10-CM

## 2024-06-24 DIAGNOSIS — G5623 Lesion of ulnar nerve, bilateral upper limbs: Secondary | ICD-10-CM

## 2024-06-24 DIAGNOSIS — G5603 Carpal tunnel syndrome, bilateral upper limbs: Secondary | ICD-10-CM | POA: Diagnosis not present

## 2024-06-24 DIAGNOSIS — R2232 Localized swelling, mass and lump, left upper limb: Secondary | ICD-10-CM | POA: Diagnosis not present

## 2024-06-24 NOTE — Progress Notes (Signed)
 Linda Briggs - 68 y.o. female MRN [id number]  Date of birth: 09-Jun-1956  Office Visit Note: Visit Date: 06/24/2024 PCP: Duanne Butler DASEN, MD Referred by: Duanne Butler DASEN, MD  Subjective: No chief complaint on file.  HPI: Linda Briggs is a pleasant 68 y.o. female who presents today for bilateral hand numbness and tingling that has been present now for greater than 6 months, worsening in nature.  She has undergone prior electrodiagnostic testing of the bilateral upper extremities which did demonstrate significant carpal tunnel syndrome and cubital tunnel syndrome.  She does have a history of peripheral neuropathy as well due to ongoing diabetes.  Her recent A1c was 8.7, was recently placed on Mounjaro  with upcoming A1c testing in December.  Pertinent ROS were reviewed with the patient and found to be negative unless otherwise specified above in HPI.   Visit Reason: bilateral arms Duration of symptoms:6+ months Hand dominance: right Occupation: retired Diabetic: Yes / 8.7 Smoking: No Heart/Lung History: none Blood Thinners:  none  Prior Testing/EMG: EMG Injections (Date): none Treatments: none Prior Surgery: none    Assessment & Plan: Visit Diagnoses:  1. Cubital tunnel syndrome, bilateral   2. Bilateral carpal tunnel syndrome   3. Mass of left forearm   4. Bilateral elbow joint pain     Plan: Based on her ongoing clinical examination and electrodiagnostic testing, patient has signs and symptoms consistent with bilateral cubital tunnel and carpal tunnel syndrome.  We discussed the etiology and pathophysiology of these conditions as well as treatment modalities ranging from conservative to surgical.  Given that her symptoms are worsening in nature, correlated with her electrodiagnostic findings, patient is indicated for bilateral, staged cubital tunnel and carpal tunnel release.  Risks and benefits of the surgery were discussed in detail.  I did explain that we generally  like the A1c to be below 8.0 prior to surgical scheduling in order to limit postoperative complications.  Patient expressed understanding, she does have upcoming A1c testing in December and will contact her office after this to confirm her results.  Also, she did mention the ongoing left arm mass on the ulnar aspect of the forearm.  On her x-rays today, there is a calcification in this region as well.  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  We may be able to perform mass excision in conjunction with her cubital tunnel and carpal tunnel release in the future.  I spent 45 minutes in the care of this patient today including review of previous documentation, imaging obtained, face-to-face time discussing all options regarding treatment and documenting the encounter.   Follow-up: No follow-ups on file.   Meds & Orders: No orders of the defined types were placed in this encounter.   Orders Placed This Encounter  Procedures   XR Elbow Complete Left (3+View)   XR Elbow Complete Right (3+View)   MR FOREARM LEFT W WO CONTRAST     Procedures: No procedures performed      Clinical History: No specialty comments available.  She reports that she has never smoked. She has never used smokeless tobacco.  Recent Labs    10/30/23 1119 05/05/24 1101  HGBA1C 8.0* 8.7*    Objective:   Vital Signs: There were no vitals taken for this visit.  Physical Exam  Gen: Well-appearing, in  no acute distress; non-toxic CV: Regular Rate. Well-perfused. Warm.  Resp: Breathing unlabored on room air; no wheezing. Psych: Fluid speech in conversation; appropriate affect; normal thought process  Ortho Exam PHYSICAL EXAM:  General: Patient is well appearing and in no distress.  Skin and Muscle: No significant skin  changes are apparent to upper extremities.   Range of Motion and Palpation Tests: Mobility is full about the elbows with flexion and extension.  No evidence of nerve subluxation at bilateral elbow.  Forearm supination and pronation are 85/85 bilaterally.  Wrist flexion/extension is 75/65 bilaterally.  Digital flexion and extension are full.  Thumb opposition is full to the base of the small fingers bilaterally.    Palpable mass over the ulnar aspect of the left forearm, distal to the elbow joint, mobile with some firmness beneath the skin, minimally tender, no skin discoloration, measures approximately 2 cm x 2 cm   Neurologic, Vascular, Motor: Sensation is diminished to light touch in the bilateral median and ulnar nerve distribution.    Thenar atrophy: Positive left, mild right Tinel sign: Positive bilateral cubital tunnel and carpal tunnel Carpal tunnel compression: Positive bilateral Phalen test: Positive bilateral  Motor bilateral hand FPL: 5/5 Index FDP: 5/5 APB: 4/5 bilaterally Positive Froment's bilaterally No significant FDI wasting  Fingers pink and well perfused.  Capillary refill is brisk.     Lab Results  Component Value Date   HGBA1C 8.7 (H) 05/05/2024      Imaging: XR Elbow Complete Right (3+View) Result Date: 06/24/2024 There is no evidence of fracture or dislocation. There is no evidence of arthropathy or other focal bone abnormality. Soft tissues are unremarkable.   XR Elbow Complete Left (3+View) Result Date: 06/24/2024 X-rays of the left elbow demonstrate well located radiocapitellar and ulnohumeral joints without significant bony abnormalities.  There is evidence of calcified mass distal to the elbow region along the proximal ulna, seen dorsally on the lateral view.   Past Medical/Family/Surgical/Social History: Medications & Allergies reviewed per EMR, new medications updated. 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   Lymphedema 03/13/2021   Ulcerated, foot, right, with fat layer exposed (HCC) 08/18/2020   Abnormal barium swallow    Nonproliferative diabetic retinopathy (HCC)    Globus pharyngeus 02/24/2018   Laryngopharyngeal reflux (LPR) 02/24/2018   Paroxysmal tachycardia (HCC) 01/07/2018   Morbid obesity (HCC) 01/13/2017   Pure hypercholesterolemia 01/11/2017   Localized edema 01/11/2017   Diabetic retinopathy (HCC) 05/29/2016   Abdominal pain 03/28/2014   Type 2 diabetes mellitus with complications (HCC) 03/28/2014   Weakness 07/26/2013   Hyperlipidemia    Essential hypertension    Shortness of breath 03/27/2012   Edema 03/27/2012   Near syncope 03/27/2012   Palpitations 03/27/2012   Past Medical History:  Diagnosis Date   Arthritis    feet & toes   Bulging disc    Carpal tunnel syndrome    right > left 10/25   Diabetes mellitus    Gastroparesis    Hyperlipidemia    Hypertension    Neuromuscular disorder (HCC)    in feet and fingers    Neuropathy    feet   Nonproliferative diabetic retinopathy (HCC)    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     Peripheral vascular disease  Shortness of breath dyspnea    on lopressor     Sleep apnea    pt had study done but it was incomplete due to blood sugar issues    Ulnar neuropathy    severe left, moderate right (10/25)   Family History  Problem Relation Age of Onset   Hyperlipidemia Mother    Heart disease Father    Hyperlipidemia Father    Hypertension Father    Kidney disease Sister    Kidney cancer Sister    Ovarian cancer Maternal Aunt    Kidney disease Maternal Aunt        x 2   Kidney disease Maternal Uncle        x 2   Heart disease Brother    Stroke Brother    Heart disease Brother    Colon cancer Neg Hx    Stomach cancer Neg Hx    Breast  cancer Neg Hx    Past Surgical History:  Procedure Laterality Date   BIOPSY  04/28/2018   Procedure: BIOPSY;  Surgeon: Aneita Gwendlyn DASEN, MD;  Location: [facility];  Service: Endoscopy;;   BIOPSY THYROID      january 2016   ESOPHAGOGASTRODUODENOSCOPY (EGD) WITH PROPOFOL  N/A 04/28/2018   Procedure: ESOPHAGOGASTRODUODENOSCOPY (EGD) WITH PROPOFOL ;  Surgeon: Aneita Gwendlyn DASEN, MD;  Location: [facility];  Service: Endoscopy;  Laterality: N/A;   HYSTEROSCOPY WITH D & C N/A 02/15/2015   Procedure: DILATATION AND CURETTAGE /HYSTEROSCOPY;  Surgeon: Duwaine Blumenthal, DO;  Location: [facility];  Service: Gynecology;  Laterality: N/A;   TONSILLECTOMY     Social History   Occupational History   Occupation: retired  Tobacco Use   Smoking status: Never   Smokeless tobacco: Never  Vaping Use   Vaping status: Never Used  Substance and Sexual Activity   Alcohol use: No    Comment: social drinker.   Drug use: No   Sexual activity: Not Currently    Marlaysia Lenig Estela) Arlinda, M.D. [facility], Hand Surgery

## 2024-06-28 ENCOUNTER — Other Ambulatory Visit: Payer: Self-pay | Admitting: Family Medicine

## 2024-06-28 DIAGNOSIS — Z1231 Encounter for screening mammogram for malignant neoplasm of breast: Secondary | ICD-10-CM

## 2024-06-28 NOTE — Progress Notes (Unsigned)
 Cardiology Office Note  Date:  06/29/2024   ID:  Briggs, [name] [birth year], MRN [id number]  PCP:  Duanne Butler DASEN, MD   Chief Complaint  Patient presents with   12 month follow up    The patient denies SOB or chest pain.    HPI:  Linda Briggs is a very pleasant 68 year old woman with  diabetes, on insulin    Episodes of sinus tachycardia morbid obesity,  hyperlipidemia,  hypertension,  chronic lower extremity swelling,  Lymphedema, has pumps dizziness.   Neuropathy in her feet Mom with dementia, lives with her Covid 12/21 She presents for routine followup of her hypertension,  leg swelling, sinus tachycardia  Last seen by myself in clinic 4/24 Mother passed, GI perf Sister passed: Has younger brother, CAD, CABG  Uses a cane, no falls Has a walker at home Needs surgery, left arm pinched nerve   Drinks soda at home, trying to cut back  Followed by Dr. Marea for leg swelling/lymphedema Not using her lymphedema compression pumps   Continues to take metoprolol  tartrate 25 twice daily Skipping some lasix  here and there,  can gets cramps if she takes Lasix  daily  Lab work reviewed A1C 8.7 Total chol 166 LDL 109  carotid ultrasound showing minimal disease.   EKG personally reviewed by myself on todays visit EKG Interpretation Date/Time:  Tuesday June 29 2024 14:50:08 EDT Ventricular Rate:  65 PR Interval:  242 QRS Duration:  84 QT Interval:  420 QTC Calculation: 436 R Axis:   -27  Text Interpretation: Sinus rhythm with 1st degree A-V block When compared with ECG of 03-Feb-2014 17:03, No significant change was found Confirmed by Perla Lye [phone]) on 06/29/2024 3:02:01 PM    Other past medical hx reviewed  Workup for covid 2021 08/31/20 Pharmacological myocardial perfusion imaging study with no significant  ischemia Normal wall motion, EF estimated at 78% No EKG changes concerning for ischemia at peak stress or in recovery. CT attenuation  correction images with mild aortic atherosclerosis, minimal coronary calcification Low risk scan  Echo 08/16/20 1. Left ventricular ejection fraction, by estimation, is 55 to 60%. The  left ventricle has normal function. The left ventricle has no regional  wall motion abnormalities. Left ventricular diastolic parameters are  consistent with Grade II diastolic  dysfunction (pseudonormalization).   2. Right ventricular systolic function is normal. The right ventricular  size is normal. There is normal pulmonary artery systolic pressure.   3. The pericardial effusion is posterior and lateral to the left  ventricle.   4. The mitral valve is grossly normal. No evidence of mitral valve  regurgitation.   5. The aortic valve was not well visualized. Aortic valve regurgitation  is not visualized.   6. The inferior vena cava is normal in size with greater than 50%  respiratory variability, suggesting right atrial pressure of 3 mmHg.    Holter monitor in the past that showed elevated baseline heart rate with frequent periods of sinus tachycardia. Improvement in dizzy episodes and fluttering with metoprolol .   PMH:   has a past medical history of Arthritis, Bulging disc, Carpal tunnel syndrome, Diabetes mellitus, Gastroparesis, Hyperlipidemia, Hypertension, Neuromuscular disorder (HCC), Neuropathy, Nonproliferative diabetic retinopathy (HCC), Periodic heart flutter, Peripheral vascular disease, Shortness of breath dyspnea, Sleep apnea, and Ulnar neuropathy.  PSH:    Past Surgical History:  Procedure Laterality Date   BIOPSY  04/28/2018   Procedure: BIOPSY;  Surgeon: Aneita Gwendlyn DASEN, MD;  Location: [facility];  Service: Endoscopy;;  BIOPSY THYROID      january 2016   ESOPHAGOGASTRODUODENOSCOPY (EGD) WITH PROPOFOL  N/A 04/28/2018   Procedure: ESOPHAGOGASTRODUODENOSCOPY (EGD) WITH PROPOFOL ;  Surgeon: Aneita Gwendlyn DASEN, MD;  Location: [facility];  Service: Endoscopy;  Laterality: N/A;    HYSTEROSCOPY WITH D & C N/A 02/15/2015   Procedure: DILATATION AND CURETTAGE /HYSTEROSCOPY;  Surgeon: Duwaine Blumenthal, DO;  Location: [facility];  Service: Gynecology;  Laterality: N/A;   TONSILLECTOMY      Outpatient Encounter Medications as of 06/29/2024  Medication Sig   ACCU-CHEK GUIDE TEST test strip TEST BLOOD SUGAR THREE TIMES DAILY   Accu-Chek Softclix Lancets lancets TEST BLOOD SUGAR THREE TIMES DAILY   Alcohol Swabs (DROPSAFE ALCOHOL PREP) 70 % PADS USE AS DIRECTED THREE TIMES DAILY   Blood Glucose Calibration (TRUE METRIX LEVEL 2) Normal SOLN Use as directed to monitor FSBS 3x daily. Dx: E11.65   Blood Glucose Monitoring Suppl DEVI 1 each by Does not apply route in the morning, at noon, and at bedtime. May substitute to any manufacturer covered by patient's insurance.   cholecalciferol (VITAMIN D3) 25 MCG (1000 UNIT) tablet Take 1,000 Units by mouth daily.   cyanocobalamin  (VITAMIN B12) 1000 MCG tablet Take 1,000 mcg by mouth daily.   furosemide  (LASIX ) 20 MG tablet TAKE 1 TABLET EVERY DAY   gabapentin  (NEURONTIN ) 300 MG capsule TAKE 1 CAPSULE THREE TIMES DAILY   glipiZIDE  (GLIPIZIDE  XL) 10 MG 24 hr tablet Take 1 tablet (10 mg total) by mouth daily with breakfast.   metFORMIN  (GLUCOPHAGE ) 1000 MG tablet TAKE 1 TABLET TWICE DAILY WITH MEALS   metoprolol  tartrate (LOPRESSOR ) 50 MG tablet TAKE 1/2 TABLET TWICE DAILY   MOUNJARO  2.5 MG/0.5ML Pen INJECT 1/2 (ONE-HALF) ML SUBCUTANEOUSLY  ONCE A WEEK   ondansetron  (ZOFRAN ) 4 MG tablet TAKE 1 TABLET (4 MG TOTAL) BY MOUTH EVERY 8 (EIGHT) HOURS AS NEEDED FOR NAUSEA OR VOMITING.   pantoprazole  (PROTONIX ) 40 MG tablet TAKE 1 TABLET TWICE DAILY BEFORE MEALS   pioglitazone  (ACTOS ) 30 MG tablet Take 30 mg by mouth daily.   potassium chloride  (KLOR-CON  M) 10 MEQ tablet TAKE 1 TABLET EVERY DAY (NEED MD APPOINTMENT)   pravastatin  (PRAVACHOL ) 40 MG tablet TAKE 1 TABLET EVERY DAY   traMADol  (ULTRAM ) 50 MG tablet TAKE 1 TABLET BY MOUTH EVERY 6 HOURS AS NEEDED  FOR  CHRONIC  BACK  PAIN   aspirin EC 81 MG tablet Take 81 mg by mouth daily. (Patient not taking: Reported on 06/29/2024)   Multiple Vitamin (MULTIVITAMIN) tablet Take 1 tablet by mouth daily. (Patient not taking: Reported on 06/29/2024)   [DISCONTINUED] pantoprazole  (PROTONIX ) 40 MG tablet TAKE 1 TABLET TWICE DAILY BEFORE MEALS   No facility-administered encounter medications on file as of 06/29/2024.   Allergies:   Codeine, Penicillins, Cayenne, Ciprofloxacin , Food, Januvia [sitagliptin], and Latex   Social History:  The patient  reports that she has never smoked. She has never used smokeless tobacco. She reports that she does not drink alcohol and does not use drugs.   Family History:   family history includes Heart disease in her brother, brother, and father; Hyperlipidemia in her father and mother; Hypertension in her father; Kidney cancer in her sister; Kidney disease in her maternal aunt, maternal uncle, and sister; Ovarian cancer in her maternal aunt; Stroke in her brother.    Review of Systems: Review of Systems  Constitutional: Negative.   HENT: Negative.    Respiratory: Negative.    Cardiovascular:  Positive for leg swelling.  Gastrointestinal: Negative.  Musculoskeletal: Negative.        Leg weakness, balance instability  Neurological: Negative.   Psychiatric/Behavioral: Negative.    All other systems reviewed and are negative.   PHYSICAL EXAM: VS:  BP 130/72 (BP Location: Left Arm, Patient Position: Sitting)   Pulse 65   Wt (!) 300 lb 6.4 oz (136.3 kg)   SpO2 96%   BMI 48.49 kg/m  , BMI Body mass index is 48.49 kg/m. Constitutional:  oriented to person, place, and time. No distress.  HENT:  Head: Grossly normal Eyes:  no discharge. No scleral icterus.  Neck: No JVD, no carotid bruits  Cardiovascular: Regular rate and rhythm, no murmurs appreciated Chronic woody leg edema bilaterally Pulmonary/Chest: Clear to auscultation bilaterally, no wheezes or  rails Abdominal: Soft.  no distension.  no tenderness.  Musculoskeletal: Normal range of motion Neurological:  normal muscle tone. Coordination normal. No atrophy Skin: Skin warm and dry Psychiatric: normal affect, pleasant   Recent Labs: 05/05/2024: ALT 14; BUN 11; Creat 0.86; Hemoglobin 12.5; Platelets 134; Potassium 4.3; Sodium 140    Lipid Panel Lab Results  Component Value Date   CHOL 166 05/05/2024   HDL 30 (L) 05/05/2024   LDLCALC 109 (H) 05/05/2024   TRIG 157 (H) 05/05/2024      Wt Readings from Last 3 Encounters:  06/29/24 (!) 300 lb 6.4 oz (136.3 kg)  06/16/24 298 lb (135.2 kg)  05/27/24 298 lb (135.2 kg)      ASSESSMENT AND PLAN:  Shortness of breath - Morbid obesity, deconditioning Recommend continued calorie restriction, walking program as tolerated On Mounjaro   Type 2 diabetes mellitus with complication, with long-term current use of insulin  (HCC) - Low carbohydrate diet, she is working with primary care to get A1c down A1c up from 6 now running 8 range Recommended avoiding sweet tea, sodas Continue lisinopril , blood pressure stable  Paroxysmal tachycardia Is well-controlled on metoprolol  tartrate 25 twice daily  Essential hypertension - Plan: EKG 12-Lead Blood pressure is well controlled on today's visit. No changes made to the medications.  Pure hypercholesterolemia - Plan: EKG 12-Lead Stay on pravastain, recommend she add Zetia 10 mg daily  Localized edema - Plan: EKG 12-Lead Chronic venous stasis/lymphedema Does not use her lymphedema compression pumps on a regular basis.  Strongly recommended she start reusing the pumps in effort to avoid ulcerations and risk of infection  Morbid obesity (HCC) Unable to exercise Stressed importance of low carbohydrates    Orders Placed This Encounter  Procedures   EKG 12-Lead     Signed, Velinda Lunger, M.D., Ph.D. 06/29/2024  [facility] [place], [place] [phone]

## 2024-06-29 ENCOUNTER — Ambulatory Visit: Attending: Cardiovascular Disease | Admitting: Cardiovascular Disease

## 2024-06-29 ENCOUNTER — Encounter

## 2024-06-29 VITALS — BP 130/72 | HR 65 | Wt 300.4 lb

## 2024-06-29 DIAGNOSIS — R0609 Other forms of dyspnea: Secondary | ICD-10-CM

## 2024-06-29 DIAGNOSIS — I890 Lymphedema, not elsewhere classified: Secondary | ICD-10-CM

## 2024-06-29 DIAGNOSIS — E782 Mixed hyperlipidemia: Secondary | ICD-10-CM

## 2024-06-29 DIAGNOSIS — R000 Tachycardia, unspecified: Secondary | ICD-10-CM

## 2024-06-29 DIAGNOSIS — I10 Essential (primary) hypertension: Secondary | ICD-10-CM

## 2024-06-29 MED ORDER — EZETIMIBE 10 MG PO TABS: 10.0000 mg | ORAL_TABLET | Freq: Every day | ORAL | 3 refills | Status: AC

## 2024-06-29 NOTE — Patient Instructions (Signed)
 Medication Instructions:  ?Please start zetia 10 mg daily  ? ?If you need a refill on your cardiac medications before your next appointment, please call your pharmacy.  ? ?Lab work: ?No new labs needed ? ?Testing/Procedures: ?No new testing needed ? ?Follow-Up: ?At [facility], you and your health needs are our priority.  As part of our continuing mission to provide you with exceptional heart care, we have created designated Provider Care Teams.  These Care Teams include your primary Cardiologist (physician) and Advanced Practice Providers (APPs -  Physician Assistants and Nurse Practitioners) who all work together to provide you with the care you need, when you need it. ? ?You will need a follow up appointment in 12 months ? ?Providers on your designated Care Team:   ?Nicolasa Ducking, NP ?Eula Listen, PA-C ?Cadence Fransico Michael, PA-C ? ?COVID-19 Vaccine Information can be found at: [email or web address] For questions related to vaccine distribution or appointments, please email [email or web address] or call [phone].  ? ?

## 2024-07-05 ENCOUNTER — Encounter: Payer: Self-pay | Admitting: Radiology

## 2024-07-09 ENCOUNTER — Other Ambulatory Visit: Payer: Self-pay | Admitting: Cardiovascular Disease

## 2024-07-09 ENCOUNTER — Other Ambulatory Visit: Payer: Self-pay | Admitting: Family Medicine

## 2024-07-10 NOTE — Telephone Encounter (Signed)
 Requested medication (s) are due for refill today: yes  Requested medication (s) are on the active medication list: yes  Last refill:  09/22/23  Future visit scheduled: yes  Notes to clinic:  unable to attach protocol     Requested Prescriptions  Pending Prescriptions Disp Refills   ACCU-CHEK GUIDE TEST test strip [Pharmacy Med Name: ACCU-CHEK GUIDE In Vitro Strip] 300 strip 3    Sig: TEST BLOOD SUGAR THREE TIMES DAILY     There is no refill protocol information for this order

## 2024-07-13 ENCOUNTER — Encounter: Payer: Self-pay | Admitting: Orthopedic Surgery

## 2024-07-14 ENCOUNTER — Other Ambulatory Visit: Payer: Self-pay | Admitting: Family Medicine

## 2024-07-21 ENCOUNTER — Ambulatory Visit: Admitting: Podiatry

## 2024-07-22 ENCOUNTER — Other Ambulatory Visit: Payer: Self-pay | Admitting: Family Medicine

## 2024-07-24 ENCOUNTER — Ambulatory Visit
Admission: RE | Admit: 2024-07-24 | Discharge: 2024-07-24 | Disposition: A | Discharge status: Home / Self Care | Source: Ambulatory Visit | Attending: Orthopedic Surgery | Admitting: Orthopedic Surgery

## 2024-07-24 DIAGNOSIS — M25521 Pain in right elbow: Secondary | ICD-10-CM

## 2024-07-24 DIAGNOSIS — R2232 Localized swelling, mass and lump, left upper limb: Secondary | ICD-10-CM | POA: Diagnosis not present

## 2024-08-04 ENCOUNTER — Ambulatory Visit: Admitting: Podiatry

## 2024-08-04 DIAGNOSIS — L97521 Non-pressure chronic ulcer of other part of left foot limited to breakdown of skin: Secondary | ICD-10-CM | POA: Diagnosis not present

## 2024-08-05 ENCOUNTER — Other Ambulatory Visit: Payer: Self-pay | Admitting: Family Medicine

## 2024-08-05 DIAGNOSIS — E118 Type 2 diabetes mellitus with unspecified complications: Secondary | ICD-10-CM

## 2024-08-05 DIAGNOSIS — K7581 Nonalcoholic steatohepatitis (NASH): Secondary | ICD-10-CM

## 2024-08-05 NOTE — Progress Notes (Signed)
  Subjective:  Patient ID: Linda Briggs, female    DOB: 12-02-1955,  MRN: [id number]  Chief Complaint  Patient presents with   Wound Check    wound check left bunion area. She is pleased with the healing process    68 y.o. female presents with the above complaint. History confirmed with patient.  Main callus still doing well Objective:  Physical Exam: warm, good capillary refill, normal DP and PT pulses, and diffuse neuropathy, prior ulceration site remains healed has a callus submetatarsal 5 base on the left     ABIs without significant atherosclerosis   Assessment:   1. Ulcer of left foot, limited to breakdown of skin [facility])           Plan:  Patient was evaluated and treated and all questions answered.  Ulcer left foot - Ulcer site remains healed.  Skin here as well as the callus submetatarsal 5 for gently debrided with sharp scalpel to remove the hyperkeratosis.  Moisturizing lotion as needed.  Continue lymphedema treatment.  Return in 2 months    No follow-ups on file.

## 2024-08-09 NOTE — Telephone Encounter (Signed)
 Requested medication (s) are due for refill today: yes  Requested medication (s) are on the active medication list: yes  Last refill:  06/14/24  Future visit scheduled: yes  Notes to clinic:   Medication not assigned to a protocol, review manually.      Requested Prescriptions  Pending Prescriptions Disp Refills   MOUNJARO  2.5 MG/0.5ML Pen [Pharmacy Med Name: Mounjaro  2.5 MG/0.5ML Subcutaneous Solution Auto-injector] 8 mL 0    Sig: INJECT 2.5 MG INTO THE SKIN ONCE WEEKLY     Off-Protocol Failed - 08/09/2024  8:29 AM      Failed - Medication not assigned to a protocol, review manually.      Passed - Valid encounter within last 12 months    Recent Outpatient Visits           3 months ago Type 2 diabetes mellitus with complications [facility])   [place] [facility] Duanne Butler DASEN, MD   9 months ago Type 2 diabetes mellitus with complications [facility])   [place] [facility] Duanne Butler DASEN, MD   1 year ago Type 2 diabetes mellitus with complications [facility])   [facility] Pickard, Butler DASEN, MD   1 year ago NASH (nonalcoholic steatohepatitis)   [place] [facility] Duanne Butler DASEN, MD   2 years ago Left sided sciatica   [place] [facility] Pickard, Butler DASEN, MD

## 2024-08-12 ENCOUNTER — Other Ambulatory Visit: Payer: Self-pay | Admitting: Family Medicine

## 2024-08-24 ENCOUNTER — Ambulatory Visit

## 2024-08-30 ENCOUNTER — Other Ambulatory Visit: Payer: Self-pay | Admitting: Family Medicine

## 2024-09-01 ENCOUNTER — Other Ambulatory Visit

## 2024-09-01 DIAGNOSIS — E782 Mixed hyperlipidemia: Secondary | ICD-10-CM

## 2024-09-01 DIAGNOSIS — Z0000 Encounter for general adult medical examination without abnormal findings: Secondary | ICD-10-CM

## 2024-09-01 DIAGNOSIS — K7581 Nonalcoholic steatohepatitis (NASH): Secondary | ICD-10-CM

## 2024-09-01 DIAGNOSIS — E118 Type 2 diabetes mellitus with unspecified complications: Secondary | ICD-10-CM

## 2024-09-01 DIAGNOSIS — I10 Essential (primary) hypertension: Secondary | ICD-10-CM

## 2024-09-01 DIAGNOSIS — I890 Lymphedema, not elsewhere classified: Secondary | ICD-10-CM

## 2024-09-02 LAB — CBC WITH DIFFERENTIAL/PLATELET
Absolute Lymphocytes: 2636 {cells}/uL (ref 850–3900)
Absolute Monocytes: 465 {cells}/uL (ref 200–950)
Basophils Absolute: 81 {cells}/uL (ref 0–200)
Basophils Relative: 0.8 %
Eosinophils Absolute: 152 {cells}/uL (ref 15–500)
Eosinophils Relative: 1.5 %
HCT: 39.6 % (ref 35.9–46.0)
Hemoglobin: 13.1 g/dL (ref 11.7–15.5)
MCH: 29.6 pg (ref 27.0–33.0)
MCHC: 33.1 g/dL (ref 31.6–35.4)
MCV: 89.4 fL (ref 81.4–101.7)
MPV: 9.9 fL (ref 7.5–12.5)
Monocytes Relative: 4.6 %
Neutro Abs: 6767 {cells}/uL (ref 1500–7800)
Neutrophils Relative %: 67 %
Platelets: 161 Thousand/uL (ref 140–400)
RBC: 4.43 Million/uL (ref 3.80–5.10)
RDW: 13.3 % (ref 11.0–15.0)
Total Lymphocyte: 26.1 %
WBC: 10.1 Thousand/uL (ref 3.8–10.8)

## 2024-09-02 LAB — COMPLETE METABOLIC PANEL WITHOUT GFR
AG Ratio: 1.7 (calc) (ref 1.0–2.5)
ALT: 16 U/L (ref 6–29)
AST: 15 U/L (ref 10–35)
Albumin: 4.3 g/dL (ref 3.6–5.1)
Alkaline phosphatase (APISO): 70 U/L (ref 37–153)
BUN: 17 mg/dL (ref 7–25)
CO2: 31 mmol/L (ref 20–32)
Calcium: 9.4 mg/dL (ref 8.6–10.4)
Chloride: 103 mmol/L (ref 98–110)
Creat: 0.96 mg/dL (ref 0.50–1.05)
Globulin: 2.5 g/dL (ref 1.9–3.7)
Glucose, Bld: 134 mg/dL — ABNORMAL HIGH (ref 65–99)
Potassium: 4.7 mmol/L (ref 3.5–5.3)
Sodium: 142 mmol/L (ref 135–146)
Total Bilirubin: 0.6 mg/dL (ref 0.2–1.2)
Total Protein: 6.8 g/dL (ref 6.1–8.1)

## 2024-09-02 LAB — HEMOGLOBIN A1C
Hgb A1c MFr Bld: 6.5 % — ABNORMAL HIGH
Mean Plasma Glucose: 140 mg/dL
eAG (mmol/L): 7.7 mmol/L

## 2024-09-02 LAB — MICROALBUMIN / CREATININE URINE RATIO
Creatinine, Urine: 60 mg/dL (ref 20–275)
Microalb Creat Ratio: 42 mg/g{creat} — ABNORMAL HIGH
Microalb, Ur: 2.5 mg/dL

## 2024-09-02 LAB — LIPID PANEL
Cholesterol: 132 mg/dL
HDL: 34 mg/dL — ABNORMAL LOW
LDL Cholesterol (Calc): 74 mg/dL
Non-HDL Cholesterol (Calc): 98 mg/dL
Total CHOL/HDL Ratio: 3.9 (calc)
Triglycerides: 154 mg/dL — ABNORMAL HIGH

## 2024-09-02 LAB — VITAMIN B12: Vitamin B-12: 355 pg/mL (ref 200–1100)

## 2024-09-03 ENCOUNTER — Ambulatory Visit: Payer: Self-pay | Admitting: Family Medicine

## 2024-09-09 ENCOUNTER — Encounter: Payer: Self-pay | Admitting: Family Medicine

## 2024-09-09 ENCOUNTER — Ambulatory Visit: Admitting: Family Medicine

## 2024-09-09 DIAGNOSIS — Z7984 Long term (current) use of oral hypoglycemic drugs: Secondary | ICD-10-CM

## 2024-09-09 DIAGNOSIS — I890 Lymphedema, not elsewhere classified: Secondary | ICD-10-CM

## 2024-09-09 DIAGNOSIS — E1140 Type 2 diabetes mellitus with diabetic neuropathy, unspecified: Secondary | ICD-10-CM

## 2024-09-09 DIAGNOSIS — I10 Essential (primary) hypertension: Secondary | ICD-10-CM | POA: Diagnosis not present

## 2024-09-09 DIAGNOSIS — Z7985 Long-term (current) use of injectable non-insulin antidiabetic drugs: Secondary | ICD-10-CM | POA: Diagnosis not present

## 2024-09-09 DIAGNOSIS — Z6841 Body Mass Index (BMI) 40.0 and over, adult: Secondary | ICD-10-CM | POA: Diagnosis not present

## 2024-09-09 MED ORDER — TIRZEPATIDE 7.5 MG/0.5ML ~~LOC~~ SOAJ: 7.5000 mg | SUBCUTANEOUS | 3 refills | Status: DC

## 2024-09-09 NOTE — Progress Notes (Signed)
 "  Subjective:    Patient ID: Linda Briggs, female    DOB: 1956-07-30, 69 y.o.   MRN: [id number] Patient is a 69 year old Caucasian female here today for a follow-up of her diabetes.  Her hemoglobin A1c was greater than 8 at her last visit.  We started the patient on Mounjaro  and she also changed her diet.  She is currently only taking 2.5 mg of Mounjaro  daily.  She denies any nausea or vomiting.  She has not lost any weight.  However her hemoglobin A1c has fallen to 6.5!.  She denies any nausea or vomiting.  She denies any abdominal pain.  She is currently taking Actos  and metformin  in addition to the Mounjaro . Lab on 09/01/2024  Component Date Value Ref Range Status   WBC 09/01/2024 10.1  3.8 - 10.8 Thousand/uL Final   RBC 09/01/2024 4.43  3.80 - 5.10 Million/uL Final   Hemoglobin 09/01/2024 13.1  11.7 - 15.5 g/dL Final   HCT 87/68/7974 39.6  35.9 - 46.0 % Final   MCV 09/01/2024 89.4  81.4 - 101.7 fL Final   MCH 09/01/2024 29.6  27.0 - 33.0 pg Final   MCHC 09/01/2024 33.1  31.6 - 35.4 g/dL Final   RDW 87/68/7974 13.3  11.0 - 15.0 % Final   Platelets 09/01/2024 161  140 - 400 Thousand/uL Final   MPV 09/01/2024 9.9  7.5 - 12.5 fL Final   Neutro Abs 09/01/2024 6,767  1,500 - 7,800 cells/uL Final   Absolute Lymphocytes 09/01/2024 2,636  850 - 3,900 cells/uL Final   Absolute Monocytes 09/01/2024 465  200 - 950 cells/uL Final   Eosinophils Absolute 09/01/2024 152  15 - 500 cells/uL Final   Basophils Absolute 09/01/2024 81  0 - 200 cells/uL Final   Neutrophils Relative % 09/01/2024 67  % Final   Total Lymphocyte 09/01/2024 26.1  % Final   Monocytes Relative 09/01/2024 4.6  % Final   Eosinophils Relative 09/01/2024 1.5  % Final   Basophils Relative 09/01/2024 0.8  % Final   Glucose, Bld 09/01/2024 134 (H)  65 - 99 mg/dL Final   Comment: .            Fasting reference interval . For someone without known diabetes, a glucose value >125 mg/dL indicates that they may have diabetes and this  should be confirmed with a follow-up test. .    BUN 09/01/2024 17  7 - 25 mg/dL Final   Creat 87/68/7974 0.96  0.50 - 1.05 mg/dL Final   BUN/Creatinine Ratio 09/01/2024 SEE NOTE:  6 - 22 (calc) Final   Comment:    Not Reported: BUN and Creatinine are within    reference range. .    Sodium 09/01/2024 142  135 - 146 mmol/L Final   Potassium 09/01/2024 4.7  3.5 - 5.3 mmol/L Final   Chloride 09/01/2024 103  98 - 110 mmol/L Final   CO2 09/01/2024 31  20 - 32 mmol/L Final   Calcium  09/01/2024 9.4  8.6 - 10.4 mg/dL Final   Total Protein 87/68/7974 6.8  6.1 - 8.1 g/dL Final   Albumin 87/68/7974 4.3  3.6 - 5.1 g/dL Final   Globulin 87/68/7974 2.5  1.9 - 3.7 g/dL (calc) Final   AG Ratio 09/01/2024 1.7  1.0 - 2.5 (calc) Final   Total Bilirubin 09/01/2024 0.6  0.2 - 1.2 mg/dL Final   Alkaline phosphatase (APISO) 09/01/2024 70  37 - 153 U/L Final   AST 09/01/2024 15  10 - 35 U/L  Final   ALT 09/01/2024 16  6 - 29 U/L Final   Hgb A1c MFr Bld 09/01/2024 6.5 (H)  <5.7 % Final   Comment: For someone without known diabetes, a hemoglobin A1c value of 6.5% or greater indicates that they may have  diabetes and this should be confirmed with a follow-up  test. . For someone with known diabetes, a value <7% indicates  that their diabetes is well controlled and a value  greater than or equal to 7% indicates suboptimal  control. A1c targets should be individualized based on  duration of diabetes, age, comorbid conditions, and  other considerations. . Currently, no consensus exists regarding use of hemoglobin A1c for diagnosis of diabetes for children. .    Mean Plasma Glucose 09/01/2024 140  mg/dL Final   eAG (mmol/L) 87/68/7974 7.7  mmol/L Final   Cholesterol 09/01/2024 132  <200 mg/dL Final   HDL 87/68/7974 34 (L)  > OR = 50 mg/dL Final   Triglycerides 87/68/7974 154 (H)  <150 mg/dL Final   LDL Cholesterol (Calc) 09/01/2024 74  mg/dL (calc) Final   Comment: Reference range: <100 . Desirable  range <100 mg/dL for primary prevention;   <70 mg/dL for patients with CHD or diabetic patients  with > or = 2 CHD risk factors. SABRA LDL-C is now calculated using the Martin-Hopkins  calculation, which is a validated novel method providing  better accuracy than the Friedewald equation in the  estimation of LDL-C.  Gladis APPLETHWAITE et al. SANDREA. 7986;689(80): 2061-2068  (http://education.[facility].com/faq/FAQ164)    Total CHOL/HDL Ratio 09/01/2024 3.9  <4.9 (calc) Final   Non-HDL Cholesterol (Calc) 09/01/2024 98  <130 mg/dL (calc) Final   Comment: For patients with diabetes plus 1 major ASCVD risk  factor, treating to a non-HDL-C goal of <100 mg/dL  (LDL-C of <29 mg/dL) is considered a therapeutic  option.    Creatinine, Urine 09/01/2024 60  20 - 275 mg/dL Final   Microalb, Ur 87/68/7974 2.5  mg/dL Final   Comment: Reference Range Not established    Microalb Creat Ratio 09/01/2024 42 (H)  <30 mg/g creat Final   Comment: . The ADA defines abnormalities in albumin excretion as follows: SABRA Albuminuria Category        Result (mg/g creatinine) . Normal to Mildly increased   <30 Moderately increased         30-299  Severely increased           > OR = 300 . The ADA recommends that at least two of three specimens collected within a 3-6 month period be abnormal before considering a patient to be within a diagnostic category.    Vitamin B-12 09/01/2024 355  200 - 1,100 pg/mL Final   Comment: . Please Note: Although the reference range for vitamin B12 is [phone] pg/mL, it has been reported that between 5 and 10% of patients with values between 200 and 400 pg/mL may experience neuropsychiatric and hematologic abnormalities due to occult B12 deficiency; less than 1% of patients with values above 400 pg/mL will have symptoms. .     Past Medical History:  Diagnosis Date   Arthritis    feet & toes   Bulging disc    Carpal tunnel syndrome    right > left 10/25   Diabetes mellitus     Gastroparesis    Hyperlipidemia    Hypertension    Neuromuscular disorder (HCC)    in feet and fingers    Neuropathy    feet  Nonproliferative diabetic retinopathy (HCC)    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     Peripheral vascular disease    Shortness of breath dyspnea    on lopressor     Sleep apnea    pt had study done but it was incomplete due to blood sugar issues    Ulnar neuropathy    severe left, moderate right (10/25)   Past Surgical History:  Procedure Laterality Date   BIOPSY  04/28/2018   Procedure: BIOPSY;  Surgeon: Aneita Gwendlyn DASEN, MD;  Location: [facility];  Service: Endoscopy;;   BIOPSY THYROID      january 2016   ESOPHAGOGASTRODUODENOSCOPY (EGD) WITH PROPOFOL  N/A 04/28/2018   Procedure: ESOPHAGOGASTRODUODENOSCOPY (EGD) WITH PROPOFOL ;  Surgeon: Aneita Gwendlyn DASEN, MD;  Location: [facility];  Service: Endoscopy;  Laterality: N/A;   HYSTEROSCOPY WITH D & C N/A 02/15/2015   Procedure: DILATATION AND CURETTAGE /HYSTEROSCOPY;  Surgeon: Duwaine Blumenthal, DO;  Location: [facility];  Service: Gynecology;  Laterality: N/A;   TONSILLECTOMY     Current Outpatient Medications on File Prior to Visit  Medication Sig Dispense Refill   ACCU-CHEK GUIDE TEST test strip TEST BLOOD SUGAR THREE TIMES DAILY 300 strip 3   Accu-Chek Softclix Lancets lancets TEST BLOOD SUGAR THREE TIMES DAILY 300 each 3   Alcohol Swabs (DROPSAFE ALCOHOL PREP) 70 % PADS USE AS DIRECTED THREE TIMES DAILY 300 each 3   Blood Glucose Calibration (TRUE METRIX LEVEL 2) Normal SOLN Use as directed to monitor FSBS 3x daily. Dx: E11.65 1 each 1   Blood Glucose Monitoring Suppl DEVI 1 each by Does not apply route in the morning, at noon, and at bedtime. May substitute to any manufacturer covered by patient's insurance. 1 each 0   cholecalciferol (VITAMIN D3) 25 MCG (1000 UNIT) tablet Take 1,000 Units by mouth daily.     cyanocobalamin  (VITAMIN B12) 1000 MCG tablet Take 1,000 mcg by mouth daily.     ezetimibe  (ZETIA )  10 MG tablet Take 1 tablet (10 mg total) by mouth daily. 90 tablet 3   furosemide  (LASIX ) 20 MG tablet TAKE 1 TABLET EVERY DAY 90 tablet 1   gabapentin  (NEURONTIN ) 300 MG capsule TAKE 1 CAPSULE THREE TIMES DAILY 270 capsule 3   glipiZIDE  (GLIPIZIDE  XL) 10 MG 24 hr tablet Take 1 tablet (10 mg total) by mouth daily with breakfast. 90 tablet 3   metFORMIN  (GLUCOPHAGE ) 1000 MG tablet TAKE 1 TABLET TWICE DAILY WITH MEALS 180 tablet 3   metoprolol  tartrate (LOPRESSOR ) 50 MG tablet TAKE 1/2 TABLET TWICE DAILY (PLEASE KEEP APPOINTMENT ON 10/28 WITH DR PERLA) 180 tablet 3   Multiple Vitamin (MULTIVITAMIN) tablet Take 1 tablet by mouth daily.     ondansetron  (ZOFRAN ) 4 MG tablet TAKE 1 TABLET (4 MG TOTAL) BY MOUTH EVERY 8 (EIGHT) HOURS AS NEEDED FOR NAUSEA OR VOMITING. 60 tablet 3   pantoprazole  (PROTONIX ) 40 MG tablet TAKE 1 TABLET TWICE DAILY BEFORE MEALS 180 tablet 3   pioglitazone  (ACTOS ) 30 MG tablet Take 30 mg by mouth daily.     potassium chloride  (KLOR-CON  M) 10 MEQ tablet TAKE 1 TABLET EVERY DAY (NEED MD APPOINTMENT) 90 tablet 3   pravastatin  (PRAVACHOL ) 40 MG tablet TAKE 1 TABLET EVERY DAY 90 tablet 3   traMADol  (ULTRAM ) 50 MG tablet TAKE 1 TABLET BY MOUTH EVERY 6 HOURS AS NEEDED FOR  CHRONIC  BACK  PAIN 60 tablet 0   aspirin EC 81 MG tablet Take 81 mg by mouth daily. (Patient not taking:  Reported on 09/09/2024)     No current facility-administered medications on file prior to visit.   Allergies  Allergen Reactions   Codeine Anaphylaxis and Hives   Penicillins Anaphylaxis, Hives and Other (See Comments)    Has patient had a PCN reaction causing immediate rash, facial/tongue/throat swelling, SOB or lightheadedness with hypotension: No Has patient had a PCN reaction causing severe rash involving mucus membranes or skin necrosis: No Has patient had a PCN reaction that required hospitalization: No Has patient had a PCN reaction occurring within the last 10 years: No If all of the above answers  are NO, then may proceed with Cephalosporin use.    Cayenne Swelling    Lips swelled   Ciprofloxacin  Other (See Comments)    Tongue Swelling   Food Nausea And Vomiting and Other (See Comments)    Olives    Januvia [Sitagliptin] Other (See Comments)    Stomach cramps   Latex Hives   Social History   Socioeconomic History   Marital status: Single    Spouse name: Not on file   Number of children: 0   Years of education: Not on file   Highest education level: Not on file  Occupational History   Occupation: retired  Tobacco Use   Smoking status: Never   Smokeless tobacco: Never  Vaping Use   Vaping status: Never Used  Substance and Sexual Activity   Alcohol use: No    Comment: social drinker.   Drug use: No   Sexual activity: Not Currently  Other Topics Concern   Not on file  Social History Narrative   Lives alone. 1 brother living.    Social Drivers of Health   Tobacco Use: Low Risk (09/09/2024)   Patient History    Smoking Tobacco Use: Never    Smokeless Tobacco Use: Never    Passive Exposure: Not on file  Financial Resource Strain: Low Risk (05/27/2024)   Overall Financial Resource Strain (CARDIA)    Difficulty of Paying Living Expenses: Not hard at all  Food Insecurity: No Food Insecurity (05/27/2024)   Epic    Worried About Radiation Protection Practitioner of Food in the Last Year: Never true    Ran Out of Food in the Last Year: Never true  Transportation Needs: No Transportation Needs (05/27/2024)   Epic    Lack of Transportation (Medical): No    Lack of Transportation (Non-Medical): No  Physical Activity: Inactive (05/27/2024)   Exercise Vital Sign    Days of Exercise per Week: 0 days    Minutes of Exercise per Session: 0 min  Stress: No Stress Concern Present (05/27/2024)   [employer] of Occupational Health - Occupational Stress Questionnaire    Feeling of Stress: Not at all  Social Connections: Socially Isolated (05/27/2024)   Social Connection and Isolation Panel     Frequency of Communication with Friends and Family: More than three times a week    Frequency of Social Gatherings with Friends and Family: Once a week    Attends Religious Services: Never    Database Administrator or Organizations: No    Attends Banker Meetings: Never    Marital Status: Widowed  Intimate Partner Violence: Not At Risk (05/27/2024)   Epic    Fear of Current or Ex-Partner: No    Emotionally Abused: No    Physically Abused: No    Sexually Abused: No  Depression (PHQ2-9): Medium Risk (05/27/2024)   Depression (PHQ2-9)    PHQ-2 Score: 6  Alcohol Screen: Low Risk (05/27/2024)   Alcohol Screen    Last Alcohol Screening Score (AUDIT): 0  Housing: Unknown (05/27/2024)   Epic    Unable to Pay for Housing in the Last Year: No    Number of Times Moved in the Last Year: Not on file    Homeless in the Last Year: No  Utilities: Not At Risk (05/27/2024)   Epic    Threatened with loss of utilities: No  Health Literacy: Adequate Health Literacy (05/27/2024)   B1300 Health Literacy    Frequency of need for help with medical instructions: Never   Family History  Problem Relation Age of Onset   Hyperlipidemia Mother    Heart disease Father    Hyperlipidemia Father    Hypertension Father    Kidney disease Sister    Kidney cancer Sister    Ovarian cancer Maternal Aunt    Kidney disease Maternal Aunt        x 2   Kidney disease Maternal Uncle        x 2   Heart disease Brother    Stroke Brother    Heart disease Brother    Colon cancer Neg Hx    Stomach cancer Neg Hx    Breast cancer Neg Hx       Review of Systems  Neurological:  Positive for headaches.  All other systems reviewed and are negative.      Objective:   Physical Exam Vitals reviewed.  Constitutional:      General: She is not in acute distress.    Appearance: She is well-developed. She is not diaphoretic.  HENT:     Head: Normocephalic and atraumatic.     Right Ear: External ear normal.      Left Ear: External ear normal.     Nose: Nose normal.     Mouth/Throat:     Pharynx: No oropharyngeal exudate.  Eyes:     General: No scleral icterus.       Right eye: No discharge.        Left eye: No discharge.     Conjunctiva/sclera: Conjunctivae normal.     Pupils: Pupils are equal, round, and reactive to light.  Neck:     Thyroid : No thyromegaly.     Vascular: No JVD.     Trachea: No tracheal deviation.  Cardiovascular:     Rate and Rhythm: Normal rate and regular rhythm.     Heart sounds: Normal heart sounds. No murmur heard.    No friction rub. No gallop.  Pulmonary:     Effort: Pulmonary effort is normal. No respiratory distress.     Breath sounds: Normal breath sounds. No stridor. No wheezing or rales.  Chest:     Chest wall: No tenderness.  Abdominal:     General: Bowel sounds are normal. There is no distension.     Palpations: Abdomen is soft. There is no mass.     Tenderness: There is no abdominal tenderness. There is no guarding or rebound.  Musculoskeletal:        General: No tenderness. Normal range of motion.     Cervical back: Normal range of motion and neck supple.     Right lower leg: Edema present.     Left lower leg: Edema present.  Feet:     Left foot:     Skin integrity: Ulcer present.  Lymphadenopathy:     Cervical: No cervical adenopathy.  Skin:    General: Skin  is warm.     Coloration: Skin is not pale.     Findings: No erythema or rash.  Neurological:     Mental Status: She is alert and oriented to person, place, and time.     Cranial Nerves: No cranial nerve deficit.     Motor: No abnormal muscle tone.     Coordination: Coordination normal.     Deep Tendon Reflexes: Reflexes are normal and symmetric.  Psychiatric:        Behavior: Behavior normal.        Thought Content: Thought content normal.        Judgment: Judgment normal.         Assessment & Plan:   Morbid obesity (HCC)  Lymphedema  Essential  hypertension  Controlled type 2 diabetes mellitus with neuropathy (HCC) Blood pressure today is acceptable. Lab on 09/01/2024  Component Date Value Ref Range Status   WBC 09/01/2024 10.1  3.8 - 10.8 Thousand/uL Final   RBC 09/01/2024 4.43  3.80 - 5.10 Million/uL Final   Hemoglobin 09/01/2024 13.1  11.7 - 15.5 g/dL Final   HCT 87/68/7974 39.6  35.9 - 46.0 % Final   MCV 09/01/2024 89.4  81.4 - 101.7 fL Final   MCH 09/01/2024 29.6  27.0 - 33.0 pg Final   MCHC 09/01/2024 33.1  31.6 - 35.4 g/dL Final   RDW 87/68/7974 13.3  11.0 - 15.0 % Final   Platelets 09/01/2024 161  140 - 400 Thousand/uL Final   MPV 09/01/2024 9.9  7.5 - 12.5 fL Final   Neutro Abs 09/01/2024 6,767  1,500 - 7,800 cells/uL Final   Absolute Lymphocytes 09/01/2024 2,636  850 - 3,900 cells/uL Final   Absolute Monocytes 09/01/2024 465  200 - 950 cells/uL Final   Eosinophils Absolute 09/01/2024 152  15 - 500 cells/uL Final   Basophils Absolute 09/01/2024 81  0 - 200 cells/uL Final   Neutrophils Relative % 09/01/2024 67  % Final   Total Lymphocyte 09/01/2024 26.1  % Final   Monocytes Relative 09/01/2024 4.6  % Final   Eosinophils Relative 09/01/2024 1.5  % Final   Basophils Relative 09/01/2024 0.8  % Final   Glucose, Bld 09/01/2024 134 (H)  65 - 99 mg/dL Final   Comment: .            Fasting reference interval . For someone without known diabetes, a glucose value >125 mg/dL indicates that they may have diabetes and this should be confirmed with a follow-up test. .    BUN 09/01/2024 17  7 - 25 mg/dL Final   Creat 87/68/7974 0.96  0.50 - 1.05 mg/dL Final   BUN/Creatinine Ratio 09/01/2024 SEE NOTE:  6 - 22 (calc) Final   Comment:    Not Reported: BUN and Creatinine are within    reference range. .    Sodium 09/01/2024 142  135 - 146 mmol/L Final   Potassium 09/01/2024 4.7  3.5 - 5.3 mmol/L Final   Chloride 09/01/2024 103  98 - 110 mmol/L Final   CO2 09/01/2024 31  20 - 32 mmol/L Final   Calcium  09/01/2024 9.4  8.6  - 10.4 mg/dL Final   Total Protein 87/68/7974 6.8  6.1 - 8.1 g/dL Final   Albumin 87/68/7974 4.3  3.6 - 5.1 g/dL Final   Globulin 87/68/7974 2.5  1.9 - 3.7 g/dL (calc) Final   AG Ratio 09/01/2024 1.7  1.0 - 2.5 (calc) Final   Total Bilirubin 09/01/2024 0.6  0.2 - 1.2 mg/dL  Final   Alkaline phosphatase (APISO) 09/01/2024 70  37 - 153 U/L Final   AST 09/01/2024 15  10 - 35 U/L Final   ALT 09/01/2024 16  6 - 29 U/L Final   Hgb A1c MFr Bld 09/01/2024 6.5 (H)  <5.7 % Final   Comment: For someone without known diabetes, a hemoglobin A1c value of 6.5% or greater indicates that they may have  diabetes and this should be confirmed with a follow-up  test. . For someone with known diabetes, a value <7% indicates  that their diabetes is well controlled and a value  greater than or equal to 7% indicates suboptimal  control. A1c targets should be individualized based on  duration of diabetes, age, comorbid conditions, and  other considerations. . Currently, no consensus exists regarding use of hemoglobin A1c for diagnosis of diabetes for children. .    Mean Plasma Glucose 09/01/2024 140  mg/dL Final   eAG (mmol/L) 87/68/7974 7.7  mmol/L Final   Cholesterol 09/01/2024 132  <200 mg/dL Final   HDL 87/68/7974 34 (L)  > OR = 50 mg/dL Final   Triglycerides 87/68/7974 154 (H)  <150 mg/dL Final   LDL Cholesterol (Calc) 09/01/2024 74  mg/dL (calc) Final   Comment: Reference range: <100 . Desirable range <100 mg/dL for primary prevention;   <70 mg/dL for patients with CHD or diabetic patients  with > or = 2 CHD risk factors. SABRA LDL-C is now calculated using the Martin-Hopkins  calculation, which is a validated novel method providing  better accuracy than the Friedewald equation in the  estimation of LDL-C.  Gladis APPLETHWAITE et al. SANDREA. 7986;689(80): 2061-2068  (http://education.[facility].com/faq/FAQ164)    Total CHOL/HDL Ratio 09/01/2024 3.9  <4.9 (calc) Final   Non-HDL Cholesterol (Calc)  09/01/2024 98  <130 mg/dL (calc) Final   Comment: For patients with diabetes plus 1 major ASCVD risk  factor, treating to a non-HDL-C goal of <100 mg/dL  (LDL-C of <29 mg/dL) is considered a therapeutic  option.    Creatinine, Urine 09/01/2024 60  20 - 275 mg/dL Final   Microalb, Ur 87/68/7974 2.5  mg/dL Final   Comment: Reference Range Not established    Microalb Creat Ratio 09/01/2024 42 (H)  <30 mg/g creat Final   Comment: . The ADA defines abnormalities in albumin excretion as follows: SABRA Albuminuria Category        Result (mg/g creatinine) . Normal to Mildly increased   <30 Moderately increased         30-299  Severely increased           > OR = 300 . The ADA recommends that at least two of three specimens collected within a 3-6 month period be abnormal before considering a patient to be within a diagnostic category.    Vitamin B-12 09/01/2024 355  200 - 1,100 pg/mL Final   Comment: . Please Note: Although the reference range for vitamin B12 is [phone] pg/mL, it has been reported that between 5 and 10% of patients with values between 200 and 400 pg/mL may experience neuropsychiatric and hematologic abnormalities due to occult B12 deficiency; less than 1% of patients with values above 400 pg/mL will have symptoms. .   Push the dose of Mounjaro  to 5 mg a week.  In 1 month increase to 7.5 mg subcu weekly.  Try to uptitrate the dose as soon as possible to achieve weight loss.  I would like to ultimately see the patient lose at least 50 pounds given  her history of metabolic associated liver disease.  I believe that this would also help with her lymphedema.  Blood pressure today is well-controlled.  Cholesterol is adequately controlled with an LDL of 74.  Recommended the flu shot which the patient declined.  As her sugars improve at the higher doses of Mounjaro , I plan to switch the patient off Actos  to help reduce the peripheral edema.  "

## 2024-09-13 ENCOUNTER — Telehealth: Payer: Self-pay | Admitting: Family Medicine

## 2024-09-13 NOTE — Telephone Encounter (Unsigned)
 Copied from CRM #[id number]. Topic: General - Other >> Sep 13, 2024  8:36 AM Tiffini S wrote: Reason for CRM: Ronal with [facility] [phone] called about the medication for  tirzepatide  (MOUNJARO ) 7.5 MG/0.5ML Pen- patient was prescribed/ taking the tirzepatide  (MOUNJARO ) 2.5 MG/0.5ML dosage previously and they want to confirm if they should fill thetirzepatide (MOUNJARO ) 5.0 MG/0.5ML Pen as the recommend dosage not the 7.5mg   Please call back to advise

## 2024-09-14 ENCOUNTER — Other Ambulatory Visit: Payer: Self-pay

## 2024-09-14 ENCOUNTER — Ambulatory Visit

## 2024-09-14 DIAGNOSIS — E1140 Type 2 diabetes mellitus with diabetic neuropathy, unspecified: Secondary | ICD-10-CM

## 2024-09-14 DIAGNOSIS — K7581 Nonalcoholic steatohepatitis (NASH): Secondary | ICD-10-CM

## 2024-09-14 MED ORDER — TIRZEPATIDE 5 MG/0.5ML ~~LOC~~ SOAJ: 5.0000 mg | SUBCUTANEOUS | 0 refills | Status: AC

## 2024-09-22 NOTE — Telephone Encounter (Signed)
 09/22/24-Corrected order sent. Mjp,lpn

## 2024-09-29 ENCOUNTER — Ambulatory Visit: Admitting: Podiatry

## 2024-10-20 ENCOUNTER — Ambulatory Visit: Admitting: Podiatry

## 2024-10-25 ENCOUNTER — Encounter (INDEPENDENT_AMBULATORY_CARE_PROVIDER_SITE_OTHER): Payer: Medicare HMO | Admitting: Ophthalmology

## 2024-11-09 ENCOUNTER — Ambulatory Visit

## 2024-12-01 ENCOUNTER — Other Ambulatory Visit

## 2024-12-09 ENCOUNTER — Ambulatory Visit: Admitting: Family Medicine

## 2025-03-15 ENCOUNTER — Ambulatory Visit (INDEPENDENT_AMBULATORY_CARE_PROVIDER_SITE_OTHER): Admitting: Vascular Surgery

## 2025-06-02 ENCOUNTER — Ambulatory Visit
